# Patient Record
Sex: Female | Born: 1970 | Race: White | Hispanic: No | Marital: Single | Smoking: Former smoker
Health system: Southern US, Community
[De-identification: ages and names within clinical notes are randomized; demographics above are authoritative.]

## PROBLEM LIST (undated history)

## (undated) DIAGNOSIS — G259 Extrapyramidal and movement disorder, unspecified: Secondary | ICD-10-CM

## (undated) DIAGNOSIS — F1910 Other psychoactive substance abuse, uncomplicated: Secondary | ICD-10-CM

## (undated) DIAGNOSIS — F32A Depression, unspecified: Secondary | ICD-10-CM

## (undated) DIAGNOSIS — M1990 Unspecified osteoarthritis, unspecified site: Secondary | ICD-10-CM

## (undated) DIAGNOSIS — I10 Essential (primary) hypertension: Secondary | ICD-10-CM

## (undated) DIAGNOSIS — Q438 Other specified congenital malformations of intestine: Secondary | ICD-10-CM

## (undated) DIAGNOSIS — F4310 Post-traumatic stress disorder, unspecified: Secondary | ICD-10-CM

## (undated) DIAGNOSIS — G4730 Sleep apnea, unspecified: Secondary | ICD-10-CM

## (undated) DIAGNOSIS — G2581 Restless legs syndrome: Secondary | ICD-10-CM

## (undated) DIAGNOSIS — M797 Fibromyalgia: Secondary | ICD-10-CM

## (undated) DIAGNOSIS — R519 Headache, unspecified: Secondary | ICD-10-CM

## (undated) DIAGNOSIS — K2270 Barrett's esophagus without dysplasia: Secondary | ICD-10-CM

## (undated) DIAGNOSIS — R0600 Dyspnea, unspecified: Secondary | ICD-10-CM

## (undated) DIAGNOSIS — K760 Fatty (change of) liver, not elsewhere classified: Secondary | ICD-10-CM

## (undated) DIAGNOSIS — Q600 Renal agenesis, unilateral: Secondary | ICD-10-CM

## (undated) DIAGNOSIS — E039 Hypothyroidism, unspecified: Secondary | ICD-10-CM

## (undated) DIAGNOSIS — N183 Chronic kidney disease, stage 3 unspecified: Secondary | ICD-10-CM

## (undated) DIAGNOSIS — J449 Chronic obstructive pulmonary disease, unspecified: Secondary | ICD-10-CM

## (undated) DIAGNOSIS — K219 Gastro-esophageal reflux disease without esophagitis: Secondary | ICD-10-CM

## (undated) DIAGNOSIS — M5431 Sciatica, right side: Secondary | ICD-10-CM

## (undated) DIAGNOSIS — R9389 Abnormal findings on diagnostic imaging of other specified body structures: Secondary | ICD-10-CM

## (undated) DIAGNOSIS — R7303 Prediabetes: Secondary | ICD-10-CM

## (undated) DIAGNOSIS — M479 Spondylosis, unspecified: Secondary | ICD-10-CM

## (undated) HISTORY — DX: Spondylosis, unspecified: M47.9

## (undated) HISTORY — DX: Hypothyroidism, unspecified: E03.9

## (undated) HISTORY — DX: Post-traumatic stress disorder, unspecified: F43.10

## (undated) HISTORY — DX: Barrett's esophagus without dysplasia: K22.70

## (undated) HISTORY — PX: CARPAL TUNNEL RELEASE: SHX101

## (undated) HISTORY — DX: Essential (primary) hypertension: I10

## (undated) HISTORY — DX: Chronic kidney disease, stage 3 unspecified: N18.30

## (undated) HISTORY — PX: SHOULDER SURGERY: SHX246

## (undated) HISTORY — PX: TUBAL LIGATION: SHX77

## (undated) HISTORY — DX: Abnormal findings on diagnostic imaging of other specified body structures: R93.89

## (undated) HISTORY — DX: Unspecified osteoarthritis, unspecified site: M19.90

## (undated) HISTORY — DX: Extrapyramidal and movement disorder, unspecified: G25.9

## (undated) HISTORY — PX: ECTOPIC PREGNANCY SURGERY: SHX613

## (undated) HISTORY — DX: Other specified congenital malformations of intestine: Q43.8

---

## 2003-12-07 ENCOUNTER — Emergency Department (HOSPITAL_COMMUNITY): Admission: EM | Admit: 2003-12-07 | Discharge: 2003-12-07 | Payer: Self-pay | Admitting: Emergency Medicine

## 2004-04-22 ENCOUNTER — Emergency Department (HOSPITAL_COMMUNITY): Admission: EM | Admit: 2004-04-22 | Discharge: 2004-04-22 | Payer: Self-pay | Admitting: Emergency Medicine

## 2004-05-27 ENCOUNTER — Emergency Department (HOSPITAL_COMMUNITY): Admission: EM | Admit: 2004-05-27 | Discharge: 2004-05-27 | Payer: Self-pay | Admitting: *Deleted

## 2005-01-31 ENCOUNTER — Emergency Department (HOSPITAL_COMMUNITY): Admission: EM | Admit: 2005-01-31 | Discharge: 2005-01-31 | Payer: Self-pay | Admitting: *Deleted

## 2005-08-12 ENCOUNTER — Emergency Department (HOSPITAL_COMMUNITY): Admission: EM | Admit: 2005-08-12 | Discharge: 2005-08-12 | Payer: Self-pay | Admitting: Emergency Medicine

## 2005-10-01 ENCOUNTER — Emergency Department (HOSPITAL_COMMUNITY): Admission: EM | Admit: 2005-10-01 | Discharge: 2005-10-01 | Payer: Self-pay | Admitting: Emergency Medicine

## 2006-01-19 ENCOUNTER — Emergency Department (HOSPITAL_COMMUNITY): Admission: EM | Admit: 2006-01-19 | Discharge: 2006-01-19 | Payer: Self-pay | Admitting: Emergency Medicine

## 2006-01-26 ENCOUNTER — Emergency Department (HOSPITAL_COMMUNITY): Admission: EM | Admit: 2006-01-26 | Discharge: 2006-01-26 | Payer: Self-pay | Admitting: Emergency Medicine

## 2009-10-24 ENCOUNTER — Emergency Department (HOSPITAL_BASED_OUTPATIENT_CLINIC_OR_DEPARTMENT_OTHER): Admission: EM | Admit: 2009-10-24 | Discharge: 2009-10-24 | Payer: Self-pay | Admitting: Emergency Medicine

## 2017-05-26 ENCOUNTER — Encounter (HOSPITAL_COMMUNITY): Payer: Self-pay | Admitting: *Deleted

## 2017-05-26 ENCOUNTER — Emergency Department (HOSPITAL_COMMUNITY)
Admission: EM | Admit: 2017-05-26 | Discharge: 2017-05-26 | Disposition: A | Discharge status: Home / Self Care | Payer: Self-pay | Attending: Emergency Medicine | Admitting: Emergency Medicine

## 2017-05-26 ENCOUNTER — Emergency Department (HOSPITAL_COMMUNITY): Payer: Self-pay

## 2017-05-26 DIAGNOSIS — S93491A Sprain of other ligament of right ankle, initial encounter: Secondary | ICD-10-CM

## 2017-05-26 DIAGNOSIS — Y929 Unspecified place or not applicable: Secondary | ICD-10-CM | POA: Insufficient documentation

## 2017-05-26 DIAGNOSIS — S93431A Sprain of tibiofibular ligament of right ankle, initial encounter: Secondary | ICD-10-CM | POA: Insufficient documentation

## 2017-05-26 DIAGNOSIS — X509XXA Other and unspecified overexertion or strenuous movements or postures, initial encounter: Secondary | ICD-10-CM | POA: Insufficient documentation

## 2017-05-26 DIAGNOSIS — Y999 Unspecified external cause status: Secondary | ICD-10-CM | POA: Insufficient documentation

## 2017-05-26 DIAGNOSIS — Y9389 Activity, other specified: Secondary | ICD-10-CM | POA: Insufficient documentation

## 2017-05-26 HISTORY — DX: Restless legs syndrome: G25.81

## 2017-05-26 HISTORY — DX: Sciatica, right side: M54.31

## 2017-05-26 HISTORY — DX: Renal agenesis, unilateral: Q60.0

## 2017-05-26 HISTORY — DX: Gastro-esophageal reflux disease without esophagitis: K21.9

## 2017-05-26 MED ORDER — TRAMADOL HCL 50 MG PO TABS
50.0000 mg | ORAL_TABLET | Freq: Once | ORAL | Status: AC
  Administered 2017-05-26: 50 mg via ORAL
  Filled 2017-05-26: qty 1

## 2017-05-26 MED ORDER — TRAMADOL HCL 50 MG PO TABS: 50.0000 mg | ORAL_TABLET | Freq: Four times a day (QID) | ORAL | 0 refills | Status: DC | PRN

## 2017-05-26 NOTE — ED Triage Notes (Addendum)
Pt c/o right ankle pain that occurred today. Pt was moving stuff and stepped wrong and her ankle rolled out. Obvious swelling noted to right ankle.

## 2017-05-26 NOTE — ED Provider Notes (Signed)
[facility] DEPT Provider Note   CSN: [id number] Arrival date & time: 05/26/17  1349     History   Chief Complaint Chief Complaint  Patient presents with  . Ankle Pain    HPI Briana Wyatt is a 46 y.o. female.  HPI   46 year old female presenting for evaluation of right ankle injury. Patient states today she was moving stuff and stepped wrong and her right ankle rolled out.she did fell down the ground but denies hitting head or loss of consciousness. She is complaining of significant pain to her right ankle, described as sharp, radiates towards her calf, worse with movement. Reports tingling sensation to her toes. Does report some mild right knee pain but no hip pain. She applied ice and came here.  She has sprained the same ankle in the past.    Past Medical History:  Diagnosis Date  . Acid reflux   . Congenital single kidney    absent left  . Restless leg syndrome   . Sciatica of right side     There are no active problems to display for this patient.   Past Surgical History:  Procedure Laterality Date  . ECTOPIC PREGNANCY SURGERY    . TUBAL LIGATION      OB History    No data available       Home Medications    Prior to Admission medications   Not on File    Family History No family history on file.  Social History Social History  Substance Use Topics  . Smoking status: Never Smoker  . Smokeless tobacco: Never Used  . Alcohol use No     Allergies   Codeine and Robaxin [methocarbamol]   Review of Systems Review of Systems  Musculoskeletal: Positive for arthralgias and joint swelling.  Skin: Negative for rash and wound.  Neurological: Negative for numbness.     Physical Exam Updated Vital Signs BP 116/68   Pulse 74   Temp 98.1 F (36.7 C) (Oral)   Resp 20   Ht 5\' 6"  (1.676 m)   Wt 78 kg (172 lb)   SpO2 97%   BMI 27.76 kg/m   Physical Exam  Constitutional: She appears well-developed and well-nourished. No distress.    HENT:  Head: Atraumatic.  Eyes: Conjunctivae are normal.  Neck: Neck supple.  Musculoskeletal: She exhibits tenderness (right ankle: Moderate edema noted to lateral malleolus region with tenderness to palpation but no crepitus. Decreased ankle range of motion secondary to pain. Dorsalis pedis pulse palpable, no pain at fifth metatarsal region.).  Right knee: Mild tenderness to posterior fossa with normal knee flexion and extension and no deformity.  Neurological: She is alert.  Skin: No rash noted.  Psychiatric: She has a normal mood and affect.  Nursing note and vitals reviewed.    ED Treatments / Results  Labs (all labs ordered are listed, but only abnormal results are displayed) Labs Reviewed - No data to display  EKG  EKG Interpretation None       Radiology Dg Ankle Complete Right  Result Date: 05/26/2017 CLINICAL DATA:  Twisted ankle today.  Lateral ankle pain. EXAM: RIGHT ANKLE - COMPLETE 3+ VIEW COMPARISON:  05/26/2013 FINDINGS: The ankle mortise is maintained. No acute ankle fracture or osteochondral lesion. There is a remote rounded avulsion fracture involving the distal tip of the lateral malleolus. The visualized mid and hindfoot bony structures are intact. Large os perineum noted. IMPRESSION: No acute ankle fracture. Electronically Signed   By: Marijo Sanes  M.D.   On: 05/26/2017 14:38    Procedures Procedures (including critical care time)  Medications Ordered in ED Medications - No data to display   Initial Impression / Assessment and Plan / ED Course  I have reviewed the triage vital signs and the nursing notes.  Pertinent labs & imaging results that were available during my care of the patient were reviewed by me and considered in my medical decision making (see chart for details).     BP 116/68   Pulse 74   Temp 98.1 F (36.7 C) (Oral)   Resp 20   Ht 5\' 6"  (1.676 m)   Wt 78 kg (172 lb)   SpO2 97%   BMI 27.76 kg/m    Final Clinical  Impressions(s) / ED Diagnoses   Final diagnoses:  Sprain of anterior talofibular ligament of right ankle, initial encounter    New Prescriptions New Prescriptions   TRAMADOL (ULTRAM) 50 MG TABLET    Take 1 tablet (50 mg total) by mouth every 6 (six) hours as needed.   2:52 PM Patient had a mechanical injury and injured her right ankle. X-ray without acute fractures or dislocation. She suffer from a sprain. She is neurovascularly intact. Will apply ASO brace, crutches, rice therapy discussed, and orthopedic referral given as needed. Since pt is allergic to NSAIDs, tramadol were prescribed. In order to decrease risk of narcotic abuse. Pt's record were checked using the [place] Controlled Substance database.     Domenic Moras, PA-C 05/26/17 1457    Nat Christen, MD 05/29/17 [phone]

## 2020-09-14 ENCOUNTER — Other Ambulatory Visit: Payer: Self-pay

## 2020-09-14 ENCOUNTER — Emergency Department (HOSPITAL_COMMUNITY): Payer: Medicaid - Out of State

## 2020-09-14 ENCOUNTER — Emergency Department (HOSPITAL_COMMUNITY)
Admission: EM | Admit: 2020-09-14 | Discharge: 2020-09-14 | Disposition: A | Discharge status: Home / Self Care | Payer: Medicaid - Out of State | Attending: Emergency Medicine | Admitting: Emergency Medicine

## 2020-09-14 ENCOUNTER — Encounter (HOSPITAL_COMMUNITY): Payer: Self-pay

## 2020-09-14 DIAGNOSIS — F1494 Cocaine use, unspecified with cocaine-induced mood disorder: Secondary | ICD-10-CM | POA: Diagnosis not present

## 2020-09-14 DIAGNOSIS — Z20822 Contact with and (suspected) exposure to covid-19: Secondary | ICD-10-CM | POA: Diagnosis not present

## 2020-09-14 DIAGNOSIS — R440 Auditory hallucinations: Secondary | ICD-10-CM | POA: Diagnosis not present

## 2020-09-14 DIAGNOSIS — Z046 Encounter for general psychiatric examination, requested by authority: Secondary | ICD-10-CM | POA: Diagnosis present

## 2020-09-14 LAB — COMPREHENSIVE METABOLIC PANEL
ALT: 14 U/L (ref 0–44)
AST: 13 U/L — ABNORMAL LOW (ref 15–41)
Albumin: 2.9 g/dL — ABNORMAL LOW (ref 3.5–5.0)
Alkaline Phosphatase: 106 U/L (ref 38–126)
Anion gap: 6 (ref 5–15)
BUN: 18 mg/dL (ref 6–20)
CO2: 24 mmol/L (ref 22–32)
Calcium: 8.2 mg/dL — ABNORMAL LOW (ref 8.9–10.3)
Chloride: 108 mmol/L (ref 98–111)
Creatinine, Ser: 0.89 mg/dL (ref 0.44–1.00)
GFR, Estimated: >60 mL/min (ref >60)
Glucose, Bld: 121 mg/dL — ABNORMAL HIGH (ref 70–99)
Potassium: 3.8 mmol/L (ref 3.5–5.1)
Sodium: 138 mmol/L (ref 135–145)
Total Bilirubin: <0.1 mg/dL — ABNORMAL LOW (ref 0.3–1.2)
Total Protein: 6.1 g/dL — ABNORMAL LOW (ref 6.5–8.1)

## 2020-09-14 LAB — RESP PANEL BY RT-PCR (FLU A&B, COVID) ARPGX2
Influenza A by PCR: NEGATIVE
Influenza B by PCR: NEGATIVE
SARS Coronavirus 2 by RT PCR: NEGATIVE

## 2020-09-14 LAB — CBC WITH DIFFERENTIAL/PLATELET
Abs Immature Granulocytes: 0.04 10*3/uL (ref 0.00–0.07)
Basophils Absolute: 0 10*3/uL (ref 0.0–0.1)
Basophils Relative: 1 %
Eosinophils Absolute: 0.3 10*3/uL (ref 0.0–0.5)
Eosinophils Relative: 5 %
HCT: 36.2 % (ref 36.0–46.0)
Hemoglobin: 11.5 g/dL — ABNORMAL LOW (ref 12.0–15.0)
Immature Granulocytes: 1 %
Lymphocytes Relative: 32 %
Lymphs Abs: 2.1 10*3/uL (ref 0.7–4.0)
MCH: 28.5 pg (ref 26.0–34.0)
MCHC: 31.8 g/dL (ref 30.0–36.0)
MCV: 89.8 fL (ref 80.0–100.0)
Monocytes Absolute: 0.7 10*3/uL (ref 0.1–1.0)
Monocytes Relative: 11 %
Neutro Abs: 3.4 10*3/uL (ref 1.7–7.7)
Neutrophils Relative %: 50 %
Platelets: 260 10*3/uL (ref 150–400)
RBC: 4.03 MIL/uL (ref 3.87–5.11)
RDW: 13.6 % (ref 11.5–15.5)
WBC: 6.5 10*3/uL (ref 4.0–10.5)
nRBC: 0 % (ref 0.0–0.2)

## 2020-09-14 LAB — RAPID URINE DRUG SCREEN, HOSP PERFORMED
Amphetamines: NOT DETECTED
Barbiturates: NOT DETECTED
Benzodiazepines: POSITIVE — AB
Cocaine: POSITIVE — AB
Opiates: NOT DETECTED
Tetrahydrocannabinol: NOT DETECTED

## 2020-09-14 LAB — ETHANOL: Alcohol, Ethyl (B): <10 mg/dL (ref <10)

## 2020-09-14 LAB — POC URINE PREG, ED: Preg Test, Ur: NEGATIVE

## 2020-09-14 MED ORDER — GABAPENTIN 100 MG PO CAPS
200.0000 mg | ORAL_CAPSULE | Freq: Two times a day (BID) | ORAL | Status: DC
  Administered 2020-09-14: 200 mg via ORAL
  Filled 2020-09-14: qty 2

## 2020-09-14 NOTE — ED Triage Notes (Signed)
Pt brought in tonight by [employer], officer Hutchins, with IVC paperwork taken out by pt mother, for hearing voices that are telling her to kill herself. Pt admits to hearing these voices.

## 2020-09-14 NOTE — ED Notes (Signed)
Pt resting comfortably in hall. Safety sitter at bedside.

## 2020-09-14 NOTE — ED Provider Notes (Addendum)
[facility] Emergency Department Provider Note MRN:  [id number]  Arrival date & time: 09/14/20     Chief Complaint   IVC   History of Present Illness   Briana Wyatt is a 50 y.o. year-old female with  presenting to the ED with chief complaint of IVC.  Patient is hearing voices coming out of her mother's ceiling fan.  The voices are telling her to kill her self, with specific plan to turn on the car and die due to carbon monoxide poisoning.  She denies any drugs or alcohol today.  Here under IVC.  Review of Systems  A complete 10 system review of systems was obtained and all systems are negative except as noted in the HPI and PMH.   Patient's Health History    Past Medical History:  Diagnosis Date  . Acid reflux   . Congenital single kidney    absent left  . Restless leg syndrome   . Sciatica of right side     Past Surgical History:  Procedure Laterality Date  . ECTOPIC PREGNANCY SURGERY    . TUBAL LIGATION      No family history on file.  Social History   Socioeconomic History  . Marital status: Single    Spouse name: Not on file  . Number of children: Not on file  . Years of education: Not on file  . Highest education level: Not on file  Occupational History  . Not on file  Tobacco Use  . Smoking status: Never Smoker  . Smokeless tobacco: Never Used  Substance and Sexual Activity  . Alcohol use: No  . Drug use: No  . Sexual activity: Not on file  Other Topics Concern  . Not on file  Social History Narrative  . Not on file   Social Determinants of Health   Financial Resource Strain: Not on file  Food Insecurity: Not on file  Transportation Needs: Not on file  Physical Activity: Not on file  Stress: Not on file  Social Connections: Not on file  Intimate Partner Violence: Not on file     Physical Exam   Vitals:   09/14/20 0412  BP: 118/67  Pulse: 95  Resp: 18  Temp: 97.7 F (36.5 C)  SpO2: 99%    CONSTITUTIONAL:  Well-appearing, NAD NEURO:  Alert and oriented x 3, no focal deficits EYES:  eyes equal and reactive ENT/NECK:  no LAD, no JVD CARDIO: Regular rate, well-perfused, normal S1 and S2 PULM:  CTAB no wheezing or rhonchi GI/GU:  normal bowel sounds, non-distended, non-tender MSK/SPINE:  No gross deformities, no edema SKIN:  no rash, atraumatic PSYCH: Erratic speech and behavior  *Additional and/or pertinent findings included in MDM below  Diagnostic and Interventional Summary    EKG Interpretation  Date/Time:    Ventricular Rate:    PR Interval:    QRS Duration:   QT Interval:    QTC Calculation:   R Axis:     Text Interpretation:        Labs Reviewed  RESP PANEL BY RT-PCR (FLU A&B, COVID) [id number]  COMPREHENSIVE METABOLIC PANEL  ETHANOL  RAPID URINE DRUG SCREEN, HOSP PERFORMED  CBC WITH DIFFERENTIAL/PLATELET  POC URINE PREG, ED    No orders to display    Medications - No data to display   Procedures  /  Critical Care Procedures  ED Course and Medical Decision Making  I have reviewed the triage vital signs, the nursing notes, and pertinent available records  from the EMR.  Listed above are laboratory and imaging tests that I personally ordered, reviewed, and interpreted and then considered in my medical decision making (see below for details).  Concern for acute psychosis, hallucinations.  Under IVC, awaiting TTS recommendations.  No prior documentation of psychiatric illness, 50 years old, will obtain screening labs as well as CT head.  If unremarkable patient would be medically cleared.  Signed out to oncoming provider at shift change.       Barth Kirks. Sedonia Small, [place] [email or web address]  Final Clinical Impressions(s) / ED Diagnoses     ICD-10-CM   1. Auditory hallucinations  R44.0     ED Discharge Orders    None       Discharge Instructions Discussed with and Provided to Patient:   Discharge  Instructions   None       Maudie Flakes, MD 09/14/20 0102    Maudie Flakes, MD 09/14/20 [phone]
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
## 2020-09-14 NOTE — ED Notes (Signed)
Breakfast tray provided. 

## 2020-09-14 NOTE — ED Notes (Signed)
Safety sitter with pt at bedside. Pt calm, cooperative.

## 2020-09-14 NOTE — ED Notes (Signed)
Pt belongings (clothes, shoes and cell phone) placed in bag and locked up.

## 2020-09-14 NOTE — ED Notes (Signed)
No needs at this time. Resting in hall with Air cabin crew.

## 2020-09-14 NOTE — ED Provider Notes (Signed)
Care transferred to me.  Labs have been reviewed and are overall unremarkable besides toxicologic results.  CT head was negative.  At this point, this seems to be a psychiatric issue and per Dr. Sedonia Small, patient's mom indicates she has bipolar.  Patient appears medically stable for psychiatric disposition.   Sherwood Gambler, MD 09/14/20 [phone]

## 2020-09-14 NOTE — ED Provider Notes (Signed)
Patient seen and cleared by Psychiatry, IVC rescinded. Patient amenable to discharge with outpatient follow up.    Truddie Hidden, MD 09/14/20 [phone]

## 2020-09-16 ENCOUNTER — Other Ambulatory Visit: Payer: Self-pay

## 2020-09-16 ENCOUNTER — Emergency Department (HOSPITAL_COMMUNITY)
Admission: EM | Admit: 2020-09-16 | Discharge: 2020-09-17 | Disposition: A | Discharge status: Other Acute Inpatient Hospital | Payer: Medicaid - Out of State | Attending: Emergency Medicine | Admitting: Emergency Medicine

## 2020-09-16 ENCOUNTER — Encounter (HOSPITAL_COMMUNITY): Payer: Self-pay | Admitting: *Deleted

## 2020-09-16 DIAGNOSIS — Z046 Encounter for general psychiatric examination, requested by authority: Secondary | ICD-10-CM | POA: Diagnosis present

## 2020-09-16 DIAGNOSIS — F1494 Cocaine use, unspecified with cocaine-induced mood disorder: Secondary | ICD-10-CM | POA: Diagnosis not present

## 2020-09-16 DIAGNOSIS — F22 Delusional disorders: Secondary | ICD-10-CM | POA: Insufficient documentation

## 2020-09-16 DIAGNOSIS — R45851 Suicidal ideations: Secondary | ICD-10-CM | POA: Insufficient documentation

## 2020-09-16 DIAGNOSIS — Z20822 Contact with and (suspected) exposure to covid-19: Secondary | ICD-10-CM | POA: Insufficient documentation

## 2020-09-16 DIAGNOSIS — F419 Anxiety disorder, unspecified: Secondary | ICD-10-CM | POA: Diagnosis not present

## 2020-09-16 LAB — COMPREHENSIVE METABOLIC PANEL
ALT: 14 U/L (ref 0–44)
AST: 15 U/L (ref 15–41)
Albumin: 3.1 g/dL — ABNORMAL LOW (ref 3.5–5.0)
Alkaline Phosphatase: 115 U/L (ref 38–126)
Anion gap: 11 (ref 5–15)
BUN: 16 mg/dL (ref 6–20)
CO2: 23 mmol/L (ref 22–32)
Calcium: 9 mg/dL (ref 8.9–10.3)
Chloride: 105 mmol/L (ref 98–111)
Creatinine, Ser: 0.96 mg/dL (ref 0.44–1.00)
GFR, Estimated: >60 mL/min (ref >60)
Glucose, Bld: 100 mg/dL — ABNORMAL HIGH (ref 70–99)
Potassium: 4.4 mmol/L (ref 3.5–5.1)
Sodium: 139 mmol/L (ref 135–145)
Total Bilirubin: 0.3 mg/dL (ref 0.3–1.2)
Total Protein: 6.5 g/dL (ref 6.5–8.1)

## 2020-09-16 LAB — ACETAMINOPHEN LEVEL: Acetaminophen (Tylenol), Serum: <10 ug/mL — ABNORMAL LOW (ref 10–30)

## 2020-09-16 LAB — RAPID URINE DRUG SCREEN, HOSP PERFORMED
Amphetamines: NOT DETECTED
Barbiturates: NOT DETECTED
Benzodiazepines: POSITIVE — AB
Cocaine: NOT DETECTED
Opiates: NOT DETECTED
Tetrahydrocannabinol: NOT DETECTED

## 2020-09-16 LAB — CBC
HCT: 39.1 % (ref 36.0–46.0)
Hemoglobin: 12.6 g/dL (ref 12.0–15.0)
MCH: 28.4 pg (ref 26.0–34.0)
MCHC: 32.2 g/dL (ref 30.0–36.0)
MCV: 88.3 fL (ref 80.0–100.0)
Platelets: 290 10*3/uL (ref 150–400)
RBC: 4.43 MIL/uL (ref 3.87–5.11)
RDW: 13.3 % (ref 11.5–15.5)
WBC: 9.2 10*3/uL (ref 4.0–10.5)
nRBC: 0 % (ref 0.0–0.2)

## 2020-09-16 LAB — SALICYLATE LEVEL: Salicylate Lvl: <7 mg/dL — ABNORMAL LOW (ref 7.0–30.0)

## 2020-09-16 LAB — ETHANOL: Alcohol, Ethyl (B): <10 mg/dL (ref <10)

## 2020-09-16 LAB — I-STAT BETA HCG BLOOD, ED (MC, WL, AP ONLY): I-stat hCG, quantitative: <5 m[IU]/mL (ref <5)

## 2020-09-16 MED ORDER — LOSARTAN POTASSIUM 50 MG PO TABS: 25.0000 mg | ORAL_TABLET | Freq: Every day | ORAL | Status: DC

## 2020-09-16 MED ORDER — CYPROHEPTADINE HCL 4 MG PO TABS
4.0000 mg | ORAL_TABLET | Freq: Every day | ORAL | Status: DC
  Administered 2020-09-16: 4 mg via ORAL
  Filled 2020-09-16: qty 1

## 2020-09-16 MED ORDER — BACLOFEN 10 MG PO TABS
10.0000 mg | ORAL_TABLET | Freq: Two times a day (BID) | ORAL | Status: DC
  Administered 2020-09-16: 10 mg via ORAL
  Filled 2020-09-16: qty 1

## 2020-09-16 MED ORDER — CARBAMAZEPINE 200 MG PO TABS
200.0000 mg | ORAL_TABLET | Freq: Every day | ORAL | Status: DC
Start: 2020-09-16 — End: 2020-09-17
  Administered 2020-09-16: 200 mg via ORAL
  Filled 2020-09-16: qty 1

## 2020-09-16 MED ORDER — PANTOPRAZOLE SODIUM 40 MG PO TBEC: 40.0000 mg | DELAYED_RELEASE_TABLET | Freq: Every day | ORAL | Status: DC

## 2020-09-16 MED ORDER — CELECOXIB 100 MG PO CAPS
100.0000 mg | ORAL_CAPSULE | Freq: Two times a day (BID) | ORAL | Status: DC
Start: 2020-09-16 — End: 2020-09-17
  Administered 2020-09-16: 100 mg via ORAL
  Filled 2020-09-16: qty 1

## 2020-09-16 MED ORDER — PREGABALIN 50 MG PO CAPS
100.0000 mg | ORAL_CAPSULE | Freq: Two times a day (BID) | ORAL | Status: DC
Start: 2020-09-16 — End: 2020-09-17
  Administered 2020-09-16: 100 mg via ORAL
  Filled 2020-09-16: qty 1

## 2020-09-16 MED ORDER — DULOXETINE HCL 60 MG PO CPEP
60.0000 mg | ORAL_CAPSULE | Freq: Two times a day (BID) | ORAL | Status: DC
  Administered 2020-09-16: 60 mg via ORAL
  Filled 2020-09-16: qty 1

## 2020-09-16 NOTE — ED Triage Notes (Signed)
Pt was seen at [facility] on 1/22 for same. Reports hearing a voice that is telling her to harm herself but she did not act on it. Pt wants somewhere to stay for a few days and get meds balanced and feeling better. Reports high anxiety and stress. Cooperative at triage.

## 2020-09-17 ENCOUNTER — Encounter (HOSPITAL_COMMUNITY): Payer: Self-pay

## 2020-09-17 ENCOUNTER — Other Ambulatory Visit: Payer: Self-pay

## 2020-09-17 ENCOUNTER — Ambulatory Visit (HOSPITAL_COMMUNITY)
Admission: EM | Admit: 2020-09-17 | Discharge: 2020-09-17 | Disposition: A | Discharge status: Home / Self Care | Payer: Medicaid - Out of State | Source: Home / Self Care

## 2020-09-17 DIAGNOSIS — F331 Major depressive disorder, recurrent, moderate: Secondary | ICD-10-CM

## 2020-09-17 DIAGNOSIS — Z20822 Contact with and (suspected) exposure to covid-19: Secondary | ICD-10-CM | POA: Insufficient documentation

## 2020-09-17 DIAGNOSIS — Z6379 Other stressful life events affecting family and household: Secondary | ICD-10-CM | POA: Insufficient documentation

## 2020-09-17 DIAGNOSIS — R45851 Suicidal ideations: Secondary | ICD-10-CM | POA: Insufficient documentation

## 2020-09-17 DIAGNOSIS — Z560 Unemployment, unspecified: Secondary | ICD-10-CM | POA: Insufficient documentation

## 2020-09-17 DIAGNOSIS — F4323 Adjustment disorder with mixed anxiety and depressed mood: Secondary | ICD-10-CM

## 2020-09-17 DIAGNOSIS — Z9151 Personal history of suicidal behavior: Secondary | ICD-10-CM | POA: Insufficient documentation

## 2020-09-17 DIAGNOSIS — Z79899 Other long term (current) drug therapy: Secondary | ICD-10-CM | POA: Insufficient documentation

## 2020-09-17 LAB — LIPID PANEL
Cholesterol: 213 mg/dL — ABNORMAL HIGH (ref 0–200)
HDL: 67 mg/dL (ref >40)
LDL Cholesterol: 107 mg/dL — ABNORMAL HIGH (ref 0–99)
Total CHOL/HDL Ratio: 3.2 RATIO
Triglycerides: 195 mg/dL — ABNORMAL HIGH (ref <150)
VLDL: 39 mg/dL (ref 0–40)

## 2020-09-17 LAB — SARS CORONAVIRUS 2 BY RT PCR (HOSPITAL ORDER, PERFORMED IN ~~LOC~~ HOSPITAL LAB): SARS Coronavirus 2: NEGATIVE

## 2020-09-17 LAB — TSH: TSH: 0.807 u[IU]/mL (ref 0.350–4.500)

## 2020-09-17 LAB — CARBAMAZEPINE LEVEL, TOTAL: Carbamazepine Lvl: 5.5 ug/mL (ref 4.0–12.0)

## 2020-09-17 LAB — HEMOGLOBIN A1C
Hgb A1c MFr Bld: 5.9 % — ABNORMAL HIGH (ref 4.8–5.6)
Mean Plasma Glucose: 122.63 mg/dL

## 2020-09-17 MED ORDER — CARBAMAZEPINE 200 MG PO TABS: 200.0000 mg | ORAL_TABLET | Freq: Every day | ORAL | Status: DC

## 2020-09-17 MED ORDER — CARBAMAZEPINE 200 MG PO TABS: 200.0000 mg | ORAL_TABLET | Freq: Two times a day (BID) | ORAL | 0 refills | Status: DC

## 2020-09-17 MED ORDER — CARBAMAZEPINE 200 MG PO TABS
200.0000 mg | ORAL_TABLET | Freq: Two times a day (BID) | ORAL | Status: DC
  Administered 2020-09-17: 200 mg via ORAL
  Filled 2020-09-17: qty 14
  Filled 2020-09-17: qty 1

## 2020-09-17 MED ORDER — DULOXETINE HCL 60 MG PO CPEP
60.0000 mg | ORAL_CAPSULE | Freq: Two times a day (BID) | ORAL | Status: DC
  Administered 2020-09-17: 60 mg via ORAL
  Filled 2020-09-17 (×2): qty 1

## 2020-09-17 MED ORDER — MAGNESIUM HYDROXIDE 400 MG/5ML PO SUSP: 30.0000 mL | Freq: Every day | ORAL | Status: DC | PRN

## 2020-09-17 MED ORDER — PREGABALIN 100 MG PO CAPS
100.0000 mg | ORAL_CAPSULE | Freq: Two times a day (BID) | ORAL | Status: DC
  Administered 2020-09-17: 100 mg via ORAL
  Filled 2020-09-17 (×2): qty 1

## 2020-09-17 MED ORDER — LOSARTAN POTASSIUM 50 MG PO TABS
25.0000 mg | ORAL_TABLET | Freq: Every day | ORAL | Status: DC
  Administered 2020-09-17: 25 mg via ORAL
  Filled 2020-09-17: qty 1

## 2020-09-17 MED ORDER — ACETAMINOPHEN 325 MG PO TABS: 650.0000 mg | ORAL_TABLET | Freq: Four times a day (QID) | ORAL | Status: DC | PRN

## 2020-09-17 MED ORDER — LORATADINE 10 MG PO TABS
10.0000 mg | ORAL_TABLET | Freq: Every day | ORAL | Status: DC
  Administered 2020-09-17: 10 mg via ORAL
  Filled 2020-09-17: qty 1

## 2020-09-17 MED ORDER — BACLOFEN 10 MG PO TABS
10.0000 mg | ORAL_TABLET | Freq: Two times a day (BID) | ORAL | Status: DC
  Administered 2020-09-17: 10 mg via ORAL
  Filled 2020-09-17 (×2): qty 1

## 2020-09-17 MED ORDER — PANTOPRAZOLE SODIUM 40 MG PO TBEC
40.0000 mg | DELAYED_RELEASE_TABLET | Freq: Every day | ORAL | Status: DC
  Administered 2020-09-17: 40 mg via ORAL
  Filled 2020-09-17: qty 1

## 2020-09-17 MED ORDER — ALUM & MAG HYDROXIDE-SIMETH 200-200-20 MG/5ML PO SUSP: 30.0000 mL | ORAL | Status: DC | PRN
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
## 2020-09-17 NOTE — ED Notes (Signed)
Discharge instructions provided and Pt stated understanding. Prescriptions and samples given. Personal belongings returned from locker. Pt alert, orient and ambulatory. Safety maintained.

## 2020-09-17 NOTE — ED Notes (Signed)
Pt resting on pull out bed w/o s&s of distress. Safety maintained and will continue to monitor.
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
## 2020-09-17 NOTE — Discharge Instructions (Addendum)

## 2020-09-17 NOTE — ED Provider Notes (Signed)
[facility] EMERGENCY DEPARTMENT Provider Note   CSN: [id number] Arrival date & time: 09/16/20  1639     History Chief Complaint  Patient presents with  . Suicidal  . Medical Clearance    Briana Wyatt is a 50 y.o. female.  50 y/o female with hx of restless leg syndrome, anxiety presents to the ED for psychiatric evaluation. IVC taken out 3 days ago for hallucinations, worsening anxiety. She was discharged with instruction for outpatient follow up, but has not yet reached out to a local provider. Recently relocated to the area 1 month ago from [place]. Has a therapist and psychiatrist there. Patient endorses increased stress lately which is causing her to feel more anxious and have "outbursts". She thinks something bad is going to happen. Patient has been taking out her frustrations on her mother; she does not want this to escalate to the point where she harms anyone though she denies HI or plan. Does endorse suicidal ideations without plan; states she would be too scared to harm herself at the risk of being unsuccessful. No hallucinations since last seen in the ED. Feels she needs to have her current medications evaluated and be stabilized in a hospital setting.        Past Medical History:  Diagnosis Date  . Acid reflux   . Congenital single kidney    absent left  . Restless leg syndrome   . Sciatica of right side     Patient Active Problem List   Diagnosis Date Noted  . 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 ECTOPIC PREGNANCY SURGERY    . TUBAL LIGATION       OB History   No obstetric history on file.     History reviewed. No pertinent family history.  Social History   Tobacco Use  . Smoking status: Never Smoker  . Smokeless tobacco: Never Used  Substance Use Topics  . Alcohol use: No  . Drug use: No    Home Medications Prior to Admission medications   Medication Sig Start  Date End Date Taking? Authorizing Provider  baclofen (LIORESAL) 10 MG tablet Take 10 mg by mouth 2 (two) times daily.   Yes [provider]  carbamazepine (TEGRETOL) 200 MG tablet Take 200 mg by mouth at bedtime.   Yes [provider]  celecoxib (CELEBREX) 100 MG capsule Take 100 mg by mouth 2 (two) times daily.   Yes [provider]  cetirizine (ZYRTEC) 10 MG tablet Take 10 mg by mouth daily. 05/05/20  Yes [provider]  cyproheptadine (PERIACTIN) 4 MG tablet Take 4 mg by mouth at bedtime.   Yes [provider]  DULoxetine (CYMBALTA) 60 MG capsule Take 60 mg by mouth 2 (two) times daily. 02/15/19  Yes [provider]  estradiol-norethindrone (ACTIVELLA) 1-0.5 MG tablet Take 1 tablet by mouth at bedtime. 03/08/20  Yes [provider]  loratadine (CLARITIN) 10 MG tablet Take 10 mg by mouth daily.   Yes [provider]  losartan (COZAAR) 25 MG tablet Take 25 mg by mouth daily.   Yes [provider]  pantoprazole (PROTONIX) 40 MG tablet Take 40 mg by mouth daily.   Yes [provider]  pregabalin (LYRICA) 100 MG capsule Take 100 mg by mouth 2 (two) times daily. 04/15/20  Yes [provider]    Allergies    Codeine, Nsaids, and Robaxin [methocarbamol]  Review of Systems  Review of Systems  Ten systems reviewed and are negative for acute change, except as noted in the HPI.    Physical Exam Updated Vital Signs BP (!) 145/98   Pulse 81   Temp 98.1 F (36.7 C) (Oral)   Resp 18   SpO2 99%   Physical Exam Vitals and nursing note reviewed.  Constitutional:      General: She is not in acute distress.    Appearance: She is well-developed and well-nourished. She is not diaphoretic.     Comments: Nontoxic appearing and in NAD  HENT:     Head: Normocephalic and atraumatic.  Eyes:     General: No scleral icterus.    Extraocular Movements: EOM normal.     Conjunctiva/sclera: Conjunctivae normal.   Pulmonary:     Effort: Pulmonary effort is normal. No respiratory distress.     Comments: Respirations even and unlabored Musculoskeletal:        General: Normal range of motion.     Cervical back: Normal range of motion.  Skin:    General: Skin is warm and dry.     Coloration: Skin is not pale.     Findings: No erythema or rash.  Neurological:     Mental Status: She is alert and oriented to person, place, and time.  Psychiatric:        Mood and Affect: Mood is anxious.        Speech: Speech is rapid and pressured.        Behavior: Behavior is hyperactive. Behavior is cooperative.        Thought Content: Thought content is paranoid. Thought content includes suicidal ideation. Thought content does not include homicidal ideation. Thought content does not include homicidal or suicidal plan.     ED Results / Procedures / Treatments   Labs (all labs ordered are listed, but only abnormal results are displayed) Labs Reviewed  COMPREHENSIVE METABOLIC PANEL - Abnormal; Notable for the following components:      Result Value   Glucose, Bld 100 (*)    Albumin 3.1 (*)    All other components within normal limits  SALICYLATE LEVEL - Abnormal; Notable for the following components:   Salicylate Lvl <1.8 (*)    All other components within normal limits  ACETAMINOPHEN LEVEL - Abnormal; Notable for the following components:   Acetaminophen (Tylenol), Serum <10 (*)    All other components within normal limits  RAPID URINE DRUG SCREEN, HOSP PERFORMED - Abnormal; Notable for the following components:   Benzodiazepines POSITIVE (*)    All other components within normal limits  SARS CORONAVIRUS 2 BY RT PCR (HOSPITAL ORDER, [facility])  ETHANOL  CBC  I-STAT BETA HCG BLOOD, ED ([facility], [facility], [facility] ONLY)    EKG None  Radiology No results found.  Procedures Procedures   Medications Ordered in ED Medications  baclofen (LIORESAL) tablet 10 mg (10 mg Oral Given 09/16/20  2245)  carbamazepine (TEGRETOL) tablet 200 mg (200 mg Oral Given 09/16/20 2257)  celecoxib (CELEBREX) capsule 100 mg (100 mg Oral Given 09/16/20 2246)  cyproheptadine (PERIACTIN) 4 MG tablet 4 mg (4 mg Oral Given 09/16/20 2246)  DULoxetine (CYMBALTA) DR capsule 60 mg (60 mg Oral Given 09/16/20 2246)  pregabalin (LYRICA) capsule 100 mg (100 mg Oral Given 09/16/20 2258)  losartan (COZAAR) tablet 25 mg (has no administration in time range)  pantoprazole (PROTONIX) EC tablet 40 mg (has no administration in time range)    ED Course  I have reviewed the triage vital signs and the nursing notes.  Pertinent labs & imaging results that were available during my care of the patient were reviewed by me and considered in my medical decision making (see chart for details).  Clinical Course as of 09/17/20 0039  Tue Sep 17, 2020  0028 Spoke with Lindon Romp, NP who has accepted the patient at [facility] for evaluation. [KH]    Clinical Course User Index [KH] Antonietta Breach, PA-C   MDM Rules/Calculators/A&P                          Patient medically cleared and accepted at behavioral health urgent care for ongoing psychiatric assessment.  Case discussed with Lindon Romp, NP provider. Patient voluntary and agreeable to transfer.   Final Clinical Impression(s) / ED Diagnoses Final diagnoses:  Anxiety  Suicidal ideation    Rx / DC Orders ED Discharge Orders    None       Antonietta Breach, PA-C [id number] [phone]    Delora Fuel, MD [id number] [phone]

## 2020-09-17 NOTE — ED Notes (Signed)
ED Provider at bedside. 

## 2021-04-22 ENCOUNTER — Other Ambulatory Visit: Payer: Self-pay

## 2021-04-22 ENCOUNTER — Emergency Department (HOSPITAL_COMMUNITY)
Admission: EM | Admit: 2021-04-22 | Discharge: 2021-04-22 | Disposition: A | Discharge status: Home / Self Care | Payer: Medicaid - Out of State | Attending: Emergency Medicine | Admitting: Emergency Medicine

## 2021-04-22 ENCOUNTER — Encounter (HOSPITAL_COMMUNITY): Payer: Self-pay | Admitting: *Deleted

## 2021-04-22 ENCOUNTER — Emergency Department (HOSPITAL_COMMUNITY): Payer: Medicaid - Out of State

## 2021-04-22 DIAGNOSIS — R0789 Other chest pain: Secondary | ICD-10-CM | POA: Diagnosis not present

## 2021-04-22 DIAGNOSIS — Z87891 Personal history of nicotine dependence: Secondary | ICD-10-CM | POA: Diagnosis not present

## 2021-04-22 DIAGNOSIS — J069 Acute upper respiratory infection, unspecified: Secondary | ICD-10-CM | POA: Diagnosis not present

## 2021-04-22 DIAGNOSIS — Z20822 Contact with and (suspected) exposure to covid-19: Secondary | ICD-10-CM | POA: Insufficient documentation

## 2021-04-22 DIAGNOSIS — R059 Cough, unspecified: Secondary | ICD-10-CM | POA: Diagnosis present

## 2021-04-22 LAB — BASIC METABOLIC PANEL
Anion gap: 6 (ref 5–15)
BUN: 30 mg/dL — ABNORMAL HIGH (ref 6–20)
CO2: 24 mmol/L (ref 22–32)
Calcium: 8.9 mg/dL (ref 8.9–10.3)
Chloride: 105 mmol/L (ref 98–111)
Creatinine, Ser: 0.91 mg/dL (ref 0.44–1.00)
GFR, Estimated: >60 mL/min (ref >60)
Glucose, Bld: 121 mg/dL — ABNORMAL HIGH (ref 70–99)
Potassium: 4.4 mmol/L (ref 3.5–5.1)
Sodium: 135 mmol/L (ref 135–145)

## 2021-04-22 LAB — CBC
HCT: 39.1 % (ref 36.0–46.0)
Hemoglobin: 12.8 g/dL (ref 12.0–15.0)
MCH: 29.5 pg (ref 26.0–34.0)
MCHC: 32.7 g/dL (ref 30.0–36.0)
MCV: 90.1 fL (ref 80.0–100.0)
Platelets: 240 10*3/uL (ref 150–400)
RBC: 4.34 MIL/uL (ref 3.87–5.11)
RDW: 12.5 % (ref 11.5–15.5)
WBC: 12.2 10*3/uL — ABNORMAL HIGH (ref 4.0–10.5)
nRBC: 0 % (ref 0.0–0.2)

## 2021-04-22 LAB — TROPONIN I (HIGH SENSITIVITY): Troponin I (High Sensitivity): 4 ng/L (ref <18)

## 2021-04-22 NOTE — ED Triage Notes (Signed)
Shortness of breath with back pain onset 2 months ago, states she has been cleaning out a house and is afraid she has been exposed to mold

## 2021-04-22 NOTE — ED Provider Notes (Signed)
[facility] EMERGENCY DEPARTMENT Provider Note   CSN: [id number] Arrival date & time: 04/22/21  1215     History No chief complaint on file.   Briana Wyatt is a 50 y.o. female.  HPI 50 year old female presents with cough and chest pain. Started 2 days ago with cough, now has chest wall pain and thoracic back pain since waking up this morning. About 4 years ago she had sepsis and is wanting to make sure it's not that. She denies fevers. Has been having dyspnea for months that she thinks might be related to mold from receiving dusty packages of things related to her deceased relative. Back/chest pain is worse with palpation and inspiration. Her cough has clear mucous. No meds have been tried besides her albuterol inhaler.   Past Medical History:  Diagnosis Date   Acid reflux    Congenital single kidney    absent left   Restless leg syndrome    Sciatica of right side     Patient Active Problem List   Diagnosis Date Noted   Cocaine-induced mood disorder ([place])     Past Surgical History:  Procedure Laterality Date   ECTOPIC PREGNANCY SURGERY     TUBAL LIGATION       OB History   No obstetric history on file.     No family history on file.  Social History   Tobacco Use   Smoking status: Former    Types: Cigarettes   Smokeless tobacco: Never  Vaping Use   Vaping Use: Never used  Substance Use Topics   Alcohol use: No   Drug use: No    Home Medications Prior to Admission medications   Medication Sig Start Date End Date Taking? Authorizing Provider  baclofen (LIORESAL) 10 MG tablet Take 10 mg by mouth 2 (two) times daily.    [provider]  carbamazepine (TEGRETOL) 200 MG tablet Take 1 tablet (200 mg total) by mouth 2 (two) times daily. 09/17/20   Money, Lowry Ram, FNP  celecoxib (CELEBREX) 100 MG capsule Take 100 mg by mouth 2 (two) times daily.    [provider]  cetirizine (ZYRTEC) 10 MG tablet Take 10 mg by mouth daily. 05/05/20    [provider]  cyproheptadine (PERIACTIN) 4 MG tablet Take 4 mg by mouth at bedtime.    [provider]  DULoxetine (CYMBALTA) 60 MG capsule Take 60 mg by mouth 2 (two) times daily. 02/15/19   [provider]  estradiol-norethindrone (ACTIVELLA) 1-0.5 MG tablet Take 1 tablet by mouth at bedtime. 03/08/20   [provider]  loratadine (CLARITIN) 10 MG tablet Take 10 mg by mouth daily.    [provider]  losartan (COZAAR) 25 MG tablet Take 25 mg by mouth daily.    [provider]  pantoprazole (PROTONIX) 40 MG tablet Take 40 mg by mouth daily.    [provider]  pregabalin (LYRICA) 100 MG capsule Take 100 mg by mouth 2 (two) times daily. 04/15/20   [provider]    Allergies    Codeine, Nsaids, and Robaxin [methocarbamol]  Review of Systems   Review of Systems  Constitutional:  Negative for fever.  Respiratory:  Positive for cough and shortness of breath.   Cardiovascular:  Positive for chest pain. Negative for leg swelling.  Musculoskeletal:  Positive for back pain.  All other systems reviewed and are negative.  Physical Exam Updated Vital Signs BP 112/82   Pulse (!) 58   Temp 98  F (36.7 C) (Oral)   Resp 20   SpO2 94%   Physical Exam Vitals and nursing note reviewed.  Constitutional:      General: She is not in acute distress.    Appearance: She is well-developed. She is not ill-appearing or diaphoretic.  HENT:     Head: Normocephalic and atraumatic.     Right Ear: External ear normal.     Left Ear: External ear normal.     Nose: Nose normal.  Eyes:     General:        Right eye: No discharge.        Left eye: No discharge.  Cardiovascular:     Rate and Rhythm: Normal rate and regular rhythm.     Heart sounds: Normal heart sounds.  Pulmonary:     Effort: Pulmonary effort is normal.     Breath sounds: Normal breath sounds. No wheezing.  Chest:     Chest wall: Tenderness present.  Abdominal:      Palpations: Abdomen is soft.     Tenderness: There is no abdominal tenderness.  Musculoskeletal:     Comments: Mild thoracic back tenderness  Skin:    General: Skin is warm and dry.  Neurological:     Mental Status: She is alert.  Psychiatric:        Mood and Affect: Mood is not anxious.    ED Results / Procedures / Treatments   Labs (all labs ordered are listed, but only abnormal results are displayed) Labs Reviewed  BASIC METABOLIC PANEL - Abnormal; Notable for the following components:      Result Value   Glucose, Bld 121 (*)    BUN 30 (*)    All other components within normal limits  CBC - Abnormal; Notable for the following components:   WBC 12.2 (*)    All other components within normal limits  SARS CORONAVIRUS 2 (TAT 6-24 HRS)  TROPONIN I (HIGH SENSITIVITY)    EKG EKG Interpretation  Date/Time:  Tuesday April 22 2021 12:50:17 EDT Ventricular Rate:  69 PR Interval:  138 QRS Duration: 96 QT Interval:  398 QTC Calculation: 426 R Axis:   33 Text Interpretation: Normal sinus rhythm no acute ST/T changes similar to 2007 Confirmed by Sherwood Gambler [phone]) on 04/22/2021 2:59:16 PM  Radiology DG Chest 2 View  Result Date: 04/22/2021 CLINICAL DATA:  Short of breath, back pain EXAM: CHEST - 2 VIEW COMPARISON:  None. FINDINGS: Normal mediastinum and cardiac silhouette. Normal pulmonary vasculature. No evidence of effusion, infiltrate, or pneumothorax. No acute bony abnormality. Degenerative osteophytosis of the spine. IMPRESSION: No active cardiopulmonary disease. Electronically Signed   By: Suzy Bouchard M.D.   On: 04/22/2021 13:48    Procedures Procedures   Medications Ordered in ED Medications - No data to display  ED Course  I have reviewed the triage vital signs and the nursing notes.  Pertinent labs & imaging results that were available during my care of the patient were reviewed by me and considered in my medical decision making (see chart for  details).    MDM Rules/Calculators/A&P                           Patient's chest pain is likely chest wall from coughing. Low suspicion for PE (no tachycardia, hypoxia, etc), ACS, dissection. No evidence of bacterial pneumonia. Likely has MSK pain, recommend OTC meds. She also has low likelihood of sepsis. Does have mild  WBC elevation, but is otherwise well appearing and with no bacterial infection sepsis is unlikely. Will test for covid and d/c with supportive care and return precautions.  Final Clinical Impression(s) / ED Diagnoses Final diagnoses:  Viral upper respiratory infection  Chest wall pain    Rx / DC Orders ED Discharge Orders     None        Sherwood Gambler, MD 04/22/21 1517

## 2021-04-22 NOTE — Discharge Instructions (Addendum)
If you develop high fever, severe cough or cough with blood, trouble breathing, severe headache, neck pain/stiffness, vomiting, or any other new/concerning symptoms then return to the ER for evaluation  

## 2021-04-22 NOTE — ED Notes (Signed)
Pt provided discharge instructions and prescription information. Pt was given the opportunity to ask questions and questions were answered. Discharge signature not obtained in the setting of the COVID-19 pandemic in order to reduce high touch surfaces.  ° °

## 2021-04-23 ENCOUNTER — Emergency Department (HOSPITAL_COMMUNITY)
Admission: EM | Admit: 2021-04-23 | Discharge: 2021-04-23 | Disposition: A | Discharge status: Home / Self Care | Payer: Medicaid - Out of State | Attending: Emergency Medicine | Admitting: Emergency Medicine

## 2021-04-23 ENCOUNTER — Encounter (HOSPITAL_COMMUNITY): Payer: Self-pay | Admitting: Emergency Medicine

## 2021-04-23 DIAGNOSIS — R109 Unspecified abdominal pain: Secondary | ICD-10-CM | POA: Diagnosis not present

## 2021-04-23 DIAGNOSIS — Z5321 Procedure and treatment not carried out due to patient leaving prior to being seen by health care provider: Secondary | ICD-10-CM | POA: Diagnosis not present

## 2021-04-23 LAB — SARS CORONAVIRUS 2 (TAT 6-24 HRS): SARS Coronavirus 2: NEGATIVE

## 2021-04-23 NOTE — ED Triage Notes (Signed)
Abd pain that started on and off for a while.  Pt states she thinks its her spleen. Seen here yesterday and these issues were address.

## 2021-07-09 ENCOUNTER — Encounter (HOSPITAL_COMMUNITY): Payer: Self-pay

## 2021-07-09 ENCOUNTER — Emergency Department (HOSPITAL_COMMUNITY): Payer: Self-pay

## 2021-07-09 ENCOUNTER — Emergency Department (HOSPITAL_COMMUNITY)
Admission: EM | Admit: 2021-07-09 | Discharge: 2021-07-09 | Disposition: A | Discharge status: Home / Self Care | Payer: Self-pay | Attending: Emergency Medicine | Admitting: Emergency Medicine

## 2021-07-09 DIAGNOSIS — Z20822 Contact with and (suspected) exposure to covid-19: Secondary | ICD-10-CM | POA: Insufficient documentation

## 2021-07-09 DIAGNOSIS — J441 Chronic obstructive pulmonary disease with (acute) exacerbation: Secondary | ICD-10-CM

## 2021-07-09 DIAGNOSIS — R059 Cough, unspecified: Secondary | ICD-10-CM

## 2021-07-09 DIAGNOSIS — Z87891 Personal history of nicotine dependence: Secondary | ICD-10-CM | POA: Insufficient documentation

## 2021-07-09 DIAGNOSIS — Z79899 Other long term (current) drug therapy: Secondary | ICD-10-CM | POA: Insufficient documentation

## 2021-07-09 LAB — RESP PANEL BY RT-PCR (FLU A&B, COVID) ARPGX2
Influenza A by PCR: NEGATIVE
Influenza B by PCR: NEGATIVE
SARS Coronavirus 2 by RT PCR: NEGATIVE

## 2021-07-09 LAB — BASIC METABOLIC PANEL
Anion gap: 10 (ref 5–15)
BUN: 13 mg/dL (ref 6–20)
CO2: 22 mmol/L (ref 22–32)
Calcium: 9 mg/dL (ref 8.9–10.3)
Chloride: 104 mmol/L (ref 98–111)
Creatinine, Ser: 1.11 mg/dL — ABNORMAL HIGH (ref 0.44–1.00)
GFR, Estimated: 60 mL/min — ABNORMAL LOW (ref >60)
Glucose, Bld: 150 mg/dL — ABNORMAL HIGH (ref 70–99)
Potassium: 3.6 mmol/L (ref 3.5–5.1)
Sodium: 136 mmol/L (ref 135–145)

## 2021-07-09 LAB — CBC WITH DIFFERENTIAL/PLATELET
Abs Immature Granulocytes: 0.01 10*3/uL (ref 0.00–0.07)
Basophils Absolute: 0 10*3/uL (ref 0.0–0.1)
Basophils Relative: 1 %
Eosinophils Absolute: 0.1 10*3/uL (ref 0.0–0.5)
Eosinophils Relative: 2 %
HCT: 38.8 % (ref 36.0–46.0)
Hemoglobin: 13 g/dL (ref 12.0–15.0)
Immature Granulocytes: 0 %
Lymphocytes Relative: 15 %
Lymphs Abs: 0.7 10*3/uL (ref 0.7–4.0)
MCH: 29.7 pg (ref 26.0–34.0)
MCHC: 33.5 g/dL (ref 30.0–36.0)
MCV: 88.8 fL (ref 80.0–100.0)
Monocytes Absolute: 0.5 10*3/uL (ref 0.1–1.0)
Monocytes Relative: 11 %
Neutro Abs: 3 10*3/uL (ref 1.7–7.7)
Neutrophils Relative %: 71 %
Platelets: 213 10*3/uL (ref 150–400)
RBC: 4.37 MIL/uL (ref 3.87–5.11)
RDW: 13.3 % (ref 11.5–15.5)
WBC: 4.3 10*3/uL (ref 4.0–10.5)
nRBC: 0 % (ref 0.0–0.2)

## 2021-07-09 LAB — BRAIN NATRIURETIC PEPTIDE: B Natriuretic Peptide: 16 pg/mL (ref 0.0–100.0)

## 2021-07-09 MED ORDER — ALBUTEROL SULFATE HFA 108 (90 BASE) MCG/ACT IN AERS
2.0000 | INHALATION_SPRAY | Freq: Once | RESPIRATORY_TRACT | Status: AC
  Administered 2021-07-09: 2 via RESPIRATORY_TRACT
  Filled 2021-07-09: qty 6.7

## 2021-07-09 MED ORDER — PREDNISONE 10 MG PO TABS: 20.0000 mg | ORAL_TABLET | Freq: Every day | ORAL | 0 refills | Status: AC

## 2021-07-09 MED ORDER — IPRATROPIUM-ALBUTEROL 0.5-2.5 (3) MG/3ML IN SOLN
3.0000 mL | Freq: Once | RESPIRATORY_TRACT | Status: AC
  Administered 2021-07-09: 3 mL via RESPIRATORY_TRACT
  Filled 2021-07-09: qty 3

## 2021-07-09 MED ORDER — METHYLPREDNISOLONE SODIUM SUCC 125 MG IJ SOLR
125.0000 mg | Freq: Once | INTRAMUSCULAR | Status: AC
  Administered 2021-07-09: 125 mg via INTRAVENOUS
  Filled 2021-07-09: qty 2

## 2021-07-09 NOTE — ED Provider Notes (Signed)
[facility] Provider Note   CSN: [id number] Arrival date & time: 07/09/21  1150     History No chief complaint on file.   Briana Wyatt is a 49 y.o. female.  50 year old female with past medical history of COPD presents with complaint of shortness of breath.  Reports cough and congestion for the past week after being exposed to several people who have tested positive for the flu.  Patient has an inhaler but states this has not helped with her symptoms.  Denies fevers, chills, chest pain or lower extremity edema.  Patient states that she is homeless, currently living with family.  No other complaints or concerns today.      Past Medical History:  Diagnosis Date   Acid reflux    Congenital single kidney    absent left   Restless leg syndrome    Sciatica of right side     Patient Active Problem List   Diagnosis Date Noted   Cocaine-induced mood disorder ([place])     Past Surgical History:  Procedure Laterality Date   ECTOPIC PREGNANCY SURGERY     TUBAL LIGATION       OB History   No obstetric history on file.     No family history on file.  Social History   Tobacco Use   Smoking status: Former    Types: Cigarettes   Smokeless tobacco: Never  Vaping Use   Vaping Use: Never used  Substance Use Topics   Alcohol use: No   Drug use: No    Home Medications Prior to Admission medications   Medication Sig Start Date End Date Taking? Authorizing Provider  predniSONE (DELTASONE) 10 MG tablet Take 2 tablets (20 mg total) by mouth daily for 5 days. 07/09/21 07/14/21 Yes Tacy Learn, PA-C  baclofen (LIORESAL) 10 MG tablet Take 10 mg by mouth 2 (two) times daily.    [provider]  carbamazepine (TEGRETOL) 200 MG tablet Take 1 tablet (200 mg total) by mouth 2 (two) times daily. 09/17/20   Money, Lowry Ram, FNP  celecoxib (CELEBREX) 100 MG capsule Take 100 mg by mouth 2 (two) times daily.    [provider]  cetirizine  (ZYRTEC) 10 MG tablet Take 10 mg by mouth daily. 05/05/20   [provider]  cyproheptadine (PERIACTIN) 4 MG tablet Take 4 mg by mouth at bedtime.    [provider]  DULoxetine (CYMBALTA) 60 MG capsule Take 60 mg by mouth 2 (two) times daily. 02/15/19   [provider]  estradiol-norethindrone (ACTIVELLA) 1-0.5 MG tablet Take 1 tablet by mouth at bedtime. 03/08/20   [provider]  loratadine (CLARITIN) 10 MG tablet Take 10 mg by mouth daily.    [provider]  losartan (COZAAR) 25 MG tablet Take 25 mg by mouth daily.    [provider]  pantoprazole (PROTONIX) 40 MG tablet Take 40 mg by mouth daily.    [provider]  pregabalin (LYRICA) 100 MG capsule Take 100 mg by mouth 2 (two) times daily. 04/15/20   [provider]    Allergies    Codeine, Nsaids, and Robaxin [methocarbamol]  Review of Systems   Review of Systems  Constitutional:  Negative for chills and fever.  HENT:  Positive for congestion.   Respiratory:  Positive for cough, shortness of breath and wheezing.   Gastrointestinal:  Negative for abdominal pain, constipation, diarrhea, nausea and vomiting.  Musculoskeletal:  Negative for arthralgias and myalgias.  Skin:  Negative for rash and wound.  Allergic/Immunologic: Negative for immunocompromised state.  Neurological:  Negative for weakness.  Hematological:  Negative for adenopathy.  Psychiatric/Behavioral:  Negative for confusion.   All other systems reviewed and are negative.  Physical Exam Updated Vital Signs BP 120/84   Pulse 91   Temp 97.8 F (36.6 C) (Oral)   Resp (!) 22   Ht 5\' 6"  (1.676 m)   Wt 95.3 kg   SpO2 97%   BMI 33.89 kg/m   Physical Exam Vitals and nursing note reviewed.  Constitutional:      General: She is not in acute distress.    Appearance: She is well-developed. She is not diaphoretic.  HENT:     Head: Normocephalic and atraumatic.     Nose: Nose normal.      Mouth/Throat:     Mouth: Mucous membranes are moist.  Eyes:     Conjunctiva/sclera: Conjunctivae normal.  Cardiovascular:     Rate and Rhythm: Regular rhythm. Tachycardia present.  Pulmonary:     Effort: Tachypnea present.     Breath sounds: Wheezing and rhonchi present.  Abdominal:     Palpations: Abdomen is soft.     Tenderness: There is no abdominal tenderness.  Musculoskeletal:     Right lower leg: No edema.     Left lower leg: No edema.  Skin:    General: Skin is warm and dry.     Findings: No erythema or rash.  Neurological:     Mental Status: She is alert and oriented to person, place, and time.  Psychiatric:        Behavior: Behavior normal.    ED Results / Procedures / Treatments   Labs (all labs ordered are listed, but only abnormal results are displayed) Labs Reviewed  BASIC METABOLIC PANEL - Abnormal; Notable for the following components:      Result Value   Glucose, Bld 150 (*)    Creatinine, Ser 1.11 (*)    GFR, Estimated 60 (*)    All other components within normal limits  RESP PANEL BY RT-PCR (FLU A&B, COVID) ARPGX2  BRAIN NATRIURETIC PEPTIDE  CBC WITH DIFFERENTIAL/PLATELET    EKG EKG Interpretation  Date/Time:  Wednesday July 09 2021 12:18:37 EST Ventricular Rate:  100 PR Interval:  140 QRS Duration: 103 QT Interval:  339 QTC Calculation: 438 R Axis:   49 Text Interpretation: Sinus tachycardia Minimal ST elevation, inferior leads Baseline wander in lead(s) II III aVF No significant change since last tracing Confirmed by Calvert Cantor [phone]) on 07/09/2021 2:03:15 PM  Radiology DG Chest Port 1 View  Result Date: 07/09/2021 CLINICAL DATA:  Cough EXAM: PORTABLE CHEST 1 VIEW COMPARISON:  04/22/2021 FINDINGS: The heart size and mediastinal contours are within normal limits. Both lungs are clear. No pleural effusion. The visualized skeletal structures are unremarkable. IMPRESSION: No acute process in the chest. Electronically Signed   By:  Macy Mis M.D.   On: 07/09/2021 13:02    Procedures Procedures   Medications Ordered in ED Medications  methylPREDNISolone sodium succinate (SOLU-MEDROL) 125 mg/2 mL injection 125 mg (125 mg Intravenous Given 07/09/21 1331)  ipratropium-albuterol (DUONEB) 0.5-2.5 (3) MG/3ML nebulizer solution 3 mL (3 mLs Nebulization Given 07/09/21 1303)  albuterol (VENTOLIN HFA) 108 (90 Base) MCG/ACT inhaler 2 puff (2 puffs Inhalation Given 07/09/21 1454)    ED Course  I have reviewed the triage vital signs and the nursing notes.  Pertinent labs & imaging results that were available during my care of  the patient were reviewed by me and considered in my medical decision making (see chart for details).  Clinical Course as of 07/09/21 1456  Wed Jul 10, 6119  1237 50 year old female with history of COPD presents with complaint of cough, congestion and concern for flu after exposure by multiple family members.  On exam, patient is tachypneic, wheezing with coarse lung sounds throughout, O2 sat in the mid 90% range on room air.  There is no lower extremity edema.  Patient is afebrile.  Patient was given DuoNeb treatment with Solu-Medrol IV.  Her chest x-ray is negative for acute process.  Her CBC is within normal limits, BMP without significant changes, BNP is normal at 16.  Patient is negative for COVID and the flu.  Work-up today is largely reassuring.  On recheck, patient remains very tachypneic with audible wheezing.  On observation from outside the room, respirations are even and unlabored, she is no longer tachypneic.  Plan is to discharge with albuterol inhaler and course of prednisone. [LM]    Clinical Course User Index [LM] Roque Lias   MDM Rules/Calculators/A&P                           Final Clinical Impression(s) / ED Diagnoses Final diagnoses:  COPD with acute exacerbation [facility])    Rx / DC Orders ED Discharge Orders          Ordered    predniSONE (DELTASONE) 10 MG  tablet  Daily        07/09/21 1452             Tacy Learn, PA-C 07/09/21 1456    Truddie Hidden, MD 07/09/21 [phone]

## 2021-07-09 NOTE — ED Triage Notes (Addendum)
Pt reports coughing, congestion and SOB, pt reports of being sick for 4 days.  Skin pale, alert and oriented.  Pain in her left ear, with a headache.  Reports family has flu

## 2021-07-09 NOTE — Discharge Instructions (Signed)
Take prednisone as prescribed and complete the full course.  Use inhaler 1 to 2 puffs every 4-6 hours as needed.  Follow-up with your primary care provider.

## 2021-09-03 DIAGNOSIS — Z139 Encounter for screening, unspecified: Secondary | ICD-10-CM

## 2021-09-03 LAB — GLUCOSE, POCT (MANUAL RESULT ENTRY): POC Glucose: 95 mg/dl (ref 70–99)

## 2021-09-03 NOTE — Congregational Nurse Program (Signed)
Pt requested Medical Assessment as a result of completing their [facility] Enrollment on 1.11.23  Pt states she is currently out of all medications and is concerned about management of her current medical conditions.  She states she last saw her provider 12.2022 in [place], [place] ([employer] [employer]).  She states she was seeing PCP, orthopaedic, behavioral health, endocrinologist.  Medical assessment interview, vitals and health questionnaire were completed during visit.      Vitals :   ABN: Pulse, BP and Respiration  Consulted with medical director, Asencion Noble regarding meds and sending to ER .  He advised due to her medical history/diagnosis/ and meds being complicated  to setup a follow up visit with PCP as soon as possible.  He reviewed her chart for her normal pattern of vitals and also advised that she should be ok until her appt, however if any emergent issues to arise as symptoms of shortness of breath, fainting/passing happen or condition worsen then to attend Emergency Room.    Otherwise keep upcoming appt PCP  that will be setup by W.Concetta Guion, LPN       Pt Needs/Resources identified -[facility] Wed [phone] @ 11am  -Interpersonal safety at risk related to emotional stress (referred to get setup up with on-site therapist at [facility] by making provider aware of interest.  -Pt was Referred jn to RN Nurse Case Manager, Mayra Reel to provide ongoing medical case management needs and/or other SDOH needs identified during today's visit    Pt was advised to take all empty bottles to appointment, copayment, arrival details policies were also reviewed with her

## 2021-09-03 NOTE — Congregational Nurse Program (Signed)
°  Dept: [phone]   Congregational Nurse Program Note  Date of Encounter: 09/03/2021  Past Medical History: Past Medical History:  Diagnosis Date   Acid reflux    Congenital single kidney    absent left   Restless leg syndrome    Sciatica of right side     Encounter Details:

## 2021-09-07 ENCOUNTER — Emergency Department (HOSPITAL_COMMUNITY)
Admission: EM | Admit: 2021-09-07 | Discharge: 2021-09-07 | Disposition: A | Discharge status: Home / Self Care | Payer: Self-pay | Attending: Emergency Medicine | Admitting: Emergency Medicine

## 2021-09-07 ENCOUNTER — Encounter (HOSPITAL_COMMUNITY): Payer: Self-pay | Admitting: Emergency Medicine

## 2021-09-07 ENCOUNTER — Emergency Department (HOSPITAL_COMMUNITY): Payer: Self-pay

## 2021-09-07 ENCOUNTER — Other Ambulatory Visit: Payer: Self-pay

## 2021-09-07 DIAGNOSIS — R062 Wheezing: Secondary | ICD-10-CM | POA: Insufficient documentation

## 2021-09-07 DIAGNOSIS — R079 Chest pain, unspecified: Secondary | ICD-10-CM | POA: Insufficient documentation

## 2021-09-07 DIAGNOSIS — R519 Headache, unspecified: Secondary | ICD-10-CM | POA: Insufficient documentation

## 2021-09-07 DIAGNOSIS — H53149 Visual discomfort, unspecified: Secondary | ICD-10-CM | POA: Insufficient documentation

## 2021-09-07 DIAGNOSIS — Z20822 Contact with and (suspected) exposure to covid-19: Secondary | ICD-10-CM | POA: Insufficient documentation

## 2021-09-07 DIAGNOSIS — R0602 Shortness of breath: Secondary | ICD-10-CM | POA: Insufficient documentation

## 2021-09-07 DIAGNOSIS — R110 Nausea: Secondary | ICD-10-CM | POA: Insufficient documentation

## 2021-09-07 LAB — BASIC METABOLIC PANEL
Anion gap: 9 (ref 5–15)
BUN: 29 mg/dL — ABNORMAL HIGH (ref 6–20)
CO2: 22 mmol/L (ref 22–32)
Calcium: 9.4 mg/dL (ref 8.9–10.3)
Chloride: 105 mmol/L (ref 98–111)
Creatinine, Ser: 0.88 mg/dL (ref 0.44–1.00)
GFR, Estimated: >60 mL/min (ref >60)
Glucose, Bld: 103 mg/dL — ABNORMAL HIGH (ref 70–99)
Potassium: 4.2 mmol/L (ref 3.5–5.1)
Sodium: 136 mmol/L (ref 135–145)

## 2021-09-07 LAB — CBC
HCT: 40.6 % (ref 36.0–46.0)
Hemoglobin: 13 g/dL (ref 12.0–15.0)
MCH: 27.6 pg (ref 26.0–34.0)
MCHC: 32 g/dL (ref 30.0–36.0)
MCV: 86.2 fL (ref 80.0–100.0)
Platelets: 257 10*3/uL (ref 150–400)
RBC: 4.71 MIL/uL (ref 3.87–5.11)
RDW: 11.9 % (ref 11.5–15.5)
WBC: 5.8 10*3/uL (ref 4.0–10.5)
nRBC: 0 % (ref 0.0–0.2)

## 2021-09-07 LAB — RESP PANEL BY RT-PCR (FLU A&B, COVID) ARPGX2
Influenza A by PCR: NEGATIVE
Influenza B by PCR: NEGATIVE
SARS Coronavirus 2 by RT PCR: NEGATIVE

## 2021-09-07 LAB — TROPONIN I (HIGH SENSITIVITY)
Troponin I (High Sensitivity): 7 ng/L (ref <18)
Troponin I (High Sensitivity): 6 ng/L (ref <18)

## 2021-09-07 MED ORDER — MORPHINE SULFATE (PF) 4 MG/ML IV SOLN
4.0000 mg | Freq: Once | INTRAVENOUS | Status: AC
  Administered 2021-09-07: 4 mg via INTRAVENOUS
  Filled 2021-09-07: qty 1

## 2021-09-07 MED ORDER — DIPHENHYDRAMINE HCL 50 MG/ML IJ SOLN
12.5000 mg | Freq: Once | INTRAMUSCULAR | Status: AC
  Administered 2021-09-07: 12.5 mg via INTRAVENOUS
  Filled 2021-09-07: qty 1

## 2021-09-07 MED ORDER — IPRATROPIUM-ALBUTEROL 0.5-2.5 (3) MG/3ML IN SOLN
3.0000 mL | Freq: Once | RESPIRATORY_TRACT | Status: AC
  Administered 2021-09-07: 3 mL via RESPIRATORY_TRACT
  Filled 2021-09-07: qty 3

## 2021-09-07 MED ORDER — METOCLOPRAMIDE HCL 5 MG/ML IJ SOLN
10.0000 mg | Freq: Once | INTRAMUSCULAR | Status: AC
  Administered 2021-09-07: 10 mg via INTRAVENOUS
  Filled 2021-09-07: qty 2

## 2021-09-07 MED ORDER — HYDROMORPHONE HCL 1 MG/ML IJ SOLN
0.5000 mg | Freq: Once | INTRAMUSCULAR | Status: AC
  Administered 2021-09-07: 0.5 mg via INTRAVENOUS
  Filled 2021-09-07: qty 1

## 2021-09-07 NOTE — Discharge Instructions (Signed)
You are seen in the ER today for your body aches, chest tightness, headache, and your generalized fatigue with your upper respiratory symptoms.  Suspect you have an acute viral illness.  Your headache was improved after administration of medications in the emergency department.  Your cardiac work-up was reassuring.  Please follow-up with your primary care doctor and cardiologist as recommended in the outpatient setting and return to the ER for develop any difficulty breathing, worsening chest pain, nausea or vomiting that does not stop, blurred or double vision, or any other new severe symptom.

## 2021-09-07 NOTE — ED Triage Notes (Signed)
Patient c/o left sided chest pain, non-radiating that started today with a migraine headache. Per patient shortness of breath, nausea, and light headedness. Patient states "There is something going on with my heart that my doctor is aware of but hasn't figured it out yet." Patient states HR was 41 on Wednesday. Denies any other cardiac hx. Patient reports migraine worse with movement and sound.

## 2021-09-07 NOTE — ED Provider Triage Note (Signed)
Emergency Medicine Provider Triage Evaluation Note  Briana Wyatt , a 51 y.o. female  was evaluated in triage.  Pt complains of left-sided chest pain that radiates to the left side of her back this is been ongoing now for several months.  She states today she also has a migraine which is making her symptoms worse.  She reports associated shortness of breath wheezing.  She reports her primary care provider told her that there is something going on with a heart  Review of Systems  Positive: As above Negative: As above  Physical Exam  BP (!) 142/98 (BP Location: Right Arm)    Pulse 77    Temp 97.6 F (36.4 C) (Oral)    Resp (!) 22    Ht 5\' 5"  (1.651 m)    Wt 93 kg    SpO2 100%    BMI 34.11 kg/m  Gen:   Awake, no distress   Resp:  Normal effort  MSK:   Moves extremities without difficulty. Tender over anterior chest. Other:    Medical Decision Making  Medically screening exam initiated at 3:59 PM.  Appropriate orders placed.  Anila Bojarski was informed that the remainder of the evaluation will be completed by another provider, this initial triage assessment does not replace that evaluation, and the importance of remaining in the ED until their evaluation is complete.     Evlyn Courier, PA-C 09/07/21 1610

## 2021-09-07 NOTE — ED Provider Notes (Signed)
[facility] ([place]) EMERGENCY DEPARTMENT Provider Note   CSN: [id number] Arrival date & time: 09/07/21  1436     History  Chief Complaint  Patient presents with   Chest Pain    Briana Wyatt is a 51 y.o. female who presents with concern for nonradiating left-sided chest pain, body aches, and migraine headache with mild associated shortness of breath, nausea, photosensitivity, and phono sensitivity.  Endorses soreness in the muscles of her back but denies true neck pain.  Patient states that she has recently been evaluated by her primary care doctor for "something wrong with my heart" but states she does not have definitive diagnosis at this time.  Have personally reviewed this patient's medical records.  She is history of cocaine use in the past, congenital single kidney, restless leg syndrome, and sciatica as well as history of migraines.  Has sumatriptan at home but has not used it.  HPI     Home Medications Prior to Admission medications   Medication Sig Start Date End Date Taking? Authorizing Provider  acetaminophen (TYLENOL) 500 MG tablet Take 1,000 mg by mouth every 6 (six) hours as needed.   Yes [provider]  albuterol (VENTOLIN HFA) 108 (90 Base) MCG/ACT inhaler Inhale 2 puffs into the lungs every 6 (six) hours as needed for wheezing or shortness of breath.   Yes [provider]  baclofen (LIORESAL) 10 MG tablet Take 10 mg by mouth 2 (two) times daily.   Yes [provider]  cetirizine (ZYRTEC) 10 MG tablet Take 10 mg by mouth daily. 05/05/20  Yes [provider]  Cholecalciferol (VITAMIN D-3 PO) Take 1 tablet by mouth daily.   Yes [provider]  DULoxetine (CYMBALTA) 60 MG capsule Take 60 mg by mouth 2 (two) times daily. 02/15/19  Yes [provider]  Multiple Vitamins-Minerals (CENTRUM SILVER PO) Take 1 tablet by mouth daily.   Yes [provider]  pantoprazole (PROTONIX) 40 MG tablet Take 40 mg by mouth daily.    Yes [provider]  pregabalin (LYRICA) 100 MG capsule Take 100 mg by mouth 2 (two) times daily. 04/15/20  Yes [provider]  SUMAtriptan (IMITREX) 50 MG tablet Take 50 mg by mouth every 2 (two) hours as needed for migraine. 06/30/21  Yes [provider]  carbamazepine (TEGRETOL) 200 MG tablet Take 1 tablet (200 mg total) by mouth 2 (two) times daily. Patient not taking: Reported on 09/07/2021 09/17/20   Money, Lowry Ram, FNP      Allergies    Codeine, Nsaids, and Robaxin [methocarbamol]    Review of Systems   Review of Systems  Constitutional:  Positive for appetite change, chills and fatigue. Negative for activity change.  HENT: Negative.  Negative for congestion and rhinorrhea.   Eyes:  Positive for photophobia. Negative for visual disturbance.  Respiratory:  Positive for chest tightness and shortness of breath.   Cardiovascular:  Positive for chest pain.  Gastrointestinal:  Positive for nausea.  Musculoskeletal:  Positive for neck pain. Negative for neck stiffness.  Skin: Negative.   Neurological:  Positive for headaches. Negative for syncope and weakness.   Physical Exam Updated Vital Signs BP (!) 133/94    Pulse 72    Temp 97.6 F (36.4 C) (Oral)    Resp 20    Ht 5\' 5"  (1.651 m)    Wt 93 kg    SpO2 95%    BMI 34.11 kg/m  Physical Exam Vitals and nursing note reviewed.  Constitutional:  Appearance: She is not ill-appearing or toxic-appearing.  HENT:     Head: Normocephalic and atraumatic.     Mouth/Throat:     Mouth: Mucous membranes are moist.     Pharynx: No oropharyngeal exudate or posterior oropharyngeal erythema.  Eyes:     General: Lids are normal. Vision grossly intact.        Right eye: No discharge.        Left eye: No discharge.     Extraocular Movements: Extraocular movements intact.     Conjunctiva/sclera: Conjunctivae normal.     Pupils: Pupils are equal, round, and reactive to light.     Comments: Photophobia  Neck:      Trachea: Trachea and phonation normal.     Meningeal: Brudzinski's sign and Kernig's sign absent.  Cardiovascular:     Rate and Rhythm: Normal rate and regular rhythm.     Pulses: Normal pulses.     Heart sounds: Normal heart sounds. No murmur heard. Pulmonary:     Effort: Pulmonary effort is normal. No tachypnea, bradypnea, accessory muscle usage or respiratory distress.     Breath sounds: Examination of the right-lower field reveals wheezing. Examination of the left-lower field reveals wheezing. Wheezing present. No rales.  Abdominal:     General: Bowel sounds are normal. There is no distension.     Tenderness: There is no abdominal tenderness.  Musculoskeletal:        General: No deformity.     Cervical back: Normal range of motion and neck supple.     Right lower leg: No edema.     Left lower leg: No edema.  Lymphadenopathy:     Cervical: No cervical adenopathy.  Skin:    General: Skin is warm and dry.     Capillary Refill: Capillary refill takes less than 2 seconds.  Neurological:     General: No focal deficit present.     Mental Status: She is alert. Mental status is at baseline.  Psychiatric:        Mood and Affect: Mood normal.    ED Results / Procedures / Treatments   Labs (all labs ordered are listed, but only abnormal results are displayed) Labs Reviewed  BASIC METABOLIC PANEL - Abnormal; Notable for the following components:      Result Value   Glucose, Bld 103 (*)    BUN 29 (*)    All other components within normal limits  RESP PANEL BY RT-PCR (FLU A&B, COVID) ARPGX2  CBC  TROPONIN I (HIGH SENSITIVITY)  TROPONIN I (HIGH SENSITIVITY)    EKG EKG Interpretation  Date/Time:  Sunday September 07 2021 14:45:19 EST Ventricular Rate:  75 PR Interval:  132 QRS Duration: 94 QT Interval:  424 QTC Calculation: 473 R Axis:   41 Text Interpretation: Normal sinus rhythm Normal ECG When compared with ECG of 09-Jul-2021 12:18, No significant change since last tracing  Confirmed by Aletta Edouard [phone]) on 09/07/2021 2:59:08 PM  Radiology No results found.  Procedures Procedures    Medications Ordered in ED Medications  metoCLOPramide (REGLAN) injection 10 mg (10 mg Intravenous Given 09/07/21 1804)  morphine 4 MG/ML injection 4 mg (4 mg Intravenous Given 09/07/21 1805)  diphenhydrAMINE (BENADRYL) injection 12.5 mg (12.5 mg Intravenous Given 09/07/21 1805)  ipratropium-albuterol (DUONEB) 0.5-2.5 (3) MG/3ML nebulizer solution 3 mL (3 mLs Nebulization Given 09/07/21 1805)  HYDROmorphone (DILAUDID) injection 0.5 mg (0.5 mg Intravenous Given 09/07/21 1948)    ED Course/ Medical Decision Making/ A&P  Medical Decision Making 51 year old female presents with chest pain and migraine type headache.  Emergent considerations for headache include subarachnoid hemorrhage, meningitis, temporal arteritis, glaucoma, cerebral ischemia, carotid/vertebral dissection, intracranial tumor, Venous sinus thrombosis, carbon monoxide poisoning, acute or chronic subdural hemorrhage.  Other considerations include: Migraine, Cluster headache, Tension headache, Hypertension, Caffeine / alcohol / drug withdrawal, Pseudotumor cerebri, Arteriovenous malformation, Head injury, Neurocysticercosis, Post-lumbar puncture, Preeclampsia, Cervical arthritis, Refractive error causing strain, Dental abscess, Sinusitis, Otitis media, Temporomandibular joint syndrome, Depression, Somatoform disorder (eg, somatization) Trigeminal neuralgia, Glossopharyngeal neuralgia.  Differential for chest pain includes but is not limited to ACS, PE, pleural effusion, dysrhythmia.  Patient was tachycardic and tachypneic on intake, vital signs otherwise normal.  Cardiopulmonary exam is normal, abdominal exam is benign.  Patient has nonfocal neurologic exam but she does have some photophobia on physical exam.  No meningeal signs.   Amount and/or Complexity of Data Reviewed Labs: ordered.     Details: CBC unremarkable, BMP unremarkable.  COVID-19 and influenza testing is negative.  Troponin is normal x2.  EKG is with normal sinus rhythm without ischemic changes. Radiology: ordered.    Details: Chest x-ray negative for acute cardiopulmonary disease.  Risk Prescription drug management.  Patient was administered DuoNeb for mild wheezing in the lung bases with expiration.  Resolution of these symptoms and improvement in her chest tightness.  Patient administered migraine cocktail with significant improvement in her headache.  Patient is able to ambulate independently in the emergency department.  Suspect viral illness contributing as etiology for patient's headache which developed into migraine as is her typical.  Doubt ACS given reassuring EKG and troponins.  Doubt meningitis given lack of meningeal signs, fever, or persistence of her headache.  Patient may take Imitrex at home as needed.  No further work-up warranted in the ER at this time given reassuring physical exam, vital signs, and work-up.  [place] voiced understanding of her medical evaluation and treatment plan.  Each of her questions was answered to her expressed satisfaction.  Return precautions were given.  Patient is well-appearing, stable, and appropriate for discharge at this time.  This chart was dictated using voice recognition software, Dragon. Despite the best efforts of this provider to proofread and correct errors, errors may still occur which can change documentation meaning.   Final Clinical Impression(s) / ED Diagnoses Final diagnoses:  Acute nonintractable headache, unspecified headache type    Rx / DC Orders ED Discharge Orders     None         Emeline Darling, PA-C 09/10/21 0900    Hayden Rasmussen, MD 09/11/21 [phone]

## 2021-09-10 ENCOUNTER — Ambulatory Visit: Payer: Medicaid Other | Admitting: Physician Assistant

## 2021-09-10 ENCOUNTER — Other Ambulatory Visit: Payer: Self-pay

## 2021-09-10 ENCOUNTER — Encounter: Payer: Self-pay | Admitting: Physician Assistant

## 2021-09-10 VITALS — BP 150/101 | HR 82 | Temp 97.6°F | Ht 66.0 in | Wt 205.0 lb

## 2021-09-10 DIAGNOSIS — M5412 Radiculopathy, cervical region: Secondary | ICD-10-CM

## 2021-09-10 DIAGNOSIS — F1411 Cocaine abuse, in remission: Secondary | ICD-10-CM

## 2021-09-10 DIAGNOSIS — R519 Headache, unspecified: Secondary | ICD-10-CM

## 2021-09-10 DIAGNOSIS — Z1239 Encounter for other screening for malignant neoplasm of breast: Secondary | ICD-10-CM

## 2021-09-10 DIAGNOSIS — Z9889 Other specified postprocedural states: Secondary | ICD-10-CM

## 2021-09-10 DIAGNOSIS — G8929 Other chronic pain: Secondary | ICD-10-CM

## 2021-09-10 DIAGNOSIS — Z7689 Persons encountering health services in other specified circumstances: Secondary | ICD-10-CM

## 2021-09-10 DIAGNOSIS — E669 Obesity, unspecified: Secondary | ICD-10-CM

## 2021-09-10 DIAGNOSIS — R222 Localized swelling, mass and lump, trunk: Secondary | ICD-10-CM

## 2021-09-10 DIAGNOSIS — F424 Excoriation (skin-picking) disorder: Secondary | ICD-10-CM

## 2021-09-10 DIAGNOSIS — R079 Chest pain, unspecified: Secondary | ICD-10-CM

## 2021-09-10 NOTE — Progress Notes (Signed)
BP (!) 150/101    Pulse 82    Temp 97.6 F (36.4 C)    Ht 5\' 6"  (1.676 m)    Wt 205 lb (93 kg)    SpO2 97%    BMI 33.09 kg/m    Subjective:    Patient ID: Briana Wyatt, female    DOB: 1970/12/01, 51 y.o.   MRN: [id number]  HPI: Briana Wyatt is a 51 y.o. female presenting on 09/10/2021 for New Patient (Initial Visit)   HPI  Chief Complaint  Patient presents with   New Patient (Initial Visit)     Pt is 78yoF with complex history and she has difficulty relating it in coherent fashion.  She does not work.  She is trying to get disability.  Pt says that a doctor at a free clinic in [place] (dr Broadus John) ordered: MRI & stress test.  She has MRI (head) scheduled for 09/15/21.  She also has referral for stress test in [place] but no appt  She reports having: Cervical radiculopathy and spondylosis, L hip bursitis Shoulder capsulitis and hx shoulder surgery x 2, Migraines, "Something wrong with heart" but nothing more specific  Pt has CP.  She gets it 2/daily.  She gets it at rest and with exertion.  She is always sob so no change with the cp.  She gets nausea.  She is always sweaty so she has no change with cp.   She was seen in ER for CP 09/07/21 and those records were reviewed.    She talks with a therapist over the telephone.   She had psychiatrist in [place] but will not be seing that one again.  From her psychiatrist she gets: Cymbalta Tegretal Lyrica She says her diagnosis was ptsd  She says she has a mass left chest wall that is uncomfortable.   Orthopedic records in [place]- last visit 05/27/21 (Dr Merlene Pulling).  Dx cervical radiculopathy, cervical spondylosis, myofascial pain, c-spine pain.    C-spine x-ray showed multi-level degenerative changes.  MRI was ordered but never done.     Relevant past medical, surgical, family and social history reviewed and updated as indicated. Interim medical history since our last visit reviewed. Allergies and medications reviewed and  updated.   Current Outpatient Medications:    albuterol (VENTOLIN HFA) 108 (90 Base) MCG/ACT inhaler, Inhale 2 puffs into the lungs every 6 (six) hours as needed for wheezing or shortness of breath., Disp: , Rfl:    baclofen (LIORESAL) 10 MG tablet, Take 10 mg by mouth 2 (two) times daily., Disp: , Rfl:    cetirizine (ZYRTEC) 10 MG tablet, Take 10 mg by mouth daily., Disp: , Rfl:    Cholecalciferol (VITAMIN D-3 PO), Take 1 tablet by mouth daily., Disp: , Rfl:    DULoxetine (CYMBALTA) 60 MG capsule, Take 60 mg by mouth 2 (two) times daily., Disp: , Rfl:    Multiple Vitamins-Minerals (CENTRUM SILVER PO), Take 1 tablet by mouth daily., Disp: , Rfl:    pregabalin (LYRICA) 100 MG capsule, Take 100 mg by mouth 2 (two) times daily., Disp: , Rfl:    SUMAtriptan (IMITREX) 50 MG tablet, Take 50 mg by mouth every 2 (two) hours as needed for migraine., Disp: , Rfl:    carbamazepine (TEGRETOL) 200 MG tablet, Take 1 tablet (200 mg total) by mouth 2 (two) times daily. (Patient not taking: Reported on 09/07/2021), Disp: 60 tablet, Rfl: 0   pantoprazole (PROTONIX) 40 MG tablet, Take 40 mg by mouth daily. (Patient not taking:  Reported on 09/10/2021), Disp: , Rfl:     Review of Systems  Per HPI unless specifically indicated above     Objective:    BP (!) 150/101    Pulse 82    Temp 97.6 F (36.4 C)    Ht 5\' 6"  (1.676 m)    Wt 205 lb (93 kg)    SpO2 97%    BMI 33.09 kg/m   Wt Readings from Last 3 Encounters:  09/10/21 205 lb (93 kg)  09/07/21 205 lb (93 kg)  09/03/21 205 lb 12.8 oz (93.4 kg)    Physical Exam Vitals reviewed.  Constitutional:      General: She is not in acute distress.    Appearance: She is well-developed. She is not ill-appearing.  HENT:     Head: Normocephalic and atraumatic.  Eyes:     Extraocular Movements: Extraocular movements intact.     Conjunctiva/sclera: Conjunctivae normal.     Pupils: Pupils are equal, round, and reactive to light.  Neck:     Thyroid: No thyromegaly.   Cardiovascular:     Rate and Rhythm: Normal rate and regular rhythm.  Pulmonary:     Effort: Pulmonary effort is normal.     Breath sounds: Normal breath sounds.  Chest:     Chest wall: Mass present.     Comments: Soft mobile mildly tender mass left chest wall along lateral thorax.  Abdominal:     General: Bowel sounds are normal.     Palpations: Abdomen is soft. There is no mass.     Tenderness: There is no abdominal tenderness.  Musculoskeletal:     Cervical back: Neck supple.     Right lower leg: No edema.     Left lower leg: No edema.  Lymphadenopathy:     Cervical: No cervical adenopathy.  Skin:    General: Skin is warm and dry.     Comments: Skin is with multiple excoriations and scars.  No secondary infection seen.    Neurological:     Mental Status: She is alert and oriented to person, place, and time.     Gait: Gait normal.  Psychiatric:        Behavior: Behavior normal. Behavior is cooperative.            Assessment & Plan:    Encounter Diagnoses  Name Primary?   Encounter to establish care Yes   Chest pain, unspecified type    Picking own skin    Obesity, unspecified classification, unspecified obesity type, unspecified whether serious comorbidity present    Cervical radiculopathy    Chronic right shoulder pain    S/P right rotator cuff repair    Mass of chest wall    Encounter for screening for malignant neoplasm of breast, unspecified screening modality    History of cocaine abuse ([place])    Nonintractable headache, unspecified chronicity pattern, unspecified headache type      Record requests sent to dr Derrill Kay (endocrinologist), Dr Broadus John (at [facility] [place]) and Deboraha Sprang, NP.     Chest mass -[place] left chest wall mass.  Discussed with pt likely lipoma  2. Cervical radiculopathy & chronic right shoulder pain -Refer to local orthopedist for neck issues and shoulder pain.  There are notes in care everywhere from when she saw orthopedist  (Dr. Merlene Pulling) in [place], most recently on 05-27-21.   3. Chronic pain -refer to pain mgt  4. 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  Requested that pt ask to have report sent to this office.  Will review MRI results prior to referral  5. Multinodular goiter -record request sent to Dr Derrill Kay (endocrinologist) whom pt says she saw for tumors on thyroid/goiter   6. MH issues: -pt encouraged to go to [facility] for continuation for MH meds since she takes many.  She will continue with her therapist for counseling that she receives via telephone.  Discussed with pt that counselor is available at [facility] if she is unable to continue with her current counselor.  7. CP -Refer cardiology for evaluation of chest pain    HCM: -refer for screening Mammogram -will discuss FIT testing for colon cancer screening at next appt   Will review records prior to ordering labs Pt is given cafa/application for [employer] financial assistance. Pt to follow up 2-3 weeks.  She is to contact office sooner prn

## 2021-09-15 ENCOUNTER — Other Ambulatory Visit: Payer: Self-pay

## 2021-09-15 ENCOUNTER — Other Ambulatory Visit: Payer: Self-pay | Admitting: Physician Assistant

## 2021-09-15 ENCOUNTER — Telehealth: Payer: Self-pay

## 2021-09-15 DIAGNOSIS — Z1231 Encounter for screening mammogram for malignant neoplasm of breast: Secondary | ICD-10-CM

## 2021-09-15 NOTE — Telephone Encounter (Signed)
Spoke with pt & informed of mammogram 10/03/21 11am, pt informed she had MRI done at [facility] in [place], [place].

## 2021-09-17 ENCOUNTER — Telehealth: Payer: Self-pay

## 2021-09-17 NOTE — Telephone Encounter (Signed)
Called pt to inform of [place] scheduled 09/24/21 at 1pm, left vm to call back.

## 2021-09-17 NOTE — Telephone Encounter (Signed)
Spoke with pt & infomed of [place] appt, also advised pt to have MRI results sent here, pt stated results will be available tomorrow, fax number given to have results sent to office.

## 2021-09-24 ENCOUNTER — Encounter: Payer: Self-pay | Admitting: Physician Assistant

## 2021-09-24 ENCOUNTER — Ambulatory Visit: Payer: Medicaid Other | Admitting: Physician Assistant

## 2021-09-24 ENCOUNTER — Other Ambulatory Visit: Payer: Self-pay

## 2021-09-24 ENCOUNTER — Ambulatory Visit (HOSPITAL_COMMUNITY)
Admission: RE | Admit: 2021-09-24 | Discharge: 2021-09-24 | Disposition: A | Discharge status: Home / Self Care | Payer: Self-pay | Source: Ambulatory Visit | Attending: Physician Assistant | Admitting: Physician Assistant

## 2021-09-24 VITALS — BP 151/98 | HR 95 | Temp 97.2°F | Wt 205.0 lb

## 2021-09-24 DIAGNOSIS — Z8639 Personal history of other endocrine, nutritional and metabolic disease: Secondary | ICD-10-CM

## 2021-09-24 DIAGNOSIS — M25511 Pain in right shoulder: Secondary | ICD-10-CM

## 2021-09-24 DIAGNOSIS — Z1211 Encounter for screening for malignant neoplasm of colon: Secondary | ICD-10-CM

## 2021-09-24 DIAGNOSIS — K219 Gastro-esophageal reflux disease without esophagitis: Secondary | ICD-10-CM

## 2021-09-24 DIAGNOSIS — F489 Nonpsychotic mental disorder, unspecified: Secondary | ICD-10-CM

## 2021-09-24 DIAGNOSIS — R079 Chest pain, unspecified: Secondary | ICD-10-CM

## 2021-09-24 DIAGNOSIS — R7303 Prediabetes: Secondary | ICD-10-CM

## 2021-09-24 DIAGNOSIS — R222 Localized swelling, mass and lump, trunk: Secondary | ICD-10-CM

## 2021-09-24 DIAGNOSIS — I10 Essential (primary) hypertension: Secondary | ICD-10-CM

## 2021-09-24 DIAGNOSIS — E785 Hyperlipidemia, unspecified: Secondary | ICD-10-CM

## 2021-09-24 DIAGNOSIS — M5412 Radiculopathy, cervical region: Secondary | ICD-10-CM

## 2021-09-24 DIAGNOSIS — E049 Nontoxic goiter, unspecified: Secondary | ICD-10-CM

## 2021-09-24 DIAGNOSIS — G8929 Other chronic pain: Secondary | ICD-10-CM

## 2021-09-24 MED ORDER — ALBUTEROL SULFATE HFA 108 (90 BASE) MCG/ACT IN AERS: 2.0000 | INHALATION_SPRAY | Freq: Four times a day (QID) | RESPIRATORY_TRACT | 0 refills | Status: DC | PRN

## 2021-09-24 MED ORDER — LOSARTAN POTASSIUM 50 MG PO TABS: 50.0000 mg | ORAL_TABLET | Freq: Every day | ORAL | 0 refills | Status: DC

## 2021-09-24 MED ORDER — OMEPRAZOLE 40 MG PO CPDR: 40.0000 mg | DELAYED_RELEASE_CAPSULE | Freq: Every day | ORAL | 1 refills | Status: DC

## 2021-09-24 NOTE — Progress Notes (Signed)
BP (!) 151/98    Pulse 95    Temp (!) 97.2 F (36.2 C)    Wt 205 lb (93 kg)    SpO2 98%    BMI 33.09 kg/m    Subjective:    Patient ID: Briana Wyatt, female    DOB: [birth year], 51 y.o.   MRN: [id number]  HPI: Briana Wyatt is a 51 y.o. female presenting on 09/24/2021 for Follow-up   HPI   Pt is 24yoF  with multiple medical problems seen today for follow up of her initial appointment on 09/10/21  She Has [place] this afternoon for soft tissue mass of the chest.   She Has screening mammogram appointment She Has appointment with cardiology scheduled in March  She has been seeing therapist for counseling over the telephone.  She has appointment with [facility] to see psychiatrist to get her MH medications.   She is ready to see counselor here at [facility].   She is using inhaler multiple times daily.  She quit smoking 20 years ago.  Question whether pt is using inhaler for sob due to pulmonary issues or anxiety.  Some of her requested records have arrived (from dr Verdell Carmine).  She had MRI head but results were not sent here.       Relevant past medical, surgical, family and social history reviewed and updated as indicated. Interim medical history since our last visit reviewed. Allergies and medications reviewed and updated.   Current Outpatient Medications:    albuterol (VENTOLIN HFA) 108 (90 Base) MCG/ACT inhaler, Inhale 2 puffs into the lungs every 6 (six) hours as needed for wheezing or shortness of breath., Disp: , Rfl:    baclofen (LIORESAL) 10 MG tablet, Take 10 mg by mouth 2 (two) times daily., Disp: , Rfl:    cetirizine (ZYRTEC) 10 MG tablet, Take 10 mg by mouth daily., Disp: , Rfl:    Cholecalciferol (VITAMIN D-3 PO), Take 1 tablet by mouth daily., Disp: , Rfl:    DULoxetine (CYMBALTA) 60 MG capsule, Take 60 mg by mouth 2 (two) times daily., Disp: , Rfl:    Multiple Vitamins-Minerals (CENTRUM SILVER PO), Take 1 tablet by mouth daily., Disp: , Rfl:    pregabalin (LYRICA) 100 MG  capsule, Take 100 mg by mouth 2 (two) times daily., Disp: , Rfl:    SUMAtriptan (IMITREX) 50 MG tablet, Take 50 mg by mouth every 2 (two) hours as needed for migraine., Disp: , Rfl:    carbamazepine (TEGRETOL) 200 MG tablet, Take 1 tablet (200 mg total) by mouth 2 (two) times daily. (Patient not taking: Reported on 09/07/2021), Disp: 60 tablet, Rfl: 0   pantoprazole (PROTONIX) 40 MG tablet, Take 40 mg by mouth daily. (Patient not taking: Reported on 09/10/2021), Disp: , Rfl:     Review of Systems  Per HPI unless specifically indicated above     Objective:    BP (!) 151/98    Pulse 95    Temp (!) 97.2 F (36.2 C)    Wt 205 lb (93 kg)    SpO2 98%    BMI 33.09 kg/m   Wt Readings from Last 3 Encounters:  09/24/21 205 lb (93 kg)  09/10/21 205 lb (93 kg)  09/07/21 205 lb (93 kg)    Physical Exam Vitals reviewed.  Constitutional:      General: She is not in acute distress.    Appearance: She is well-developed. She is not toxic-appearing.  HENT:     Head: Normocephalic and atraumatic.  Cardiovascular:     Rate and Rhythm: Normal rate and regular rhythm.  Pulmonary:     Effort: Pulmonary effort is normal.     Breath sounds: Normal breath sounds.  Abdominal:     General: Bowel sounds are normal.     Palpations: Abdomen is soft. There is no mass.     Tenderness: There is no abdominal tenderness.  Musculoskeletal:     Cervical back: Neck supple.  Lymphadenopathy:     Cervical: No cervical adenopathy.  Skin:    General: Skin is warm and dry.  Neurological:     Mental Status: She is alert and oriented to person, place, and time.  Psychiatric:        Behavior: Behavior is cooperative.           Assessment & Plan:      MH She has appt at [place] for psychiatrist to manage her [place] meds She is scheduled with [facility] for counseling at [facility]  Chest wall mass She has [place] today  CP She has appointment with cardiology  HCM She has appointment for screening mammogram She is  given FIT test for colon cancer screening  Goiter Check TSH Order thyroid US  Shoulder pain and cervical radiculopathy Referral was placed to orthopedics.  They are going to call pt to schedule Pt thinks she had mri shoulder at some point but doesn't think she has had any spine MRI   Jerrye Bushy -rx omeprazole as it is available at no cost through [facility] and pt was unable to get pantoprazole because it was not free   -Requested MRI results done 09-15-21 -Trying to get records from neuorlogist she saw at [facility] in 2020; dr thought she had MS or parkinson's or similar Also eval for migraine  Baseline labs ordered  Pt needs- PFTs to determine if she has pulmonary issue or if she is overusing her inhalers due to [place] issues.   For now will keep inhalers   HTN  Start losartan   Pt to follow up 1 month.  She is to contact office sooner for changes or other problems.

## 2021-09-24 NOTE — Patient Instructions (Addendum)
[email or web address]  Labs/blood work  Meds coming in Development worker, community

## 2021-10-01 ENCOUNTER — Ambulatory Visit: Payer: Medicaid Other | Admitting: Physician Assistant

## 2021-10-01 ENCOUNTER — Telehealth: Payer: Self-pay

## 2021-10-01 DIAGNOSIS — J069 Acute upper respiratory infection, unspecified: Secondary | ICD-10-CM

## 2021-10-01 DIAGNOSIS — Z20822 Contact with and (suspected) exposure to covid-19: Secondary | ICD-10-CM

## 2021-10-01 NOTE — Progress Notes (Signed)
There were no vitals taken for this visit.   Subjective:    Patient ID: Briana Wyatt, female    DOB: 11-22-1970, 51 y.o.   MRN: [id number]  HPI: Briana Wyatt is a 51 y.o. female presenting on 10/01/2021 for No chief complaint on file.   HPI   This is a telemedicine appointment through [employer].  I connected with  Briana Wyatt on 10/01/21 by a video enabled telemedicine application and verified that I am speaking with the correct person using two identifiers.   I discussed the limitations of evaluation and management by telemedicine. The patient expressed understanding and agreed to proceed.  Pt is at her mother's house.  Provider is at office.    Pt reports that she has been Sick for several days.  She says Her neices are sick.   She started feeling bad on Monday.   Pt reports Cough.  She says she has No fever.  Pt says she always has sob but it is shorter now.  She says her Left ear feels clogged but is not painful.  She complains of  Sore throart.  She just doing covid test now is negative.  She has HA also.    She is using her albuterol inhaler but says it doesn't really help.    She is living in a car/van.  She says she has a heater.  She says it's like a camper.   She says she hasn't spent the night in it yet because she has to clean it up.   She sometimes feels like she cant catch her breath but she is talking now without dyspnea or stopping for breath.    She took APAP and says it didn't help her HA.    Relevant past medical, surgical, family and social history reviewed and updated as indicated. Interim medical history since our last visit reviewed. Allergies and medications reviewed and updated.  Review of Systems  Per HPI unless specifically indicated above     Objective:    There were no vitals taken for this visit.  Wt Readings from Last 3 Encounters:  09/24/21 205 lb (93 kg)  09/10/21 205 lb (93 kg)  09/07/21 205 lb (93 kg)    Physical  Exam Constitutional:      General: She is not in acute distress.    Appearance: She is not toxic-appearing.  HENT:     Head: Normocephalic and atraumatic.     Nose: Congestion present.  Pulmonary:     Effort: Pulmonary effort is normal. No respiratory distress.     Comments: Pt is talking in complete sentences while walking around the room and is having no problems with dyspnea. Neurological:     Mental Status: She is alert and oriented to person, place, and time.  Psychiatric:        Attention and Perception: Attention normal.        Speech: Speech normal.          Assessment & Plan:   Encounter Diagnoses  Name Primary?   Suspected COVID-19 virus infection Yes   Upper respiratory tract infection, unspecified type      -pt was encouraged to use her albuterol inhaler as needed -she was encouraged to gargle with warm salt water every 2 or 3 hours as needed for sore throat -she is to use APAP prn HA -she is encouraged to get extra Rest and drink plenty of Fluids -she was urged to Stay warm and dry -pt to take  home covid test -pt was discussed reasons to go to ER like problems breathing or CP  Pt was notified of thyroid US on 10/15/21 and need to apply for cafa after the test.  Will reschedule her mammogram (appt 10/03/21) since she is sick

## 2021-10-01 NOTE — Telephone Encounter (Signed)
Spoke with pt & informed of new mammogram appt time.

## 2021-10-02 ENCOUNTER — Emergency Department (HOSPITAL_COMMUNITY)
Admission: EM | Admit: 2021-10-02 | Discharge: 2021-10-03 | Discharge status: Against Medical Advice | Payer: Medicaid Other | Attending: Emergency Medicine | Admitting: Emergency Medicine

## 2021-10-02 ENCOUNTER — Encounter (HOSPITAL_COMMUNITY): Payer: Self-pay | Admitting: *Deleted

## 2021-10-02 ENCOUNTER — Other Ambulatory Visit: Payer: Self-pay

## 2021-10-02 DIAGNOSIS — Z20822 Contact with and (suspected) exposure to covid-19: Secondary | ICD-10-CM | POA: Insufficient documentation

## 2021-10-02 DIAGNOSIS — Z5321 Procedure and treatment not carried out due to patient leaving prior to being seen by health care provider: Secondary | ICD-10-CM | POA: Insufficient documentation

## 2021-10-02 DIAGNOSIS — J029 Acute pharyngitis, unspecified: Secondary | ICD-10-CM | POA: Insufficient documentation

## 2021-10-02 LAB — GROUP A STREP BY PCR: Group A Strep by PCR: NOT DETECTED

## 2021-10-02 NOTE — ED Triage Notes (Signed)
Pt with c/o sore throat x 2 days. Yesterday did Covid test at home and was negative.  Bilateral ear pain, hurts to swallow.  Nasal congestion that is green, productive cough as well. Denies any fevers. + nausea

## 2021-10-03 ENCOUNTER — Ambulatory Visit (HOSPITAL_COMMUNITY): Payer: Medicaid Other

## 2021-10-03 LAB — RESP PANEL BY RT-PCR (FLU A&B, COVID) ARPGX2
Influenza A by PCR: NEGATIVE
Influenza B by PCR: NEGATIVE
SARS Coronavirus 2 by RT PCR: NEGATIVE

## 2021-10-06 ENCOUNTER — Inpatient Hospital Stay
Admission: RE | Admit: 2021-10-06 | Discharge: 2021-10-06 | Disposition: A | Discharge status: Home / Self Care | Payer: Self-pay | Source: Ambulatory Visit | Attending: Physician Assistant | Admitting: Physician Assistant

## 2021-10-06 ENCOUNTER — Other Ambulatory Visit: Payer: Self-pay | Admitting: Physician Assistant

## 2021-10-06 DIAGNOSIS — Z1231 Encounter for screening mammogram for malignant neoplasm of breast: Secondary | ICD-10-CM

## 2021-10-07 ENCOUNTER — Other Ambulatory Visit: Payer: Self-pay | Admitting: Physician Assistant

## 2021-10-07 DIAGNOSIS — Z1211 Encounter for screening for malignant neoplasm of colon: Secondary | ICD-10-CM

## 2021-10-07 LAB — IFOBT (OCCULT BLOOD): IFOBT: NEGATIVE

## 2021-10-08 ENCOUNTER — Ambulatory Visit: Payer: Medicaid Other | Admitting: Licensed Clinical Social Worker

## 2021-10-08 ENCOUNTER — Other Ambulatory Visit (HOSPITAL_COMMUNITY)
Admission: RE | Admit: 2021-10-08 | Discharge: 2021-10-08 | Disposition: A | Discharge status: Home / Self Care | Payer: Medicaid Other | Source: Ambulatory Visit | Attending: Physician Assistant | Admitting: Physician Assistant

## 2021-10-08 ENCOUNTER — Other Ambulatory Visit: Payer: Self-pay

## 2021-10-08 DIAGNOSIS — E785 Hyperlipidemia, unspecified: Secondary | ICD-10-CM

## 2021-10-08 DIAGNOSIS — Z8639 Personal history of other endocrine, nutritional and metabolic disease: Secondary | ICD-10-CM

## 2021-10-08 DIAGNOSIS — I10 Essential (primary) hypertension: Secondary | ICD-10-CM

## 2021-10-08 DIAGNOSIS — R7303 Prediabetes: Secondary | ICD-10-CM

## 2021-10-08 DIAGNOSIS — F32A Depression, unspecified: Secondary | ICD-10-CM

## 2021-10-08 DIAGNOSIS — F419 Anxiety disorder, unspecified: Secondary | ICD-10-CM

## 2021-10-08 DIAGNOSIS — E049 Nontoxic goiter, unspecified: Secondary | ICD-10-CM

## 2021-10-08 LAB — COMPREHENSIVE METABOLIC PANEL
ALT: 26 U/L (ref 0–44)
AST: 20 U/L (ref 15–41)
Albumin: 3.7 g/dL (ref 3.5–5.0)
Alkaline Phosphatase: 99 U/L (ref 38–126)
Anion gap: 11 (ref 5–15)
BUN: 25 mg/dL — ABNORMAL HIGH (ref 6–20)
CO2: 21 mmol/L — ABNORMAL LOW (ref 22–32)
Calcium: 9.6 mg/dL (ref 8.9–10.3)
Chloride: 109 mmol/L (ref 98–111)
Creatinine, Ser: 0.93 mg/dL (ref 0.44–1.00)
GFR, Estimated: >60 mL/min (ref >60)
Glucose, Bld: 109 mg/dL — ABNORMAL HIGH (ref 70–99)
Potassium: 4.6 mmol/L (ref 3.5–5.1)
Sodium: 141 mmol/L (ref 135–145)
Total Bilirubin: 0.3 mg/dL (ref 0.3–1.2)
Total Protein: 7.6 g/dL (ref 6.5–8.1)

## 2021-10-08 LAB — LIPID PANEL
Cholesterol: 153 mg/dL (ref 0–200)
HDL: 42 mg/dL (ref >40)
LDL Cholesterol: 90 mg/dL (ref 0–99)
Total CHOL/HDL Ratio: 3.6 RATIO
Triglycerides: 107 mg/dL (ref <150)
VLDL: 21 mg/dL (ref 0–40)

## 2021-10-08 LAB — TSH: TSH: 0.21 u[IU]/mL — ABNORMAL LOW (ref 0.350–4.500)

## 2021-10-08 LAB — HEMOGLOBIN A1C
Hgb A1c MFr Bld: 5.8 % — ABNORMAL HIGH (ref 4.8–5.6)
Mean Plasma Glucose: 119.76 mg/dL

## 2021-10-08 NOTE — Progress Notes (Signed)
[facility] engaged in initial [facility] session with patient. [facility] provided active listening and validation as patient shared about past traumas/addiction, interpersonal conflict, and current symptoms and stressors. No SI/HI. Sessions will be biweekly.

## 2021-10-10 ENCOUNTER — Encounter (HOSPITAL_COMMUNITY): Payer: Self-pay

## 2021-10-10 ENCOUNTER — Other Ambulatory Visit: Payer: Self-pay

## 2021-10-10 ENCOUNTER — Ambulatory Visit (HOSPITAL_COMMUNITY)
Admission: RE | Admit: 2021-10-10 | Discharge: 2021-10-10 | Disposition: A | Discharge status: Home / Self Care | Payer: Self-pay | Source: Ambulatory Visit | Attending: Physician Assistant | Admitting: Physician Assistant

## 2021-10-10 DIAGNOSIS — Z1231 Encounter for screening mammogram for malignant neoplasm of breast: Secondary | ICD-10-CM | POA: Insufficient documentation

## 2021-10-14 ENCOUNTER — Inpatient Hospital Stay
Admission: RE | Admit: 2021-10-14 | Discharge: 2021-10-14 | Disposition: A | Discharge status: Home / Self Care | Payer: Self-pay | Source: Ambulatory Visit | Attending: Physician Assistant | Admitting: Physician Assistant

## 2021-10-14 ENCOUNTER — Other Ambulatory Visit: Payer: Self-pay | Admitting: Physician Assistant

## 2021-10-14 DIAGNOSIS — Z1231 Encounter for screening mammogram for malignant neoplasm of breast: Secondary | ICD-10-CM

## 2021-10-15 ENCOUNTER — Ambulatory Visit (HOSPITAL_COMMUNITY)
Admission: RE | Admit: 2021-10-15 | Discharge: 2021-10-15 | Disposition: A | Discharge status: Home / Self Care | Payer: Self-pay | Source: Ambulatory Visit | Attending: Physician Assistant | Admitting: Physician Assistant

## 2021-10-15 ENCOUNTER — Other Ambulatory Visit: Payer: Self-pay

## 2021-10-15 DIAGNOSIS — E049 Nontoxic goiter, unspecified: Secondary | ICD-10-CM | POA: Insufficient documentation

## 2021-10-22 ENCOUNTER — Ambulatory Visit: Payer: Medicaid Other | Admitting: Licensed Clinical Social Worker

## 2021-10-22 ENCOUNTER — Encounter: Payer: Self-pay | Admitting: Physician Assistant

## 2021-10-22 ENCOUNTER — Ambulatory Visit: Payer: Medicaid Other | Admitting: Physician Assistant

## 2021-10-22 VITALS — BP 136/97 | HR 55 | Temp 98.0°F | Wt 204.0 lb

## 2021-10-22 DIAGNOSIS — G8929 Other chronic pain: Secondary | ICD-10-CM

## 2021-10-22 DIAGNOSIS — F32A Depression, unspecified: Secondary | ICD-10-CM

## 2021-10-22 DIAGNOSIS — F419 Anxiety disorder, unspecified: Secondary | ICD-10-CM

## 2021-10-22 DIAGNOSIS — E042 Nontoxic multinodular goiter: Secondary | ICD-10-CM

## 2021-10-22 DIAGNOSIS — R079 Chest pain, unspecified: Secondary | ICD-10-CM

## 2021-10-22 DIAGNOSIS — M5412 Radiculopathy, cervical region: Secondary | ICD-10-CM

## 2021-10-22 DIAGNOSIS — R9082 White matter disease, unspecified: Secondary | ICD-10-CM

## 2021-10-22 DIAGNOSIS — E0590 Thyrotoxicosis, unspecified without thyrotoxic crisis or storm: Secondary | ICD-10-CM

## 2021-10-22 DIAGNOSIS — Z5989 Other problems related to housing and economic circumstances: Secondary | ICD-10-CM

## 2021-10-22 MED ORDER — GABAPENTIN 300 MG PO CAPS: 300.0000 mg | ORAL_CAPSULE | Freq: Three times a day (TID) | ORAL | 0 refills | Status: DC

## 2021-10-22 NOTE — Progress Notes (Signed)
? ? ?BP (!) 136/97   Pulse (!) 55   Temp 98 ?F (36.7 ?C)   Wt 204 lb (92.5 kg)   SpO2 93%   BMI 32.93 kg/m?   ? ?Subjective:  ? ? Patient ID: Briana Wyatt, female    DOB: January 31, 1971, 51 y.o.   MRN: [id number] ? ?HPI: ?Briana Wyatt is a 51 y.o. female presenting on 10/22/2021 for Follow-up ? ? ?HPI ? ? ? ?Chief Complaint  ?Patient presents with  ? Follow-up  ? ? ?Pt is 58yoF with follow up appointment for many issues.   ? ?She has still not submitted her cafa.  She says she is wating on tax forms. ? ?She is now complaining of left foot hurts.  She says she sprained it in June 2021.  It has been hurting ever since.  She says she got an [place] and they told her she has 3 tendons.   ? ?Pt did not get mammogram because when she showed up for her screening test she was c/o lump . ? ?She says her gerd is good now that she is taking the omeprazole. ? ?She Feels like she has something in the left ear.  Always.  And it is aggrevating.  This has been going for years.  She went to [place]NT in [place].  She says they did some testing and didn't find anything. ? ?She went to [facility].  She says the psychiatrist was confrontational and she didn't like that.  She says she left the appointment in tears.  She was prescribed celexa which she took for four days only and she stopped it because it made her eat all the time.  She says he used profanities throughout the appointment.     ? ? ? ?Relevant past medical, surgical, family and social history reviewed and updated as indicated. Interim medical history since our last visit reviewed. ?Allergies and medications reviewed and updated. ? ? ?Current Outpatient Medications:  ?  albuterol (VENTOLIN HFA) 108 (90 Base) MCG/ACT inhaler, Inhale 2 puffs into the lungs every 6 (six) hours as needed for wheezing or shortness of breath., Disp: 3 each, Rfl: 0 ?  baclofen (LIORESAL) 10 MG tablet, Take 10 mg by mouth 2 (two) times daily., Disp: , Rfl:  ?  cetirizine (ZYRTEC) 10 MG tablet,  Take 10 mg by mouth daily., Disp: , Rfl:  ?  Cholecalciferol (VITAMIN D-3 PO), Take 1 tablet by mouth daily., Disp: , Rfl:  ?  losartan (COZAAR) 50 MG tablet, Take 1 tablet (50 mg total) by mouth daily., Disp: 90 tablet, Rfl: 0 ?  Multiple Vitamins-Minerals (CENTRUM SILVER PO), Take 1 tablet by mouth daily., Disp: , Rfl:  ?  omeprazole (PRILOSEC) 40 MG capsule, Take 1 capsule (40 mg total) by mouth daily., Disp: 90 capsule, Rfl: 1 ?  carbamazepine (TEGRETOL) 200 MG tablet, Take 1 tablet (200 mg total) by mouth 2 (two) times daily. (Patient not taking: Reported on 09/07/2021), Disp: 60 tablet, Rfl: 0 ?  DULoxetine (CYMBALTA) 60 MG capsule, Take 60 mg by mouth 2 (two) times daily. (Patient not taking: Reported on 10/22/2021), Disp: , Rfl:  ?  pregabalin (LYRICA) 100 MG capsule, Take 100 mg by mouth 2 (two) times daily. (Patient not taking: Reported on 10/22/2021), Disp: , Rfl:  ?  SUMAtriptan (IMITREX) 50 MG tablet, Take 50 mg by mouth every 2 (two) hours as needed for migraine. (Patient not taking: Reported on 10/22/2021), Disp: , Rfl:  ? ? ? ?Review of Systems ? ?  Per HPI unless specifically indicated above ? ?   ?Objective:  ?  ?BP (!) 136/97   Pulse (!) 55   Temp 98 ?F (36.7 ?C)   Wt 204 lb (92.5 kg)   SpO2 93%   BMI 32.93 kg/m?   ?Wt Readings from Last 3 Encounters:  ?10/22/21 204 lb (92.5 kg)  ?09/24/21 205 lb (93 kg)  ?09/10/21 205 lb (93 kg)  ?  ?Physical Exam ?Vitals reviewed.  ?Constitutional:   ?   General: She is not in acute distress. ?   Appearance: She is well-developed. She is obese. She is not toxic-appearing.  ?HENT:  ?   Head: Normocephalic and atraumatic.  ?   Right Ear: Tympanic membrane, ear canal and external ear normal.  ?   Left Ear: Tympanic membrane, ear canal and external ear normal.  ?Cardiovascular:  ?   Rate and Rhythm: Normal rate and regular rhythm.  ?Pulmonary:  ?   Effort: Pulmonary effort is normal.  ?   Breath sounds: Normal breath sounds.  ?Chest:  ?   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  The entire are is tender with no point tenderness. ?Abdominal:  ?   General: Bowel sounds are normal.  ?   Palpations: Abdomen is soft. There is no mass.  ?   Tenderness: There is no abdominal tenderness.  ?Musculoskeletal:  ?   Cervical back: Neck supple.  ?   Right foot: Normal.  ?   Left foot: Normal range of motion and normal capillary refill. Tenderness present. No swelling, deformity or bony tenderness. Normal pulse.  ?   Comments: Left foot with tenderness all over until distracted at which point tenderness seems in area of distal 4th/5th MTs on plantar surface.    ?Lymphadenopathy:  ?   Cervical: No cervical adenopathy.  ?Skin: ?   General: Skin is warm and dry.  ?Neurological:  ?   Mental Status: She is alert and oriented to person, place, and time.  ?Psychiatric:     ?   Behavior: Behavior normal.  ? ? ?Results for orders placed or performed during the hospital encounter of 10/08/21  ?TSH  ?Result Value Ref Range  ? TSH 0.210 (L) 0.350 - 4.500 uIU/mL  ?Hemoglobin A1c  ?Result Value Ref Range  ? Hgb A1c MFr Bld 5.8 (H) 4.8 - 5.6 %  ? Mean Plasma Glucose 119.76 mg/dL  ?Lipid panel  ?Result Value Ref Range  ? Cholesterol 153 0 - 200 mg/dL  ? Triglycerides 107 <150 mg/dL  ? HDL 42 >40 mg/dL  ? Total CHOL/HDL Ratio 3.6 RATIO  ? VLDL 21 0 - 40 mg/dL  ? LDL Cholesterol 90 0 - 99 mg/dL  ?Comprehensive metabolic panel  ?Result Value Ref Range  ? Sodium 141 135 - 145 mmol/L  ? Potassium 4.6 3.5 - 5.1 mmol/L  ? Chloride 109 98 - 111 mmol/L  ? CO2 21 (L) 22 - 32 mmol/L  ? Glucose, Bld 109 (H) 70 - 99 mg/dL  ? BUN 25 (H) 6 - 20 mg/dL  ? Creatinine, Ser 0.93 0.44 - 1.00 mg/dL  ? Calcium 9.6 8.9 - 10.3 mg/dL  ? Total Protein 7.6 6.5 - 8.1 g/dL  ? Albumin 3.7 3.5 - 5.0 g/dL  ? AST 20 15 - 41 U/L  ? ALT 26 0 - 44 U/L  ? Alkaline Phosphatase 99 38 - 126 U/L  ? Total Bilirubin 0.3 0.3 -  1.2 mg/dL  ? GFR, Estimated >60 >60 mL/min  ? Anion gap 11 5 - 15  ? ?    ?Assessment & Plan:  ? ? ?Encounter Diagnoses  ?Name Primary?  ? White matter abnormality on MRI of brain Yes  ? Hyperthyroidism   ? Multiple thyroid nodules   ? Depression, unspecified depression type   ? Anxiety   ? Chronic right shoulder pain   ? Cervical radiculopathy   ? Chest pain, unspecified type   ? Does not have health insurance   ? ? ? ?-Pt urged to submit cafa/application for [employer] financial assistance asap ? ?-Reviewed labs with pt ? ?-Reviewed thyroid US with pt. Will Refer to endocrinology for hypertyroidism and thyroid nodules. ? ?-She has appointment with orthopedics for Shoulder and neck pain.  She can ask if he can look at her foot while she's there.   ? ?-She has appt with cardiology 3/22 for CP. ? ?-will refer for diagnostic Mammogram  ? ?-She is continuing with [facility] for counseling.  Pt to see if she can return to [facility] sooner than her appt in may to discuss rx other than citalopram.  This office Will inquire to see if there is a psychiatrist somewhere else that can see her since she had such a negative experience.   ? ?Discussed MRI brain done 09/15/21 which shows white matter hyperintensities and recommends to correlate for demyelinaintg conditions.  Pt needs referral to neurology.  Discussed options of going to [facility] to see [facility] affiliated provider or going to [facility] to see [facility] affiliated provider and she prefers to go to [facility].  She was approved for [facility] financial assistance.  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 ?-Reviewed [place] chest mass which didn't show anything.  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  Will return to this issue in the future ? ? ?-Add gabapentin ? ?Pt to RTO 1 month to see how gabapentin is working and make sure plans moving along.  She is to contact office sooner prn ? ?(Spent 55 minutes with pt) ?

## 2021-10-22 NOTE — Progress Notes (Signed)
[facility] engaged patient in follow-up appointment. [facility] provided active listening and validation as patient shared about current stressors, symptoms, and interpersonal conflicts. Patient presented with passive SI, [facility] assessed safety, contracted with patient, and safety planned. Protective statements included, "I wouldn't do it because I don't want to go to hell."  ?

## 2021-10-27 ENCOUNTER — Other Ambulatory Visit (HOSPITAL_COMMUNITY): Payer: Self-pay | Admitting: Obstetrics and Gynecology

## 2021-10-27 DIAGNOSIS — N6320 Unspecified lump in the left breast, unspecified quadrant: Secondary | ICD-10-CM

## 2021-10-28 ENCOUNTER — Ambulatory Visit: Payer: Medicaid Other | Admitting: Orthopedic Surgery

## 2021-10-29 ENCOUNTER — Other Ambulatory Visit: Payer: Self-pay

## 2021-10-29 ENCOUNTER — Ambulatory Visit: Payer: Medicaid Other | Admitting: Licensed Clinical Social Worker

## 2021-10-29 DIAGNOSIS — F419 Anxiety disorder, unspecified: Secondary | ICD-10-CM

## 2021-10-29 DIAGNOSIS — F32A Depression, unspecified: Secondary | ICD-10-CM

## 2021-10-29 NOTE — Progress Notes (Signed)
[facility] engaged patient in follow-up session. Patient presented with agrivated affect and angry/depressed mood. Patient displayed fidgeting behaviors and high level of motor tension. [facility] provided empathetic presence and validation as patient shared about current stressors, symptoms, and interpersonal conflict. Patient requested to end session after 30 minutes due to oncoming migraine. Next session was scheduled for one week and after that sessions will resume at biweekly pace.  ?

## 2021-11-03 ENCOUNTER — Ambulatory Visit (INDEPENDENT_AMBULATORY_CARE_PROVIDER_SITE_OTHER): Payer: Self-pay | Admitting: Nurse Practitioner

## 2021-11-03 ENCOUNTER — Encounter: Payer: Self-pay | Admitting: Nurse Practitioner

## 2021-11-03 ENCOUNTER — Other Ambulatory Visit: Payer: Self-pay

## 2021-11-03 VITALS — BP 119/82 | HR 84 | Ht 65.0 in | Wt 200.4 lb

## 2021-11-03 DIAGNOSIS — R61 Generalized hyperhidrosis: Secondary | ICD-10-CM

## 2021-11-03 DIAGNOSIS — R7989 Other specified abnormal findings of blood chemistry: Secondary | ICD-10-CM

## 2021-11-03 DIAGNOSIS — E041 Nontoxic single thyroid nodule: Secondary | ICD-10-CM

## 2021-11-03 HISTORY — DX: Generalized hyperhidrosis: R61

## 2021-11-03 NOTE — Progress Notes (Signed)
11/03/2021     Endocrinology Consult Note    Subjective:    Patient ID: Briana Wyatt, female    DOB: 24-Jul-1971, PCP Briana Dryer, PA-C.   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   Hypertension    Restless leg syndrome    Sciatica of right side     Past Surgical History:  Procedure Laterality Date   CARPAL TUNNEL RELEASE Bilateral    ECTOPIC PREGNANCY SURGERY     SHOULDER SURGERY Right    TUBAL LIGATION      Social History   Socioeconomic History   Marital status: Single    Spouse name: Not on file   Number of children: Not on file   Years of education: Not on file   Highest education level: Not on file  Occupational History   Not on file  Tobacco Use   Smoking status: Former    Types: Cigarettes    Quit date: 08/25/1995    Years since quitting: 26.2   Smokeless tobacco: Never  Vaping Use   Vaping Use: Never used  Substance and Sexual Activity   Alcohol use: No   Drug use: Not Currently    Types: Marijuana, Cocaine    Comment: denies use 10/02/21   Sexual activity: Not Currently    Birth control/protection: Surgical  Other Topics Concern   Not on file  Social History Narrative   Not on file   Social Determinants of Health   Financial Resource Strain: Not on file  Food Insecurity: Not on file  Transportation Needs: Not on file  Physical Activity: Not on file  Stress: Not on file  Social Connections: Not on file    Family History  Problem Relation Age of Onset   Hypertension Father    Stroke Father     Outpatient Encounter Medications as of 11/03/2021  Medication Sig   acetaminophen (TYLENOL) 650 MG CR tablet Take by mouth.   albuterol (VENTOLIN HFA) 108 (90 Base) MCG/ACT inhaler Inhale 2 puffs into the lungs every 6 (six) hours as needed for wheezing or shortness of breath.   baclofen (LIORESAL) 10 MG tablet Take 10 mg by mouth 2  (two) times daily.   cetirizine (ZYRTEC) 10 MG tablet Take 10 mg by mouth daily.   Cholecalciferol (VITAMIN D-3 PO) Take 1 tablet by mouth daily.   DULoxetine (CYMBALTA) 60 MG capsule Take by mouth.   gabapentin (NEURONTIN) 300 MG capsule Take 1 capsule (300 mg total) by mouth 3 (three) times daily.   losartan (COZAAR) 50 MG tablet Take 1 tablet (50 mg total) by mouth daily.   Multiple Vitamins-Minerals (CENTRUM SILVER PO) Take 1 tablet by mouth daily.   omeprazole (PRILOSEC) 40 MG capsule Take 1 capsule (40 mg total) by mouth daily.   [DISCONTINUED] busPIRone (BUSPAR) 10 MG tablet TAKE 1/2 TABLET BY MOUTH TWICE DAILY FOR ONE WEEK, THEN INCREASE TO ONE TABLET TWICE DAILY (Patient not taking: Reported on 11/03/2021)   [DISCONTINUED] propranolol (INDERAL) 40 MG tablet Take by mouth. (Patient not taking: Reported on 11/03/2021)   [DISCONTINUED] SUMAtriptan (IMITREX) 50 MG tablet  See Instructions, TAKE 1 TABLET BY MOUTH AT ONSET OF MIGRAINE. MAY REPEAT ONCE AFTER 2 HOURS IF NEEDED, MAX FOUR TABLETS PER DAY, # 9 tab, 2 Refill(s), Pharmacy: [facility] (Patient not taking: Reported on 11/03/2021)   No facility-administered encounter medications  on file as of 11/03/2021.    ALLERGIES: Allergies  Allergen Reactions   Codeine Nausea And Vomiting    High doses of codeine pt reports greater than '30mg'$  she can't tolerate    Methocarbamol Itching    "feels like bugs are crawling under skin"   Nsaids     Other reaction(s): Contraindicated CKD, only one kidney Other reaction(s): Contraindicated CKD, only one kidney    VACCINATION STATUS: Immunization History  Administered Date(s) Administered   Moderna SARS-COV2 Booster Vaccination 06/27/2020   Moderna Sars-Covid-2 Vaccination 12/14/2019, 01/18/2020     HPI  Briana Wyatt is 51 y.o. female who presents today with a medical history as above. she is being seen in consultation for hyperthyroidism and multinodular thyroid  requested by Briana Dryer, PA-C.  she has been dealing with symptoms of heat intolerance, anxiety, difficulty swallowing, difficulty breathing while laying down, and palpitations on/off since 2009. These symptoms are progressively worsening and troubling to her.  her most recent thyroid labs revealed mildly suppressed TSH of 0.21 on 10/08/21 (no corresponding FreeT4 done at that time).  she denies choking, no recent voice change.  She does have associated dysphagia and shortness of breath.   she does have family history of thyroid dysfunction in her mother (hypothyroidism), but denies family hx of thyroid cancer. She had been seeing endocrinologist in [place] but moved back to [place] about 1 year ago.  She previously had ultrasound in 2009 and had a large nodule on right gland biopsied (unsure of exact location) which was benign.  She has also been on Methimazole in the past, not currently on any medication.   she is not on any anti-thyroid medications nor on any thyroid hormone supplements. Denies use of Biotin containing supplements.  she is willing to proceed with appropriate work up and therapy for thyrotoxicosis and MNG.   Review of systems  Constitutional: + Minimally fluctuating body weight, current Body mass index is 33.35 kg/m., no fatigue, + subjective hyperthermia, no subjective hypothermia Eyes: no blurry vision, no xerophthalmia ENT: no sore throat, + nodules palpated in throat, + dysphagia, no odynophagia, no hoarseness Cardiovascular: no chest pain, + shortness of breath, + palpitations, no leg swelling Respiratory: no cough, + shortness of breath Gastrointestinal: no nausea/vomiting/diarrhea Musculoskeletal: c/o right shoulder pain-chronic Skin: no rashes, no hyperemia Neurological: no tremors, no numbness, no tingling, no dizziness Psychiatric: no depression, + anxiety   Objective:    BP 119/82    Pulse 84    Ht '5\' 5"'$  (1.651 m)    Wt 200 lb 6.4 oz (90.9 kg)    SpO2 97%    BMI  33.35 kg/m   Wt Readings from Last 3 Encounters:  11/03/21 200 lb 6.4 oz (90.9 kg)  10/22/21 204 lb (92.5 kg)  09/24/21 205 lb (93 kg)     BP Readings from Last 3 Encounters:  11/03/21 119/82  10/22/21 (!) 136/97  09/24/21 (!) 151/98                        Physical Exam- Limited  Constitutional:  Body mass index is 33.35 kg/m. , not in acute distress, moderately anxious state of mind, fidgety Eyes:  EOMI, no exophthalmos Neck: Supple Thyroid: + gross goiter R>L with palpable right sided nodule; tenderness to palpation Cardiovascular: RRR, no murmurs, rubs, or gallops, no edema Respiratory: Adequate breathing efforts, no crackles, rales, rhonchi, or wheezing Musculoskeletal: no gross deformities, strength intact in all four extremities, no gross  restriction of joint movements Skin:  no rashes, no hyperemia, + multiple scabbed areas to BUE and BLE in various stages of healing Neurological: no tremor with outstretched hands   CMP     Component Value Date/Time   NA 141 10/08/2021 1232   K 4.6 10/08/2021 1232   CL 109 10/08/2021 1232   CO2 21 (L) 10/08/2021 1232   GLUCOSE 109 (H) 10/08/2021 1232   BUN 25 (H) 10/08/2021 1232   CREATININE 0.93 10/08/2021 1232   CALCIUM 9.6 10/08/2021 1232   PROT 7.6 10/08/2021 1232   ALBUMIN 3.7 10/08/2021 1232   AST 20 10/08/2021 1232   ALT 26 10/08/2021 1232   ALKPHOS 99 10/08/2021 1232   BILITOT 0.3 10/08/2021 1232   GFRNONAA >60 10/08/2021 1232     CBC    Component Value Date/Time   WBC 5.8 09/07/2021 1527   RBC 4.71 09/07/2021 1527   HGB 13.0 09/07/2021 1527   HCT 40.6 09/07/2021 1527   PLT 257 09/07/2021 1527   MCV 86.2 09/07/2021 1527   MCH 27.6 09/07/2021 1527   MCHC 32.0 09/07/2021 1527   RDW 11.9 09/07/2021 1527   LYMPHSABS 0.7 07/09/2021 1230   MONOABS 0.5 07/09/2021 1230   EOSABS 0.1 07/09/2021 1230   BASOSABS 0.0 07/09/2021 1230     Diabetic Labs (most recent): Lab Results  Component Value Date   HGBA1C 5.8  (H) 10/08/2021   HGBA1C 5.9 (H) 09/17/2020    Lipid Panel     Component Value Date/Time   CHOL 153 10/08/2021 1232   TRIG 107 10/08/2021 1232   HDL 42 10/08/2021 1232   CHOLHDL 3.6 10/08/2021 1232   VLDL 21 10/08/2021 1232   LDLCALC 90 10/08/2021 1232     Lab Results  Component Value Date   TSH 0.210 (L) 10/08/2021   TSH 0.807 09/17/2020     Thyroid US from 10/15/21 CLINICAL DATA:  Goiter.   EXAM: THYROID ULTRASOUND   TECHNIQUE: Ultrasound examination of the thyroid gland and adjacent soft tissues was performed.   COMPARISON:  None.   FINDINGS: Parenchymal Echotexture: Moderately heterogenous   Isthmus: 0.3 cm   Right lobe: 5.4 x 2.3 x 2.4 cm   Left lobe: 2.9 x 1.2 x 0.7 cm   _________________________________________________________   Estimated total number of nodules >/= 1 cm: 2   Number of spongiform nodules >/=  2 cm not described below (TR1): 0   Number of mixed cystic and solid nodules >/= 1.5 cm not described below (TR2): 0   _________________________________________________________   Nodule # 1:   Location: Right; Superior   Maximum size: 3.3 cm; Other 2 dimensions: 2.7 x 2.0 cm   Composition: mixed cystic and solid (1)   Echogenicity: hypoechoic (2)   Shape: not taller-than-wide (0)   Margins: ill-defined (0)   Echogenic foci: none (0)   ACR TI-RADS total points: 3.   ACR TI-RADS risk category: TR3 (3 points).   ACR TI-RADS recommendations:   **Given size (>/= 2.5 cm) and appearance, fine needle aspiration of this mildly suspicious nodule should be considered based on TI-RADS criteria.   _________________________________________________________   Nodule # 2:   Location: Right; Inferior   Maximum size: 2.3 cm; Other 2 dimensions: 2.1 x 1.5 cm   Composition: mixed cystic and solid (1)   Echogenicity: isoechoic (1)   Shape: not taller-than-wide (0)   Margins: ill-defined (0)   Echogenic foci: none (0)   ACR TI-RADS  total points: 2.   ACR  TI-RADS risk category: TR2 (2 points).   ACR TI-RADS recommendations:   This nodule does NOT meet TI-RADS criteria for biopsy or dedicated follow-up.   _________________________________________________________   There are 2 additional 0.6 cm less hypoechoic nodules in the left inferior thyroid which appear definitively benign and do not warrant follow-up.   IMPRESSION: 1. Multinodular goiter. 2. Solid-cystic nodule in the right superior thyroid (labeled 1, 3.3 cm) meets criteria (TI-RADS category 3) for tissue sampling. Recommend ultrasound-guided fine-needle aspiration. 3. The remaining visualized bilateral thyroid nodules appear benign and do not warrant additional follow-up.   The above is in keeping with the ACR TI-RADS recommendations - J Am Coll Radiol 2017;14:587-595.   Ruthann Cancer, MD   [facility]   [facility]     Electronically Signed   By: Ruthann Cancer M.D.   On: 10/15/2021 15:58   Assessment & Plan:   1. Thyroid nodule - I reviewed the images of her thyroid ultrasound along with the patient. I pointed out that the dominant nodules are large, this being a risk factor for cancer.  Otherwise, the nodules are: - not hypoechoic - without microcalcifications - without internal blood flow - more wide than tall - well delimited from surrounding tissue  Pt does not have a thyroid cancer family history or a personal history of RxTx to head/neck. All these would favor benignity.  - the only way that we can tell exactly if it is cancer or not is by doing a thyroid biopsy (FNA). I explained what the test entails.  She has had this test done in the past but I could find no documentation of where that nodule was, therefore we decided to proceed with ordering FNA of right superior thyroid nodule.   2. Abnormal TSH  she is being seen at a kind request of Briana Dryer, PA-C.  her history  and most recent labs are reviewed, and she was examined clinically. Subjective and objective findings are inconsistent with thyrotoxicosis from primary hyperthyroidism. However, the potential risks of untreated thyrotoxicosis and the need for definitive therapy have been discussed in detail with her, and she agrees to proceed with diagnostic workup and treatment plan.   I will repeat full profile thyroid function tests today, including antibody testing to assess for autoimmune thyroid dysfunction.  Will hold off on uptake and scan until these labs come back and until she has her thyroid nodule biopsied which will help determine which treatment would be best.  She has been on Methimazole in the past.  The records I was able to locate from her previous endocrinology practice was vague and did not include lab values.  She was noted to be on Methimazole long-term dating back to 09/2019.      she will return in 1 month for treatment decision.  Will reach out to her if labs come back significantly abnormal to discuss how to proceed.     -Patient is advised to maintain close follow up with Briana Dryer, PA-C for primary care needs.   - Time spent with the patient: 60 minutes, of which >50% was spent in obtaining information about her symptoms, reviewing her previous labs, evaluations, and treatments, counseling her about her hyperthyroidism , and developing a plan to confirm the diagnosis and long term treatment as necessary. Please refer to "Patient Self Inventory" in the Media tab for reviewed elements of pertinent patient history.  Briana Wyatt participated in the discussions, expressed understanding, and voiced agreement with the  above plans.  All questions were answered to her satisfaction. she is encouraged to contact clinic should she have any questions or concerns prior to her return visit.   Follow up plan: Return in about 1 month (around 12/04/2021) for Thyroid follow up, Previsit labs  and biopsy.   Thank you for involving me in the care of this pleasant patient, and I will continue to update you with her progress.    Rayetta Pigg, [facility] [address] [place], [place] [address] Phone: [phone] Fax: [phone]  11/03/2021, 3:16 PM

## 2021-11-03 NOTE — Patient Instructions (Signed)
Thyroid Nodule ?A thyroid nodule is an isolated growth of thyroid cells that forms a lump in your thyroid gland. The thyroid gland is a butterfly-shaped gland. It is found in the lower front of your neck. This gland sends chemical messengers (hormones) through your blood to all parts of your body. These hormones are important in regulating your body temperature and helping your body to use energy. ?Thyroid nodules are common. Most are not cancerous (benign). You may have one nodule or several nodules. ?Different types of thyroid nodules include nodules that: ?Grow and fill with fluid (thyroid cysts). ?Produce too much thyroid hormone (hot nodules or hyperthyroid). ?Produce no thyroid hormone (cold nodules or hypothyroid). ?Form from cancer cells (thyroid cancers). ?What are the causes? ?In most cases, the cause of this condition is not known. ?What increases the risk? ?The following factors may make you more likely to develop this condition. ?Age. Thyroid nodules become more common in people who are older than 53 years of age. ?Gender. ?Benign thyroid nodules are more common in women. ?Cancerous (malignant) thyroid nodules are more common in men. ?A family history that includes: ?Thyroid nodules. ?Pheochromocytoma. ?Thyroid carcinoma. ?Hyperparathyroidism. ?Certain kinds of thyroid diseases, such as Hashimoto's thyroiditis. ?Lack of iodine in your diet. ?A history of head and neck radiation, such as from previous cancer treatment. ?What are the signs or symptoms? ?In many cases, there are no symptoms. If you have symptoms, they may include: ?A lump in your lower neck. ?Feeling a lump or tickle in your throat. ?Pain in your neck, jaw, or ear. ?Having trouble swallowing. ?Hot nodules may cause symptoms that include: ?Weight loss. ?Warm, flushed skin. ?Feeling hot. ?Feeling nervous. ?A racing heartbeat. ?Cold nodules may cause symptoms that include: ?Weight gain. ?Dry skin. ?Brittle hair. This may also occur with hair  loss. ?Feeling cold. ?Fatigue. ?Thyroid cancer nodules may cause symptoms that include: ?Hard nodules that feel stuck to the thyroid gland. ?Hoarseness. ?Lumps in the glands near your thyroid (lymph nodes). ?How is this diagnosed? ?A thyroid nodule may be felt by your health care provider during a physical exam. This condition may also be diagnosed based on your symptoms. You may also have tests, including: ?An ultrasound. This may be done to confirm the diagnosis. ?A biopsy. This involves taking a sample from the nodule and looking at it under a microscope. ?Blood tests to make sure that your thyroid is working properly. ?A thyroid scan. This test uses a radioactive tracer injected into a vein to create an image of the thyroid gland on a computer screen. ?Imaging tests such as MRI or CT scan. These may be done if: ?Your nodule is large. ?Your nodule is blocking your airway. ?Cancer is suspected. ?How is this treated? ?Treatment depends on the cause and size of your nodule or nodules. If the nodule is benign, treatment may not be necessary. Your health care provider may monitor the nodule to see if it goes away without treatment. If the nodule continues to grow, is cancerous, or does not go away, treatment may be needed. Treatment may include: ?Having a cystic nodule drained with a needle. ?Ablation therapy. In this treatment, alcohol is injected into the area of the nodule to destroy the cells. Ablation with heat (thermal ablation) may also be used. ?Radioactive iodine. In this treatment, radioactive iodine is given as a pill or liquid that you drink. This substance causes the thyroid nodule to shrink. ?Surgery to remove the nodule. Part or all of your thyroid gland may  need to be removed as well. ?Medicines. ?Follow these instructions at home: ?Pay attention to any changes in your nodule. ?Take over-the-counter and prescription medicines only as told by your health care provider. ?Keep all follow-up visits as told  by your health care provider. This is important. ?Contact a health care provider if: ?Your voice changes. ?You have trouble swallowing. ?You have pain in your neck, ear, or jaw that is getting worse. ?Your nodule gets bigger. ?Your nodule starts to make it harder for you to breathe. ?Your muscles look like they are shrinking (muscle wasting). ?Get help right away if: ?You have chest pain. ?There is a loss of consciousness. ?You have a sudden fever. ?You feel confused. ?You are seeing or hearing things that other people do not see or hear (having hallucinations). ?You feel very weak. ?You have mood swings. ?You feel very restless. ?You feel suddenly nauseous or throw up. ?You suddenly have diarrhea. ?Summary ?A thyroid nodule is an isolated growth of thyroid cells that forms a lump in your thyroid gland. ?Thyroid nodules are common. Most are not cancerous (benign). You may have one nodule or several nodules. ?Treatment depends on the cause and size of your nodule or nodules. If the nodule is benign, treatment may not be necessary. ?Your health care provider may monitor the nodule to see if it goes away without treatment. If the nodule continues to grow, is cancerous, or does not go away, treatment may be needed. ?This information is not intended to replace advice given to you by your health care provider. Make sure you discuss any questions you have with your health care provider. ?Document Revised: 03/25/2018 Document Reviewed: 03/28/2018 ?Elsevier Patient Education ? [place]. ? ?

## 2021-11-04 ENCOUNTER — Encounter: Payer: Self-pay | Admitting: Orthopedic Surgery

## 2021-11-04 ENCOUNTER — Ambulatory Visit: Payer: Medicaid Other

## 2021-11-04 ENCOUNTER — Ambulatory Visit (INDEPENDENT_AMBULATORY_CARE_PROVIDER_SITE_OTHER): Payer: Self-pay | Admitting: Orthopedic Surgery

## 2021-11-04 DIAGNOSIS — M542 Cervicalgia: Secondary | ICD-10-CM

## 2021-11-04 DIAGNOSIS — M25511 Pain in right shoulder: Secondary | ICD-10-CM

## 2021-11-04 DIAGNOSIS — G8929 Other chronic pain: Secondary | ICD-10-CM

## 2021-11-04 LAB — T4, FREE: Free T4: 0.91 ng/dL (ref 0.82–1.77)

## 2021-11-04 LAB — THYROGLOBULIN ANTIBODY: Thyroglobulin Antibody: <1 IU/mL (ref 0.0–0.9)

## 2021-11-04 LAB — THYROID PEROXIDASE ANTIBODY: Thyroperoxidase Ab SerPl-aCnc: 14 IU/mL (ref 0–34)

## 2021-11-04 LAB — T3, FREE: T3, Free: 2.7 pg/mL (ref 2.0–4.4)

## 2021-11-04 LAB — TSH: TSH: 0.796 u[IU]/mL (ref 0.450–4.500)

## 2021-11-04 MED ORDER — TRAMADOL HCL 50 MG PO TABS: 50.0000 mg | ORAL_TABLET | Freq: Two times a day (BID) | ORAL | 0 refills | Status: DC | PRN

## 2021-11-04 NOTE — Patient Instructions (Signed)
Referral to Dr. Ernestina Patches for a right shoulder high volume injection ? ?Referral to OT for a right elbow, cubital tunnel syndrome splint  ? ?After the injection, please start to move the shoulder.  Start the exercises below.  ? ? ? ?Rotator Cuff Tear/Tendinitis Rehab  ? ?Ask your health care provider which exercises are safe for you. Do exercises exactly as told by your health care provider and adjust them as directed. It is normal to feel mild stretching, pulling, tightness, or discomfort as you do these exercises. Stop right away if you feel sudden pain or your pain gets worse. Do not begin these exercises until told by your health care provider. ?Stretching and range-of-motion exercises ? ?These exercises warm up your muscles and joints and improve the movement and flexibility of your shoulder. These exercises also help to relieve pain. ? ?Shoulder pendulum ?In this exercise, you let the injured arm dangle toward the floor and then swing it like a clock pendulum. ?Stand near a table or counter that you can hold onto for balance. ?Bend forward at the waist and let your left / right arm hang straight down. Use your other arm to support you and help you stay balanced. ?Relax your left / right arm and shoulder muscles, and move your hips and your trunk so your left / right arm swings freely. Your arm should swing because of the motion of your body, not because you are using your arm or shoulder muscles. ?Keep moving your hips and trunk so your arm swings in the following directions, as told by your health care provider: ?Side to side. ?Forward and backward. ?In clockwise and counterclockwise circles. ?Slowly return to the starting position. ?Repeat 10 times, or for 10 seconds per direction. Complete this exercise 2-3 times a day. ?  ?   ?Shoulder flexion, seated ?This exercise is sometimes called table slides. In this exercise, you raise your arm in front of your body until you feel a stretch in your injured  shoulder. ?Sit in a stable chair so your left / right forearm can rest on a flat surface. Your elbow should rest at a height that keeps your upper arm next to your body. ?Keeping your left / right shoulder relaxed, lean forward at the waist and let your hand slide forward (flexion). Stop when you feel a stretch in your shoulder, or when you reach the angle that is recommended by your health care provider. ?Hold for 5 seconds. ?Slowly return to the starting position. ?Repeat 10 times. Complete this exercise 1-2  times a day. ?      ?Shoulder flexion, standing ?In this exercise, you raise your arm in front of your body (flexion) until you feel a stretch in your injured shoulder. ?Stand and hold a broomstick, a cane, or a similar object. Place your hands a little more than shoulder-width apart on the object. Your left / right hand should be palm-up, and your other hand should be palm-down. ?Keep your elbow straight and your shoulder muscles relaxed. Push the stick up with your healthy arm to raise your left / right arm in front of your body, and then over your head until you feel a stretch in your shoulder. ?Avoid shrugging your shoulder while you raise your arm. Keep your shoulder blade tucked down toward the middle of your back. ?Keep your left / right shoulder muscles relaxed. ?Hold for 10 seconds. ?Slowly return to the starting position. ?Repeat 10 times. Complete this exercise 1-2 times a day. ?  ?   ?  Shoulder abduction, active-assisted ?You will need a stick, broom handle, or similar object to help you (assist) in doing this exercise. ?Lie on your back. This is the supine position. Hold a broomstick, a cane, or a similar object. ?Place your hands a little more than shoulder-width apart on the object. Your left / right hand should be palm-up, and your other hand should be palm-down. ?Keeping your shoulder relaxed, push the stick to raise your left / right arm out to your side (abduction) and then over your head.  Use your other hand to help move the stick. Stop when you feel a stretch in your shoulder, or when you reach the angle that is recommended by your health care provider. ?Avoid shrugging your shoulder while you raise your arm. Keep your shoulder blade tucked down toward the middle of your back. ?Hold for 10 seconds. ?Slowly return to the starting position. ?Repeat 10 times. Complete this exercise 1-2 times a day. ?  ?   ?Shoulder flexion, active-assisted ?Lie on your back. You may bend your knees for comfort. ?Hold a broomstick, a cane, or a similar object so that your hands are about shoulder-width apart. Your palms should face toward your feet. ?Raise your left / right arm over your head, then behind your head toward the floor (flexion). Use your other hand to help you do this (active-assisted). Stop when you feel a gentle stretch in your shoulder, or when you reach the angle that is recommended by your health care provider. ?Hold for 10 seconds. ?Use the stick and your other arm to help you return your left / right arm to the starting position. ?Repeat 10 times. Complete this exercise 1-2 times a day. ?  ?   ?External rotation ?Sit in a stable chair without armrests, or stand up. ?Tuck a soft object, such as a folded towel or a small ball, under your left / right upper arm. ?Hold a broomstick, a cane, or a similar object with your palms face-down, toward the floor. Bend your elbows to a 90-degree angle (right angle), and keep your hands about shoulder-width apart. ?Straighten your healthy arm and push the stick across your body, toward your left / right side. Keep your left / right arm bent. This will rotate your left / right forearm away from your body (external rotation). ?Hold for 10 seconds. ?Slowly return to the starting position. ?Repeat 10 times. Complete this exercise 1-2 times a day.  ?   ? ? ? ?Strengthening exercises ?These exercises build strength and endurance in your shoulder. Endurance is the  ability to use your muscles for a long time, even after they get tired. Do not start doing these exercises until your health care provider approves. ?Shoulder flexion, isometric ?Stand or sit in a doorway, facing the door frame. ?Keep your left / right arm straight and make a gentle fist with your hand. Place your fist against the door frame. Only your fist should be touching the frame. Keep your upper arm at your side. ?Gently press your fist against the door frame, as if you are trying to raise your arm above your head (isometric shoulder flexion). ?Avoid shrugging your shoulder while you press your hand into the door frame. Keep your shoulder blade tucked down toward the middle of your back. ?Hold for 10 seconds. ?Slowly release the tension, and relax your muscles completely before you repeat the exercise. ?Repeat 10 times. Complete this exercise 3 times per week. ?     ?Shoulder abduction,  isometric ?Stand or sit in a doorway. Your left / right arm should be closest to the door frame. ?Keep your left / right arm straight, and place the back of your hand against the door frame. Only your hand should be touching the frame. Keep the rest of your arm close to your side. ?Gently press the back of your hand against the door frame, as if you are trying to raise your arm out to the side (isometric shoulder abduction). ?Avoid shrugging your shoulder while you press your hand into the door frame. Keep your shoulder blade tucked down toward the middle of your back. ?Hold for 10 seconds. ?Slowly release the tension, and relax your muscles completely before you repeat the exercise. ?Repeat 10 times. Complete this exercise 3 times per week. ?  ?   ?Internal rotation, isometric ?This is an exercise in which you press your palm against a door frame without moving your shoulder joint (isometric). ?Stand or sit in a doorway, facing the door frame. ?Bend your left / right elbow, and place the palm of your hand against the door  frame. Only your palm should be touching the frame. Keep your upper arm at your side. ?Gently press your hand against the door frame, as if you are trying to push your arm toward your abdomen (internal rotation). [employer]

## 2021-11-05 ENCOUNTER — Other Ambulatory Visit: Payer: Self-pay

## 2021-11-05 ENCOUNTER — Encounter: Payer: Self-pay | Admitting: Orthopedic Surgery

## 2021-11-05 ENCOUNTER — Ambulatory Visit: Payer: Medicaid Other | Admitting: Licensed Clinical Social Worker

## 2021-11-05 DIAGNOSIS — F32A Depression, unspecified: Secondary | ICD-10-CM

## 2021-11-05 DIAGNOSIS — F419 Anxiety disorder, unspecified: Secondary | ICD-10-CM
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
## 2021-11-05 NOTE — Progress Notes (Signed)
[facility] engaged patient in follow-up session. Patient presented with elevated mood and affect. 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 [facility] affirmed patient's resource of writing poetry. ?

## 2021-11-06 ENCOUNTER — Ambulatory Visit: Payer: Self-pay | Admitting: *Deleted

## 2021-11-06 ENCOUNTER — Other Ambulatory Visit: Payer: Self-pay

## 2021-11-06 VITALS — BP 144/88 | Wt 200.5 lb

## 2021-11-06 NOTE — Patient Instructions (Signed)
Explained breast self awareness with Leafy Kindle. Patient did not need a Pap smear today due to last Pap smear was in June 2020 per patient. Let her know [facility] will cover Pap smears every 3 years unless has a history of abnormal Pap smears and that her next Pap smear is due in June 2023. Informed patient that our office will call her to schedule Pap smear appointment. Referred patient to the [facility] for a diagnostic mammogram. Appointment scheduled Thursday, November 11, 2021 at 1520. Patient aware of appointment and will be there. Aalyiah Camberos verbalized understanding. ? ?Dariya Gainer, Arvil Chaco, RN ?10:39 AM ? ? ? ? ?

## 2021-11-06 NOTE — Progress Notes (Signed)
Ms. Mason Burleigh is a 51 y.o. female who presents to [facility] clinic today with complaint of bilateral breast pain that is greater within the left breast that comes and goes x 6 months. Patient rates the pain at a 10 out of 10. Patient complained that her left breast has increased in size over the past 6 months and that she notices a shadow/bruised appeared within her left outer breast..  ?  ?Pap Smear: Pap smear not completed today. Last Pap smear was in June 2020 at [facility] clinic in  [place] and was normal per patient. Per patient has no history of an abnormal Pap smear. Last Pap smear result is not available in Epic. ?  ?Physical exam: ?Breasts ?Breasts symmetrical. No skin abnormalities bilateral breasts. No nipple retraction bilateral breasts. No nipple discharge bilateral breasts. No lymphadenopathy. No lumps palpated bilateral breasts. Complaints of left diffuse and axillary pain on exam. No abnormalities observed on exam    ? ?Pelvic/Bimanual ?Pap is not indicated today per [facility] guidelines. ?  ?Smoking History: ?Patient is a former smoker that quit 08/25/1995. ?  ?Patient Navigation: ?Patient education provided. Access to services provided for patient through [facility] program.  ? ?Colorectal Cancer Screening: ?Per patient has never had colonoscopy completed. Patient completed a FIT Test given by her PCP that was negative. No complaints today.  ?  ?Breast and Cervical Cancer Risk Assessment: ?Patient does not have family history of breast cancer, known genetic mutations, or radiation treatment to the chest before age 59. Patient does not have history of cervical dysplasia, immunocompromised, or DES exposure in-utero. ? ?Risk Assessment   ? ? Risk Scores   ? ?   11/06/2021  ? Last edited by: Demetrius Revel, LPN  ? 5-year risk: 0.8 %  ? Lifetime risk: 6.9 %  ? ?  ?  ? ?  ? ? ?A: ?[facility] exam without pap smear ?Complaint of bilateral breast pain and left breast skin changes. ? ?P: ?Referred  patient to the [facility] for a diagnostic mammogram. Appointment scheduled Thursday, November 11, 2021 at 1520. ? ?Loletta Parish, RN ?11/06/2021 10:39 AM   ?

## 2021-11-11 ENCOUNTER — Ambulatory Visit (HOSPITAL_COMMUNITY)
Admission: RE | Admit: 2021-11-11 | Discharge: 2021-11-11 | Disposition: A | Discharge status: Home / Self Care | Payer: Self-pay | Source: Ambulatory Visit | Attending: Obstetrics and Gynecology | Admitting: Obstetrics and Gynecology

## 2021-11-11 ENCOUNTER — Encounter (HOSPITAL_COMMUNITY): Payer: Self-pay

## 2021-11-11 ENCOUNTER — Other Ambulatory Visit: Payer: Self-pay

## 2021-11-11 ENCOUNTER — Ambulatory Visit (HOSPITAL_COMMUNITY)
Admission: RE | Admit: 2021-11-11 | Discharge: 2021-11-11 | Disposition: A | Discharge status: Home / Self Care | Payer: Medicaid Other | Source: Ambulatory Visit | Attending: Obstetrics and Gynecology | Admitting: Obstetrics and Gynecology

## 2021-11-11 DIAGNOSIS — N6320 Unspecified lump in the left breast, unspecified quadrant: Secondary | ICD-10-CM

## 2021-11-12 ENCOUNTER — Ambulatory Visit (INDEPENDENT_AMBULATORY_CARE_PROVIDER_SITE_OTHER): Payer: Self-pay | Admitting: Internal Medicine

## 2021-11-12 ENCOUNTER — Encounter: Payer: Self-pay | Admitting: Internal Medicine

## 2021-11-12 VITALS — BP 112/84 | HR 89 | Ht 66.0 in | Wt 203.2 lb

## 2021-11-12 DIAGNOSIS — R0602 Shortness of breath: Secondary | ICD-10-CM

## 2021-11-12 NOTE — Patient Instructions (Signed)
Medication Instructions:  ?Your physician recommends that you continue on your current medications as directed. Please refer to the Current Medication list given to you today. ? ?*If you need a refill on your cardiac medications before your next appointment, please call your pharmacy* ? ? ?Lab Work: ?NONE  ? ?If you have labs (blood work) drawn today and your tests are completely normal, you will receive your results only by: ?MyChart Message (if you have MyChart) OR ?A paper copy in the mail ?If you have any lab test that is abnormal or we need to change your treatment, we will call you to review the results. ? ? ?Testing/Procedures: ?Your physician has requested that you have an echocardiogram. Echocardiography is a painless test that uses sound waves to create images of your heart. It provides your doctor with information about the size and shape of your heart and how well your heart?s chambers and valves are working. This procedure takes approximately one hour. There are no restrictions for this procedure. ? ? ? ?Follow-Up: ?At [facility], you and your health needs are our priority.  As part of our continuing mission to provide you with exceptional heart care, we have created designated Provider Care Teams.  These Care Teams include your primary Cardiologist (physician) and Advanced Practice Providers (APPs -  Physician Assistants and Nurse Practitioners) who all work together to provide you with the care you need, when you need it. ? ?We recommend signing up for the patient portal called "MyChart".  Sign up information is provided on this After Visit Summary.  MyChart is used to connect with patients for Virtual Visits (Telemedicine).  Patients are able to view lab/test results, encounter notes, upcoming appointments, etc.  Non-urgent messages can be sent to your provider as well.   ?To learn more about what you can do with MyChart, go to [email or web address].   ? ?Your next appointment:   ? To Be  Determined  ? ?The format for your next appointment:   ?In Person ? ?Provider:   ?Dorris Carnes, MD  ? ? ?Other Instructions ?Thank you for choosing [facility]! ?  ?  ?

## 2021-11-12 NOTE — Progress Notes (Signed)
? ?Cardiology Office Note ? ? ?Date:  11/12/2021  ? ?ID:  Briana Wyatt, DOB 1970-10-07, MRN [id number] ? ?PCP:  Soyla Dryer, PA-C  ?Cardiologist:   Dorris Carnes, MD  ? ?Pt referred for eval of CP    ?  ?History of Present Illness: ?Briana Wyatt is a 51 y.o. female with a history of depression and chest pain     ?Pt was seen in [facility] ED on 09/07/21   Complained of L sided CP  Also complained of body aches and HA at the time    ?The pt reported to PCP that there was "something wrong with my heart"   ? ?Hx of cocaine use in the past    Pt with 1 kidney    Hx of migraines  ? ?The pt says for years she has had sharp left sided CP  Episodes last about 1 min   SHe has had spells intermittently for years   Spells are not assicateid with acitivty  Could be sitting     ?Pt has SOB   Chronically   Quit 20 years ago     ?Smoked crack in past   Quit 1 year  ? ?Pt has chronic body pains     ? ? ?Current Meds  ?Medication Sig  ? acetaminophen (TYLENOL) 650 MG CR tablet Take 650 mg by mouth as needed.  ? albuterol (VENTOLIN HFA) 108 (90 Base) MCG/ACT inhaler Inhale 2 puffs into the lungs every 6 (six) hours as needed for wheezing or shortness of breath.  ? baclofen (LIORESAL) 10 MG tablet Take 10 mg by mouth 2 (two) times daily.  ? cetirizine (ZYRTEC) 10 MG tablet Take 10 mg by mouth daily.  ? Cholecalciferol (VITAMIN D-3 PO) Take 1 tablet by mouth daily.  ? DULoxetine (CYMBALTA) 60 MG capsule Take 60 mg by mouth daily.  ? gabapentin (NEURONTIN) 300 MG capsule Take 1 capsule (300 mg total) by mouth 3 (three) times daily.  ? losartan (COZAAR) 50 MG tablet Take 1 tablet (50 mg total) by mouth daily.  ? Multiple Vitamins-Minerals (CENTRUM SILVER PO) Take 1 tablet by mouth daily.  ? omeprazole (PRILOSEC) 40 MG capsule Take 1 capsule (40 mg total) by mouth daily.  ? traMADol (ULTRAM) 50 MG tablet Take 1 tablet (50 mg total) by mouth every 12 (twelve) hours as needed.  ? ? ? ?Allergies:   Codeine, Methocarbamol, and Nsaids   ? ?Past Medical History:  ?Diagnosis Date  ? Acid reflux   ? 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 Hypertension   ? Restless leg syndrome   ? Sciatica of right side   ? ? ?Past Surgical History:  ?Procedure Laterality Date  ? CARPAL TUNNEL RELEASE Bilateral   ? ECTOPIC PREGNANCY SURGERY    ? SHOULDER SURGERY Right   ? TUBAL LIGATION    ? ? ? ?Social History:  The patient  reports that she quit smoking about 26 years ago. Her smoking use included cigarettes. She has never used smokeless tobacco. She reports that she does not currently use drugs after having used the following drugs: Marijuana and Cocaine. She reports that she does not drink alcohol.  ? ?Family History:  The patient's family history includes Hypertension in her father; Stroke in her father.  ? ? ?ROS:  Please see the history of present illness. All other systems are reviewed and  Negative to the above problem except as noted.  ? ? ?  PHYSICAL EXAM: ?VS:  BP 112/84   Pulse 89   Ht '5\' 6"'$  (1.676 m)   Wt 203 lb 3.2 oz (92.2 kg)   SpO2 96%   BMI 32.80 kg/m?   ?GEN: Obese 51 yo  in no acute distress  ?HEENT: normal  ?Neck: no JVD, no carotid bruits, ?Cardiac: RRR; no murmurs  No LE edema  ?Respiratory:  clear to auscultation bilaterally, ?GI: soft, nontender, nondistended, + BS  No hepatomegaly  ?MS: no deformity Moving all extremities   ?Skin: warm and dry, no rash ?Neuro:  Strength and sensation are intact ?Psych: euthymic mood, full affect ? ? ?EKG:  EKG is not  ordered today  in ED NSR    ? ? ?Lipid Panel ?   ?Component Value Date/Time  ? CHOL 153 10/08/2021 1232  ? TRIG 107 10/08/2021 1232  ? HDL 42 10/08/2021 1232  ? CHOLHDL 3.6 10/08/2021 1232  ? VLDL 21 10/08/2021 1232  ? Jasper 90 10/08/2021 1232  ? ?  ? ?Wt Readings from Last 3 Encounters:  ?11/12/21 203 lb 3.2 oz (92.2 kg)  ?11/06/21 200 lb 8 oz (90.9 kg)  ?11/03/21 200 lb 6.4 oz (90.9 kg)  ?  ? ? ?ASSESSMENT AND PLAN: ? ?1  Chest pain  Atypical   Probable musculoskeletal   Not cardiac ? ?2  Dyspnea  Chronic   Will get echo  to evaluate  LV systolic /diastolic function    ? ?3   HCM    Hurts to walk   Limts activity   ?Discussed diet   Low carb  Low sweets  Cut out sugary drinks ? ? ?Current medicines are reviewed at length with the patient today.  The patient does not have concerns regarding medicines. ? ?Signed, ?Dorris Carnes, MD  ?11/12/2021 2:05 PM    ?[facility] ?[address], [place], [place]  [address] ?Phone: [phone]; Fax: [phone]  ? ? ?

## 2021-11-13 ENCOUNTER — Encounter (HOSPITAL_COMMUNITY): Payer: Self-pay

## 2021-11-13 ENCOUNTER — Other Ambulatory Visit: Payer: Self-pay

## 2021-11-13 ENCOUNTER — Ambulatory Visit (HOSPITAL_COMMUNITY)
Admission: RE | Admit: 2021-11-13 | Discharge: 2021-11-13 | Disposition: A | Discharge status: Home / Self Care | Payer: Self-pay | Source: Ambulatory Visit | Attending: Nurse Practitioner | Admitting: Nurse Practitioner

## 2021-11-13 DIAGNOSIS — R7989 Other specified abnormal findings of blood chemistry: Secondary | ICD-10-CM | POA: Insufficient documentation

## 2021-11-13 DIAGNOSIS — E041 Nontoxic single thyroid nodule: Secondary | ICD-10-CM | POA: Insufficient documentation

## 2021-11-13 MED ORDER — LIDOCAINE HCL (PF) 2 % IJ SOLN
INTRAMUSCULAR | Status: AC
  Filled 2021-11-13: qty 10

## 2021-11-13 MED ORDER — LIDOCAINE HCL (PF) 2 % IJ SOLN
10.0000 mL | Freq: Once | INTRAMUSCULAR | Status: AC
Start: 2021-11-13 — End: 2021-11-13
  Administered 2021-11-13: 10 mL

## 2021-11-13 NOTE — Progress Notes (Signed)
PT tolerated thyroid biopsy procedure well today. Labs and afirma obtained and sent for pathology. PT ambulatory at discharge with no acute distress noted and verbalized understanding of discharge instructions. 

## 2021-11-14 ENCOUNTER — Ambulatory Visit (HOSPITAL_COMMUNITY): Payer: Medicaid Other | Attending: Orthopedic Surgery

## 2021-11-17 ENCOUNTER — Encounter: Payer: Self-pay | Admitting: Physician Assistant

## 2021-11-17 ENCOUNTER — Other Ambulatory Visit: Payer: Self-pay

## 2021-11-17 ENCOUNTER — Ambulatory Visit: Payer: Medicaid Other | Admitting: Physician Assistant

## 2021-11-17 VITALS — BP 128/92 | HR 82 | Temp 97.2°F | Wt 197.0 lb

## 2021-11-17 DIAGNOSIS — R9082 White matter disease, unspecified: Secondary | ICD-10-CM

## 2021-11-17 DIAGNOSIS — F32A Depression, unspecified: Secondary | ICD-10-CM

## 2021-11-17 DIAGNOSIS — G8929 Other chronic pain: Secondary | ICD-10-CM

## 2021-11-17 DIAGNOSIS — F419 Anxiety disorder, unspecified: Secondary | ICD-10-CM

## 2021-11-17 DIAGNOSIS — E0590 Thyrotoxicosis, unspecified without thyrotoxic crisis or storm: Secondary | ICD-10-CM

## 2021-11-17 DIAGNOSIS — M5412 Radiculopathy, cervical region: Secondary | ICD-10-CM

## 2021-11-17 DIAGNOSIS — R8290 Unspecified abnormal findings in urine: Secondary | ICD-10-CM

## 2021-11-17 DIAGNOSIS — E042 Nontoxic multinodular goiter: Secondary | ICD-10-CM

## 2021-11-17 LAB — POCT URINALYSIS DIPSTICK
Bilirubin, UA: NEGATIVE
Blood, UA: NEGATIVE
Glucose, UA: NEGATIVE
Ketones, UA: NEGATIVE
Nitrite, UA: POSITIVE
Protein, UA: POSITIVE — AB
Spec Grav, UA: 1.01 (ref 1.010–1.025)
Urobilinogen, UA: 1 E.U./dL
pH, UA: 6 (ref 5.0–8.0)

## 2021-11-17 MED ORDER — SULFAMETHOXAZOLE-TRIMETHOPRIM 800-160 MG PO TABS: 1.0000 | ORAL_TABLET | Freq: Two times a day (BID) | ORAL | 0 refills | Status: DC

## 2021-11-17 NOTE — Progress Notes (Signed)
? ?BP (!) 128/92   Pulse 82   Temp (!) 97.2 ?F (36.2 ?C)   Wt 197 lb (89.4 kg)   SpO2 98%   BMI 31.80 kg/m?   ? ?Subjective:  ? ? Patient ID: Briana Wyatt, female    DOB: 04-22-1971, 51 y.o.   MRN: [id number] ? ?HPI: ?Briana Wyatt is a 51 y.o. female presenting on 11/17/2021 for No chief complaint on file. ? ? ?HPI ? ? ?Pt is 63yoF who presents for follow up.  She has had appointments with many specialists: ? ?-Neurology- she has appointment tomorrow at [facility] ?-Cardiology- seen 3/22- CP felt not attributable to heart.  Echo ordered ?-Orthopedics- PT and OT were ordered ?-Endocrinology- seen 3/13- FNA thyroid ordered ?[facility]- counseling ongoing ?-Breast- she has appointment for diagnostic mammogram ? ? ? ?Pt says Her urine smells funny x 1 week and she has kidney pain ? ?She says the gabapenin was helping but she ran out.  ? ? ? ? ?Relevant past medical, surgical, family and social history reviewed and updated as indicated. Interim medical history since our last visit reviewed ? ? ?Allergies and medications reviewed and updated. ? ? ?Current Outpatient Medications:  ?  baclofen (LIORESAL) 10 MG tablet, Take 10 mg by mouth 2 (two) times daily., Disp: , Rfl:  ?  DULoxetine (CYMBALTA) 60 MG capsule, Take 60 mg by mouth daily., Disp: , Rfl:  ?  acetaminophen (TYLENOL) 650 MG CR tablet, Take 650 mg by mouth as needed., Disp: , Rfl:  ?  albuterol (VENTOLIN HFA) 108 (90 Base) MCG/ACT inhaler, Inhale 2 puffs into the lungs every 6 (six) hours as needed for wheezing or shortness of breath., Disp: 3 each, Rfl: 0 ?  cetirizine (ZYRTEC) 10 MG tablet, Take 10 mg by mouth daily., Disp: , Rfl:  ?  Cholecalciferol (VITAMIN D-3 PO), Take 1 tablet by mouth daily., Disp: , Rfl:  ?  gabapentin (NEURONTIN) 300 MG capsule, Take 1 capsule (300 mg total) by mouth 3 (three) times daily. (Patient not taking: Reported on 11/17/2021), Disp: 60 capsule, Rfl: 0 ?  losartan (COZAAR) 50 MG tablet, Take 1 tablet (50 mg total) by mouth  daily., Disp: 90 tablet, Rfl: 0 ?  Multiple Vitamins-Minerals (CENTRUM SILVER PO), Take 1 tablet by mouth daily., Disp: , Rfl:  ?  omeprazole (PRILOSEC) 40 MG capsule, Take 1 capsule (40 mg total) by mouth daily., Disp: 90 capsule, Rfl: 1 ?  traMADol (ULTRAM) 50 MG tablet, Take 1 tablet (50 mg total) by mouth every 12 (twelve) hours as needed., Disp: 30 tablet, Rfl: 0 ? ? ? ? ? ?Review of Systems ? ?Per HPI unless specifically indicated above ? ?   ?Objective:  ?  ?BP (!) 128/92   Pulse 82   Temp (!) 97.2 ?F (36.2 ?C)   Wt 197 lb (89.4 kg)   SpO2 98%   BMI 31.80 kg/m?   ?Wt Readings from Last 3 Encounters:  ?11/17/21 197 lb (89.4 kg)  ?11/12/21 203 lb 3.2 oz (92.2 kg)  ?11/06/21 200 lb 8 oz (90.9 kg)  ?  ?Physical Exam ?Vitals reviewed.  ?Constitutional:   ?   General: She is not in acute distress. ?   Appearance: She is well-developed. She is not toxic-appearing.  ?HENT:  ?   Head: Normocephalic and atraumatic.  ?Cardiovascular:  ?   Rate and Rhythm: Normal rate and regular rhythm.  ?Pulmonary:  ?   Effort: Pulmonary effort is normal.  ?   Breath sounds: Normal  breath sounds.  ?Abdominal:  ?   General: Bowel sounds are normal.  ?   Palpations: Abdomen is soft. There is no mass.  ?   Tenderness: There is no abdominal tenderness. There is no right CVA tenderness or left CVA tenderness.  ?Musculoskeletal:  ?   Cervical back: Neck supple.  ?Lymphadenopathy:  ?   Cervical: No cervical adenopathy.  ?Skin: ?   General: Skin is warm and dry.  ?Neurological:  ?   Mental Status: She is alert and oriented to person, place, and time.  ?Psychiatric:     ?   Attention and Perception: Attention normal.     ?   Behavior: Behavior is cooperative.  ? ? ? ? ? ?   ?Assessment & Plan:  ? ? ?Encounter Diagnoses  ?Name Primary?  ? Abnormal urine odor Yes  ? Depression, unspecified depression type   ? Anxiety   ? Hyperthyroidism   ? Multiple thyroid nodules   ? White matter abnormality on MRI of brain   ? Chronic right shoulder pain    ? Cervical radiculopathy   ? Other chronic pain   ? ? ? ? ? ?-Rx septra for uti.  Pt counseled to drink plenty of water ?-pt to keep specialist appointments as scheduled ?-will refill gabapentin.   ?-pt to follow up here 2 months.  sHe is to contact office sooner prn ?

## 2021-11-18 LAB — CYTOLOGY - NON PAP

## 2021-11-18 MED ORDER — GABAPENTIN 300 MG PO CAPS: 300.0000 mg | ORAL_CAPSULE | Freq: Two times a day (BID) | ORAL | 0 refills | Status: DC

## 2021-11-19 ENCOUNTER — Ambulatory Visit: Payer: Medicaid Other | Admitting: Licensed Clinical Social Worker

## 2021-11-19 DIAGNOSIS — F419 Anxiety disorder, unspecified: Secondary | ICD-10-CM

## 2021-11-19 DIAGNOSIS — F32A Depression, unspecified: Secondary | ICD-10-CM

## 2021-11-19 NOTE — Progress Notes (Signed)
[facility] engaged patient in follow-up session. Patient presented with elevated affect and reported anxious mood. [facility] provided mirroring and empathetic presence as patient shared about current stressors and symptoms. Next apt was scheduled for 12/03/21 at 1 pm.  ?

## 2021-11-26 ENCOUNTER — Other Ambulatory Visit: Payer: Self-pay | Admitting: Radiology

## 2021-11-26 MED ORDER — TRAMADOL HCL 50 MG PO TABS: 50.0000 mg | ORAL_TABLET | Freq: Two times a day (BID) | ORAL | 0 refills | Status: DC | PRN

## 2021-11-26 NOTE — Telephone Encounter (Signed)
I called patient, working on getting her scheduled for her HV Hubbell Jt Injection, and she asked for refill of tramadol.   ?

## 2021-11-27 ENCOUNTER — Telehealth: Payer: Self-pay

## 2021-11-27 MED ORDER — TRAMADOL HCL 50 MG PO TABS: 50.0000 mg | ORAL_TABLET | Freq: Two times a day (BID) | ORAL | 0 refills | Status: DC | PRN

## 2021-11-27 NOTE — Telephone Encounter (Signed)
Will you resend this request for Dr. Amedeo Kinsman' patient since he is out today? ?It was sent to the wrong pharmacy yesterday, I have corrected the pharmacy in the chart. ? ? ?Tramadol HCI  50 mg Oral Every 12 hours PRN ? ?PATIENT USES [facility] ? ? ?

## 2021-11-28 ENCOUNTER — Ambulatory Visit (HOSPITAL_COMMUNITY)
Admission: RE | Admit: 2021-11-28 | Discharge: 2021-11-28 | Disposition: A | Discharge status: Home / Self Care | Payer: Self-pay | Source: Ambulatory Visit | Attending: Internal Medicine | Admitting: Internal Medicine

## 2021-11-28 DIAGNOSIS — R0602 Shortness of breath: Secondary | ICD-10-CM | POA: Insufficient documentation

## 2021-11-28 LAB — ECHOCARDIOGRAM COMPLETE
AR max vel: 2.1 cm2
AV Area VTI: 2.57 cm2
AV Area mean vel: 2.03 cm2
AV Mean grad: 3 mmHg
AV Peak grad: 5.6 mmHg
Ao pk vel: 1.18 m/s
Area-P 1/2: 3.31 cm2
Calc EF: 55.2 %
MV VTI: 2.96 cm2
S' Lateral: 3.7 cm
Single Plane A2C EF: 56.8 %
Single Plane A4C EF: 51.7 %

## 2021-11-28 NOTE — Progress Notes (Signed)
*  PRELIMINARY RESULTS* ?Echocardiogram ?2D Echocardiogram has been performed. ? ?Briana Wyatt ?11/28/2021, 4:04 PM ?

## 2021-12-01 ENCOUNTER — Telehealth: Payer: Self-pay

## 2021-12-01 DIAGNOSIS — R931 Abnormal findings on diagnostic imaging of heart and coronary circulation: Secondary | ICD-10-CM

## 2021-12-01 NOTE — Telephone Encounter (Signed)
-----   Message from Fay Records, MD sent at 11/30/2021  9:50 PM EDT ----- ?WOuld recomm limited echo with [facility] to confirm regional wall motion ?

## 2021-12-01 NOTE — Telephone Encounter (Signed)
Order placed at this time for limited ECHO with [facility] per Dr Harrington Challenger. Pt understands someone will be calling her to schedule. ?

## 2021-12-03 ENCOUNTER — Ambulatory Visit: Payer: Medicaid Other | Admitting: Licensed Clinical Social Worker

## 2021-12-03 DIAGNOSIS — F419 Anxiety disorder, unspecified: Secondary | ICD-10-CM

## 2021-12-03 DIAGNOSIS — F32A Depression, unspecified: Secondary | ICD-10-CM

## 2021-12-03 NOTE — Progress Notes (Signed)
[facility] engaged patient in follow-up session. Patient reported being in physical pain with depressed mood and affect. [facility] provided reflective listening and validation as patient shared about depression and frustration related to interpersonal conflict within family system. Next apt scheduled for 12/23/21. ?

## 2021-12-04 ENCOUNTER — Ambulatory Visit: Payer: Medicaid Other | Admitting: Nurse Practitioner

## 2021-12-04 ENCOUNTER — Encounter: Payer: Self-pay | Admitting: Physician Assistant

## 2021-12-10 ENCOUNTER — Ambulatory Visit: Payer: Medicaid Other | Admitting: Physician Assistant

## 2021-12-10 ENCOUNTER — Encounter: Payer: Self-pay | Admitting: Physician Assistant

## 2021-12-10 ENCOUNTER — Ambulatory Visit (HOSPITAL_COMMUNITY)
Admission: RE | Admit: 2021-12-10 | Discharge: 2021-12-10 | Disposition: A | Discharge status: Home / Self Care | Payer: Medicaid Other | Source: Ambulatory Visit | Attending: Physician Assistant | Admitting: Physician Assistant

## 2021-12-10 VITALS — BP 140/93 | HR 83 | Temp 97.2°F | Wt 200.0 lb

## 2021-12-10 DIAGNOSIS — M25512 Pain in left shoulder: Secondary | ICD-10-CM

## 2021-12-10 NOTE — Progress Notes (Signed)
? ?BP (!) 140/93   Pulse 83   Temp (!) 97.2 ?F (36.2 ?C)   Wt 200 lb (90.7 kg)   SpO2 96%   BMI 32.28 kg/m?   ? ?Subjective:  ? ? Patient ID: Briana Wyatt, female    DOB: [birth year], 51 y.o.   MRN: [id number] ? ?HPI: ?Briana Wyatt is a 51 y.o. female presenting on 12/10/2021 for No chief complaint on file. ? ? ?HPI ? ? ?Pt is a 37yoF with a Multitude of issues: ? ?-She is seen by neurology/ Dr Viann Shove at [facility].  She recentlty ordered PT and migraine injections.  Diagnosis is a white matter abnormality possible MS ?-She is Seeing [facility] for counseling ?-She is going to be going to [facility] for MH/psychiatrist for [facility] medication ?-She sees dr Cairns/orthopedics for her right shoulder and has appt for injection of it by dr Ernestina Patches ?-She says she has 2 appointments with PT- 1 for her shoulder and the other for her neuorlogy referral. ? ? ?She is in today to evaluate a lump on her back and to evaluate left shoulder pain.   ?She has a swell on her back that has been there for a long time.  She says the neurologist told her to have her PCP check it.   ?She fell last week and since then she has had left shoulder pain and elbow ? ?Swelling on back for about a month.  It doesn't hurt but is uncomfortable when she sleeps. ? ?Left shoulder just started hurting but then says  for months it has been coming on.  It is just like her right shoulder but at an earlier stage and is more intense.   It hurts when she moves it..   she is left hand dominant.    She fell last week and it made the shoulder worse.  She also injured her left knee but it is improved some.   ? ? ? ? ?Relevant past medical, surgical, family and social history reviewed and updated as indicated. Interim medical history since our last visit reviewed. ?Allergies and medications reviewed and updated. ? ? ?Current Outpatient Medications:  ?  acetaminophen (TYLENOL) 650 MG CR tablet, Take 650 mg by mouth as needed., Disp: , Rfl:  ?  albuterol (VENTOLIN  HFA) 108 (90 Base) MCG/ACT inhaler, Inhale 2 puffs into the lungs every 6 (six) hours as needed for wheezing or shortness of breath., Disp: 3 each, Rfl: 0 ?  baclofen (LIORESAL) 10 MG tablet, Take 10 mg by mouth 2 (two) times daily., Disp: , Rfl:  ?  Calcium Carbonate-Vitamin D (CALTRATE 600+D PO), Take by mouth., Disp: , Rfl:  ?  cetirizine (ZYRTEC) 10 MG tablet, Take 10 mg by mouth daily., Disp: , Rfl:  ?  Cholecalciferol (VITAMIN D-3 PO), Take 1 tablet by mouth daily., Disp: , Rfl:  ?  gabapentin (NEURONTIN) 300 MG capsule, Take 1 capsule (300 mg total) by mouth 2 (two) times daily., Disp: 60 capsule, Rfl: 0 ?  losartan (COZAAR) 50 MG tablet, Take 1 tablet (50 mg total) by mouth daily., Disp: 90 tablet, Rfl: 0 ?  Multiple Vitamins-Minerals (CENTRUM SILVER PO), Take 1 tablet by mouth daily., Disp: , Rfl:  ?  omeprazole (PRILOSEC) 40 MG capsule, Take 1 capsule (40 mg total) by mouth daily., Disp: 90 capsule, Rfl: 1 ?  traMADol (ULTRAM) 50 MG tablet, Take 1 tablet (50 mg total) by mouth every 12 (twelve) hours as needed., Disp: 30 tablet, Rfl: 0 ?  DULoxetine (CYMBALTA) 60 MG capsule, Take 60 mg by mouth daily. (Patient not taking: Reported on 12/10/2021), Disp: , Rfl:  ? ? ? ?Review of Systems ? ?Per HPI unless specifically indicated above ? ?   ?Objective:  ?  ?BP (!) 140/93   Pulse 83   Temp (!) 97.2 ?F (36.2 ?C)   Wt 200 lb (90.7 kg)   SpO2 96%   BMI 32.28 kg/m?   ?Wt Readings from Last 3 Encounters:  ?12/10/21 200 lb (90.7 kg)  ?11/17/21 197 lb (89.4 kg)  ?11/12/21 203 lb 3.2 oz (92.2 kg)  ?  ?Physical Exam ?Vitals reviewed.  ?Constitutional:   ?   General: She is not in acute distress. ?   Appearance: She is well-developed. She is not toxic-appearing.  ?HENT:  ?   Head: Normocephalic and atraumatic.  ?Cardiovascular:  ?   Rate and Rhythm: Normal rate and regular rhythm.  ?   Pulses:     ?     Radial pulses are 2+ on the left side.  ?Pulmonary:  ?   Effort: Pulmonary effort is normal.  ?   Breath sounds:  Normal breath sounds.  ?Abdominal:  ?   General: Bowel sounds are normal.  ?   Palpations: Abdomen is soft. There is no mass.  ?   Tenderness: There is no abdominal tenderness.  ?Musculoskeletal:  ?   Left shoulder: Tenderness present. Decreased range of motion.  ?   Left elbow: Normal.  ?   Cervical back: Neck supple.  ?   Comments: Lipoma upper back, soft, nontender, no redness, mobile.   ?Lymphadenopathy:  ?   Cervical: No cervical adenopathy.  ?Skin: ?   General: Skin is warm and dry.  ?Neurological:  ?   Mental Status: She is alert and oriented to person, place, and time.  ?Psychiatric:     ?   Behavior: Behavior normal.  ? ? ? ? ? ?   ?Assessment & Plan:  ? ?Encounter Diagnosis  ?Name Primary?  ? Left shoulder pain, unspecified chronicity Yes  ? ? ? ?-will get xray of the left shoulder and ask orthopedics to look at it when they see her in May for the right shoulder ?-check on cafa- pt has 100% cone financial assistance through 05/01/2022.   ?-pt to follow up here in May as scheduled.  She is to contact office sooner prn ? ?

## 2021-12-16 ENCOUNTER — Other Ambulatory Visit: Payer: Self-pay

## 2021-12-16 ENCOUNTER — Encounter: Payer: Self-pay | Admitting: Physician Assistant

## 2021-12-16 ENCOUNTER — Telehealth: Payer: Self-pay | Admitting: Physician Assistant

## 2021-12-16 ENCOUNTER — Ambulatory Visit: Payer: Medicaid Other | Admitting: Physician Assistant

## 2021-12-16 ENCOUNTER — Emergency Department (HOSPITAL_COMMUNITY)
Admission: EM | Admit: 2021-12-16 | Discharge: 2021-12-16 | Disposition: A | Discharge status: Home / Self Care | Payer: Self-pay | Attending: Student | Admitting: Student

## 2021-12-16 ENCOUNTER — Encounter (HOSPITAL_COMMUNITY): Payer: Self-pay | Admitting: *Deleted

## 2021-12-16 ENCOUNTER — Emergency Department (HOSPITAL_COMMUNITY): Payer: Self-pay

## 2021-12-16 ENCOUNTER — Encounter (HOSPITAL_COMMUNITY): Payer: Self-pay

## 2021-12-16 VITALS — HR 61 | Temp 97.1°F

## 2021-12-16 DIAGNOSIS — M549 Dorsalgia, unspecified: Secondary | ICD-10-CM | POA: Insufficient documentation

## 2021-12-16 DIAGNOSIS — I10 Essential (primary) hypertension: Secondary | ICD-10-CM | POA: Insufficient documentation

## 2021-12-16 DIAGNOSIS — Z79899 Other long term (current) drug therapy: Secondary | ICD-10-CM | POA: Insufficient documentation

## 2021-12-16 DIAGNOSIS — W19XXXA Unspecified fall, initial encounter: Secondary | ICD-10-CM

## 2021-12-16 DIAGNOSIS — Z87891 Personal history of nicotine dependence: Secondary | ICD-10-CM | POA: Insufficient documentation

## 2021-12-16 DIAGNOSIS — R768 Other specified abnormal immunological findings in serum: Secondary | ICD-10-CM

## 2021-12-16 DIAGNOSIS — R519 Headache, unspecified: Secondary | ICD-10-CM

## 2021-12-16 DIAGNOSIS — M542 Cervicalgia: Secondary | ICD-10-CM | POA: Insufficient documentation

## 2021-12-16 MED ORDER — LIDOCAINE 5 % EX PTCH: 1.0000 | MEDICATED_PATCH | CUTANEOUS | 0 refills | Status: DC

## 2021-12-16 MED ORDER — DIPHENHYDRAMINE HCL 50 MG/ML IJ SOLN
25.0000 mg | Freq: Once | INTRAMUSCULAR | Status: AC
  Administered 2021-12-16: 25 mg via INTRAVENOUS
  Filled 2021-12-16: qty 1

## 2021-12-16 MED ORDER — MORPHINE SULFATE (PF) 4 MG/ML IV SOLN
4.0000 mg | Freq: Once | INTRAVENOUS | Status: AC
  Administered 2021-12-16: 4 mg via INTRAMUSCULAR
  Filled 2021-12-16: qty 1

## 2021-12-16 MED ORDER — LACTATED RINGERS IV BOLUS
1000.0000 mL | Freq: Once | INTRAVENOUS | Status: AC
  Administered 2021-12-16: 1000 mL via INTRAVENOUS

## 2021-12-16 MED ORDER — PROCHLORPERAZINE EDISYLATE 10 MG/2ML IJ SOLN
10.0000 mg | Freq: Once | INTRAMUSCULAR | Status: AC
  Administered 2021-12-16: 10 mg via INTRAVENOUS
  Filled 2021-12-16: qty 2

## 2021-12-16 MED ORDER — ONDANSETRON 4 MG PO TBDP
4.0000 mg | ORAL_TABLET | Freq: Once | ORAL | Status: AC
Start: 2021-12-16 — End: 2021-12-16
  Administered 2021-12-16: 4 mg via ORAL
  Filled 2021-12-16: qty 1

## 2021-12-16 NOTE — Progress Notes (Signed)
? ?  Pulse 61   Temp (!) 97.1 ?F (36.2 ?C)   SpO2 97%   ? ?Subjective:  ? ? Patient ID: Michon Kaczmarek, female    DOB: [birth year], 51 y.o.   MRN: [id number] ? ?HPI: ?[name] is a 51 y.o. female presenting on 12/16/2021 for Fall (Pt states she fell yesterday when she lost her balance trying to get in the [place] and hit the back of her neck. Pt states it didn't hurt last night as much as it is now. Pt reports R arm pain, bruise on buttock, lump on back of neck, pain radiating down from neck to back, R hip pain (only when lying down). Pt has taken tylenol but reports no improvement.pt reports pain when walking, breathing. Pt states "it feels like someone is ripping her spinal column out. Pt states she can't l) ? ? ?HPI ? ? ?Chief Complaint  ?Patient presents with  ? Fall  ?  Pt states she fell yesterday when she lost her balance trying to get in the [place], hitting the back of her neck. Pt states it didn't hurt last night as much as it is now. Pt reports R arm pain, bruise on buttock, lump on back of neck, pain radiating down from neck to back, R hip pain (only when lying down). Pt has taken tylenol last dose at 1230 with no improvement. pt reports pain with movement and breathing. Pt states "it feels like someone is ripping her spinal column out. Pt states she can't lift head  ? ? ?Pt denies LOC.  Says pain much worse today than yesterday. ? ? ? ?Relevant past medical, surgical, family and social history reviewed and updated as indicated. Interim medical history since our last visit reviewed. ?Allergies and medications reviewed and updated. ? ?Review of Systems ? ?Per HPI unless specifically indicated above ? ?   ?Objective:  ?  ?Pulse 61   Temp (!) 97.1 ?F (36.2 ?C)   SpO2 97%   ?Wt Readings from Last 3 Encounters:  ?12/10/21 200 lb (90.7 kg)  ?11/17/21 197 lb (89.4 kg)  ?11/12/21 203 lb 3.2 oz (92.2 kg)  ?  ?Physical Exam ?Pulmonary:  ?   Effort: No respiratory distress.  ?Neurological:  ?   Mental Status:  She is alert and oriented to person, place, and time.  ?Psychiatric:     ?   Behavior: Behavior is cooperative.  ? ? ?Pt is without obvious bruising or deformity.  She is barely able to stand unassisted.  She yells out when touched or moved.   ? ? ? ?   ?Assessment & Plan:  ? ? ?Encounter Diagnosis  ?Name Primary?  ? Fall, initial encounter Yes  ? ? ? ?Pt was sent to ER for evaluation and treatment.  Remo Lipps at [facility] ER triage desk was notified that pt is on her way ? ? ?

## 2021-12-16 NOTE — Telephone Encounter (Signed)
Pt was called on 01/14/22 to review results left shoulder xray.  She relates that her neurologist at [facility] wants her to be referred to rheumatologist.  Those notes are reviewed.   Telephone note dated 11/26/21 from [facility]-    "Her ANCA result is low positive. Would advise her to discuss with PCP about a referral to rheumatology for further workup.".  will place referral to rheumatology.   ?

## 2021-12-16 NOTE — ED Triage Notes (Addendum)
Pt hit her head while getting in the [place] and then slipped and fell back landing flat on her back.  C/o back pain. Family member states pt went to her doctor and doctor sent here to be seen. Pt uncooperative in getting her vitals in triage.  Pt restless and crying/yelling in triage. ?

## 2021-12-16 NOTE — ED Provider Notes (Signed)
?[facility] ?Provider Note ? ?CSN: [id number] ?Arrival date & time: 12/16/21 1448 ? ?Chief Complaint(s) ?No chief complaint on file. ? ?HPI ?Briana Wyatt is a 51 y.o. female who presents emergency department for evaluation of neck and back pain after a fall.  Patient states that she was getting in and out of a van, slipped back and struck her back and neck on the side of the door.  This event occurred yesterday.  She states that she has a chronic history of numbness at the right fifth digit but she feels the numbness is worse today.  She saw her primary doctor who transferred her to the emergency department for trauma evaluation.  She endorses significant pain to the neck, T-spine and L-spine.  She states that she is able to walk but this is limited by pain.  Denies numbness, tingling, weakness in the other extremities.  No visual changes.  No additional traumatic or systemic complaints. ? ? ?Past Medical History ?Past Medical History:  ?Diagnosis Date  ? Acid reflux   ? 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 Hypertension   ? Restless leg syndrome   ? Sciatica of right side   ? ?Patient Active Problem List  ? Diagnosis Date Noted  ? 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 Cocaine-induced mood disorder ([place])   ? ?Home Medication(s) ?Prior to Admission medications   ?Medication Sig Start Date End Date Taking? Authorizing Provider  ?acetaminophen (TYLENOL) 650 MG CR tablet Take 650 mg by mouth as needed. 03/02/19   [provider]  ?albuterol (VENTOLIN HFA) 108 (90 Base) MCG/ACT inhaler Inhale 2 puffs into the lungs every 6 (six) hours as needed for wheezing or shortness of breath. 09/24/21   Soyla Dryer, PA-C  ?baclofen (LIORESAL) 10 MG tablet Take 10 mg by mouth 2 (two) times daily. ?Patient not taking: Reported on 12/16/2021    [provider]  ?Calcium Carbonate-Vitamin D (CALTRATE 600+D PO) Take by mouth.    [provider]  ?cetirizine (ZYRTEC) 10 MG tablet Take 10 mg by mouth daily. 05/05/20   [provider]  ?Cholecalciferol (VITAMIN D-3 PO) Take 1 tablet by mouth daily.    [provider]  ?DULoxetine (CYMBALTA) 60 MG capsule Take 60 mg by mouth daily. ?Patient not taking: Reported on 12/10/2021 03/02/19   [provider]  ?gabapentin (NEURONTIN) 300 MG capsule Take 1 capsule (300 mg total) by mouth 2 (two) times daily. 11/18/21   Soyla Dryer, PA-C  ?losartan (COZAAR) 50 MG tablet Take 1 tablet (50 mg total) by mouth daily. 09/24/21   Soyla Dryer, PA-C  ?Multiple Vitamins-Minerals (CENTRUM SILVER PO) Take 1 tablet by mouth daily.    [provider]  ?omeprazole (PRILOSEC) 40 MG capsule Take 1 capsule (40 mg total) by mouth daily. 09/24/21   Soyla Dryer, PA-C  ?traMADol (ULTRAM) 50 MG tablet Take 1 tablet (50 mg total) by mouth every 12 (twelve) hours as needed. 11/27/21   Sanjuana Kava, MD  ?                                                                                                                                  ?  Past Surgical History ?Past Surgical History:  ?Procedure Laterality Date  ? CARPAL TUNNEL RELEASE Bilateral   ? ECTOPIC PREGNANCY SURGERY    ? SHOULDER SURGERY Right   ? TUBAL LIGATION    ? ?Family History ?Family History  ?Problem Relation Age of Onset  ? Hypertension Father   ? Stroke Father   ? ? ?Social History ?Social History  ? ?Tobacco Use  ? Smoking status: Former  ?  Types: Cigarettes  ?  Quit date: 08/25/1995  ?  Years since quitting: 26.3  ? Smokeless tobacco: Never  ?Vaping Use  ? Vaping Use: Never used  ?Substance Use Topics  ? Alcohol use: No  ? Drug use: Not Currently  ?  Types: Marijuana, Cocaine  ?  Comment: denies use 10/02/21  ? ?Allergies ?Codeine, Methocarbamol, and Nsaids ? ?Review of Systems ?Review of Systems  ?Musculoskeletal:  Positive for back pain and neck pain.  ? ?Physical Exam ?Vital Signs  ?I have reviewed the triage vital  signs ?BP (!) 135/96 (BP Location: Right Arm)   Pulse (!) 105   Resp 18   SpO2 93%  ? ?Physical Exam ?Vitals and nursing note reviewed.  ?Constitutional:   ?   General: She is in acute distress.  ?   Appearance: She is well-developed.  ?HENT:  ?   Head: Normocephalic and atraumatic.  ?Eyes:  ?   Conjunctiva/sclera: Conjunctivae normal.  ?Cardiovascular:  ?   Rate and Rhythm: Normal rate and regular rhythm.  ?   Heart sounds: No murmur heard. ?Pulmonary:  ?   Effort: Pulmonary effort is normal. No respiratory distress.  ?   Breath sounds: Normal breath sounds.  ?Abdominal:  ?   Palpations: Abdomen is soft.  ?   Tenderness: There is no abdominal tenderness.  ?Musculoskeletal:     ?   General: Tenderness present. No swelling.  ?   Cervical back: Neck supple. Tenderness present.  ?Skin: ?   General: Skin is warm and dry.  ?   Capillary Refill: Capillary refill takes less than 2 seconds.  ?Neurological:  ?   Mental Status: She is alert.  ?Psychiatric:     ?   Mood and Affect: Mood normal.  ? ? ?ED Results and Treatments ?Labs ?(all labs ordered are listed, but only abnormal results are displayed) ?Labs Reviewed - No data to display                                                                                                                       ? ?Radiology ?No results found. ? ?Pertinent labs & imaging results that were available during my care of the patient were reviewed by me and considered in my medical decision making (see MDM for details). ? ?Medications Ordered in ED ?Medications  ?morphine (PF) 4 MG/ML injection 4 mg (4 mg Intramuscular Given 12/16/21 1549)  ?ondansetron (ZOFRAN-ODT) disintegrating tablet 4 mg (4 mg Oral Given 12/16/21 1550)  ?                                                               ?                                                                    ?  Procedures ?Procedures ? ?(including critical care time) ? ?Medical Decision Making / ED Course ? ? ?This patient presents to the ED for  concern of headache, neck pain, back pain, this involves an extensive number of treatment options, and is a complaint that carries with it a high risk of complications and morbidity.  The differential diagnosis includes cervical strain, contusion, fracture ? ?MDM: ?Patient seen emergency department for evaluation of multiple complaints as described above.  Physical exam reveals tenderness in the paraspinal muscles in the C-spine and along the entirety of the T and L-spine.  Neurologic exam is unremarkable with no true focal motor or sensory deficits.  CT of the head, C-spine, T-spine, L-spine all reassuringly negative with no evidence of fracture.  Patient given pain control and a headache cocktail and on reevaluation patient symptoms have improved.  With negative imaging she is safe for discharge.  Patient then discharged. ? ? ?Additional history obtained: ?-Additional history obtained from mom ?-External records from outside source obtained and reviewed including: Chart review including previous notes, labs, imaging, consultation notes ? ? ? ?Imaging Studies ordered: ?I ordered imaging studies including CTH, CT C spine, CT T spine, CT L spine ?I independently visualized and interpreted imaging. ?I agree with the radiologist interpretation ? ? ?Medicines ordered and prescription drug management: ?Meds ordered this encounter  ?Medications  ? morphine (PF) 4 MG/ML injection 4 mg  ? ondansetron (ZOFRAN-ODT) disintegrating tablet 4 mg  ?  ?-I have reviewed the patients home medicines and have made adjustments as needed ? ?Critical interventions ?none ? ?Social Determinants of Health:  ?Factors impacting patients care include: none ? ? ?Reevaluation: ?After the interventions noted above, I reevaluated the patient and found that they have :improved ? ?Co morbidities that complicate the patient evaluation ? ?Past Medical History:  ?Diagnosis Date  ? Acid reflux   ? 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 Hypertension   ? Restless leg syndrome   ? Sciatica of right side   ?  ? ? ?Dispostion: ?I considered admission for this patient, with negative trauma imaging she is safe for discharge ? ? ?

## 2021-12-16 NOTE — ED Notes (Signed)
C-collar applied to pt.

## 2021-12-17 ENCOUNTER — Ambulatory Visit (HOSPITAL_COMMUNITY): Payer: Self-pay | Attending: Orthopedic Surgery

## 2021-12-17 ENCOUNTER — Other Ambulatory Visit: Payer: Self-pay | Admitting: Physician Assistant

## 2021-12-17 ENCOUNTER — Encounter (HOSPITAL_COMMUNITY): Payer: Medicaid Other

## 2021-12-17 ENCOUNTER — Encounter (HOSPITAL_COMMUNITY): Payer: Self-pay

## 2021-12-17 DIAGNOSIS — M25521 Pain in right elbow: Secondary | ICD-10-CM | POA: Insufficient documentation

## 2021-12-17 NOTE — Therapy (Signed)
?OUTPATIENT OCCUPATIONAL THERAPY ORTHO EVALUATION ? ?Patient Name: Briana Wyatt ?MRN: [id number] ?DOB:[birth year], 51 y.o., female ?Today's Date: 12/17/2021 ? ?PCP: Soyla Dryer, PA-C ?REFERRING PROVIDER: Larena Glassman, MD ? ? OT End of Session - 12/17/21 1705   ? ? Visit Number 1   ? Number of Visits 1   ? Authorization Type Self Pay   ? OT Start Time 1430   ? OT Stop Time 1508   ? OT Time Calculation (min) 38 min   ? Activity Tolerance Patient tolerated treatment well   ? Behavior During Therapy [facility] for tasks assessed/performed   ? ?  ?  ? ?  ? ? ?Past Medical History:  ?Diagnosis Date  ? Acid reflux   ? 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 Hypertension   ? Restless leg syndrome   ? Sciatica of right side   ? ?Past Surgical History:  ?Procedure Laterality Date  ? CARPAL TUNNEL RELEASE Bilateral   ? ECTOPIC PREGNANCY SURGERY    ? SHOULDER SURGERY Right   ? TUBAL LIGATION    ? ?Patient Active Problem List  ? Diagnosis Date Noted  ? 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 Cocaine-induced mood disorder ([place])   ? ? ?ONSET DATE: No date provided ? ?REFERRING DIAG: Cubital tunnel syndrome - right arm. ? ?THERAPY DIAG:  ?Pain in right elbow - Plan: Ot plan of care cert/re-cert ? ?SUBJECTIVE:  ? ?SUBJECTIVE STATEMENT: ?S: My elbow is really tender on the inside and it make my two fingers numb. ?Pt accompanied by: self ? ?PERTINENT HISTORY: patient is a 51 y/o female S/P right cubital tunnel syndrome referred by Dr. Amedeo Kinsman for a custom fabricated elbow extension splint.  ? ?PRECAUTIONS: None ? ?WEIGHT BEARING RESTRICTIONS No ? ?PAIN:  ?Are you having pain? No ? ?FALLS: Has patient fallen in last 6 months? Yes. Number of falls 1 ? ?LIVING ENVIRONMENT: ?Lives with: lives with their family ?Lives in: Other [place] ? ? ?PLOF: Independent with basic ADLs ? ?PATIENT GOALS None stated ? ?OBJECTIVE:  ? ?HAND DOMINANCE: Right ? ?ADLs: ?Overall ADLs: Reports that she will experience  pain in right elbow. At night, pain and numbness in ring and small finger is experienced.  ? ? ? ? ?UE ROM   - Right UE shoulder, elbow, wrist, and hand A/ROM is WFL in all ranges. Functional grip strength. ? ? ? ? ? ?COGNITION: ?Overall cognitive status: Within functional limits for tasks assessed ? ? ? ?TODAY'S TREATMENT:  ?Splinting: right elbow extension splint fabricated with three 3 inch straps. Four sockette ? ? ?PATIENT EDUCATION: ?Education details: splint education, care management, precautions, donning/doffing technique. ?Person educated: Patient ?Education method: Explanation, Demonstration, Verbal cues, and Handouts ?Education comprehension: verbalized understanding and returned demonstration ? ? ? ? ?GOALS: ? ? ?SHORT TERM GOALS: Target date:  12/17/21 ? ?Patient will be educated and verbalize understanding of use, wearing schedule, care/cleaning and donning/doffing technique of custom fabricated orthosis.  ?Baseline: ?Goal status: MET ? ?2.  Patient will be provided with a custom fabricated elbow extension splint for her right elbow in order to decrease pain and numbness experienced in elbow at night.  ?Baseline:  ?Goal status: MET ? ? ?ASSESSMENT: ? ?CLINICAL IMPRESSION: ?Patient is a 51 y.o. female who was seen today for occupational therapy evaluation for custom splint fabrication for right cubital tunnel syndrome causing increased pain/discomfort and numbness in hand when trying to sleep at night.  ? ?PERFORMANCE DEFICITS in functional  skills including sensation and pain. ?IMPAIRMENTS are limiting patient from rest and sleep.  ? ?COMORBIDITIES has no other Wyatt-morbidities that affects occupational performance. Patient will benefit from skilled OT to address above impairments and improve overall function. ? ?MODIFICATION OR ASSISTANCE TO COMPLETE EVALUATION: No modification of tasks or assist necessary to complete an evaluation. ? ?OT OCCUPATIONAL PROFILE AND HISTORY: Problem focused assessment:  Including review of records relating to presenting problem. ? ?CLINICAL DECISION MAKING: LOW - limited treatment options, no task modification necessary ? ?REHAB POTENTIAL: Excellent ? ?EVALUATION COMPLEXITY: Low ? ? ? ? ? ?PLAN: ?OT FREQUENCY: one time visit ? ? ? ?PLANNED INTERVENTIONS: splinting ? ? ? ?CONSULTED AND AGREED WITH PLAN OF CARE: Patient ? ?PLAN FOR NEXT SESSION: P: One time visit for splint fabrication. Pt will call if any adjustments are needed. Follow up with MD when scheduled.  ? ? ?Ailene Ravel, OTR/L,CBIS  ?[phone] ? ?12/17/2021, 5:19 PM ?  ?

## 2021-12-17 NOTE — Patient Instructions (Signed)
Your Splint ?This splint should initially be fitted by a healthcare practitioner.  The healthcare practitioner is responsible for providing wearing instructions and precautions to the patient, other healthcare practitioners and care provider involved in the patient's care.  This splint was custom made for you. Please read the following instructions to learn about wearing and caring for your splint. ? ?Precautions ?Should your splint cause any of the following problems, remove the splint immediately and contact your therapist/physician. ?Swelling ?Severe Pain ?Pressure Areas ?Stiffness ?Numbness ? ?Do not wear your splint while operating machinery unless it has been fabricated for that purpose. ? ?When To Wear Your Splint ?Where your splint according to your therapist/physician instructions. ?Night time when sleeping only. ? ?Care and Cleaning of Your Splint ?Keep your splint away from open flames. ?Your splint will lose its shape in temperatures over 135 degrees Farenheit, ( in car windows, near radiators, ovens or in hot water).  Never make any adjustments to your splint, if the splint needs adjusting remove it and make an appointment to see your therapist. ?Your splint, including the cushion liner may be cleaned with soap and lukewarm water.  Do not immerse in hot water over 135 degrees Farenheit. ?Straps may be washed with soap and water, but do not moisten the self-adhesive portion. ?For ink or hard to remove spots use a scouring cleanser which contains chlorine.  Rinse the splint thoroughly after using chlorine cleanser. ? ?

## 2021-12-18 ENCOUNTER — Ambulatory Visit (INDEPENDENT_AMBULATORY_CARE_PROVIDER_SITE_OTHER): Payer: Self-pay | Admitting: Physical Medicine and Rehabilitation

## 2021-12-18 ENCOUNTER — Ambulatory Visit: Payer: Self-pay

## 2021-12-18 ENCOUNTER — Other Ambulatory Visit: Payer: Self-pay | Admitting: Physician Assistant

## 2021-12-18 ENCOUNTER — Encounter: Payer: Self-pay | Admitting: Physical Medicine and Rehabilitation

## 2021-12-18 DIAGNOSIS — R93429 Abnormal radiologic findings on diagnostic imaging of unspecified kidney: Secondary | ICD-10-CM

## 2021-12-18 DIAGNOSIS — G8929 Other chronic pain: Secondary | ICD-10-CM

## 2021-12-18 DIAGNOSIS — M25511 Pain in right shoulder: Secondary | ICD-10-CM

## 2021-12-18 MED ORDER — TRIAMCINOLONE ACETONIDE 40 MG/ML IJ SUSP
40.0000 mg | INTRAMUSCULAR | Status: AC | PRN
  Administered 2021-12-18: 40 mg via INTRA_ARTICULAR

## 2021-12-18 MED ORDER — BUPIVACAINE HCL 0.25 % IJ SOLN
5.0000 mL | INTRAMUSCULAR | Status: AC | PRN
  Administered 2021-12-18: 5 mL via INTRA_ARTICULAR

## 2021-12-18 MED ORDER — LIDOCAINE HCL 1 % IJ SOLN
4.0000 mL | INTRAMUSCULAR | Status: AC | PRN
  Administered 2021-12-18: 4 mL

## 2021-12-18 NOTE — Progress Notes (Signed)
Pt state right shoulder pain. Pt state any movement makes the pain worse. Pt state she takes pain meds to help ease her pain. ? ?Numeric Pain Rating Scale and Functional Assessment ?Average Pain 8 ? ? ?In the last MONTH (on 0-10 scale) has pain interfered with the following? ? ?1. General activity like being  able to carry out your everyday physical activities such as walking, climbing stairs, carrying groceries, or moving a chair?  ?Rating(10) ? ? ?-BT, -Dye Allergies. ? ?

## 2021-12-18 NOTE — Progress Notes (Signed)
? ?  Briana Wyatt - 51 y.o. female MRN [id number]  Date of birth: [birth year] ? ?Office Visit Note: ?Visit Date: 12/18/2021 ?PCP: Soyla Dryer, PA-C ?Referred by: Mordecai Rasmussen, MD ? ?Subjective: ?Chief Complaint  ?Patient presents with  ? Right Shoulder - Pain  ? ?HPI:  Briana Wyatt is a 51 y.o. female who comes in today at the request of Dr. Larena Glassman for planned Right anesthetic glenohumeral arthrogram with fluoroscopic guidance.  The patient has failed conservative care including home exercise, medications, time and activity modification.  This injection will be diagnostic and hopefully therapeutic.  Please see requesting physician notes for further details and justification.  ? ?ROS Otherwise per HPI. ? ?Assessment & Plan: ?Visit Diagnoses:  ?  ICD-10-CM   ?1. Chronic right shoulder pain  M25.511 XR C-ARM NO REPORT  ? 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  ?  ?Plan: No additional findings.  ? ?Meds & Orders: No orders of the defined types were placed in this encounter. ?  ?Orders Placed This Encounter  ?Procedures  ? Large Joint Inj: R glenohumeral  ? XR C-ARM NO REPORT  ?  ?Follow-up: Return if symptoms worsen or fail to improve.  ? ?Procedures: ?Large Joint Inj: R glenohumeral on 12/18/2021 2:31 PM ?Indications: pain and diagnostic evaluation ?Details: 22 G 3.5 in needle, fluoroscopy-guided anteromedial approach ? ?Arthrogram: No ? ?Medications: 40 mg triamcinolone acetonide 40 MG/ML; 5 mL bupivacaine 0.25 %; 4 mL lidocaine 1 % ?Outcome: tolerated well, no immediate complications ? ?There was excellent flow of contrast producing a partial arthrogram of the glenohumeral joint. The patient reports not much in the way of pain relief during the anesthetic phase.  She reports that she felt more popping sensation after the injection.  She did seem to have more range of motion. ?Procedure, treatment alternatives, risks and benefits explained, specific risks discussed. Consent was given by the  patient. Immediately prior to procedure a time out was called to verify the correct patient, procedure, equipment, support staff and site/side marked as required. Patient was prepped and draped in the usual sterile fashion.  ? ?  ?   ? ?Clinical History: ?No specialty comments available.  ? ? ? ?Objective:  VS:  HT:    WT:   BMI:     BP:   HR: bpm  TEMP: ( )  RESP:  ?Physical Exam  ? ?Imaging: ?XR C-ARM NO REPORT ? ?Result Date: 12/18/2021 ?Please see Notes tab for imaging impression.  ?

## 2021-12-19 ENCOUNTER — Other Ambulatory Visit: Payer: Self-pay | Admitting: Physician Assistant

## 2021-12-21 ENCOUNTER — Other Ambulatory Visit: Payer: Self-pay | Admitting: Orthopedic Surgery

## 2021-12-22 ENCOUNTER — Encounter: Payer: Self-pay | Admitting: Nurse Practitioner

## 2021-12-22 ENCOUNTER — Ambulatory Visit (INDEPENDENT_AMBULATORY_CARE_PROVIDER_SITE_OTHER): Payer: Self-pay | Admitting: Nurse Practitioner

## 2021-12-22 VITALS — BP 124/87 | HR 56 | Ht 66.0 in | Wt 197.0 lb

## 2021-12-22 DIAGNOSIS — E041 Nontoxic single thyroid nodule: Secondary | ICD-10-CM

## 2021-12-22 DIAGNOSIS — R7989 Other specified abnormal findings of blood chemistry: Secondary | ICD-10-CM

## 2021-12-22 NOTE — Progress Notes (Signed)
? ? ? 12/22/2021   ? ? ?Endocrinology Consult Note  ? ? ?Subjective:  ? ? Patient ID: Briana Wyatt, female    DOB: Oct 19, 1970, PCP Soyla Dryer, PA-C. ? ? ?Past Medical History:  ?Diagnosis Date  ? Acid reflux   ? 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 Hypertension   ? Restless leg syndrome   ? Sciatica of right side   ? ? ?Past Surgical History:  ?Procedure Laterality Date  ? CARPAL TUNNEL RELEASE Bilateral   ? ECTOPIC PREGNANCY SURGERY    ? SHOULDER SURGERY Right   ? TUBAL LIGATION    ? ? ?Social History  ? ?Socioeconomic History  ? Marital status: Single  ?  Spouse name: Not on file  ? Number of children: 3  ? Years of education: Not on file  ? Highest education level: GED or equivalent  ?Occupational History  ? Not on file  ?Tobacco Use  ? Smoking status: Former  ?  Types: Cigarettes  ?  Quit date: 08/25/1995  ?  Years since quitting: 26.3  ? Smokeless tobacco: Never  ?Vaping Use  ? Vaping Use: Never used  ?Substance and Sexual Activity  ? Alcohol use: No  ? Drug use: Not Currently  ?  Types: Marijuana, Cocaine  ?  Comment: denies use 10/02/21  ? Sexual activity: Not Currently  ?  Birth control/protection: Surgical  ?Other Topics Concern  ? Not on file  ?Social History Narrative  ? Not on file  ? ?Social Determinants of Health  ? ?Financial Resource Strain: Not on file  ?Food Insecurity: No Food Insecurity  ? Worried About Charity fundraiser in the Last Year: Never true  ? Ran Out of Food in the Last Year: Never true  ?Transportation Needs: No Transportation Needs  ? Lack of Transportation (Medical): No  ? Lack of Transportation (Non-Medical): No  ?Physical Activity: Not on file  ?Stress: Not on file  ?Social Connections: Not on file  ? ? ?Family History  ?Problem Relation Age of Onset  ? Hypertension Father   ? Stroke Father   ? ? ?Outpatient Encounter Medications as of 12/22/2021  ?Medication Sig  ? acetaminophen (TYLENOL) 650 MG CR tablet Take 650 mg by  mouth as needed.  ? albuterol (VENTOLIN HFA) 108 (90 Base) MCG/ACT inhaler INHALE 2 PUFFS BY MOUTH EVERY 6 HOURS AS NEEDED FOR COUGHING, WHEEZING, OR SHORTNESS OF BREATH  ? baclofen (LIORESAL) 10 MG tablet Take 10 mg by mouth 2 (two) times daily.  ? Calcium Carbonate-Vitamin D (CALTRATE 600+D PO) Take by mouth.  ? cetirizine (ZYRTEC) 10 MG tablet Take 10 mg by mouth daily.  ? Cholecalciferol (VITAMIN D-3 PO) Take 1 tablet by mouth daily.  ? gabapentin (NEURONTIN) 300 MG capsule TAKE 1 CAPSULE(300 MG) BY MOUTH TWICE DAILY  ? lidocaine (LIDODERM) 5 % Place 1 patch onto the skin daily. Remove & Discard patch within 12 hours or as directed by MD  ? losartan (COZAAR) 50 MG tablet TAKE 1 Tablet BY MOUTH ONCE EVERY DAY  ? Multiple Vitamins-Minerals (CENTRUM SILVER PO) Take 1 tablet by mouth daily.  ? traMADol (ULTRAM) 50 MG tablet Take 1 tablet (50 mg total) by mouth every 12 (twelve) hours as needed.  ? [DISCONTINUED] omeprazole (PRILOSEC) 40 MG capsule Take 1 capsule (40 mg total) by mouth daily.  ? ?No facility-administered encounter medications on file as of 12/22/2021.  ? ? ?ALLERGIES: ?Allergies  ?Allergen Reactions  ?  Codeine Nausea And Vomiting  ?  High doses of codeine pt reports greater than '30mg'$  she can't tolerate ?  ? Methocarbamol Itching  ?  "feels like bugs are crawling under skin"  ? Nsaids   ?  Other reaction(s): Contraindicated ?CKD, only one kidney ?Other reaction(s): Contraindicated ?CKD, only one kidney  ? ? ?VACCINATION STATUS: ?Immunization History  ?Administered Date(s) Administered  ? Moderna SARS-COV2 Booster Vaccination 06/27/2020  ? Moderna Sars-Covid-2 Vaccination 12/14/2019, 01/18/2020  ? Tdap 04/18/2018  ? ? ? ?HPI ? ?Briana Wyatt is 51 y.o. female who presents today with a medical history as above. she is being seen in consultation for hyperthyroidism and multinodular thyroid requested by Soyla Dryer, PA-C.  she has been dealing with symptoms of heat intolerance, anxiety, difficulty  swallowing, difficulty breathing while laying down, and palpitations on/off since 2009. These symptoms are progressively worsening and troubling to her.  her most recent thyroid labs revealed mildly suppressed TSH of 0.21 on 10/08/21 (no corresponding FreeT4 done at that time). ? ?she denies choking, no recent voice change.  She does have associated dysphagia and shortness of breath. ?  ?she does have family history of thyroid dysfunction in her mother (hypothyroidism), but denies family hx of thyroid cancer. She had been seeing endocrinologist in [place] but moved back to [place] about 1 year ago.  She previously had ultrasound in 2009 and had a large nodule on right gland biopsied (unsure of exact location) which was benign.  She has also been on Methimazole in the past, not currently on any medication. ? ? she is not on any anti-thyroid medications nor on any thyroid hormone supplements. Denies use of Biotin containing supplements.  she is willing to proceed with appropriate work up and therapy for thyrotoxicosis and MNG. ? ? ?Review of systems ? ?Constitutional: + Minimally fluctuating body weight, current Body mass index is 31.8 kg/m?., no fatigue, + subjective hyperthermia, no subjective hypothermia, excessive sweating ?Eyes: no blurry vision, no xerophthalmia ?ENT: no sore throat, + nodules palpated in throat, + dysphagia, no odynophagia, no hoarseness ?Cardiovascular: no chest pain, + shortness of breath, + palpitations, no leg swelling ?Respiratory: no cough, + shortness of breath ?Gastrointestinal: no nausea/vomiting/diarrhea ?Musculoskeletal: c/o right shoulder pain-chronic ?Skin: no rashes, no hyperemia ?Neurological: no tremors, no numbness, no tingling, no dizziness ?Psychiatric: no depression, + anxiety ? ? ?Objective:  ?  ?BP 124/87   Pulse (!) 56   Ht '5\' 6"'$  (1.676 m)   Wt 197 lb (89.4 kg)   BMI 31.80 kg/m?   ?Wt Readings from Last 3 Encounters:  ?12/22/21 197 lb (89.4 kg)  ?12/10/21 200 lb (90.7 kg)   ?11/17/21 197 lb (89.4 kg)  ?  ? ?BP Readings from Last 3 Encounters:  ?12/22/21 124/87  ?12/16/21 105/70  ?12/10/21 (!) 140/93  ?                     ? ?Physical Exam- Limited ? ?Constitutional:  Body mass index is 31.8 kg/m?. , not in acute distress, moderately anxious state of mind, fidgety ?Eyes:  EOMI, no exophthalmos ?Neck: Supple ?Thyroid: + gross goiter R>L with palpable right sided nodule; tenderness to palpation- right inferior ?Cardiovascular: RRR, no murmurs, rubs, or gallops, no edema ?Respiratory: Adequate breathing efforts, no crackles, rales, rhonchi, or wheezing ?Musculoskeletal: no gross deformities, strength intact in all four extremities, no gross restriction of joint movements ?Skin:  no rashes, no hyperemia, + multiple scabbed areas to BUE and BLE in various  stages of healing ?Neurological: no tremor with outstretched hands ? ? ?CMP  ?   ?Component Value Date/Time  ? NA 141 10/08/2021 1232  ? K 4.6 10/08/2021 1232  ? CL 109 10/08/2021 1232  ? CO2 21 (L) 10/08/2021 1232  ? GLUCOSE 109 (H) 10/08/2021 1232  ? BUN 25 (H) 10/08/2021 1232  ? CREATININE 0.93 10/08/2021 1232  ? CALCIUM 9.6 10/08/2021 1232  ? PROT 7.6 10/08/2021 1232  ? ALBUMIN 3.7 10/08/2021 1232  ? AST 20 10/08/2021 1232  ? ALT 26 10/08/2021 1232  ? ALKPHOS 99 10/08/2021 1232  ? BILITOT 0.3 10/08/2021 1232  ? GFRNONAA >60 10/08/2021 1232  ? ? ? ?CBC ?   ?Component Value Date/Time  ? WBC 5.8 09/07/2021 1527  ? RBC 4.71 09/07/2021 1527  ? HGB 13.0 09/07/2021 1527  ? HCT 40.6 09/07/2021 1527  ? PLT 257 09/07/2021 1527  ? MCV 86.2 09/07/2021 1527  ? MCH 27.6 09/07/2021 1527  ? MCHC 32.0 09/07/2021 1527  ? RDW 11.9 09/07/2021 1527  ? LYMPHSABS 0.7 07/09/2021 1230  ? MONOABS 0.5 07/09/2021 1230  ? EOSABS 0.1 07/09/2021 1230  ? BASOSABS 0.0 07/09/2021 1230  ? ? ? ?Diabetic Labs (most recent): ?Lab Results  ?Component Value Date  ? HGBA1C 5.8 (H) 10/08/2021  ? HGBA1C 5.9 (H) 09/17/2020  ? ? ?Lipid Panel  ?   ?Component Value Date/Time  ?  CHOL 153 10/08/2021 1232  ? TRIG 107 10/08/2021 1232  ? HDL 42 10/08/2021 1232  ? CHOLHDL 3.6 10/08/2021 1232  ? VLDL 21 10/08/2021 1232  ? [place] 90 10/08/2021 1232  ? ? ? ?Lab Results  ?Component Value Date  ? TSH

## 2021-12-23 ENCOUNTER — Ambulatory Visit: Payer: Medicaid Other | Admitting: Licensed Clinical Social Worker

## 2021-12-25 ENCOUNTER — Encounter (HOSPITAL_COMMUNITY): Payer: Medicaid Other | Admitting: Occupational Therapy

## 2021-12-25 ENCOUNTER — Telehealth (HOSPITAL_COMMUNITY): Payer: Self-pay | Admitting: Occupational Therapy

## 2021-12-25 NOTE — Telephone Encounter (Signed)
L/m to cx she is stuck in [place] and will not make it back in town for this visit.Marland Kitchen ?

## 2021-12-26 ENCOUNTER — Ambulatory Visit (HOSPITAL_COMMUNITY)
Admission: RE | Admit: 2021-12-26 | Discharge: 2021-12-26 | Disposition: A | Discharge status: Home / Self Care | Payer: Medicaid Other | Source: Ambulatory Visit | Attending: Physician Assistant | Admitting: Physician Assistant

## 2021-12-26 DIAGNOSIS — R93429 Abnormal radiologic findings on diagnostic imaging of unspecified kidney: Secondary | ICD-10-CM

## 2021-12-29 NOTE — Therapy (Signed)
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 Balance Issues-Per Urbano Heir MD   THERAPY DIAG:  Low back pain, unspecified back pain laterality, unspecified chronicity, unspecified whether sciatica present  Difficulty in walking, not elsewhere classified  Other symptoms and signs involving the musculoskeletal system  Balance problem  ONSET DATE: 2 weeks ago  SUBJECTIVE:                                                                                                                                                                                           SUBJECTIVE STATEMENT: Patient reports back pain for the past 2 weeks since she fell; constant pain.  Went to ER; CT scans done there. Discharged from hospital; saw neurologist Dr. Renda Rolls and referred to physical therapy. Has had several falls over the past month.  Today  she states she is lightheaded feeling.  After sitting and drinking some water feels a little better.  PERTINENT HISTORY:  Seeing OT for M25.512 (ICD-10-CM) - Left shoulder pain, unspecified chronicity Movement disorder?  Migraines  PAIN:  Are you having pain? Yes: NPRS scale: 8/10 Pain location: mid back to low back Pain description: aching, constant Aggravating factors: moving Relieving factors: nothing   PRECAUTIONS: Fall  WEIGHT BEARING RESTRICTIONS No  FALLS:  Has patient fallen in last 6 months? Yes. Number of  falls 4  LIVING ENVIRONMENT: Lives with: lives alone Lives in: Other modified Zenaida Niece Stairs: Yes: External: 2 steps; antique grab bar Has following equipment at home: Single point cane, Walker - 4 wheeled, shower chair, and Grab bars  OCCUPATION: disability  PLOF: Independent with basic ADLs  PATIENT GOALS less pain   OBJECTIVE:   DIAGNOSTIC FINDINGS:  CLINICAL DATA:  Fell landing flat on back, back pain   EXAM: CT THORACIC AND LUMBAR SPINE WITHOUT CONTRAST   TECHNIQUE: Multidetector CT imaging of the thoracic and lumbar spine was performed without contrast. Multiplanar CT image reconstructions were also generated.   RADIATION DOSE REDUCTION: This exam was performed according to the departmental dose-optimization program which includes automated exposure control, adjustment of the mA and/or kV according to patient size and/or use of iterative reconstruction technique.   COMPARISON:  None   FINDINGS: CT THORACIC SPINE FINDINGS   Alignment: Normal   Vertebrae: Vertebral body heights maintained. Disc space heights fairly well maintained. Scattered endplate spur formation lower thoracic spine. No fracture, subluxation, or bone destruction.   Paraspinal and other soft tissues: Paraspinal soft tissues unremarkable. Normal adrenal glands. Small cluster of cysts with minimal soft tissue at LEFT renal bed question multi-cystic dysplastic kidney,  largest cyst 2.7 cm diameter. Remaining paraspinal soft tissues unremarkable.   Disc levels: No obvious disc herniation or intraspinal mass   CT LUMBAR SPINE FINDINGS   Segmentation: Normal   Alignment: Normal   Vertebrae: Vertebral body and disc space heights maintained. No fracture, subluxation, or bone destruction. SI joints unremarkable.   Paraspinal and other soft tissues: No additional abnormalities   Disc levels: Probable calcified disc fragment centrally at L4-L5. Bulging disc L3-L4.   IMPRESSION: CT THORACIC SPINE IMPRESSION   No acute thoracic spine abnormalities.   Small cluster of cysts with minimal soft tissue at LEFT renal fossa question multi-cystic dysplastic kidney.   CT LUMBAR SPINE IMPRESSION   No acute lumbar spine abnormalities.   Question did calcified disc fragment L4-L5.  CLINICAL DATA:  Hit her head while getting out of and and slipped and fell landing on her back, back pain   EXAM: CT HEAD WITHOUT CONTRAST   CT CERVICAL SPINE WITHOUT CONTRAST   TECHNIQUE: Multidetector CT imaging of the head and cervical spine was performed following the standard protocol without intravenous contrast. Multiplanar CT image reconstructions of the cervical spine were also generated.   RADIATION DOSE REDUCTION: This exam was performed according to the departmental dose-optimization program which includes automated exposure control, adjustment of the mA and/or kV according to patient size and/or use of iterative reconstruction technique.   COMPARISON:  CT head 09/14/2020   FINDINGS: CT HEAD FINDINGS   Brain: Normal ventricular morphology. No midline shift or mass effect. Minimal small vessel chronic ischemic changes of deep cerebral white matter. Otherwise normal appearance of brain parenchyma. No intracranial hemorrhage, mass lesion, or evidence of acute infarction. No extra-axial fluid collections.   Vascular: No hyperdense vessels.   Skull:  Calvaria intact   Sinuses/Orbits: Clear   Other: N/A   CT CERVICAL SPINE FINDINGS   Alignment: Normal   Skull base and vertebrae: Osseous mineralization normal. Vertebral body and disc space heights maintained. No fracture, subluxation, or bone destruction.   Soft tissues and spinal canal: Prevertebral soft tissues normal thickness. RIGHT thyroid nodule 3.5 x 2.6 cm; recommend follow-up non emergent thyroid US (ref: J Am Coll Radiol. 2015 Feb;12(2): 143-50). No additional abnormalities.   Disc levels:  Unremarkable  Upper chest: Lung apices clear   Other: N/A   IMPRESSION: Minimal small vessel chronic ischemic changes of deep cerebral white matter.   No acute intracranial abnormalities.   No acute cervical spine abnormalities.   RIGHT thyroid nodule 3.5 x 2.6 cm; follow-up non emergent thyroid ultrasound recommended as above.  PATIENT SURVEYS:  FOTO 26   COGNITION:  Overall cognitive status: Within functional limits for tasks assessed and anxious during eval   Figeting during eval states she has movement disorder    SENSATION: Numbness from right elbow to right hand; left hand numbness  POSTURE:  guarded  PALPATION: Tender all spinous processes Thoracic and lumbar spine  LUMBAR ROM:   Active  AROM  12/30/2021  Flexion 20%  Extension To neutral  Right lateral flexion   Left lateral flexion   Right rotation   Left rotation    (Blank rows = not tested) **unable to finish testing due to complaints of lightheadedness   LE MMT:   MMT Right 12/30/2021 Left 12/30/2021  Hip flexion 4 4-  Hip extension    Hip abduction    Hip adduction    Hip internal rotation    Hip external rotation    Knee flexion    Knee extension 4 4  Ankle dorsiflexion 4 4  Ankle plantarflexion    Ankle inversion    Ankle eversion     (Blank rows = not tested) Done in sitting due to patient compliant of lightheadness with standing  FUNCTIONAL TESTS:  30 seconds chair stand  test x 1 today (lightheadedness limits testing)  GAIT: Distance walked: 50 Assistive device utilized: None Level of assistance: CGA Comments: patient reports she is lightheaded; therapist gives her CGA for safety.      TODAY'S TREATMENT  Physical therapy evaluation, plan of care   BP 80/60 Right arm PATIENT EDUCATION:  Education details: plan of care Person educated: Patient Education method: Explanation Education comprehension: verbalized understanding and needs further education   HOME EXERCISE PROGRAM: Access Code: [email or web address] URL: [email or web address] Date: 12/30/2021 Prepared by: [facility] - Rehab  Exercises - Supine Lower Trunk Rotation  - 2 x daily - 7 x weekly - 1 sets - 10 reps - Supine Bridge  - 2 x daily - 7 x weekly - 1 sets - 10 reps - Supine Transversus Abdominis Bracing - Hands on Stomach  - 2 x daily - 7 x weekly - 1 sets - 10 reps  ASSESSMENT:  CLINICAL IMPRESSION: Patient is a 51  y.o. female who was seen today for physical therapy evaluation and treatment for back pain. She has ongoing compliant of being lightheaded today; increased with standing. Therapist took patient's blood pressure 80/60 sitting right arm.  Instructed her to call someone to pick her up to drive her home when symptoms do not resolve.  Discussed reasons to seek emergency attention. She states she has a migraine today as well; took medication prior to arrival. Patient is unable to tolerate standing evaluation tests; HEP instruction.  Will have to do further testing and HEP education next visit. We were able to complete her FOTO and perform some testing.  Patient's back pain and limited spinal mobility limit all her activities and ability to participate in home/community.  Patient will benefit from skilled PT interventions to address low back pain, decreased lumbar mobility and core weakness, LE weakness, transfer and gait training and the instruction, development and modification of  HEP.  OBJECTIVE IMPAIRMENTS Abnormal gait, decreased activity tolerance, decreased balance,  decreased endurance, decreased knowledge of condition, decreased knowledge of use of DME, decreased mobility, difficulty walking, decreased ROM, decreased strength, decreased safety awareness, dizziness, hypomobility, impaired flexibility, impaired sensation, and pain.   ACTIVITY LIMITATIONS cleaning, community activity, driving, meal prep, laundry, medication management, yard work, shopping, and school.   PERSONAL FACTORS Age, Behavior pattern, and Fitness are also affecting patient's functional outcome.    REHAB POTENTIAL: Good  CLINICAL DECISION MAKING: Stable/uncomplicated  EVALUATION COMPLEXITY: Low   GOALS: Goals reviewed with patient? No  SHORT TERM GOALS: Target date: 01/13/2022   patient will be independent with initial HEP  Baseline: Goal status: INITIAL  2.  Patient will improve lumbar AROM flexion to 50% and extension to 25% to improve functional mobility in home.  Baseline:  Goal status: INITIAL   LONG TERM GOALS: Target date: 01/27/2022   Patient will be independent with advanced HEP and self management strategies to improve quality of life and functional outcomes.  Baseline:  Goal status: INITIAL  2.   Patient will improve FOTO score to predicted value to demonstrate improved functional mobility. Baseline: 26 Goal status: INITIAL  3.  Patient will report at least 50% improvement in overall symptoms and/or function to demonstrate improved functional mobility  Baseline:  Goal status: INITIAL  4.  Patient will be able to demonstrate pain free lumbar motion in all tested directions 70% of range available Baseline: Painful flexion and extension  Goal status: INITIAL  5.   Active  AROM  12/30/2021  Flexion 20%  Extension To neutral  Right lateral flexion   Left lateral flexion   Right rotation   Left rotation    Baseline:  Goal status: INITIAL   PLAN: PT  FREQUENCY: 2x/week  PT DURATION: 4 weeks  PLANNED INTERVENTIONS: Therapeutic exercises, Therapeutic activity, Neuromuscular re-education, Balance training, Gait training, Patient/Family education, Joint manipulation, Joint mobilization, Stair training, Vestibular training, Canalith repositioning, Visual/preceptual remediation/compensation, Orthotic/Fit training, DME instructions, Aquatic Therapy, Dry Needling, Electrical stimulation, Spinal manipulation, Spinal mobilization, Cryotherapy, Moist heat, Taping, Traction, Ultrasound, and Manual therapy.  PLAN FOR NEXT SESSION: Review goals; instruct in HEP, finish MMT's and standing lumbar AROM sidebending and rotation measures.    2:56 PM, 12/30/21 Avanthika Dehnert Small Ayahna Solazzo MPT [facility] [place] [phone]

## 2021-12-30 ENCOUNTER — Ambulatory Visit (HOSPITAL_COMMUNITY): Payer: Self-pay | Attending: Orthopedic Surgery

## 2021-12-30 ENCOUNTER — Ambulatory Visit: Payer: Medicaid Other | Admitting: Licensed Clinical Social Worker

## 2021-12-30 DIAGNOSIS — M545 Low back pain, unspecified: Secondary | ICD-10-CM | POA: Insufficient documentation

## 2021-12-30 DIAGNOSIS — R2689 Other abnormalities of gait and mobility: Secondary | ICD-10-CM | POA: Insufficient documentation

## 2021-12-30 DIAGNOSIS — R262 Difficulty in walking, not elsewhere classified: Secondary | ICD-10-CM | POA: Insufficient documentation

## 2021-12-30 DIAGNOSIS — R29898 Other symptoms and signs involving the musculoskeletal system: Secondary | ICD-10-CM | POA: Insufficient documentation

## 2022-01-06 ENCOUNTER — Encounter: Payer: Self-pay | Admitting: Orthopedic Surgery

## 2022-01-06 ENCOUNTER — Ambulatory Visit: Payer: Medicaid Other | Admitting: Licensed Clinical Social Worker

## 2022-01-06 ENCOUNTER — Ambulatory Visit: Payer: Medicaid Other | Admitting: Orthopedic Surgery

## 2022-01-06 ENCOUNTER — Ambulatory Visit (INDEPENDENT_AMBULATORY_CARE_PROVIDER_SITE_OTHER): Payer: Self-pay | Admitting: Orthopedic Surgery

## 2022-01-06 VITALS — Ht 66.0 in | Wt 197.0 lb

## 2022-01-06 DIAGNOSIS — M25512 Pain in left shoulder: Secondary | ICD-10-CM

## 2022-01-06 DIAGNOSIS — G8929 Other chronic pain: Secondary | ICD-10-CM

## 2022-01-06 DIAGNOSIS — G5621 Lesion of ulnar nerve, right upper limb: Secondary | ICD-10-CM

## 2022-01-06 DIAGNOSIS — M25511 Pain in right shoulder: Secondary | ICD-10-CM

## 2022-01-06 MED ORDER — GABAPENTIN 300 MG PO CAPS: 600.0000 mg | ORAL_CAPSULE | Freq: Two times a day (BID) | ORAL | 0 refills | Status: DC

## 2022-01-06 NOTE — Patient Instructions (Signed)

## 2022-01-07 ENCOUNTER — Encounter: Payer: Self-pay | Admitting: Orthopedic Surgery

## 2022-01-07 ENCOUNTER — Ambulatory Visit (HOSPITAL_COMMUNITY): Payer: Self-pay

## 2022-01-07 ENCOUNTER — Encounter (HOSPITAL_COMMUNITY): Payer: Self-pay

## 2022-01-07 DIAGNOSIS — R2689 Other abnormalities of gait and mobility: Secondary | ICD-10-CM

## 2022-01-07 DIAGNOSIS — M545 Low back pain, unspecified: Secondary | ICD-10-CM

## 2022-01-07 DIAGNOSIS — R29898 Other symptoms and signs involving the musculoskeletal system: Secondary | ICD-10-CM

## 2022-01-07 DIAGNOSIS — R262 Difficulty in walking, not elsewhere classified: Secondary | ICD-10-CM

## 2022-01-07 NOTE — Progress Notes (Signed)
Orthopaedic Clinic Return ? ?Assessment: ?Briana Wyatt is a 51 y.o. female with the following: ?Chronic bilateral shoulder pain, left worse than right currently ?Right cubital tunnel syndrome ? ?Plan: ?Multiple complaints in clinic today.  Injection of the right shoulder has improved her pain,, but it is not resolved.  She is now starting to have left shoulder pain, and recent x-rays demonstrates likely impingement.  She is also having symptoms consistent with cubital tunnel syndrome.  These have not affected her strength.  She has tried using a brace, but this is not changing her symptoms.  As a result, I recommended an EMG for the right upper extremity.  She is also interested in a left shoulder injection.  This was completed in clinic today. ? ?Procedure note injection Left shoulder  ?  ?Verbal consent was obtained to inject the left shoulder, subacromial space ?Timeout was completed to confirm the site of injection.  The skin was prepped with alcohol and ethyl chloride was sprayed at the injection site.  ?A 21-gauge needle was used to inject 40 mg of Depo-Medrol and 1% lidocaine (3 cc) into the subacromial space of the left shoulder using a posterolateral approach.  ?There were no complications. A sterile bandage was applied. ? ? ?Meds ordered this encounter  ?Medications  ? gabapentin (NEURONTIN) 300 MG capsule  ?  Sig: Take 2 capsules (600 mg total) by mouth 2 (two) times daily.  ?  Dispense:  120 capsule  ?  Refill:  0  ? ? ?Body mass index is 31.8 kg/m?. ? ?Follow-up: ?Return for After EMG. ? ? ?Subjective: ? ?Chief Complaint  ?Patient presents with  ? Shoulder Pain  ?  Bilat shoulder pain. Has had shot in rt shoulder but having pains in left as well.   ? ? ?History of Present Illness: ?Briana Wyatt is a 51 y.o. female who returns to clinic for repeat evaluation of her right shoulder.  She states that the recent injection has improved her pain, and motion.  However, she does continue to have some  pain in the shoulder.  She is now starting to have issues with the left shoulder.  No prior surgeries in the left.  She has been evaluated by occupational therapy.  She had a splint made.  She has been trying to wear this on the right elbow, but this has been uncomfortable.  She continues to have numbness and tingling in the ulnar nerve distribution. ? ?Review of Systems: ?No fevers or chills ?+ numbness & tingling ?No chest pain ?No shortness of breath ?No bowel or bladder dysfunction ?No GI distress ?No headaches ? ? ?Objective: ?Ht '5\' 6"'$  (1.676 m)   Wt 197 lb (89.4 kg)   BMI 31.80 kg/m?  ? ?Physical Exam: ? ?Alert and oriented.  No acute distress. ? ?Right shoulder without deformity.  Active motion to 110 degrees of forward flexion.  Internal rotation to lumbar spine.  Positive Tinel's over the cubital tunnel.  Decree sensation to the small finger, as well as the ulnar aspect of the ring finger.  No muscle atrophy in the right hand. ? ?Left shoulder without deformity.  Tenderness to palpation over the posterior lateral aspect of the shoulder.  Pain in the empty can testing position.  Some pain with strength testing. ? ?IMAGING: ?I personally ordered and reviewed the following images: ? ?X-rays of the left shoulder were previously obtained.  No evidence of chronic injury.  Minimal degenerative changes.  There is some spurring of the lateral  and undersurface of the acromion. ? ? ?Mordecai Rasmussen, MD ?01/07/2022 ?9:25 AM ? ? ?

## 2022-01-07 NOTE — Therapy (Signed)
?OUTPATIENT PHYSICAL THERAPY THORACOLUMBAR EVALUATION ? ? ?Patient Name: Briana Wyatt ?MRN: [id number] ?DOB:11/27/1970, 51 y.o., female ?Today's Date: 01/07/2022 ? ? PT End of Session - 01/07/22 1300   ? ? Visit Number 2   ? Number of Visits 8   ? Date for PT Re-Evaluation 01/30/22   ? Authorization Type self pay   ? Progress Note Due on Visit 10   ? PT Start Time 1300   ? PT Stop Time 1340   ? PT Time Calculation (min) 40 min   ? ?  ?  ? ?  ? ? ?Past Medical History:  ?Diagnosis Date  ? Acid reflux   ? 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 Hypertension   ? Restless leg syndrome   ? Sciatica of right side   ? ?Past Surgical History:  ?Procedure Laterality Date  ? CARPAL TUNNEL RELEASE Bilateral   ? ECTOPIC PREGNANCY SURGERY    ? SHOULDER SURGERY Right   ? TUBAL LIGATION    ? ?Patient Active Problem List  ? Diagnosis Date Noted  ? 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 Cocaine-induced mood disorder ([place])   ? ? ?PCP: Soyla Dryer ? ?REFERRING PROVIDER: Deliah Goody, MD ? 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 Balance Issues-Per Deliah Goody MD  ? ?THERAPY DIAG:  ?Low back pain, unspecified back pain laterality, unspecified chronicity, unspecified whether sciatica present ? ?Difficulty in walking, not elsewhere classified ? ?Other symptoms and signs involving the musculoskeletal system ? ?Balance problem ? ?ONSET DATE: 2 weeks ago ? ?SUBJECTIVE:                                                                                                                                                                                          ? ?SUBJECTIVE STATEMENT: ?Patient reports back is a bit better, referred to neurosurgeon for follow up. Has gotten some sleep.  ?  ?PERTINENT HISTORY:  ?Seeing OT for M25.512 (ICD-10-CM) - Left shoulder pain, unspecified chronicity ?Movement disorder?  ?Migraines ? ?PAIN:   ?Are you having pain? Yes: NPRS scale: 8/10 ?Pain location: mid back to low back ?Pain description: aching, constant ?Aggravating factors: moving ?Relieving factors: nothing ? ? ?PRECAUTIONS: Fall ? ?WEIGHT BEARING RESTRICTIONS No ? ?FALLS:  ?Has patient fallen in last 6 months? Yes. Number of falls 4 ? ?LIVING ENVIRONMENT: ?Lives with: lives alone ?Lives in: Other modified [place]i ?Stairs: Yes: External: 2 steps; antique grab bar ?Has following equipment at home: Single point cane, Walker - 4 wheeled, shower chair, and Grab bars ? ?OCCUPATION: disability ? ?  PLOF: Independent with basic ADLs ? ?PATIENT GOALS less pain ? ? ?OBJECTIVE:  ? ?DIAGNOSTIC FINDINGS:  ?CLINICAL DATA:  Fell landing flat on back, back pain ?  ?EXAM: ?CT THORACIC AND LUMBAR SPINE WITHOUT CONTRAST ?  ?TECHNIQUE: ?Multidetector CT imaging of the thoracic and lumbar spine was ?performed without contrast. Multiplanar CT image reconstructions ?were also generated. ?  ?RADIATION DOSE REDUCTION: This exam was performed according to the ?departmental dose-optimization program which includes automated ?exposure control, adjustment of the mA and/or kV according to ?patient size and/or use of iterative reconstruction technique. ?  ?COMPARISON:  None ?  ?FINDINGS: ?CT THORACIC SPINE FINDINGS ?  ?Alignment: Normal ?  ?Vertebrae: Vertebral body heights maintained. Disc space heights ?fairly well maintained. Scattered endplate spur formation lower ?thoracic spine. No fracture, subluxation, or bone destruction. ?  ?Paraspinal and other soft tissues: Paraspinal soft tissues ?unremarkable. Normal adrenal glands. 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 Remaining ?paraspinal soft tissues unremarkable. ?  ?Disc levels: No obvious disc herniation or intraspinal mass ?  ?CT LUMBAR SPINE FINDINGS ?  ?Segmentation: Normal ?  ?Alignment: Normal ?  ?Vertebrae: Vertebral body  and disc space heights maintained. No ?fracture, subluxation, or bone destruction. SI joints unremarkable. ?  ?Paraspinal and other soft tissues: No additional abnormalities ?  ?Disc levels: Probable calcified disc fragment centrally at L4-L5. ?Bulging disc L3-L4. ?  ?IMPRESSION: ?CT THORACIC SPINE IMPRESSION ?  ?No acute thoracic spine abnormalities. ?  ?Small cluster of cysts with minimal soft tissue at LEFT renal fossa ?question multi-cystic dysplastic kidney. ?  ?CT LUMBAR SPINE IMPRESSION ?  ?No acute lumbar spine abnormalities. ?  ?Question did calcified disc fragment L4-L5. ? ?CLINICAL DATA:  Hit her head while getting out of and and slipped ?and fell landing on her back, back pain ?  ?EXAM: ?CT HEAD WITHOUT CONTRAST ?  ?CT CERVICAL SPINE WITHOUT CONTRAST ?  ?TECHNIQUE: ?Multidetector CT imaging of the head and cervical spine was ?performed following the standard protocol without intravenous ?contrast. Multiplanar CT image reconstructions of the cervical spine ?were also generated. ?  ?RADIATION DOSE REDUCTION: This exam was performed according to the ?departmental dose-optimization program which includes automated ?exposure control, adjustment of the mA and/or kV according to ?patient size and/or use of iterative reconstruction technique. ?  ?COMPARISON:  CT head 09/14/2020 ?  ?FINDINGS: ?CT HEAD FINDINGS ?  ?Brain: Normal ventricular morphology. No midline shift or mass ?effect. Minimal small vessel chronic ischemic changes of deep ?cerebral white matter. Otherwise normal appearance of brain ?parenchyma. No intracranial hemorrhage, mass lesion, or evidence of ?acute infarction. No extra-axial fluid collections. ?  ?Vascular: No hyperdense vessels. ?  ?Skull: Calvaria intact ?  ?Sinuses/Orbits: Clear ?  ?Other: N/A ?  ?CT CERVICAL SPINE FINDINGS ?  ?Alignment: Normal ?  ?Skull base and vertebrae: Osseous mineralization normal. Vertebral ?body and disc space heights maintained. No fracture, subluxation,  or ?bone destruction. ?  ?Soft tissues and spinal canal: Prevertebral soft tissues normal ?thickness. RIGHT thyroid nodule 3.5 x 2.6 cm; recommend follow-up ?non emergent thyroid US (ref: J Am Coll Radiol. 2015 Feb;12(2): ?143-50). No additional abnormalities. ?  ?Disc levels:  Unremarkable ?  ?Upper chest: Lung apices clear ?  ?Other: N/A ?  ?IMPRESSION: ?Minimal small vessel chronic ischemic changes of deep cerebral white ?matter. ?  ?No acute intracranial abnormalities. ?  ?No acute cervical spine abnormalities. ?  ?RIGHT thyroid nodule 3.5 x  2.6 cm; follow-up non emergent thyroid ?ultrasound recommended as above. ? ?PATIENT SURVEYS:  ?FOTO 26 ? ? ?COGNITION: ? Overall cognitive status: Within functional limits for tasks assessed and anxious during eval   Figeting during eval states she has movement disorder  ?  ?SENSATION: ?Numbness from right elbow to right hand; left hand numbness ? ?POSTURE:  ?guarded ? ?PALPATION: ?Tender all spinous processes Thoracic and lumbar spine ? ?LUMBAR ROM:  ? ?Active  AROM  ?01/07/2022  ?Flexion 20%  ?Extension To neutral  ?Right lateral flexion 15degrees  ?Left lateral flexion 24 degrees  ?Right rotation 15 degrees*  ?Left rotation 18 degrees  ? (Blank rows = not tested) ?**unable to finish testing due to complaints of lightheadedness ? ? ?LE MMT:  ? ?MMT Right ?01/07/2022 Left ?01/07/2022  ?Hip flexion 4 4-  ?Hip extension 4 3+ *  ?Hip abduction 4 3+* cam lesion /laberal tear   ?Hip adduction 4 4  ?Hip internal rotation    ?Hip external rotation    ?Knee flexion 4 4  ?Knee extension 4 4  ?Ankle dorsiflexion 4 4  ?Ankle plantarflexion    ?Ankle inversion    ?Ankle eversion    ? (Blank rows = not tested) ?Done in sitting due to patient compliant of lightheadness with standing ? ?FUNCTIONAL TESTS:  ?30 seconds chair stand test x 1 today (lightheadedness limits testing) ? ?GAIT: ?Distance walked: 50 ?Assistive device utilized: None ?Level of assistance: CGA ?Comments: patient reports  she is lightheaded; therapist gives her CGA for safety.   ? ? ? ?TODAY'S TREATMENT  ? ? 01/07/22 ? MMTof LE and Lumbar ROM ? Re review of TA contraction, bridge and LTR 2x10 each ? PB DKTC 2x10 ? SLKTC and h

## 2022-01-08 ENCOUNTER — Ambulatory Visit (HOSPITAL_COMMUNITY)
Admission: RE | Admit: 2022-01-08 | Discharge: 2022-01-08 | Disposition: A | Discharge status: Home / Self Care | Payer: Self-pay | Source: Ambulatory Visit | Attending: Internal Medicine | Admitting: Internal Medicine

## 2022-01-08 DIAGNOSIS — R931 Abnormal findings on diagnostic imaging of heart and coronary circulation: Secondary | ICD-10-CM | POA: Insufficient documentation

## 2022-01-08 MED ORDER — PERFLUTREN LIPID MICROSPHERE
1.0000 mL | INTRAVENOUS | Status: AC | PRN
  Administered 2022-01-08: 3 mL via INTRAVENOUS

## 2022-01-08 NOTE — Progress Notes (Signed)
*  PRELIMINARY RESULTS* Echocardiogram Limited 2-D Echocardiogram  has been performed with [facility].  Samuel Germany 01/08/2022, 1:39 PM

## 2022-01-09 ENCOUNTER — Ambulatory Visit (HOSPITAL_COMMUNITY): Payer: Self-pay

## 2022-01-09 ENCOUNTER — Encounter (HOSPITAL_COMMUNITY): Payer: Self-pay

## 2022-01-09 DIAGNOSIS — R262 Difficulty in walking, not elsewhere classified: Secondary | ICD-10-CM

## 2022-01-09 DIAGNOSIS — R2689 Other abnormalities of gait and mobility: Secondary | ICD-10-CM

## 2022-01-09 DIAGNOSIS — M545 Low back pain, unspecified: Secondary | ICD-10-CM

## 2022-01-09 DIAGNOSIS — R29898 Other symptoms and signs involving the musculoskeletal system: Secondary | ICD-10-CM

## 2022-01-09 NOTE — Therapy (Signed)
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 Balance Issues-Per Deliah Goody MD   THERAPY DIAG:  Low back pain, unspecified back pain laterality, unspecified chronicity, unspecified whether sciatica present  Difficulty in walking, not elsewhere classified  Other symptoms and signs involving the musculoskeletal system  Balance problem  ONSET DATE: 2 weeks ago  SUBJECTIVE:                                                                                                                                                                                           SUBJECTIVE STATEMENT: Patient reports back is feeling better, experiencing pain in right groin/anterior hip region, right shoulder pain this morning. Getting some help for pain from medication management. Patient reports that she did not get any rest last night.   Patient reports that she was able to use diaphragmatic breathing at home when pains were increasing and help for better breath control vs increasing respiration rate and chest breathing.    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 Yes: NPRS scale: 5/10 Pain location: mid back to low back Pain description: aching, constant Aggravating factors: moving Relieving factors: nothing   PRECAUTIONS: Fall  WEIGHT BEARING  RESTRICTIONS No  FALLS:  Has patient fallen in last 6 months? Yes. Number of falls 4  LIVING ENVIRONMENT: Lives with: lives alone Lives in: Other modified [place] Lei Stairs: Yes: External: 2 steps; antique grab bar Has following equipment at home: Single point cane, Walker - 4 wheeled, shower chair, and Grab bars  OCCUPATION: disability  PLOF: Independent with basic ADLs  PATIENT GOALS less pain   OBJECTIVE:   DIAGNOSTIC FINDINGS:  CLINICAL DATA:  Fell landing flat on back, back pain   EXAM: CT THORACIC AND LUMBAR SPINE WITHOUT CONTRAST   TECHNIQUE: Multidetector CT imaging of the thoracic and lumbar spine was performed without contrast. Multiplanar CT image reconstructions were also generated.   RADIATION DOSE REDUCTION: This exam was performed according to the departmental dose-optimization program which includes automated exposure control, adjustment of the mA and/or kV according to patient size and/or use of iterative reconstruction technique.   COMPARISON:  None   FINDINGS: CT THORACIC SPINE FINDINGS   Alignment: Normal   Vertebrae: Vertebral body heights maintained. Disc space heights fairly well maintained. Scattered endplate spur formation lower thoracic spine. No fracture, subluxation, or bone destruction.   Paraspinal and other soft tissues: Paraspinal soft tissues unremarkable. Normal  adrenal glands. Small cluster of cysts with minimal soft tissue at LEFT renal bed question multi-cystic dysplastic kidney, largest cyst 2.7 cm diameter. Remaining paraspinal soft tissues unremarkable.   Disc levels: No obvious disc herniation or intraspinal mass   CT LUMBAR SPINE FINDINGS   Segmentation: Normal   Alignment: Normal   Vertebrae: Vertebral body and disc space heights maintained. No fracture, subluxation, or bone destruction. SI joints unremarkable.   Paraspinal and other soft tissues: No additional abnormalities   Disc levels: Probable calcified disc fragment centrally at L4-L5. Bulging disc L3-L4.   IMPRESSION: CT THORACIC SPINE IMPRESSION   No acute thoracic spine abnormalities.   Small cluster of cysts with minimal soft tissue at LEFT renal fossa question multi-cystic dysplastic kidney.   CT LUMBAR SPINE IMPRESSION   No acute lumbar spine abnormalities.   Question did calcified disc fragment L4-L5.  CLINICAL DATA:  Hit her head while getting out of and and slipped and fell landing on her back, back pain   EXAM: CT HEAD WITHOUT CONTRAST   CT CERVICAL SPINE WITHOUT CONTRAST   TECHNIQUE: Multidetector CT imaging of the head and cervical spine was performed following the standard protocol without intravenous contrast. Multiplanar CT image reconstructions of the cervical spine were also generated.   RADIATION DOSE REDUCTION: This exam was performed according to the departmental dose-optimization program which includes automated exposure control, adjustment of the mA and/or kV according to patient size and/or use of iterative reconstruction technique.   COMPARISON:  CT head 09/14/2020   FINDINGS: CT HEAD FINDINGS   Brain: Normal ventricular morphology. No midline shift or mass effect. Minimal small vessel chronic ischemic changes of deep cerebral white matter. Otherwise normal appearance of brain parenchyma. No intracranial hemorrhage, mass  lesion, or evidence of acute infarction. No extra-axial fluid collections.   Vascular: No hyperdense vessels.   Skull: Calvaria intact   Sinuses/Orbits: Clear   Other: N/A   CT CERVICAL SPINE FINDINGS   Alignment: Normal   Skull base and vertebrae: Osseous mineralization normal. Vertebral body and disc space heights maintained. No fracture, subluxation, or bone destruction.   Soft tissues and spinal canal: Prevertebral soft tissues normal thickness. RIGHT thyroid nodule 3.5 x 2.6 cm; recommend follow-up non emergent thyroid US (ref: J Am Coll  Radiol. 2015 Feb;12(2): 143-50). No additional abnormalities.   Disc levels:  Unremarkable   Upper chest: Lung apices clear   Other: N/A   IMPRESSION: Minimal small vessel chronic ischemic changes of deep cerebral white matter.   No acute intracranial abnormalities.   No acute cervical spine abnormalities.   RIGHT thyroid nodule 3.5 x 2.6 cm; follow-up non emergent thyroid ultrasound recommended as above.  PATIENT SURVEYS:  FOTO 26   COGNITION:  Overall cognitive status: Within functional limits for tasks assessed and anxious during eval   Figeting during eval states she has movement disorder    SENSATION: Numbness from right elbow to right hand; left hand numbness  POSTURE:  guarded  PALPATION: Tender all spinous processes Thoracic and lumbar spine  LUMBAR ROM:   Active  AROM  01/07/2022  Flexion 20%  Extension To neutral  Right lateral flexion 15degrees  Left lateral flexion 24 degrees  Right rotation 15 degrees*  Left rotation 18 degrees   (Blank rows = not tested) **unable to finish testing due to complaints of lightheadedness   LE MMT:   MMT Right 01/07/2022 Left 01/07/2022  Hip flexion 4 4-  Hip extension 4 3+ *  Hip abduction 4 3+* cam lesion /laberal tear   Hip adduction 4 4  Hip internal rotation    Hip external rotation    Knee flexion 4 4  Knee extension 4 4  Ankle dorsiflexion 4 4   Ankle plantarflexion    Ankle inversion    Ankle eversion     (Blank rows = not tested) Done in sitting due to patient compliant of lightheadness with standing  FUNCTIONAL TESTS:  30 seconds chair stand test x 1 today (lightheadedness limits testing) 01/09/22 9 sit to stands in 30 seconds   GAIT: Distance walked: 50 Assistive device utilized: None Level of assistance: CGA Comments: patient reports she is lightheaded; therapist gives her CGA for safety.      TODAY'S TREATMENT    01/09/22  Assessed five time sit to stand  Decompression exercises 1-5 3 sec holds x10 reps  Hamstring stretch with strap 10-15 second holds x4 b/l  PB LTR x40  Supine marches x40  Seated PB rolls anterior with seated trunk extensions  oncoming back up x3 mins Horizontal ER with red tband x10 Rows red tband 2x10  Pulley UE x2 mins STM Thoracic lumbar region x15 mins      01/07/22  MMTof LE and Lumbar ROM  Re review of TA contraction, bridge and LTR 2x10 each  PB DKTC 2x10  SLKTC and hold with use of towel for assistance to hold on left x10 sec hold x3 reps  Right hip manual distractionx 2 mins  PB seated ball roll anterior x10 reps  Standing lat pull down red theraband 2x10  STM Thoracic lumbar region x15 mins Diaphragmatic breathing  Physical therapy evaluation, plan of care   BP 80/60 Right arm PATIENT EDUCATION:  Education details: updated HEP Person educated: Patient Education method: Explanation Education comprehension: verbalized understanding and needs further education   HOME EXERCISE PROGRAM:  01/09/22  Decompression exercises 1-5    Re- Reviewed  Access Code: [email or web address] URL: [email or web address] Date: 12/30/2021 Prepared by: [facility] - Rehab  Exercises - Supine Lower Trunk Rotation  - 2 x daily - 7 x weekly - 1 sets - 10 reps - Supine Bridge  - 2 x daily - 7 x weekly - 1 sets - 10 reps - Supine Transversus Abdominis Bracing - Hands on  Stomach  - 2 x daily - 7 x  weekly - 1 sets - 10 reps  ASSESSMENT:  CLINICAL IMPRESSION: Patient desensitizing in thoracic region with STM today as compared to last session. Swelling at T8 region along right paraspinals decreased but still present at end of STM. Patient reports that PB forward rolls inc pain to 7/10 today. Patient demonstrates more tolerance to exercises and mobility work today overall. Patient leaves session feeling more relaxed and somewhat looser. Pt will continue to benefit from skilled PT services to improve mobility, strength and work towards pain reduction.   OBJECTIVE IMPAIRMENTS Abnormal gait, decreased activity tolerance, decreased balance, decreased endurance, decreased knowledge of condition, decreased knowledge of use of DME, decreased mobility, difficulty walking, decreased ROM, decreased strength, decreased safety awareness, dizziness, hypomobility, impaired flexibility, impaired sensation, and pain.   ACTIVITY LIMITATIONS cleaning, community activity, driving, meal prep, laundry, medication management, yard work, shopping, and school.   PERSONAL FACTORS Age, Behavior pattern, and Fitness are also affecting patient's functional outcome.    REHAB POTENTIAL: Good  CLINICAL DECISION MAKING: Stable/uncomplicated  EVALUATION COMPLEXITY: Low   GOALS: Goals reviewed with patient? No  SHORT TERM GOALS: Target date: 01/23/2022   patient will be independent with initial HEP  Baseline: Goal status: INITIAL  2.  Patient will improve lumbar AROM flexion to 50% and extension to 25% to improve functional mobility in home.  Baseline:  Goal status: INITIAL   LONG TERM GOALS: Target date: 02/06/2022   Patient will be independent with advanced HEP and self management strategies to improve quality of life and functional outcomes.  Baseline:  Goal status: INITIAL  2.   Patient will improve FOTO score to predicted value to demonstrate improved functional mobility. Baseline: 26 Goal status:  INITIAL  3.  Patient will report at least 50% improvement in overall symptoms and/or function to demonstrate improved functional mobility  Baseline:  Goal status: INITIAL  4.  Patient will be able to demonstrate pain free lumbar motion in all tested directions 70% of range available Baseline: Painful flexion and extension  Goal status: INITIAL  5.   Active  AROM  12/30/2021  Flexion 20%  Extension To neutral  Right lateral flexion   Left lateral flexion   Right rotation   Left rotation    Baseline:  Goal status: INITIAL   PLAN: PT FREQUENCY: 2x/week  PT DURATION: 4 weeks  PLANNED INTERVENTIONS: Therapeutic exercises, Therapeutic activity, Neuromuscular re-education, Balance training, Gait training, Patient/Family education, Joint manipulation, Joint mobilization, Stair training, Vestibular training, Canalith repositioning, Visual/preceptual remediation/compensation, Orthotic/Fit training, DME instructions, Aquatic Therapy, Dry Needling, Electrical stimulation, Spinal manipulation, Spinal mobilization, Cryotherapy, Moist heat, Taping, Traction, Ultrasound, and Manual therapy.  PLAN FOR NEXT SESSION: continue to work on spine mobility and core strenghtening exercises. Try to incorporate more standing exercises if tolerated.   12:15 PM, 01/09/22 Breshae Belcher PT, DPT

## 2022-01-12 ENCOUNTER — Telehealth: Payer: Self-pay

## 2022-01-12 DIAGNOSIS — I2510 Atherosclerotic heart disease of native coronary artery without angina pectoris: Secondary | ICD-10-CM

## 2022-01-12 MED ORDER — METOPROLOL TARTRATE 100 MG PO TABS: ORAL_TABLET | ORAL | 0 refills | Status: DC

## 2022-01-12 NOTE — Telephone Encounter (Signed)
Patient notified and verbalized understanding. Pt agreeable with CT scan. Pt had no questions or concerns at this time.

## 2022-01-13 ENCOUNTER — Ambulatory Visit: Payer: Medicaid Other | Admitting: Licensed Clinical Social Worker

## 2022-01-14 ENCOUNTER — Telehealth: Payer: Self-pay

## 2022-01-14 NOTE — Telephone Encounter (Signed)
Called for follow up with client. She had her first televisit with [place]. She states that visit went well. She states she plans or would like to continue to meet with [facility] as she states she formed a good relationship with him.  She plans to continue medical services with [facility].  Client shares more stress at this time with more health tests and and specialist referrals. She is currently in outpatient physical therapy. She does have [place] [facility] in place currently.  She shares that her situation with her mother is still difficult and strained and she is currently living in a "mold infested camper" She admits to more thoughts of SI and states she shared that with [facility] provider on 01/13/22. She denies an active Plan and [facility] provider did develop a safety plan and client states "I know to call 911 if I get worse or have a plan to follow through on my thoughts of suicide" she shares that she plans on being committed for mental health treatment this week, she is just wanting to "get some of my things outside arranged before going in"  Client expresses no other needs at this time.  Plan: will attempt to call and follow up with client tomorrow as well as refer her to our summer MSW intern for follow up and to discuss further housing options and potential other referrals. Client is agreeable. She states if intern does not reach her next week she may be "admitted".  Possible referral to Integrated health care in near future, will plan to discuss with client during next follow up call.   [name]

## 2022-01-15 ENCOUNTER — Encounter (HOSPITAL_COMMUNITY): Payer: Self-pay

## 2022-01-15 ENCOUNTER — Ambulatory Visit (HOSPITAL_COMMUNITY): Payer: Self-pay

## 2022-01-15 DIAGNOSIS — M545 Low back pain, unspecified: Secondary | ICD-10-CM

## 2022-01-15 DIAGNOSIS — R262 Difficulty in walking, not elsewhere classified: Secondary | ICD-10-CM

## 2022-01-15 DIAGNOSIS — R29898 Other symptoms and signs involving the musculoskeletal system: Secondary | ICD-10-CM

## 2022-01-15 NOTE — Therapy (Signed)
OUTPATIENT PHYSICAL THERAPY THORACOLUMBAR TREATMENT   Patient Name: Briana Wyatt MRN: [id number] DOB:1971-02-17, 51 y.o., female Today's Date: 01/15/2022   PT End of Session - 01/15/22 1404     Visit Number 4    Number of Visits 8    Date for PT Re-Evaluation 01/30/22    Authorization Type self pay    Progress Note Due on Visit 10    PT Start Time 1323   late arrival   PT Stop Time 1402    PT Time Calculation (min) 39 min    Activity Tolerance Patient tolerated treatment well    Behavior During Therapy [facility] for tasks assessed/performed              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 Balance Issues-Per Deliah Goody MD   THERAPY DIAG:  Low back pain, unspecified back pain laterality, unspecified chronicity, unspecified whether sciatica present  Difficulty in walking, not elsewhere classified  Other symptoms and signs involving the musculoskeletal system  ONSET DATE: 2 weeks ago  SUBJECTIVE:                                                                                                                                                                                           SUBJECTIVE STATEMENT: Pt c/o pain in thoracic and lumbar region, pain scale 7/10.  Reports she is beginning to have migraine today.   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 Yes: NPRS scale: 7/10 Pain location: mid back to low back Pain description: sharp pain Aggravating factors: moving Relieving factors: nothing   PRECAUTIONS: Fall  WEIGHT BEARING RESTRICTIONS No  FALLS:  Has patient fallen in last 6 months? Yes. Number of falls 4  LIVING ENVIRONMENT: Lives with: lives alone Lives in: Other modified  Lucianne Lei Stairs: Yes: External: 2 steps; antique grab bar Has following equipment at home: Single point cane, Walker - 4 wheeled, shower chair, and Grab bars  OCCUPATION: disability  PLOF: Independent with basic ADLs  PATIENT GOALS less pain   OBJECTIVE:   DIAGNOSTIC FINDINGS:  CLINICAL DATA:  Fell landing flat on back, back pain   EXAM: CT THORACIC AND LUMBAR SPINE WITHOUT CONTRAST   TECHNIQUE: Multidetector CT imaging of the thoracic and lumbar spine was performed without contrast. Multiplanar CT image reconstructions were also generated.   RADIATION DOSE REDUCTION: This exam was performed according to the departmental dose-optimization program which includes automated exposure control, adjustment of the mA and/or kV according to patient size and/or use of iterative reconstruction technique.   COMPARISON:  None   FINDINGS: CT THORACIC SPINE FINDINGS   Alignment: Normal   Vertebrae: Vertebral body heights maintained. Disc space heights fairly well maintained. Scattered endplate spur formation lower thoracic spine. No fracture, subluxation, or bone destruction.   Paraspinal and other soft tissues: Paraspinal soft tissues unremarkable. Normal adrenal glands. Small cluster of cysts with minimal soft tissue at LEFT renal bed question multi-cystic dysplastic kidney, largest cyst 2.7 cm diameter. Remaining paraspinal soft tissues unremarkable.   Disc  levels: No obvious disc herniation or intraspinal mass   CT LUMBAR SPINE FINDINGS   Segmentation: Normal   Alignment: Normal   Vertebrae: Vertebral body and disc space heights maintained. No fracture, subluxation, or bone destruction. SI joints unremarkable.   Paraspinal and other soft tissues: No additional abnormalities   Disc levels: Probable calcified disc fragment centrally at L4-L5. Bulging disc L3-L4.   IMPRESSION: CT THORACIC SPINE IMPRESSION   No acute thoracic spine abnormalities.   Small cluster of cysts with minimal soft tissue at LEFT renal fossa question multi-cystic dysplastic kidney.   CT LUMBAR SPINE IMPRESSION   No acute lumbar spine abnormalities.   Question did calcified disc fragment L4-L5.  CLINICAL DATA:  Hit her head while getting out of and and slipped and fell landing on her back, back pain   EXAM: CT HEAD WITHOUT CONTRAST   CT CERVICAL SPINE WITHOUT CONTRAST   TECHNIQUE: Multidetector CT imaging of the head and cervical spine was performed following the standard protocol without intravenous contrast. Multiplanar CT image reconstructions of the cervical spine were also generated.   RADIATION DOSE REDUCTION: This exam was performed according to the departmental dose-optimization program which includes automated exposure control, adjustment of the mA and/or kV according to patient size and/or use of iterative reconstruction technique.   COMPARISON:  CT head 09/14/2020   FINDINGS: CT HEAD FINDINGS   Brain: Normal ventricular morphology. No midline shift or mass effect. Minimal small vessel chronic ischemic changes of deep cerebral white matter. Otherwise normal appearance of brain parenchyma. No intracranial hemorrhage, mass lesion, or evidence of acute infarction. No extra-axial fluid collections.   Vascular: No hyperdense vessels.   Skull: Calvaria intact   Sinuses/Orbits: Clear   Other: N/A   CT CERVICAL SPINE FINDINGS    Alignment: Normal   Skull base and vertebrae: Osseous mineralization normal. Vertebral body and disc space heights maintained. No fracture, subluxation, or bone destruction.   Soft tissues and spinal canal: Prevertebral soft tissues normal thickness. RIGHT thyroid nodule 3.5 x 2.6 cm; recommend follow-up non emergent thyroid US (ref: J Am Coll Radiol. 2015 Feb;12(2): 143-50). No additional abnormalities.   Disc levels:  Unremarkable   Upper chest: Lung apices clear   Other: N/A   IMPRESSION: Minimal  small vessel chronic ischemic changes of deep cerebral white matter.   No acute intracranial abnormalities.   No acute cervical spine abnormalities.   RIGHT thyroid nodule 3.5 x 2.6 cm; follow-up non emergent thyroid ultrasound recommended as above.  PATIENT SURVEYS:  FOTO 26   COGNITION:  Overall cognitive status: Within functional limits for tasks assessed and anxious during eval   Figeting during eval states she has movement disorder    SENSATION: Numbness from right elbow to right hand; left hand numbness  POSTURE:  guarded  PALPATION: Tender all spinous processes Thoracic and lumbar spine  LUMBAR ROM:   Active  AROM  01/07/2022  Flexion 20%  Extension To neutral  Right lateral flexion 15degrees  Left lateral flexion 24 degrees  Right rotation 15 degrees*  Left rotation 18 degrees   (Blank rows = not tested) **unable to finish testing due to complaints of lightheadedness   LE MMT:   MMT Right 01/07/2022 Left 01/07/2022  Hip flexion 4 4-  Hip extension 4 3+ *  Hip abduction 4 3+* cam lesion /laberal tear   Hip adduction 4 4  Hip internal rotation    Hip external rotation    Knee flexion 4 4  Knee extension 4 4  Ankle dorsiflexion 4 4  Ankle plantarflexion    Ankle inversion    Ankle eversion     (Blank rows = not tested) Done in sitting due to patient compliant of lightheadness with standing  FUNCTIONAL TESTS:  30 seconds chair stand test x  1 today (lightheadedness limits testing) 01/09/22 9 sit to stands in 30 seconds   GAIT: Distance walked: 50 Assistive device utilized: None Level of assistance: CGA Comments: patient reports she is lightheaded; therapist gives her CGA for safety.      TODAY'S TREATMENT   01/15/22:  Standing: Postural awareness  Chin tuck against wall 5x 3"  Wback 7x   Supine:  Decompression 1-4 with RTB 10 reps  STM Thoracic lumbar prone   01/09/22  Assessed five time sit to stand  Decompression exercises 1-5 3 sec holds x10 reps  Hamstring stretch with strap 10-15 second holds x4 b/l  PB LTR x40  Supine marches x40  Seated PB rolls anterior with seated trunk extensions  oncoming back up x3 mins Horizontal ER with red tband x10 Rows red tband 2x10  Pulley UE x2 mins STM Thoracic lumbar region x15 mins      01/07/22  MMTof LE and Lumbar ROM  Re review of TA contraction, bridge and LTR 2x10 each  PB DKTC 2x10  SLKTC and hold with use of towel for assistance to hold on left x10 sec hold x3 reps  Right hip manual distractionx 2 mins  PB seated ball roll anterior x10 reps  Standing lat pull down red theraband 2x10  STM Thoracic lumbar region x15 mins Diaphragmatic breathing  Physical therapy evaluation, plan of care   BP 80/60 Right arm PATIENT EDUCATION:  Education details: updated HEP Person educated: Patient Education method: Explanation Education comprehension: verbalized understanding and needs further education   HOME EXERCISE PROGRAM:  01/09/22  Decompression exercises 1-5    Re- Reviewed  Access Code: [email or web address] URL: [email or web address] Date: 12/30/2021 Prepared by: [facility] - Rehab  Exercises - Supine Lower Trunk Rotation  - 2 x daily - 7 x weekly - 1 sets - 10 reps - Supine Bridge  - 2 x daily - 7 x weekly - 1 sets - 10 reps - Supine Transversus Abdominis Bracing -  Hands on Stomach  - 2 x daily - 7 x weekly - 1 sets - 10 reps  ASSESSMENT:  CLINICAL  IMPRESSION: Pt educated importance of posture for pain control. Added standing postural strengthening exercises with some cueing to improve cervical retraction and reduce extension of neck.  Progressed to RTB decompression exercises that was tolerated well.  Pt did c/o occasional pain in shoulder, neck, numbness ring and pinky finger, and hip pain that was monitored through session.  EOS with manual STM to address restrictions, noted edema present around Rt side on T8 region.  Reports of some relief following manual.  OBJECTIVE IMPAIRMENTS Abnormal gait, decreased activity tolerance, decreased balance, decreased endurance, decreased knowledge of condition, decreased knowledge of use of DME, decreased mobility, difficulty walking, decreased ROM, decreased strength, decreased safety awareness, dizziness, hypomobility, impaired flexibility, impaired sensation, and pain.   ACTIVITY LIMITATIONS cleaning, community activity, driving, meal prep, laundry, medication management, yard work, shopping, and school.   PERSONAL FACTORS Age, Behavior pattern, and Fitness are also affecting patient's functional outcome.    REHAB POTENTIAL: Good  CLINICAL DECISION MAKING: Stable/uncomplicated  EVALUATION COMPLEXITY: Low   GOALS: Goals reviewed with patient? No  SHORT TERM GOALS: Target date: 01/29/2022   patient will be independent with initial HEP  Baseline: Goal status: Ongoing  2.  Patient will improve lumbar AROM flexion to 50% and extension to 25% to improve functional mobility in home.  Baseline:  Goal status: Ongoing   LONG TERM GOALS: Target date: 02/12/2022   Patient will be independent with advanced HEP and self management strategies to improve quality of life and functional outcomes.  Baseline:  Goal status: Ongoing  2.   Patient will improve FOTO score to predicted value to demonstrate improved functional mobility. Baseline: 26 Goal status: Ongoing  3.  Patient will report at least  50% improvement in overall symptoms and/or function to demonstrate improved functional mobility  Baseline:  Goal status: Ongoing  4.  Patient will be able to demonstrate pain free lumbar motion in all tested directions 70% of range available Baseline: Painful flexion and extension  Goal status: Ongoing  5.   Active  AROM  12/30/2021  Flexion 20%  Extension To neutral  Right lateral flexion   Left lateral flexion   Right rotation   Left rotation    Baseline:  Goal status: Ongoing   PLAN: PT FREQUENCY: 2x/week  PT DURATION: 4 weeks  PLANNED INTERVENTIONS: Therapeutic exercises, Therapeutic activity, Neuromuscular re-education, Balance training, Gait training, Patient/Family education, Joint manipulation, Joint mobilization, Stair training, Vestibular training, Canalith repositioning, Visual/preceptual remediation/compensation, Orthotic/Fit training, DME instructions, Aquatic Therapy, Dry Needling, Electrical stimulation, Spinal manipulation, Spinal mobilization, Cryotherapy, Moist heat, Taping, Traction, Ultrasound, and Manual therapy.  PLAN FOR NEXT SESSION: continue to work on spine mobility and core strenghtening exercises. Try to incorporate more standing exercises if tolerated.  Ihor Austin, LPTA/CLT; Delana Meyer [phone] 5:58 PM, 01/15/22

## 2022-01-16 ENCOUNTER — Other Ambulatory Visit: Payer: Self-pay | Admitting: Physician Assistant

## 2022-01-16 ENCOUNTER — Ambulatory Visit (HOSPITAL_COMMUNITY)
Admission: EM | Admit: 2022-01-16 | Discharge: 2022-01-18 | Disposition: A | Discharge status: Home / Self Care | Payer: No Payment, Other | Attending: Psychiatry | Admitting: Psychiatry

## 2022-01-16 ENCOUNTER — Other Ambulatory Visit: Payer: Self-pay

## 2022-01-16 DIAGNOSIS — R45851 Suicidal ideations: Secondary | ICD-10-CM | POA: Diagnosis not present

## 2022-01-16 DIAGNOSIS — F332 Major depressive disorder, recurrent severe without psychotic features: Secondary | ICD-10-CM | POA: Diagnosis not present

## 2022-01-16 DIAGNOSIS — Z20822 Contact with and (suspected) exposure to covid-19: Secondary | ICD-10-CM | POA: Diagnosis not present

## 2022-01-16 LAB — POCT URINE DRUG SCREEN - MANUAL ENTRY (I-SCREEN)
POC Amphetamine UR: NOT DETECTED
POC Buprenorphine (BUP): NOT DETECTED
POC Cocaine UR: NOT DETECTED
POC Marijuana UR: NOT DETECTED
POC Methadone UR: NOT DETECTED
POC Methamphetamine UR: NOT DETECTED
POC Morphine: NOT DETECTED
POC Oxazepam (BZO): NOT DETECTED
POC Oxycodone UR: NOT DETECTED
POC Secobarbital (BAR): NOT DETECTED

## 2022-01-16 LAB — POC SARS CORONAVIRUS 2 AG: SARSCOV2ONAVIRUS 2 AG: NEGATIVE

## 2022-01-16 MED ORDER — NICOTINE 21 MG/24HR TD PT24
21.0000 mg | MEDICATED_PATCH | Freq: Every day | TRANSDERMAL | Status: DC
  Filled 2022-01-16: qty 1

## 2022-01-16 MED ORDER — ALUM & MAG HYDROXIDE-SIMETH 200-200-20 MG/5ML PO SUSP: 30.0000 mL | ORAL | Status: DC | PRN

## 2022-01-16 MED ORDER — HYDROXYZINE HCL 25 MG PO TABS
25.0000 mg | ORAL_TABLET | Freq: Three times a day (TID) | ORAL | Status: DC | PRN
  Administered 2022-01-17 (×2): 25 mg via ORAL
  Filled 2022-01-16 (×2): qty 1

## 2022-01-16 MED ORDER — DULOXETINE HCL 30 MG PO CPEP
30.0000 mg | ORAL_CAPSULE | Freq: Once | ORAL | Status: AC
  Administered 2022-01-17: 30 mg via ORAL
  Filled 2022-01-16: qty 1

## 2022-01-16 MED ORDER — ACETAMINOPHEN 325 MG PO TABS
650.0000 mg | ORAL_TABLET | Freq: Four times a day (QID) | ORAL | Status: DC | PRN
  Administered 2022-01-17: 650 mg via ORAL
  Filled 2022-01-16: qty 2

## 2022-01-16 MED ORDER — MAGNESIUM HYDROXIDE 400 MG/5ML PO SUSP: 30.0000 mL | Freq: Every day | ORAL | Status: DC | PRN
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
## 2022-01-16 NOTE — ED Provider Notes (Signed)
[facility] Continuous Assessment Admission H&P  Date: 01/16/22 Patient Name: Briana Wyatt MRN: [id number] Chief Complaint:  Chief Complaint  Patient presents with   Suicidal      Diagnoses:  Final diagnoses:  Severe episode of recurrent major depressive disorder, without psychotic features ([place])  Suicidal ideation    HPI:  Briana Wyatt is a 51 year old female with a past psychiatric history of major depressive disorder and crack cocaine use disorder.  She presents today to the [facility] behavioral health urgent care reporting suicidal thoughts with a plan.  The patient is found holding her head, crying.  She exhibits a linear and logical thought process with an anxious and depressed affect.  She reports that she came to the behavioral health urgent care today because she knew she would kill herself over the weekend.  She reports having a plan to run tubing from the exhaust pipe of her car into one of the windows to commit suicide.  She denies any previous history of suicide attempts or self-injurious behavior.  She denies access to firearms at home.  She reports that she has religious beliefs against suicide.  She states that she has multiple children but they are above the age of 27.  The patient reports "I've been depressed my whole life" and explains this further by saying "I live in a camper behind my mom's house and go to the bathroom in the woods".  She reports a previous hospitalization at [facility] for suicidal ideation several years ago.  The patient reports that she has no outstanding legal charges and states that she can go back to living in the camper behind her mother's house.  She denies experiencing any homicidal thoughts.  She reports that she occasionally "hears people outside of the camper" but denies auditory visual hallucinations.  Bipolar screening is negative for any history of mania.  The patient reports a substantial history of crack cocaine use beginning  in 2004.  She states that for the past year she has been clean.  She denies any alcohol use or illegal drug use recently.  She reports that she has recently been to see a psychiatric provider in [facility].  She states that she has not started on any medications states she has not taken any psychiatric medication for months.  Per chart review, the patient was admitted to the observation unit at the [facility] behavioral health urgent care for 1 day in January 2022.  At that time the patient was having problems with her mother and presented with suicidal thoughts with a plan.  A few days previously the patient had presented to the emergency department with an episode of what appears to have been cocaine induced psychotic behavior.    PHQ 2-9:  Flowsheet Row ED from 09/14/2020 in [place]  PHQ-9 Total Score [place] ED from 01/16/2022 in [facility] ED from 12/16/2021 in [place] ED from 09/07/2021 in [place] High Risk No Risk No Risk        Total Time spent with patient: 30 minutes  Musculoskeletal  Strength & Muscle Tone: within normal limits Gait & Station: normal Patient leans: N/A  Psychiatric Specialty Exam  Presentation General Appearance: Disheveled  Eye Contact:Poor  Speech:Clear and Coherent  Speech Volume:Normal  Handedness:No data recorded  Mood and Affect  Mood:Depressed; Anxious; Irritable  Affect:Appropriate; Congruent   Thought  Process  Thought Processes:Coherent  Descriptions of Associations:Intact  Orientation:Full (Time, Place and Person)  Thought Content:Logical  Diagnosis of Schizophrenia or Schizoaffective disorder in past: No data recorded Duration of Psychotic Symptoms: No data recorded Hallucinations:Hallucinations: None  Ideas of Reference:None  Suicidal Thoughts:Suicidal Thoughts: Yes,  Active SI Active Intent and/or Plan: With Intent; With Plan; With Means to [place]  Homicidal Thoughts:Homicidal Thoughts: No   Sensorium  Memory:Immediate Fair; Recent Fair; Remote Fair  Judgment:Poor  Insight:Poor   Executive Functions  Concentration:Fair  Attention Span:Fair  [place]   Psychomotor Activity  Psychomotor Activity:Psychomotor Activity: Normal   Assets  Assets:Housing   Sleep  Sleep:Sleep: Poor   Physical Exam Constitutional:      Appearance: He is not toxic-appearing.  Pulmonary:     Effort: Pulmonary effort is normal.  Neurological:     General: No focal deficit present.     Mental Status: He is alert and oriented to person, place, and time.   Review of Systems  Respiratory:  Negative for shortness of breath.   Cardiovascular:  Negative for chest pain.  Gastrointestinal:  Negative for abdominal pain, constipation, diarrhea, nausea and vomiting.  Neurological:  Positive for headaches and neck pain (acute on chronic in nature). Positive for L hand numbness (chronic in nature)   Blood pressure (!) 164/103, pulse 89, temperature 98.1 F (36.7 C), temperature source Oral, resp. rate 18, SpO2 99 %. There is no height or weight on file to calculate BMI.  Past Psychiatric History: MDD, cocaine use disorder   Is the patient at risk to self? Yes  Has the patient been a risk to self in the past 6 months? Yes .    Has the patient been a risk to self within the distant past? Yes   Is the patient a risk to others? No   Has the patient been a risk to others in the past 6 months? No   Has the patient been a risk to others within the distant past? 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   Hypertension    Restless leg syndrome    Sciatica of right side     Past Surgical  History:  Procedure Laterality Date   CARPAL TUNNEL RELEASE Bilateral    ECTOPIC PREGNANCY SURGERY     SHOULDER SURGERY Right    TUBAL LIGATION      Family History:  Family History  Problem Relation Age of Onset   Hypertension Father    Stroke Father     Social History:  Social History   Socioeconomic History   Marital status: Single    Spouse name: Not on file   Number of children: 3   Years of education: Not on file   Highest education level: GED or equivalent  Occupational History   Not on file  Tobacco Use   Smoking status: Former    Types: Cigarettes    Quit date: 08/25/1995    Years since quitting: 26.4   Smokeless tobacco: Never  Vaping Use   Vaping Use: Never used  Substance and Sexual Activity   Alcohol use: No   Drug use: Not Currently    Types: Marijuana, Cocaine    Comment: denies use 10/02/21   Sexual activity: Not Currently    Birth control/protection: Surgical  Other Topics  Concern   Not on file  Social History Narrative   Not on file   Social Determinants of Health   Financial Resource Strain: Not on file  Food Insecurity: No Food Insecurity   Worried About Running Out of Food in the Last Year: Never true   Ran Out of Food in the Last Year: Never true  Transportation Needs: No Transportation Needs   Lack of Transportation (Medical): No   Lack of Transportation (Non-Medical): No  Physical Activity: Not on file  Stress: Not on file  Social Connections: Not on file  Intimate Partner Violence: Not on file    SDOH:  SDOH Screenings   Alcohol Screen: Not on file  Depression (PHQ2-9): Not on file  Financial Resource Strain: Not on file  Food Insecurity: No Food Insecurity   Worried About Running Out of Food in the Last Year: Never true   Ran Out of Food in the Last Year: Never true  Housing: Not on file  Physical Activity: Not on file  Social Connections: Not on file  Stress: Not on file  Tobacco Use: Medium Risk   Smoking Tobacco Use:  Former   Smokeless Tobacco Use: Never   Passive Exposure: Not on file  Transportation Needs: No Transportation Needs   Lack of Transportation (Medical): No   Lack of Transportation (Non-Medical): No    Last Labs:  Hospital Outpatient Visit on 11/28/2021  Component Date Value Ref Range Status   Single Plane A2C EF 11/28/2021 56.8  % Final   Single Plane A4C EF 11/28/2021 51.7  % Final   Calc EF 11/28/2021 55.2  % Final   AR max vel 11/28/2021 2.10  cm2 Final   AV Area VTI 11/28/2021 2.57  cm2 Final   AV Mean grad 11/28/2021 3.0  mmHg Final   AV Peak grad 11/28/2021 5.6  mmHg Final   Ao pk vel 11/28/2021 1.18  m/s Final   AV Area mean vel 11/28/2021 2.03  cm2 Final   MV VTI 11/28/2021 2.96  cm2 Final   Area-P 1/2 11/28/2021 3.31  cm2 Final   S' Lateral 11/28/2021 3.70  cm Final  Office Visit on 11/17/2021  Component Date Value Ref Range Status   Glucose, UA 11/17/2021 Negative  Negative Final   Bilirubin, UA 11/17/2021 negative   Final   Ketones, UA 11/17/2021 negative   Final   Spec Grav, UA 11/17/2021 1.010  1.010 - 1.025 Final   Blood, UA 11/17/2021 negative   Final   pH, UA 11/17/2021 6.0  5.0 - 8.0 Final   Protein, UA 11/17/2021 Positive (A)  Negative Final   Urobilinogen, UA 11/17/2021 1.0  0.2 or 1.0 E.U./dL Final   Nitrite, UA 11/17/2021 positive   Final   Leukocytes, UA 11/17/2021 Large (3+) (A)  Negative Final  Hospital Outpatient Visit on 11/13/2021  Component Date Value Ref Range Status   CYTOLOGY - NON GYN 11/13/2021    Final-Edited                   Value:CYTOLOGY - NON PAP CASE: [id number] PATIENT: Leafy Kindle Non-Gynecological Cytology Report     Clinical History: 3.3 cm RUL Specimen Submitted:  A. THYROID, RUL, FINE NEEDLE ASPIRATION:   FINAL MICROSCOPIC DIAGNOSIS: - Atypia of undetermined significance (Bethesda category III)  SPECIMEN ADEQUACY: Satisfactory for evaluation  DIAGNOSTIC COMMENTS: This specimen will be sent for Afirma  testing.  GROSS: Received is/are 6 slides in 95% Ethyl alcohol and 30 ccs of pale pink  Cytolyt solution. (CM:cm) Prepared: Smears:  6 Concentration Method (Thin Prep):  1 Cell Block:  Cell block attempted, not obtained. Additional Studies:  Also there was an Afirma collected.     Final Diagnosis performed by Jaquita Folds, MD.   Electronically signed 11/14/2021 Technical component performed at [facility], [place] [address] [place], [place] [address].  Professional component performed at [facility], [place] [address],                          [place], [place] [address].  Immunohistochemistry Technical component (if applicable) was performed at [facility]. [address] [place], [place], [place] [address].   IMMUNOHISTOCHEMISTRY DISCLAIMER (if applicable): Some of these immunohistochemical stains may have been developed and the performance characteristics determine by [facility]. Some may not have been cleared or approved by the U.S. Food and Drug Administration. The FDA has determined that such clearance or approval is not necessary. This test is used for clinical purposes. It should not be regarded as investigational or for research. This laboratory is certified under the [place] (CLIA-88) as qualified to perform high complexity clinical laboratory testing.  The controls stained appropriately.   Office Visit on 11/03/2021  Component Date Value Ref Range Status   TSH 11/03/2021 0.796  0.450 - 4.500 uIU/mL Final   Free T4 11/03/2021 0.91  0.82 - 1.77 ng/dL Final   T3, Free 11/03/2021 2.7  2.0 - 4.4 pg/mL Final   Thyroperoxidase Ab SerPl-aCnc 11/03/2021 14  0 - 34 IU/mL Final   Thyroglobulin Antibody 11/03/2021 <1.0  0.0 - 0.9 IU/mL Final   Thyroglobulin Antibody measured by [facility] Outpatient Visit on 10/08/2021  Component Date  Value Ref Range Status   TSH 10/08/2021 0.210 (L)  0.350 - 4.500 uIU/mL Final   Comment: Performed by a 3rd Generation assay with a functional sensitivity of <=0.01 uIU/mL. Performed at [facility], [address], [place], [place] [address]    Hgb A1c MFr Bld 10/08/2021 5.8 (H)  4.8 - 5.6 % Final   Comment: (NOTE) Pre diabetes:          5.7%-6.4%  Diabetes:              >6.4%  Glycemic control for   <7.0% adults with diabetes    Mean Plasma Glucose 10/08/2021 119.76  mg/dL Final   Performed at [place] [address] [place],  [address]   Cholesterol 10/08/2021 153  0 - 200 mg/dL Final   Triglycerides 10/08/2021 107  <150 mg/dL Final   HDL 10/08/2021 42  >40 mg/dL Final   Total CHOL/HDL Ratio 10/08/2021 3.6  RATIO Final   VLDL 10/08/2021 21  0 - 40 mg/dL Final   LDL Cholesterol 10/08/2021 90  0 - 99 mg/dL Final   Comment:        Total Cholesterol/HDL:CHD Risk Coronary Heart Disease Risk Table                     Men   Women  1/2 Average Risk   3.4   3.3  Average Risk       5.0   4.4  2 X Average Risk   9.6   7.1  3 X Average Risk  23.4   11.0        Use the calculated Patient Ratio above and the  CHD Risk Table to determine the patient's CHD Risk.        ATP III CLASSIFICATION (LDL):  <100     mg/dL   Optimal  100-129  mg/dL   Near or Above                    Optimal  130-159  mg/dL   Borderline  160-189  mg/dL   High  >190     mg/dL   Very High Performed at [place] [place], [place] [address]    Sodium 10/08/2021 141  135 - 145 mmol/L Final   Potassium 10/08/2021 4.6  3.5 - 5.1 mmol/L Final   Chloride 10/08/2021 109  98 - 111 mmol/L Final   CO2 10/08/2021 21 (L)  22 - 32 mmol/L Final   Glucose, Bld 10/08/2021 109 (H)  70 - 99 mg/dL Final   Glucose reference range applies only to samples taken after fasting for at least 8 hours.   BUN 10/08/2021 25 (H)  6 - 20 mg/dL Final   Creatinine, Ser 10/08/2021 0.93  0.44 - 1.00 mg/dL  Final   Calcium 10/08/2021 9.6  8.9 - 10.3 mg/dL Final   Total Protein 10/08/2021 7.6  6.5 - 8.1 g/dL Final   Albumin 10/08/2021 3.7  3.5 - 5.0 g/dL Final   AST 10/08/2021 20  15 - 41 U/L Final   ALT 10/08/2021 26  0 - 44 U/L Final   Alkaline Phosphatase 10/08/2021 99  38 - 126 U/L Final   Total Bilirubin 10/08/2021 0.3  0.3 - 1.2 mg/dL Final   GFR, Estimated 10/08/2021 >60  >60 mL/min Final   Comment: (NOTE) Calculated using the CKD-EPI Creatinine Equation (2021)    Anion gap 10/08/2021 11  5 - 15 Final   Performed at [facility], [address] [place], [place] [address]  Orders Only on 10/07/2021  Component Date Value Ref Range Status   IFOBT 10/07/2021 Negative   Final  Admission on 09/07/2021, Discharged on 09/07/2021  Component Date Value Ref Range Status   Sodium 09/07/2021 136  135 - 145 mmol/L Final   Potassium 09/07/2021 4.2  3.5 - 5.1 mmol/L Final   Chloride 09/07/2021 105  98 - 111 mmol/L Final   CO2 09/07/2021 22  22 - 32 mmol/L Final   Glucose, Bld 09/07/2021 103 (H)  70 - 99 mg/dL Final   Glucose reference range applies only to samples taken after fasting for at least 8 hours.   BUN 09/07/2021 29 (H)  6 - 20 mg/dL Final   Creatinine, Ser 09/07/2021 0.88  0.44 - 1.00 mg/dL Final   Calcium 09/07/2021 9.4  8.9 - 10.3 mg/dL Final   GFR, Estimated 09/07/2021 >60  >60 mL/min Final   Comment: (NOTE) Calculated using the CKD-EPI Creatinine Equation (2021)    Anion gap 09/07/2021 9  5 - 15 Final   Performed at [facility], [address], [place],  [address]   WBC 09/07/2021 5.8  4.0 - 10.5 K/uL Final   RBC 09/07/2021 4.71  3.87 - 5.11 MIL/uL Final   Hemoglobin 09/07/2021 13.0  12.0 - 15.0 g/dL Final   HCT 09/07/2021 40.6  36.0 - 46.0 % Final   MCV 09/07/2021 86.2  80.0 - 100.0 fL Final   MCH 09/07/2021 27.6  26.0 - 34.0 pg Final   MCHC 09/07/2021 32.0  30.0 - 36.0 g/dL Final   RDW 09/07/2021 11.9  11.5 - 15.5 % Final  Platelets 09/07/2021 257  150 - 400 K/uL  Final   nRBC 09/07/2021 0.0  0.0 - 0.2 % Final   Performed at [facility], [address], [place], [place] [address]   Troponin I (High Sensitivity) 09/07/2021 7  <18 ng/L Final   Comment: (NOTE) Elevated high sensitivity troponin I (hsTnI) values and significant  changes across serial measurements may suggest ACS but many other  chronic and acute conditions are known to elevate hsTnI results.  Refer to the "Links" section for chest pain algorithms and additional  guidance. Performed at [facility], [address], [place], [place] [address]    Troponin I (High Sensitivity) 09/07/2021 6  <18 ng/L Final   Comment: (NOTE) Elevated high sensitivity troponin I (hsTnI) values and significant  changes across serial measurements may suggest ACS but many other  chronic and acute conditions are known to elevate hsTnI results.  Refer to the "Links" section for chest pain algorithms and additional  guidance. Performed at [facility], [address], [place], [place] [address]    SARS Coronavirus 2 by RT PCR 09/07/2021 NEGATIVE  NEGATIVE Final   Comment: (NOTE) SARS-CoV-2 target nucleic acids are NOT DETECTED.  The SARS-CoV-2 RNA is generally detectable in upper respiratory specimens during the acute phase of infection. The lowest concentration of SARS-CoV-2 viral copies this assay can detect is 138 copies/mL. A negative result does not preclude SARS-Cov-2 infection and should not be used as the sole basis for treatment or other patient management decisions. A negative result may occur with  improper specimen collection/handling, submission of specimen other than nasopharyngeal swab, presence of viral mutation(s) within the areas targeted by this assay, and inadequate number of viral copies(<138 copies/mL). A negative result must be combined with clinical observations, patient history, and epidemiological information. The expected result is Negative.  Fact Sheet for Patients:   EntrepreneurPulse.com.au  Fact Sheet for Healthcare Providers:  IncredibleEmployment.be  This test is no                          t yet approved or cleared by the [place] FDA and  has been authorized for detection and/or diagnosis of SARS-CoV-2 by FDA under an Emergency Use Authorization (EUA). This EUA will remain  in effect (meaning this test can be used) for the duration of the COVID-19 declaration under Section 564(b)(1) of the Act, 21 U.S.C.section 360bbb-3(b)(1), unless the authorization is terminated  or revoked sooner.       Influenza A by PCR 09/07/2021 NEGATIVE  NEGATIVE Final   Influenza B by PCR 09/07/2021 NEGATIVE  NEGATIVE Final   Comment: (NOTE) The Xpert Xpress SARS-CoV-2/FLU/RSV plus assay is intended as an aid in the diagnosis of influenza from Nasopharyngeal swab specimens and should not be used as a sole basis for treatment. Nasal washings and aspirates are unacceptable for Xpert Xpress SARS-CoV-2/FLU/RSV testing.  Fact Sheet for Patients: EntrepreneurPulse.com.au  Fact Sheet for Healthcare Providers: IncredibleEmployment.be  This test is not yet approved or cleared by the [place] FDA and has been authorized for detection and/or diagnosis of SARS-CoV-2 by FDA under an Emergency Use Authorization (EUA). This EUA will remain in effect (meaning this test can be used) for the duration of the COVID-19 declaration under Section 564(b)(1) of the Act, 21 U.S.C. section 360bbb-3(b)(1), unless the authorization is terminated or revoked.  Performed at [facility], [address] [place], [place] [address]   Congregational Nurse Program on 09/03/2021  Component  Date Value Ref Range Status   POC Glucose 09/03/2021 95  70 - 99 mg/dl Final   nonfasting    Allergies: Codeine, Methocarbamol, and Nsaids  PTA Medications: (Not in a hospital admission)   Medical Decision Making    -VOLUNTARY admission to the [facility] observation unit for continuous assessment   MDD, recurrent, severe -Start Cymbalta 30 mg daily for depression and pain  -r/b/se of medication discussed  PRN Tylenol, Vistaril, and Maalox   Admission labs pending    Recommendations  Based on my evaluation the patient does not appear to have an emergency medical condition.   Corky Sox, MD 01/16/22  5:11 PM

## 2022-01-16 NOTE — ED Notes (Signed)
Pt sitting up at present, watching TV, no distress noted.  Comfort measures given.  Monitoring for safety.

## 2022-01-16 NOTE — ED Notes (Signed)
Patient admitted to obs due to suicidal ideation secondary to stress at home.  Patient tearful, needy, guarded and simple in thought.  Patient is somatic with multiple complaints and wants.  Patient disheveled and oddly related.  She was cooperative with admission process and was shown onto the unit.  She is eating a snack now and watching TV.

## 2022-01-16 NOTE — ED Notes (Signed)
Pt is in the bed sleeping. Respirations are even and unlabored. No acute distress noted. Will continue to monitor for safety. 

## 2022-01-16 NOTE — ED Triage Notes (Signed)
Pt presents to [facility] voluntarily seeking a mental health evaluation due to SI and a plan to kill herself by using the car exhaust. Pt states that her mother is a stressor for her and she is the reason she wants to harm herself. Pt states that she plans on killing herself in her mothers driveway. Pt states that she did not follow through with this plan because of her love of God and believes if she kills herself she won't go to heaven.Pt states that she has HI towards "everybody, I want everybody to leave me alone". Pt states "I need to commit myself for SI, I have my bag in the car because I plan on staying a few days". Pt reports being diagnosed with Bipolar, PTSD. Pt states that she is not currently taking any psychiatric medication. Pt states that she sees a psychiatrist [facility] in [place]. Pt states that she has a therapist in [place] but has not seen him in over a month. Pt is holding her neck, stating that she has pain in her neck. Pt denies AVH.

## 2022-01-16 NOTE — Discharge Instructions (Addendum)
Take all medications as prescribed. Keep all follow-up appointments as scheduled.  Do not consume alcohol or use illegal drugs while on prescription medications. Report any adverse effects from your medications to your primary care provider promptly.  In the event of recurrent symptoms or worsening symptoms, call 911, a crisis hotline, or go to the nearest emergency department for evaluation.   

## 2022-01-17 LAB — COMPREHENSIVE METABOLIC PANEL
ALT: 29 U/L (ref 0–44)
AST: 22 U/L (ref 15–41)
Albumin: 4.1 g/dL (ref 3.5–5.0)
Alkaline Phosphatase: 98 U/L (ref 38–126)
Anion gap: 11 (ref 5–15)
BUN: 14 mg/dL (ref 6–20)
CO2: 26 mmol/L (ref 22–32)
Calcium: 9.5 mg/dL (ref 8.9–10.3)
Chloride: 103 mmol/L (ref 98–111)
Creatinine, Ser: 1.02 mg/dL — ABNORMAL HIGH (ref 0.44–1.00)
GFR, Estimated: >60 mL/min (ref >60)
Glucose, Bld: 71 mg/dL (ref 70–99)
Potassium: 4.5 mmol/L (ref 3.5–5.1)
Sodium: 140 mmol/L (ref 135–145)
Total Bilirubin: 0.8 mg/dL (ref 0.3–1.2)
Total Protein: 6.5 g/dL (ref 6.5–8.1)

## 2022-01-17 LAB — CBC WITH DIFFERENTIAL/PLATELET
Abs Immature Granulocytes: 0.03 10*3/uL (ref 0.00–0.07)
Basophils Absolute: 0.1 10*3/uL (ref 0.0–0.1)
Basophils Relative: 1 %
Eosinophils Absolute: 0.2 10*3/uL (ref 0.0–0.5)
Eosinophils Relative: 2 %
HCT: 40.3 % (ref 36.0–46.0)
Hemoglobin: 12.9 g/dL (ref 12.0–15.0)
Immature Granulocytes: 0 %
Lymphocytes Relative: 30 %
Lymphs Abs: 2.6 10*3/uL (ref 0.7–4.0)
MCH: 27.9 pg (ref 26.0–34.0)
MCHC: 32 g/dL (ref 30.0–36.0)
MCV: 87 fL (ref 80.0–100.0)
Monocytes Absolute: 0.6 10*3/uL (ref 0.1–1.0)
Monocytes Relative: 7 %
Neutro Abs: 5.1 10*3/uL (ref 1.7–7.7)
Neutrophils Relative %: 60 %
Platelets: 335 10*3/uL (ref 150–400)
RBC: 4.63 MIL/uL (ref 3.87–5.11)
RDW: 13.6 % (ref 11.5–15.5)
WBC: 8.5 10*3/uL (ref 4.0–10.5)
nRBC: 0 % (ref 0.0–0.2)

## 2022-01-17 LAB — ETHANOL: Alcohol, Ethyl (B): <10 mg/dL (ref <10)

## 2022-01-17 LAB — RESP PANEL BY RT-PCR (FLU A&B, COVID) ARPGX2
Influenza A by PCR: NEGATIVE
Influenza B by PCR: NEGATIVE
SARS Coronavirus 2 by RT PCR: NEGATIVE

## 2022-01-17 MED ORDER — TRAZODONE HCL 50 MG PO TABS
50.0000 mg | ORAL_TABLET | Freq: Once | ORAL | Status: AC | PRN
  Administered 2022-01-17: 50 mg via ORAL
  Filled 2022-01-17: qty 1

## 2022-01-17 MED ORDER — PANTOPRAZOLE SODIUM 40 MG PO TBEC
40.0000 mg | DELAYED_RELEASE_TABLET | Freq: Every day | ORAL | Status: DC
Start: 2022-01-17 — End: 2022-01-18
  Administered 2022-01-17 – 2022-01-18 (×2): 40 mg via ORAL
  Filled 2022-01-17 (×2): qty 1

## 2022-01-17 MED ORDER — SUMATRIPTAN SUCCINATE 50 MG PO TABS: 50.0000 mg | ORAL_TABLET | Freq: Two times a day (BID) | ORAL | Status: DC | PRN

## 2022-01-17 MED ORDER — DULOXETINE HCL 30 MG PO CPEP
30.0000 mg | ORAL_CAPSULE | Freq: Every day | ORAL | Status: DC
  Administered 2022-01-18: 30 mg via ORAL
  Filled 2022-01-17: qty 1

## 2022-01-17 MED ORDER — GABAPENTIN 300 MG PO CAPS
600.0000 mg | ORAL_CAPSULE | Freq: Two times a day (BID) | ORAL | Status: DC
Start: 2022-01-17 — End: 2022-01-18
  Administered 2022-01-17 – 2022-01-18 (×3): 600 mg via ORAL
  Filled 2022-01-17 (×3): qty 2

## 2022-01-17 MED ORDER — TRAZODONE HCL 50 MG PO TABS
50.0000 mg | ORAL_TABLET | Freq: Every day | ORAL | Status: DC
Start: 2022-01-17 — End: 2022-01-18
  Administered 2022-01-17: 50 mg via ORAL
  Filled 2022-01-17: qty 1

## 2022-01-17 NOTE — ED Notes (Signed)
Pt was given a muffin this morning

## 2022-01-17 NOTE — ED Notes (Signed)
Pt A&O x 4, no distress noted, talking with other pt's at present. Calm & cooperative. Passive SI noted.  Contracts for safety.  Monitoring for safety.

## 2022-01-17 NOTE — ED Notes (Signed)
Pt sleeping at present, no distress noted, respirations even & unlabored.  Monitoring for safety. ?

## 2022-01-17 NOTE — ED Provider Notes (Signed)
Behavioral Health Progress Note  Date and Time: 01/17/2022 10:56 AM Name: Briana Wyatt MRN:  [id number]  Subjective: Patient states "I know what medicine works for me and I know what I need but I cannot get anyone to put me back on it, I cannot sleep at night because of the pain."  She reports chronic pain and intermittent migraine headaches.  Briana Wyatt was seen by outpatient neurology on yesterday related to chronic pain and chronic migraine headache.  She reports gabapentin was increased at that time.  Briana Wyatt continues to endorse suicidal ideation, denies plan or intent at this time.  She is unable to contract verbally for safety at this time.   She reports recent stressors include "I hate my life, everybody treats me like crap."  She endorses difficult living situation, resides in a camper and has access to bathroom and kitchen privileges at her mother's house.  She reports "my mom and stepdad treatment like I do not exist, I walk in the house and nobody speaks to me."  Patient reports she would like to be admitted to inpatient psychiatric facility because "I just need to be away from them."  She has been diagnosed with suicidal ideation, major depressive disorder, and anxiety.  She has a history of cocaine use disorder.  She is followed by outpatient psychiatry at [facility] in [place].  She reports she is currently prescribed Cymbalta, would like to have Lyrica added, currently outpatient provider is not willing to add medication as patient requests.  She reports compliance with current medications.  She is also followed by outpatient counseling at [facility].  She endorses history of multiple inpatient psychiatric hospitalizations.   Patient is reassessed by nurse practitioner, face-to-face. She is reclined in observation area upon my approach, no apparent distress.   Briana Wyatt denies homicidal ideations.  She denies auditory and visual hallucinations.  There is no evidence of delusional  thought content and no indication that patient is responding to internal stimuli.  Patient resides in [place] on her mother's property.  She denies access to weapons.  She reports she is currently working with an attorney to secure her disability benefits.  She denies alcohol and substance use.  She endorses average appetite and decreased sleep.  Patient offered support and encouragement.   Diagnosis:  Final diagnoses:  Severe episode of recurrent major depressive disorder, without psychotic features ([place])  Suicidal ideation    Total Time spent with patient: 30 minutes  Past Psychiatric History: suicidal ideation, major depressive disorder, anxiety. 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   Hypertension    Restless leg syndrome    Sciatica of right side     Past Surgical History:  Procedure Laterality Date   CARPAL TUNNEL RELEASE Bilateral    ECTOPIC PREGNANCY SURGERY     SHOULDER SURGERY Right    TUBAL LIGATION     Family History:  Family History  Problem Relation Age of Onset   Hypertension Father    Stroke Father    Family Psychiatric  History: None reported Social History:  Social History   Substance and Sexual Activity  Alcohol Use No     Social History   Substance and Sexual Activity  Drug Use Not Currently   Types: Marijuana, Cocaine   Comment: denies use 10/02/21    Social History   Socioeconomic History   Marital status: Single  Spouse name: Not on file   Number of children: 3   Years of education: Not on file   Highest education level: GED or equivalent  Occupational History   Not on file  Tobacco Use   Smoking status: Former    Types: Cigarettes    Quit date: 08/25/1995    Years since quitting: 26.4   Smokeless tobacco: Never  Vaping Use   Vaping Use: Never used  Substance and Sexual Activity   Alcohol use: No   Drug  use: Not Currently    Types: Marijuana, Cocaine    Comment: denies use 10/02/21   Sexual activity: Not Currently    Birth control/protection: Surgical  Other Topics Concern   Not on file  Social History Narrative   Not on file   Social Determinants of Health   Financial Resource Strain: Not on file  Food Insecurity: No Food Insecurity   Worried About Charity fundraiser in the Last Year: Never true   Earth in the Last Year: Never true  Transportation Needs: No Transportation Needs   Lack of Transportation (Medical): No   Lack of Transportation (Non-Medical): No  Physical Activity: Not on file  Stress: Not on file  Social Connections: Not on file   SDOH:  SDOH Screenings   Alcohol Screen: Not on file  Depression (PHQ2-9): Not on file  Financial Resource Strain: Not on file  Food Insecurity: No Food Insecurity   Worried About Running Out of Food in the Last Year: Never true   Ran Out of Food in the Last Year: Never true  Housing: Not on file  Physical Activity: Not on file  Social Connections: Not on file  Stress: Not on file  Tobacco Use: Medium Risk   Smoking Tobacco Use: Former   Smokeless Tobacco Use: Never   Passive Exposure: Not on file  Transportation Needs: No Transportation Needs   Lack of Transportation (Medical): No   Lack of Transportation (Non-Medical): No   Additional Social History:    Pain Medications: see MAR Prescriptions: see MAR Over the Counter: see MAR History of alcohol / drug use?: Yes Longest period of sobriety (when/how long): Sober for the past year Negative Consequences of Use: Financial, Personal relationships Name of Substance 1: Crack 1 - Last Use / Amount: 08/2020                  Sleep: Fair  Appetite:  Good  Current Medications:  Current Facility-Administered Medications  Medication Dose Route Frequency Provider Last Rate Last Admin   acetaminophen (TYLENOL) tablet 650 mg  650 mg Oral Q6H PRN Corky Sox,  MD       alum & mag hydroxide-simeth (MAALOX/MYLANTA) 200-200-20 MG/5ML suspension 30 mL  30 mL Oral Q4H PRN Corky Sox, MD       hydrOXYzine (ATARAX) tablet 25 mg  25 mg Oral TID PRN Corky Sox, MD   25 mg at 01/17/22 0153   magnesium hydroxide (MILK OF MAGNESIA) suspension 30 mL  30 mL Oral Daily PRN Corky Sox, MD       Current Outpatient Medications  Medication Sig Dispense Refill   acetaminophen (TYLENOL) 650 MG CR tablet Take 650 mg by mouth as needed.     albuterol (VENTOLIN HFA) 108 (90 Base) MCG/ACT inhaler INHALE 2 PUFFS BY MOUTH EVERY 6 HOURS AS NEEDED FOR COUGHING, WHEEZING, OR SHORTNESS OF BREATH 20.1 g 0   baclofen (LIORESAL) 10 MG tablet Take 10 mg by mouth  2 (two) times daily.     Calcium Carbonate-Vitamin D (CALTRATE 600+D PO) Take by mouth.     cetirizine (ZYRTEC) 10 MG tablet Take 10 mg by mouth daily.     Cholecalciferol (VITAMIN D-3 PO) Take 1 tablet by mouth daily.     gabapentin (NEURONTIN) 300 MG capsule Take 2 capsules (600 mg total) by mouth 2 (two) times daily. 120 capsule 0   lidocaine (LIDODERM) 5 % Place 1 patch onto the skin daily. Remove & Discard patch within 12 hours or as directed by MD 30 patch 0   losartan (COZAAR) 50 MG tablet TAKE 1 Tablet BY MOUTH ONCE EVERY DAY 90 tablet 0   metoprolol tartrate (LOPRESSOR) 100 MG tablet Take 1 tablet by mouth 2 hours prior to CT Scan 1 tablet 0   Multiple Vitamins-Minerals (CENTRUM SILVER PO) Take 1 tablet by mouth daily.     traMADol (ULTRAM) 50 MG tablet TAKE 1 TABLET(50 MG) BY MOUTH EVERY 12 HOURS AS NEEDED 30 tablet 0    Labs  Lab Results:  Admission on 01/16/2022  Component Date Value Ref Range Status   SARS Coronavirus 2 by RT PCR 01/16/2022 NEGATIVE  NEGATIVE Final   Comment: (NOTE) SARS-CoV-2 target nucleic acids are NOT DETECTED.  The SARS-CoV-2 RNA is generally detectable in upper respiratory specimens during the acute phase of infection. The lowest concentration of SARS-CoV-2 viral  copies this assay can detect is 138 copies/mL. A negative result does not preclude SARS-Cov-2 infection and should not be used as the sole basis for treatment or other patient management decisions. A negative result may occur with  improper specimen collection/handling, submission of specimen other than nasopharyngeal swab, presence of viral mutation(s) within the areas targeted by this assay, and inadequate number of viral copies(<138 copies/mL). A negative result must be combined with clinical observations, patient history, and epidemiological information. The expected result is Negative.  Fact Sheet for Patients:  EntrepreneurPulse.com.au  Fact Sheet for Healthcare Providers:  IncredibleEmployment.be  This test is no                          t yet approved or cleared by the [place] FDA and  has been authorized for detection and/or diagnosis of SARS-CoV-2 by FDA under an Emergency Use Authorization (EUA). This EUA will remain  in effect (meaning this test can be used) for the duration of the COVID-19 declaration under Section 564(b)(1) of the Act, 21 U.S.C.section 360bbb-3(b)(1), unless the authorization is terminated  or revoked sooner.       Influenza A by PCR 01/16/2022 NEGATIVE  NEGATIVE Final   Influenza B by PCR 01/16/2022 NEGATIVE  NEGATIVE Final   Comment: (NOTE) The Xpert Xpress SARS-CoV-2/FLU/RSV plus assay is intended as an aid in the diagnosis of influenza from Nasopharyngeal swab specimens and should not be used as a sole basis for treatment. Nasal washings and aspirates are unacceptable for Xpert Xpress SARS-CoV-2/FLU/RSV testing.  Fact Sheet for Patients: EntrepreneurPulse.com.au  Fact Sheet for Healthcare Providers: IncredibleEmployment.be  This test is not yet approved or cleared by the [place] FDA and has been authorized for detection and/or diagnosis of SARS-CoV-2 by FDA  under an Emergency Use Authorization (EUA). This EUA will remain in effect (meaning this test can be used) for the duration of the COVID-19 declaration under Section 564(b)(1) of the Act, 21 U.S.C. section 360bbb-3(b)(1), unless the authorization is terminated or revoked.  Performed at [facility], [address] [place], [place] [address]    WBC 01/16/2022 8.5  4.0 - 10.5 K/uL Final   RBC 01/16/2022 4.63  3.87 - 5.11 MIL/uL Final   Hemoglobin 01/16/2022 12.9  12.0 - 15.0 g/dL Final   HCT 01/16/2022 40.3  36.0 - 46.0 % Final   MCV 01/16/2022 87.0  80.0 - 100.0 fL Final   MCH 01/16/2022 27.9  26.0 - 34.0 pg Final   MCHC 01/16/2022 32.0  30.0 - 36.0 g/dL Final   RDW 01/16/2022 13.6  11.5 - 15.5 % Final   Platelets 01/16/2022 335  150 - 400 K/uL Final   nRBC 01/16/2022 0.0  0.0 - 0.2 % Final   Neutrophils Relative % 01/16/2022 60  % Final   Neutro Abs 01/16/2022 5.1  1.7 - 7.7 K/uL Final   Lymphocytes Relative 01/16/2022 30  % Final   Lymphs Abs 01/16/2022 2.6  0.7 - 4.0 K/uL Final   Monocytes Relative 01/16/2022 7  % Final   Monocytes Absolute 01/16/2022 0.6  0.1 - 1.0 K/uL Final   Eosinophils Relative 01/16/2022 2  % Final   Eosinophils Absolute 01/16/2022 0.2  0.0 - 0.5 K/uL Final   Basophils Relative 01/16/2022 1  % Final   Basophils Absolute 01/16/2022 0.1  0.0 - 0.1 K/uL Final   Immature Granulocytes 01/16/2022 0  % Final   Abs Immature Granulocytes 01/16/2022 0.03  0.00 - 0.07 K/uL Final   Performed at [facility], [place] [address], [place], [place] [address]   Sodium 01/16/2022 140  135 - 145 mmol/L Final   Potassium 01/16/2022 4.5  3.5 - 5.1 mmol/L Final   Chloride 01/16/2022 103  98 - 111 mmol/L Final   CO2 01/16/2022 26  22 - 32 mmol/L Final   Glucose, Bld 01/16/2022 71  70 - 99 mg/dL Final   Glucose reference range applies only to samples taken after fasting for at least 8 hours.   BUN 01/16/2022 14  6 - 20 mg/dL Final   Creatinine, Ser 01/16/2022 1.02 (H)   0.44 - 1.00 mg/dL Final   Calcium 01/16/2022 9.5  8.9 - 10.3 mg/dL Final   Total Protein 01/16/2022 6.5  6.5 - 8.1 g/dL Final   Albumin 01/16/2022 4.1  3.5 - 5.0 g/dL Final   AST 01/16/2022 22  15 - 41 U/L Final   ALT 01/16/2022 29  0 - 44 U/L Final   Alkaline Phosphatase 01/16/2022 98  38 - 126 U/L Final   Total Bilirubin 01/16/2022 0.8  0.3 - 1.2 mg/dL Final   GFR, Estimated 01/16/2022 >60  >60 mL/min Final   Comment: (NOTE) Calculated using the CKD-EPI Creatinine Equation (2021)    Anion gap 01/16/2022 11  5 - 15 Final   Performed at [facility] [address] [place], [place] [address]   Alcohol, Ethyl (B) 01/16/2022 <10  <10 mg/dL Final   Comment: (NOTE) Lowest detectable limit for serum alcohol is 10 mg/dL.  For medical purposes only. Performed at [facility], [place] [address] [place], [place] [address]    POC Amphetamine UR 01/16/2022 None Detected  NONE DETECTED (Cut Off Level 1000 ng/mL) Final   POC Secobarbital (BAR) 01/16/2022 None Detected  NONE DETECTED (Cut Off Level 300 ng/mL) Final   POC Buprenorphine (BUP) 01/16/2022 None Detected  NONE DETECTED (Cut Off Level 10 ng/mL) Final   POC Oxazepam (BZO) 01/16/2022 None Detected  NONE DETECTED (Cut Off Level 300 ng/mL) Final   POC  Cocaine UR 01/16/2022 None Detected  NONE DETECTED (Cut Off Level 300 ng/mL) Final   POC Methamphetamine UR 01/16/2022 None Detected  NONE DETECTED (Cut Off Level 1000 ng/mL) Final   POC Morphine 01/16/2022 None Detected  NONE DETECTED (Cut Off Level 300 ng/mL) Final   POC Methadone UR 01/16/2022 None Detected  NONE DETECTED (Cut Off Level 300 ng/mL) Final   POC Oxycodone UR 01/16/2022 None Detected  NONE DETECTED (Cut Off Level 100 ng/mL) Final   POC Marijuana UR 01/16/2022 None Detected  NONE DETECTED (Cut Off Level 50 ng/mL) Final   SARSCOV2ONAVIRUS 2 AG 01/16/2022 NEGATIVE  NEGATIVE Final   Comment: (NOTE) SARS-CoV-2 antigen NOT DETECTED.   Negative results are presumptive.   Negative results do not preclude SARS-CoV-2 infection and should not be used as the sole basis for treatment or other patient management decisions, including infection  control decisions, particularly in the presence of clinical signs and  symptoms consistent with COVID-19, or in those who have been in contact with the virus.  Negative results must be combined with clinical observations, patient history, and epidemiological information. The expected result is Negative.  Fact Sheet for Patients: HandmadeRecipes.com.cy  Fact Sheet for Healthcare Providers: FuneralLife.at  This test is not yet approved or cleared by the [place] FDA and  has been authorized for detection and/or diagnosis of SARS-CoV-2 by FDA under an Emergency Use Authorization (EUA).  This EUA will remain in effect (meaning this test can be used) for the duration of  the COV                          ID-19 declaration under Section 564(b)(1) of the Act, 21 U.S.C. section 360bbb-3(b)(1), unless the authorization is terminated or revoked sooner.    Hospital Outpatient Visit on 11/28/2021  Component Date Value Ref Range Status   Single Plane A2C EF 11/28/2021 56.8  % Final   Single Plane A4C EF 11/28/2021 51.7  % Final   Calc EF 11/28/2021 55.2  % Final   AR max vel 11/28/2021 2.10  cm2 Final   AV Area VTI 11/28/2021 2.57  cm2 Final   AV Mean grad 11/28/2021 3.0  mmHg Final   AV Peak grad 11/28/2021 5.6  mmHg Final   Ao pk vel 11/28/2021 1.18  m/s Final   AV Area mean vel 11/28/2021 2.03  cm2 Final   MV VTI 11/28/2021 2.96  cm2 Final   Area-P 1/2 11/28/2021 3.31  cm2 Final   S' Lateral 11/28/2021 3.70  cm Final  Office Visit on 11/17/2021  Component Date Value Ref Range Status   Glucose, UA 11/17/2021 Negative  Negative Final   Bilirubin, UA 11/17/2021 negative   Final   Ketones, UA 11/17/2021 negative   Final   Spec Grav, UA 11/17/2021 1.010  1.010 - 1.025 Final    Blood, UA 11/17/2021 negative   Final   pH, UA 11/17/2021 6.0  5.0 - 8.0 Final   Protein, UA 11/17/2021 Positive (A)  Negative Final   Urobilinogen, UA 11/17/2021 1.0  0.2 or 1.0 E.U./dL Final   Nitrite, UA 11/17/2021 positive   Final   Leukocytes, UA 11/17/2021 Large (3+) (A)  Negative Final  Hospital Outpatient Visit on 11/13/2021  Component Date Value Ref Range Status   CYTOLOGY - NON GYN 11/13/2021    Final-Edited                   Value:CYTOLOGY - NON PAP  CASE: [id number] PATIENT: Leafy Kindle Non-Gynecological Cytology Report     Clinical History: 3.3 cm RUL Specimen Submitted:  A. THYROID, RUL, FINE NEEDLE ASPIRATION:   FINAL MICROSCOPIC DIAGNOSIS: - Atypia of undetermined significance (Bethesda category III)  SPECIMEN ADEQUACY: Satisfactory for evaluation  DIAGNOSTIC COMMENTS: This specimen will be sent for Afirma testing.  GROSS: Received is/are 6 slides in 95% Ethyl alcohol and 30 ccs of pale pink Cytolyt solution. (CM:cm) Prepared: Smears:  6 Concentration Method (Thin Prep):  1 Cell Block:  Cell block attempted, not obtained. Additional Studies:  Also there was an Afirma collected.     Final Diagnosis performed by Jaquita Folds, MD.   Electronically signed 11/14/2021 Technical component performed at [facility], [place] [address] [place], [place] [address].  Professional component performed at [facility], [place] [address]                          [place], [place] [address].  Immunohistochemistry Technical component (if applicable) was performed at [facility]. [address] [place], [place], [place] [address].   IMMUNOHISTOCHEMISTRY DISCLAIMER (if applicable): Some of these immunohistochemical stains may have been developed and the performance characteristics determine by [facility]. Some may not have been cleared or approved by the U.S. Food and Drug Administration.  The FDA has determined that such clearance or approval is not necessary. This test is used for clinical purposes. It should not be regarded as investigational or for research. This laboratory is certified under the [place] (CLIA-88) as qualified to perform high complexity clinical laboratory testing.  The controls stained appropriately.   Office Visit on 11/03/2021  Component Date Value Ref Range Status   TSH 11/03/2021 0.796  0.450 - 4.500 uIU/mL Final   Free T4 11/03/2021 0.91  0.82 - 1.77 ng/dL Final   T3, Free 11/03/2021 2.7  2.0 - 4.4 pg/mL Final   Thyroperoxidase Ab SerPl-aCnc 11/03/2021 14  0 - 34 IU/mL Final   Thyroglobulin Antibody 11/03/2021 <1.0  0.0 - 0.9 IU/mL Final   Thyroglobulin Antibody measured by [facility] Outpatient Visit on 10/08/2021  Component Date Value Ref Range Status   TSH 10/08/2021 0.210 (L)  0.350 - 4.500 uIU/mL Final   Comment: Performed by a 3rd Generation assay with a functional sensitivity of <=0.01 uIU/mL. Performed at [facility], [address] [place], [place] [address]    Hgb A1c MFr Bld 10/08/2021 5.8 (H)  4.8 - 5.6 % Final   Comment: (NOTE) Pre diabetes:          5.7%-6.4%  Diabetes:              >6.4%  Glycemic control for   <7.0% adults with diabetes    Mean Plasma Glucose 10/08/2021 119.76  mg/dL Final   Performed at [facility] [address], [place], [place] [address]   Cholesterol 10/08/2021 153  0 - 200 mg/dL Final   Triglycerides 10/08/2021 107  <150 mg/dL Final   HDL 10/08/2021 42  >40 mg/dL Final   Total CHOL/HDL Ratio 10/08/2021 3.6  RATIO Final   VLDL 10/08/2021 21  0 - 40 mg/dL Final   LDL Cholesterol 10/08/2021 90  0 - 99 mg/dL Final   Comment:        Total Cholesterol/HDL:CHD Risk Coronary Heart Disease Risk Table  Men   Women  1/2 Average Risk   3.4   3.3  Average Risk       5.0   4.4  2 X Average Risk   9.6   7.1  3  X Average Risk  23.4   11.0        Use the calculated Patient Ratio above and the CHD Risk Table to determine the patient's CHD Risk.        ATP III CLASSIFICATION (LDL):  <100     mg/dL   Optimal  100-129  mg/dL   Near or Above                    Optimal  130-159  mg/dL   Borderline  160-189  mg/dL   High  >190     mg/dL   Very High Performed at [facility] [place], [place] [address]    Sodium 10/08/2021 141  135 - 145 mmol/L Final   Potassium 10/08/2021 4.6  3.5 - 5.1 mmol/L Final   Chloride 10/08/2021 109  98 - 111 mmol/L Final   CO2 10/08/2021 21 (L)  22 - 32 mmol/L Final   Glucose, Bld 10/08/2021 109 (H)  70 - 99 mg/dL Final   Glucose reference range applies only to samples taken after fasting for at least 8 hours.   BUN 10/08/2021 25 (H)  6 - 20 mg/dL Final   Creatinine, Ser 10/08/2021 0.93  0.44 - 1.00 mg/dL Final   Calcium 10/08/2021 9.6  8.9 - 10.3 mg/dL Final   Total Protein 10/08/2021 7.6  6.5 - 8.1 g/dL Final   Albumin 10/08/2021 3.7  3.5 - 5.0 g/dL Final   AST 10/08/2021 20  15 - 41 U/L Final   ALT 10/08/2021 26  0 - 44 U/L Final   Alkaline Phosphatase 10/08/2021 99  38 - 126 U/L Final   Total Bilirubin 10/08/2021 0.3  0.3 - 1.2 mg/dL Final   GFR, Estimated 10/08/2021 >60  >60 mL/min Final   Comment: (NOTE) Calculated using the CKD-EPI Creatinine Equation (2021)    Anion gap 10/08/2021 11  5 - 15 Final   Performed at [facility], [address], [place], [place] [address]  Orders Only on 10/07/2021  Component Date Value Ref Range Status   IFOBT 10/07/2021 Negative   Final  Admission on 09/07/2021, Discharged on 09/07/2021  Component Date Value Ref Range Status   Sodium 09/07/2021 136  135 - 145 mmol/L Final   Potassium 09/07/2021 4.2  3.5 - 5.1 mmol/L Final   Chloride 09/07/2021 105  98 - 111 mmol/L Final   CO2 09/07/2021 22  22 - 32 mmol/L Final   Glucose, Bld 09/07/2021 103 (H)  70 - 99 mg/dL Final   Glucose reference range applies only to  samples taken after fasting for at least 8 hours.   BUN 09/07/2021 29 (H)  6 - 20 mg/dL Final   Creatinine, Ser 09/07/2021 0.88  0.44 - 1.00 mg/dL Final   Calcium 09/07/2021 9.4  8.9 - 10.3 mg/dL Final   GFR, Estimated 09/07/2021 >60  >60 mL/min Final   Comment: (NOTE) Calculated using the CKD-EPI Creatinine Equation (2021)    Anion gap 09/07/2021 9  5 - 15 Final   Performed at [facility], [address], [place],  [address]   WBC 09/07/2021 5.8  4.0 - 10.5 K/uL Final   RBC 09/07/2021 4.71  3.87 - 5.11 MIL/uL Final   Hemoglobin  09/07/2021 13.0  12.0 - 15.0 g/dL Final   HCT 09/07/2021 40.6  36.0 - 46.0 % Final   MCV 09/07/2021 86.2  80.0 - 100.0 fL Final   MCH 09/07/2021 27.6  26.0 - 34.0 pg Final   MCHC 09/07/2021 32.0  30.0 - 36.0 g/dL Final   RDW 09/07/2021 11.9  11.5 - 15.5 % Final   Platelets 09/07/2021 257  150 - 400 K/uL Final   nRBC 09/07/2021 0.0  0.0 - 0.2 % Final   Performed at [facility], [address] [place], [place] [address]   Troponin I (High Sensitivity) 09/07/2021 7  <18 ng/L Final   Comment: (NOTE) Elevated high sensitivity troponin I (hsTnI) values and significant  changes across serial measurements may suggest ACS but many other  chronic and acute conditions are known to elevate hsTnI results.  Refer to the "Links" section for chest pain algorithms and additional  guidance. Performed at [facility], [address] [place], [place] [address]    Troponin I (High Sensitivity) 09/07/2021 6  <18 ng/L Final   Comment: (NOTE) Elevated high sensitivity troponin I (hsTnI) values and significant  changes across serial measurements may suggest ACS but many other  chronic and acute conditions are known to elevate hsTnI results.  Refer to the "Links" section for chest pain algorithms and additional  guidance. Performed at [facility], [address], [place], [place] [address]    SARS Coronavirus 2 by RT PCR 09/07/2021 NEGATIVE  NEGATIVE Final   Comment:  (NOTE) SARS-CoV-2 target nucleic acids are NOT DETECTED.  The SARS-CoV-2 RNA is generally detectable in upper respiratory specimens during the acute phase of infection. The lowest concentration of SARS-CoV-2 viral copies this assay can detect is 138 copies/mL. A negative result does not preclude SARS-Cov-2 infection and should not be used as the sole basis for treatment or other patient management decisions. A negative result may occur with  improper specimen collection/handling, submission of specimen other than nasopharyngeal swab, presence of viral mutation(s) within the areas targeted by this assay, and inadequate number of viral copies(<138 copies/mL). A negative result must be combined with clinical observations, patient history, and epidemiological information. The expected result is Negative.  Fact Sheet for Patients:  EntrepreneurPulse.com.au  Fact Sheet for Healthcare Providers:  IncredibleEmployment.be  This test is no                          t yet approved or cleared by the [place] FDA and  has been authorized for detection and/or diagnosis of SARS-CoV-2 by FDA under an Emergency Use Authorization (EUA). This EUA will remain  in effect (meaning this test can be used) for the duration of the COVID-19 declaration under Section 564(b)(1) of the Act, 21 U.S.C.section 360bbb-3(b)(1), unless the authorization is terminated  or revoked sooner.       Influenza A by PCR 09/07/2021 NEGATIVE  NEGATIVE Final   Influenza B by PCR 09/07/2021 NEGATIVE  NEGATIVE Final   Comment: (NOTE) The Xpert Xpress SARS-CoV-2/FLU/RSV plus assay is intended as an aid in the diagnosis of influenza from Nasopharyngeal swab specimens and should not be used as a sole basis for treatment. Nasal washings and aspirates are unacceptable for Xpert Xpress SARS-CoV-2/FLU/RSV testing.  Fact Sheet for Patients: EntrepreneurPulse.com.au  Fact  Sheet for Healthcare Providers: IncredibleEmployment.be  This test is not yet approved or cleared by the [place] FDA and has been authorized for detection and/or diagnosis of SARS-CoV-2  by FDA under an Emergency Use Authorization (EUA). This EUA will remain in effect (meaning this test can be used) for the duration of the COVID-19 declaration under Section 564(b)(1) of the Act, 21 U.S.C. section 360bbb-3(b)(1), unless the authorization is terminated or revoked.  Performed at [facility], [address] [place], [place] [address]   [place] [facility] on 09/03/2021  Component Date Value Ref Range Status   POC Glucose 09/03/2021 95  70 - 99 mg/dl Final   nonfasting    Blood Alcohol level:  Lab Results  Component Value Date   ETH <10 01/16/2022   ETH <10 65/10/5463    Metabolic Disorder Labs: Lab Results  Component Value Date   HGBA1C 5.8 (H) 10/08/2021   MPG 119.76 10/08/2021   MPG 122.63 09/17/2020   No results found for: PROLACTIN Lab Results  Component Value Date   CHOL 153 10/08/2021   TRIG 107 10/08/2021   HDL 42 10/08/2021   CHOLHDL 3.6 10/08/2021   VLDL 21 10/08/2021   LDLCALC 90 10/08/2021   LDLCALC 107 (H) 09/17/2020    Therapeutic Lab Levels: No results found for: LITHIUM No results found for: VALPROATE No components found for:  CBMZ  Physical Findings   PHQ2-9    Flowsheet Row ED from 09/14/2020 in [facility]  PHQ-2 Total Score 4  PHQ-9 Total Score 14      [facility] from 01/16/2022 in [facility] ED from 12/16/2021 in [place] ED from 09/07/2021 in [facility] CATEGORY High Risk No Risk No Risk        Musculoskeletal  Strength & Muscle Tone: within normal limits Gait & Station: normal Patient leans: N/A  Psychiatric Specialty Exam  Presentation  General Appearance: Casual; Appropriate for  Environment  Eye Contact:Fair  Speech:Clear and Coherent; Normal Rate  Speech Volume:Normal  Handedness:Right   Mood and Affect  Mood:Depressed  Affect:Depressed   Thought Process  Thought Processes:Coherent; Goal Directed; Linear  Descriptions of Associations:Intact  Orientation:Full (Time, Place and Person)  Thought Content:Logical; WDL  Diagnosis of Schizophrenia or Schizoaffective disorder in past: No data recorded Duration of Psychotic Symptoms: No data recorded  Hallucinations:Hallucinations: None  Ideas of Reference:None  Suicidal Thoughts:Suicidal Thoughts: Yes, Passive SI Active Intent and/or Plan: With Intent; With Plan; With Means to Carry Out  Homicidal Thoughts:Homicidal Thoughts: No   Sensorium  Memory:Immediate Good; Recent Fair  Judgment:Fair  Insight:Present; Shallow   Executive Functions  Concentration:Fair  Attention Span:Fair  [place] of Knowledge:Good  Language:Good   Psychomotor Activity  Psychomotor Activity:Psychomotor Activity: Normal   Assets  Assets:Communication Skills; Desire for Improvement; Financial Resources/Insurance; Housing; Intimacy; Leisure Time; Resilience   Sleep  Sleep:Sleep: Fair   No data recorded  Physical Exam  Physical Exam Vitals and nursing note reviewed.  Constitutional:      Appearance: Normal appearance. She is well-developed.  HENT:     Head: Normocephalic and atraumatic.     Nose: Nose normal.  Cardiovascular:     Rate and Rhythm: Normal rate.  Pulmonary:     Effort: Pulmonary effort is normal.  Musculoskeletal:        General: Normal range of motion.     Cervical back: Normal range of motion.  Skin:    General: Skin is warm and dry.  Neurological:     Mental Status: She is alert and oriented to person, place, and time.   Review of Systems  Musculoskeletal:  Positive for myalgias.       Diffuse, chronic pain  Psychiatric/Behavioral:  Positive for depression and  suicidal ideas.   Blood pressure 118/87, pulse 80, temperature 97.9 F (36.6 C), temperature source Oral, resp. rate 18, SpO2 100 %. There is no height or weight on file to calculate BMI.  Treatment Plan Summary: Patient reviewed with Dr. Hampton Abbot. Daily contact with patient to assess and evaluate symptoms and progress in treatment  Current medications: -Acetaminophen 650 mg every 6 as needed/mild pain -Maalox 30 mL oral every 4 as needed/digestion -Hydroxyzine 25 mg 3 times daily as needed/anxiety -Magnesium hydroxide 30 mL daily as needed/mild constipation -Trazodone 50 mg nightly as needed/sleep Restarted home medications including: -Duloxetine 30 mg daily -Gabapentin 600 mg twice daily -Pantoprazole 40 mg daily -Sumatriptan hand 50 mg twice daily as needed/migraine headache  Labria will remain in observation area at [facility] behavioral health.  She will be reassessed on 01/18/2022, disposition will be determined at that time.  She remains voluntary at this time.   Lucky Rathke, FNP 01/17/2022 10:56 AM

## 2022-01-17 NOTE — ED Notes (Signed)
Patient is asleep at present time.  No complaints or distress.  Will monitor.

## 2022-01-17 NOTE — ED Notes (Signed)
Pt is in the bed sleeping. Respirations is even and unlabored. No acute distress noted. Will continue to monitor for safety.

## 2022-01-18 MED ORDER — DULOXETINE HCL 30 MG PO CPEP: 30.0000 mg | ORAL_CAPSULE | Freq: Every day | ORAL | 0 refills | Status: DC

## 2022-01-18 NOTE — ED Notes (Signed)
Pt sleeping at present, no distress noted, monitoring for safety. 

## 2022-01-18 NOTE — ED Notes (Signed)
Pt stated, "No. I am not suicidal now. I have been able to change the living situation and go to [place]. I would like to talk to the doctor about leaving". Informed Pt that the providers will be making rounds shortly. Gave a [employer] bar for breakfast. Pt accepted morning meds w/o difficulty. Safety maintained and will continue to monitor.

## 2022-01-18 NOTE — ED Notes (Signed)
Discharge instructions provided and Pt stated understanding. Pt alert, orient and ambulatory prior to d/c from facility. Personal belongings returned from locker number 25. Escorted Pt to the front lobby to d/c from facility. Safety maintained.

## 2022-01-18 NOTE — ED Provider Notes (Signed)
FBC/OBS ASAP Discharge Summary  Date and Time: 01/18/2022 11:43 AM  Name: Briana Wyatt  MRN:  [id number]   Discharge Diagnoses:  Final diagnoses:  Severe episode of recurrent major depressive disorder, without psychotic features ([place])  Suicidal ideation    Subjective:  Briana Wyatt reported " I am feeling and doing a lot better today, I can go live with my friend in [place]."   Stay Summary: [name] was seen and evaluated face-to-face by this provider.  She is denying suicidal or homicidal ideations.  Denies auditory visual hallucinations.  Patient is requesting a discharge states she has secured housing in [place].  States that was her main stressors and reason for this and admission.  States she has follow-up appointments for therapy and psychiatry.  No documented concerning behaviors while on the unit.  Patient was initiated on Cymbalta we will make prescription available x30 days.  Patient to be discharged, support encouragement and  reassurance was provided.  Per admission assessment note:"Briana Wyatt is a 51 year old female with past psychiatric history of major depressive disorder and crack cocaine use disorder presenting to [facility] voluntarily with chief complaint of suicidal ideations by asphyxiation. Patient reports she was planning to put a tube from her exhaust pipe to her window car and sit in her mothers parking lot and die.  Patient reports triggers of frequent arguments with her mother and sister and significant pain and medical issues. Patient is crying, holding the back of her neck reporting being in pain and states if she was home with this type of pain "I would cut my neck". 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   Hypertension    Restless leg syndrome    Sciatica of right  side     Past Surgical History:  Procedure Laterality Date   CARPAL TUNNEL RELEASE Bilateral    ECTOPIC PREGNANCY SURGERY     SHOULDER SURGERY Right    TUBAL LIGATION     Family History:  Family History  Problem Relation Age of Onset   Hypertension Father    Stroke Father    Family Psychiatric History:  Social History:  Social History   Substance and Sexual Activity  Alcohol Use No     Social History   Substance and Sexual Activity  Drug Use Not Currently   Types: Marijuana, Cocaine   Comment: denies use 10/02/21    Social History   Socioeconomic History   Marital status: Single    Spouse name: Not on file   Number of children: 3   Years of education: Not on file   Highest education level: GED or equivalent  Occupational History   Not on file  Tobacco Use   Smoking status: Former    Types: Cigarettes    Quit date: 08/25/1995    Years since quitting: 26.4   Smokeless tobacco: Never  Vaping Use   Vaping Use: Never used  Substance and Sexual Activity   Alcohol use: No   Drug use: Not Currently    Types: Marijuana, Cocaine    Comment: denies use 10/02/21   Sexual activity: Not Currently    Birth control/protection: Surgical  Other Topics Concern   Not on file  Social History Narrative   Not on file   Social Determinants of Health  Financial Resource Strain: Not on file  Food Insecurity: No Food Insecurity   Worried About Charity fundraiser in the Last Year: Never true   Arboriculturist in the Last Year: Never true  Transportation Needs: No Transportation Needs   Lack of Transportation (Medical): No   Lack of Transportation (Non-Medical): No  Physical Activity: Not on file  Stress: Not on file  Social Connections: Not on file   SDOH:  SDOH Screenings   Alcohol Screen: Not on file  Depression (PHQ2-9): Not on file  Financial Resource Strain: Not on file  Food Insecurity: No Food Insecurity   Worried About Running Out of Food in the Last Year: Never  true   Ran Out of Food in the Last Year: Never true  Housing: Not on file  Physical Activity: Not on file  Social Connections: Not on file  Stress: Not on file  Tobacco Use: Medium Risk   Smoking Tobacco Use: Former   Smokeless Tobacco Use: Never   Passive Exposure: Not on file  Transportation Needs: No Transportation Needs   Lack of Transportation (Medical): No   Lack of Transportation (Non-Medical): No    Tobacco Cessation:  N/A, patient does not currently use tobacco products  Current Medications:  Current Facility-Administered Medications  Medication Dose Route Frequency Provider Last Rate Last Admin   acetaminophen (TYLENOL) tablet 650 mg  650 mg Oral Q6H PRN Corky Sox, MD   650 mg at 01/17/22 1758   alum & mag hydroxide-simeth (MAALOX/MYLANTA) 200-200-20 MG/5ML suspension 30 mL  30 mL Oral Q4H PRN Corky Sox, MD       DULoxetine (CYMBALTA) DR capsule 30 mg  30 mg Oral Daily Lucky Rathke, FNP   30 mg at 01/18/22 1023   gabapentin (NEURONTIN) capsule 600 mg  600 mg Oral BID Lucky Rathke, FNP   600 mg at 01/18/22 1023   hydrOXYzine (ATARAX) tablet 25 mg  25 mg Oral TID PRN Corky Sox, MD   25 mg at 01/17/22 2118   magnesium hydroxide (MILK OF MAGNESIA) suspension 30 mL  30 mL Oral Daily PRN Corky Sox, MD       pantoprazole (PROTONIX) EC tablet 40 mg  40 mg Oral Daily Lucky Rathke, FNP   40 mg at 01/18/22 1023   SUMAtriptan (IMITREX) tablet 50 mg  50 mg Oral BID PRN Lucky Rathke, FNP       traZODone (DESYREL) tablet 50 mg  50 mg Oral QHS Bobbitt, Shalon E, NP   50 mg at 01/17/22 2203   Current Outpatient Medications  Medication Sig Dispense Refill   acetaminophen (TYLENOL) 650 MG CR tablet Take 650 mg by mouth every 8 (eight) hours as needed for pain or fever.     albuterol (VENTOLIN HFA) 108 (90 Base) MCG/ACT inhaler INHALE 2 PUFFS BY MOUTH EVERY 6 HOURS AS NEEDED FOR COUGHING, WHEEZING, OR SHORTNESS OF BREATH (Patient not taking: Reported on 01/17/2022)  20.1 g 0   Calcium Carbonate-Vitamin D (CALTRATE 600+D PO) Take by mouth.     cetirizine (ZYRTEC) 10 MG tablet Take 10 mg by mouth daily.     Cholecalciferol (VITAMIN D-3 PO) Take 1 tablet by mouth daily.     [START ON 01/19/2022] DULoxetine (CYMBALTA) 30 MG capsule Take 1 capsule (30 mg total) by mouth daily. 30 capsule 0   gabapentin (NEURONTIN) 300 MG capsule Take 2 capsules (600 mg total) by mouth 2 (two) times daily. (Patient taking differently: Take 900  mg by mouth 2 (two) times daily.) 120 capsule 0   losartan (COZAAR) 50 MG tablet TAKE 1 Tablet BY MOUTH ONCE EVERY DAY 90 tablet 0   Multiple Vitamins-Minerals (CENTRUM SILVER PO) Take 1 tablet by mouth daily.     omeprazole (PRILOSEC) 40 MG capsule Take 40 mg by mouth daily.     SUMAtriptan (IMITREX) 25 MG tablet Take 25 mg by mouth every 2 (two) hours as needed for migraine.     traMADol (ULTRAM) 50 MG tablet TAKE 1 TABLET(50 MG) BY MOUTH EVERY 12 HOURS AS NEEDED 30 tablet 0    PTA Medications: (Not in a hospital admission)   Musculoskeletal  Strength & Muscle Tone: within normal limits Gait & Station: normal Patient leans: N/A  Psychiatric Specialty Exam  Presentation  General Appearance: Casual; Appropriate for Environment  Eye Contact:Fair  Speech:Clear and Coherent; Normal Rate  Speech Volume:Normal  Handedness:Right   Mood and Affect  Mood:Depressed  Affect:Depressed   Thought Process  Thought Processes:Coherent; Goal Directed; Linear  Descriptions of Associations:Intact  Orientation:Full (Time, Place and Person)  Thought Content:Logical; WDL  Diagnosis of Schizophrenia or Schizoaffective disorder in past: No data recorded Duration of Psychotic Symptoms: No data recorded  Hallucinations:Hallucinations: None  Ideas of Reference:None  Suicidal Thoughts:Suicidal Thoughts: denied   Homicidal Thoughts:Homicidal Thoughts: No   Sensorium  Memory:Immediate Good; Recent  Fair  Judgment:Fair  Insight:Present; Shallow   Executive Functions  Concentration:Fair  Attention Span:Fair  [place] of Knowledge:Good  Language:Good   Psychomotor Activity  Psychomotor Activity:Psychomotor Activity: Normal   Assets  Assets:Communication Skills; Desire for Improvement; Financial Resources/Insurance; Housing; Intimacy; Leisure Time; Resilience   Sleep  Sleep:Sleep: Fair   No data recorded  Physical Exam  Physical Exam Vitals and nursing note reviewed.  HENT:     Head: Normocephalic.  Cardiovascular:     Rate and Rhythm: Normal rate and regular rhythm.  Neurological:     General: No focal deficit present.     Mental Status: She is alert and oriented to person, place, and time.  Psychiatric:        Mood and Affect: Mood normal.        Behavior: Behavior normal.   Review of Systems  HENT: Negative.    Eyes: Negative.   Cardiovascular: Negative.   Gastrointestinal: Negative.   Genitourinary: Negative.   Psychiatric/Behavioral:  Positive for depression. Negative for suicidal ideas. The patient is nervous/anxious.   All other systems reviewed and are negative. Blood pressure 118/68, pulse 90, temperature 97.7 F (36.5 C), temperature source Oral, resp. rate 17, SpO2 99 %. There is no height or weight on file to calculate BMI.  Demographic Factors:  Caucasian  Loss Factors: Financial problems/change in socioeconomic status  Historical Factors: Family history of mental illness or substance abuse and Impulsivity  Risk Reduction Factors:   Positive social support and Positive therapeutic relationship  Continued Clinical Symptoms:  Severe Anxiety and/or Agitation  Cognitive Features That Contribute To Risk:  Closed-mindedness    Suicide Risk:  Minimal: No identifiable suicidal ideation.  Patients presenting with no risk factors but with morbid ruminations; may be classified as minimal risk based on the severity of the  depressive symptoms  Plan Of Care/Follow-up recommendations:  Activity:  as tolerated Diet:  heart healthy   Disposition: Take all medications as prescribed. Keep all follow-up appointments as scheduled.  Do not consume alcohol or use illegal drugs while on prescription medications. Report any adverse effects from your medications to your  primary care provider promptly.  In the event of recurrent symptoms or worsening symptoms, call 911, a crisis hotline, or go to the nearest emergency department for evaluation.    Derrill Center, NP 01/18/2022, 11:43 AM

## 2022-01-20 ENCOUNTER — Encounter: Payer: Self-pay | Admitting: Physician Assistant

## 2022-01-20 ENCOUNTER — Ambulatory Visit: Payer: Medicaid Other | Admitting: Physician Assistant

## 2022-01-20 ENCOUNTER — Ambulatory Visit (HOSPITAL_COMMUNITY): Payer: Medicaid Other | Attending: Neurology | Admitting: Physical Therapy

## 2022-01-20 VITALS — BP 122/90 | HR 86 | Temp 97.0°F | Wt 198.0 lb

## 2022-01-20 DIAGNOSIS — K6289 Other specified diseases of anus and rectum: Secondary | ICD-10-CM

## 2022-01-20 DIAGNOSIS — R262 Difficulty in walking, not elsewhere classified: Secondary | ICD-10-CM

## 2022-01-20 DIAGNOSIS — M545 Low back pain, unspecified: Secondary | ICD-10-CM

## 2022-01-20 DIAGNOSIS — M4716 Other spondylosis with myelopathy, lumbar region: Secondary | ICD-10-CM | POA: Insufficient documentation

## 2022-01-20 DIAGNOSIS — F32A Depression, unspecified: Secondary | ICD-10-CM

## 2022-01-20 DIAGNOSIS — K649 Unspecified hemorrhoids: Secondary | ICD-10-CM

## 2022-01-20 DIAGNOSIS — F419 Anxiety disorder, unspecified: Secondary | ICD-10-CM

## 2022-01-20 NOTE — Patient Instructions (Signed)

## 2022-01-20 NOTE — Therapy (Signed)
OUTPATIENT PHYSICAL THERAPY THORACOLUMBAR TREATMENT   Patient Name: Briana Wyatt MRN: [id number] DOB:04/18/1971, 51 y.o., female Today's Date: 01/20/2022   PT End of Session - 01/20/22 1524     Visit Number 5    Number of Visits 8    Date for PT Re-Evaluation 01/30/22    Authorization Type self pay    Progress Note Due on Visit 10    PT Start Time 1452    PT Stop Time 1532    PT Time Calculation (min) 40 min    Activity Tolerance Patient tolerated treatment well    Behavior During Therapy [facility] for tasks assessed/performed               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 Balance Issues-Per Deliah Goody MD   THERAPY DIAG:  Low back pain, unspecified back pain laterality, unspecified chronicity, unspecified whether sciatica present  Difficulty in walking, not elsewhere classified  ONSET DATE: 2 weeks ago  SUBJECTIVE:                                                                                                                                                                                           SUBJECTIVE STATEMENT: Pt states she committed herself into behavioral health but then discharged herself as they were not helping her.  Reports a myriad of pain and conditions she is currently dealing with.  Went to primary today  for F/U and she has increased LBP today at 8/10.   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 Yes: NPRS scale: 8/10 Pain location: mid back to low back Pain description: sharp pain Aggravating factors: moving Relieving factors: nothing   PRECAUTIONS: Fall  WEIGHT BEARING RESTRICTIONS No  FALLS:  Has patient fallen in last 6 months?  Yes. Number of falls 4  LIVING ENVIRONMENT: Lives with: lives alone Lives in: Other modified Lucianne Lei Stairs: Yes: External: 2 steps; antique grab bar Has following equipment at home: Single point cane, Walker - 4 wheeled, shower chair, and Grab bars  OCCUPATION: disability  PLOF: Independent with basic ADLs  PATIENT GOALS less pain   OBJECTIVE:   DIAGNOSTIC FINDINGS:  CLINICAL DATA:  Fell landing flat on back, back pain   EXAM: CT THORACIC AND LUMBAR SPINE WITHOUT CONTRAST    FINDINGS: CT THORACIC SPINE FINDINGS   Alignment: Normal   Vertebrae: Vertebral body heights maintained. Disc space heights fairly well maintained. Scattered endplate spur formation lower thoracic spine. No fracture, subluxation, or bone destruction.   Paraspinal and other soft tissues: Paraspinal soft tissues unremarkable. Normal adrenal glands. Small cluster of cysts with minimal soft tissue at LEFT renal bed question multi-cystic dysplastic kidney, largest cyst 2.7 cm diameter. Remaining paraspinal soft tissues unremarkable.   Disc levels: No obvious disc herniation or intraspinal mass   CT LUMBAR SPINE FINDINGS   Segmentation: Normal   Alignment: Normal   Vertebrae: Vertebral body and disc space heights maintained. No fracture, subluxation, or bone destruction. SI joints unremarkable.   Paraspinal and other soft tissues: No additional abnormalities   Disc levels: Probable calcified disc  fragment centrally at L4-L5. Bulging disc L3-L4.   IMPRESSION: CT THORACIC SPINE IMPRESSION   No acute thoracic spine abnormalities.   Small cluster of cysts with minimal soft tissue at LEFT renal fossa question multi-cystic dysplastic kidney.   CT LUMBAR SPINE IMPRESSION   No acute lumbar spine abnormalities.   Question did calcified disc fragment L4-L5.  CLINICAL DATA:  Hit her head while getting out of and and slipped and fell landing on her back, back pain   EXAM: CT HEAD WITHOUT CONTRAST   FINDINGS: CT HEAD FINDINGS   Brain: Normal ventricular morphology. No midline shift or mass effect. Minimal small vessel chronic ischemic changes of deep cerebral white matter. Otherwise normal appearance of brain parenchyma. No intracranial hemorrhage, mass lesion, or evidence of acute infarction. No extra-axial fluid collections.   Vascular: No hyperdense vessels.   Skull: Calvaria intact   Sinuses/Orbits: Clear   Other: N/A   CT CERVICAL SPINE FINDINGS   Alignment: Normal   Skull base and vertebrae: Osseous mineralization normal. Vertebral body and disc space heights maintained. No fracture, subluxation, or bone destruction.   Soft tissues and spinal canal: Prevertebral soft tissues normal thickness. RIGHT thyroid nodule 3.5 x 2.6 cm; recommend follow-up non emergent thyroid US (ref: J Am Coll Radiol. 2015 Feb;12(2): 143-50). No additional abnormalities.   Disc levels:  Unremarkable   Upper chest: Lung apices clear   Other: N/A   IMPRESSION: Minimal small vessel chronic ischemic changes of deep cerebral white matter.   No acute intracranial abnormalities.   No acute cervical spine abnormalities.   RIGHT thyroid nodule 3.5 x 2.6 cm; follow-up non emergent thyroid ultrasound recommended as above.  PATIENT SURVEYS:  FOTO 26   COGNITION:  Overall cognitive status: Within functional limits for tasks assessed and anxious during eval   Figeting during eval  states she has movement disorder    SENSATION: Numbness from right elbow to right hand; left hand numbness  POSTURE:  guarded  PALPATION: Tender all spinous processes Thoracic and lumbar spine  LUMBAR ROM:   Active  AROM  01/07/2022  Flexion 20%  Extension To neutral  Right lateral flexion 15degrees  Left lateral flexion 24 degrees  Right rotation 15 degrees*  Left rotation 18 degrees   (Blank rows = not tested) **unable to finish testing due to complaints of lightheadedness   LE MMT:   MMT Right 01/07/2022 Left 01/07/2022  Hip flexion 4 4-  Hip extension 4 3+ *  Hip abduction 4 3+* cam lesion /laberal tear   Hip adduction 4 4  Hip internal rotation    Hip external rotation    Knee flexion 4 4  Knee extension 4 4  Ankle dorsiflexion 4 4  Ankle plantarflexion    Ankle inversion    Ankle eversion     (Blank rows = not tested) Done in sitting due to patient compliant of lightheadness with standing  FUNCTIONAL TESTS:  30 seconds chair stand test x 1 today (lightheadedness limits testing) 01/09/22 9 sit to stands in 30 seconds   GAIT: Distance walked: 50 Assistive device utilized: None Level of assistance: CGA Comments: patient reports she is lightheaded; therapist gives her CGA for safety.      TODAY'S TREATMENT  01/15/22:  Standing: Postural awareness UE flexion against wall   Chin tuck against wall 5x 3"  Seated:   Wback 10x  Supine:   Decompression 1-4 10 reps   TBand decompression exercises 1-5 with RTB 5X each  Sidelying   Hip abduction 5 reps each   Clamshell 5X3" holds each   01/15/22:  Standing: Postural awareness  Chin tuck against wall 5x 3"  Wback 7x  Supine:  Decompression 1-4 with RTB 10 reps  STM Thoracic lumbar prone   01/09/22  Assessed five time sit to stand  Decompression exercises 1-5 3 sec holds x10 reps  Hamstring stretch with strap 10-15 second holds x4 b/l  PB LTR x40  Supine marches x40  Seated PB rolls anterior with  seated trunk extensions  oncoming back up x3 mins Horizontal ER with red tband x10 Rows red tband 2x10  Pulley UE x2 mins STM Thoracic lumbar region x15 mins      01/07/22  MMTof LE and Lumbar ROM  Re review of TA contraction, bridge and LTR 2x10 each  PB DKTC 2x10  SLKTC and hold with use of towel for assistance to hold on left x10 sec hold x3 reps  Right hip manual distractionx 2 mins  PB seated ball roll anterior x10 reps  Standing lat pull down red theraband 2x10  STM Thoracic lumbar region x15 mins Diaphragmatic breathing  Physical therapy evaluation, plan of care   BP 80/60 Right arm PATIENT EDUCATION:  Education details: updated HEP Person educated: Patient Education method: Explanation Education comprehension: verbalized understanding and needs further education   HOME EXERCISE PROGRAM:  01/09/22  Decompression exercises 1-5    Access Code: [email or web address] URL: [email or web address] Date: 12/30/2021 Prepared by: AP - Rehab  Exercises - Supine Lower Trunk Rotation  - 2 x daily - 7 x weekly - 1 sets - 10 reps - Supine Bridge  - 2 x daily - 7 x weekly - 1 sets - 10 reps - Supine Transversus Abdominis Bracing - Hands on Stomach  - 2 x daily - 7 x weekly - 1 sets - 10 reps  ASSESSMENT:  CLINICAL IMPRESSION: Pt walking forward bent, fidgety, could not get comfortable in any position.  Shifting to Lt when [email or web address] importance of posture for pain control. Reports her memory is bad and she cannot remember to improve her posture.  States she also forgets her exercises and where they are and what to do so  she is not compliant with HEP. Continued with established therex.  Initially, Pt took constant redirection and she gets off focus discussing other issues not related to therapy.  Discussed posting her exercises in a location in her home where she would see them and remember to complete these each day.  Pt reports she has no where.  Continued with standing  postural strengthening exercises.  Added supine hip strengthening with more difficulty completing on Lt than Rt.  Pt will continue to benefit from skilled physical therapy to improve her compliance with self care and reduce deficits contributing to dysfunction.  OBJECTIVE IMPAIRMENTS Abnormal gait, decreased activity tolerance, decreased balance, decreased endurance, decreased knowledge of condition, decreased knowledge of use of DME, decreased mobility, difficulty walking, decreased ROM, decreased strength, decreased safety awareness, dizziness, hypomobility, impaired flexibility, impaired sensation, and pain.   ACTIVITY LIMITATIONS cleaning, community activity, driving, meal prep, laundry, medication management, yard work, shopping, and school.   PERSONAL FACTORS Age, Behavior pattern, and Fitness are also affecting patient's functional outcome.    REHAB POTENTIAL: Good  CLINICAL DECISION MAKING: Stable/uncomplicated  EVALUATION COMPLEXITY: Low   GOALS: Goals reviewed with patient? No  SHORT TERM GOALS: Target date: 02/03/2022   patient will be independent with initial HEP  Baseline: Goal status: Ongoing  2.  Patient will improve lumbar AROM flexion to 50% and extension to 25% to improve functional mobility in home.  Baseline:  Goal status: Ongoing   LONG TERM GOALS: Target date: 02/17/2022   Patient will be independent with advanced HEP and self management strategies to improve quality of life and functional outcomes.  Baseline:  Goal status: Ongoing  2.   Patient will improve FOTO score to predicted value to demonstrate improved functional mobility. Baseline: 26 Goal status: Ongoing  3.  Patient will report at least 50% improvement in overall symptoms and/or function to demonstrate improved functional mobility  Baseline:  Goal status: Ongoing  4.  Patient will be able to demonstrate pain free lumbar motion in all tested directions 70% of range available Baseline:  Painful flexion and extension  Goal status: Ongoing  5.   Active  AROM  12/30/2021  Flexion 20%  Extension To neutral  Right lateral flexion   Left lateral flexion   Right rotation   Left rotation    Baseline:  Goal status: Ongoing   PLAN: PT FREQUENCY: 2x/week  PT DURATION: 4 weeks  PLANNED INTERVENTIONS: Therapeutic exercises, Therapeutic activity, Neuromuscular re-education, Balance training, Gait training, Patient/Family education, Joint manipulation, Joint mobilization, Stair training, Vestibular training, Canalith repositioning, Visual/preceptual remediation/compensation, Orthotic/Fit training, DME instructions, Aquatic Therapy, Dry Needling, Electrical stimulation, Spinal manipulation, Spinal mobilization, Cryotherapy, Moist heat, Taping, Traction, Ultrasound, and Manual therapy.  PLAN FOR NEXT SESSION: continue to work on spine mobility and core strenghtening exercises. F/U on HEP compliance.  Teena Irani, PTA/CLT [place] Ph: [phone] 3:26 PM, 01/20/22

## 2022-01-20 NOTE — Progress Notes (Unsigned)
BP 122/90   Pulse 86   Temp (!) 97 F (36.1 C)   Wt 198 lb (89.8 kg)   SpO2 97%   BMI 31.96 kg/m    Subjective:    Patient ID: Briana Wyatt, female    DOB: January 27, 1971, 51 y.o.   MRN: [id number]  HPI: Briana Wyatt is a 51 y.o. female presenting on 01/20/2022 for Follow-up   HPI  She went to behavioral hospital last week and they added cymbalta.  She says she stayed 2 nights.   She says mentally she feels "fried".    She says her mother and her sister are her biggest problems.   She is seeing psychiatrist at [facility] and wants to get rescheduled with counselor here.    Pain is improved from her fall (when seen here 12/16/21)  She has appt with neurosurgery next month (8/24)  for neck & back problems.    She says neurologist seen last week and referred her to neurosurgery.  She has follow up with neurology 6/28.    She was rx imitrex and her gabapentin was increased to '900mg'$  bid, and tizanidine.   She is ordered for coronary CT in June  She has nerve conduction study in June also  She has appointment with a rheumatologist soon also.   She requests referral to proctotologist.  She Feels like she is "pooping thru a hemorrhoid".  She sometimes has bleeding.  She feels like there is a mass or lump when she moves her bowels.    She has pain.  She had negative FIT test in February.    She says she needs a twin size mattress.  She says hers got wet due to a leak in her camper.    Relevant past medical, surgical, family and social history reviewed and updated as indicated. Interim medical history since our last visit reviewed. Allergies and medications reviewed and updated.   Current Outpatient Medications:    acetaminophen (TYLENOL) 650 MG CR tablet, Take 650 mg by mouth every 8 (eight) hours as needed for pain or fever., Disp: , Rfl:    albuterol (VENTOLIN HFA) 108 (90 Base) MCG/ACT inhaler, INHALE 2 PUFFS BY MOUTH EVERY 6 HOURS AS NEEDED FOR COUGHING, WHEEZING, OR  SHORTNESS OF BREATH, Disp: 20.1 g, Rfl: 0   Calcium Carbonate-Vitamin D (CALTRATE 600+D PO), Take by mouth., Disp: , Rfl:    cetirizine (ZYRTEC) 10 MG tablet, Take 10 mg by mouth daily., Disp: , Rfl:    Cholecalciferol (VITAMIN D-3 PO), Take 1 tablet by mouth daily., Disp: , Rfl:    DULoxetine (CYMBALTA) 30 MG capsule, Take 1 capsule (30 mg total) by mouth daily., Disp: 30 capsule, Rfl: 0   gabapentin (NEURONTIN) 300 MG capsule, Take 2 capsules (600 mg total) by mouth 2 (two) times daily. (Patient taking differently: Take 900 mg by mouth 2 (two) times daily.), Disp: 120 capsule, Rfl: 0   losartan (COZAAR) 50 MG tablet, TAKE 1 Tablet BY MOUTH ONCE EVERY DAY, Disp: 90 tablet, Rfl: 0   Multiple Vitamins-Minerals (CENTRUM SILVER PO), Take 1 tablet by mouth daily., Disp: , Rfl:    omeprazole (PRILOSEC) 40 MG capsule, Take 40 mg by mouth daily., Disp: , Rfl:    SUMAtriptan (IMITREX) 25 MG tablet, Take 25 mg by mouth every 2 (two) hours as needed for migraine., Disp: , Rfl:    traMADol (ULTRAM) 50 MG tablet, TAKE 1 TABLET(50 MG) BY MOUTH EVERY 12 HOURS AS NEEDED, Disp: 30 tablet, Rfl:  0     Review of Systems  Per HPI unless specifically indicated above     Objective:    BP 122/90   Pulse 86   Temp (!) 97 F (36.1 C)   Wt 198 lb (89.8 kg)   SpO2 97%   BMI 31.96 kg/m   Wt Readings from Last 3 Encounters:  01/20/22 198 lb (89.8 kg)  01/06/22 197 lb (89.4 kg)  12/22/21 197 lb (89.4 kg)    Physical Exam Vitals reviewed. Exam conducted with a chaperone present.  Constitutional:      General: She is not in acute distress.    Appearance: She is well-developed. She is not toxic-appearing.  HENT:     Head: Normocephalic and atraumatic.  Cardiovascular:     Rate and Rhythm: Normal rate and regular rhythm.  Pulmonary:     Effort: Pulmonary effort is normal.     Breath sounds: Normal breath sounds.  Abdominal:     General: Bowel sounds are normal.     Palpations: Abdomen is soft. There  is no mass.     Tenderness: There is no abdominal tenderness.  Genitourinary:    Rectum: Tenderness and external hemorrhoid present.     Comments: (Nurse Berenice assisted) Musculoskeletal:     Cervical back: Neck supple.     Right lower leg: No edema.     Left lower leg: No edema.  Lymphadenopathy:     Cervical: No cervical adenopathy.  Skin:    General: Skin is warm and dry.  Neurological:     Mental Status: She is alert and oriented to person, place, and time.  Psychiatric:        Behavior: Behavior normal.          Assessment & Plan:    Encounter Diagnoses  Name Primary?   Depression, unspecified depression type Yes   Anxiety    Hemorrhoids, unspecified hemorrhoid type    Rectal pain      -Refer to GI for sensation of rectal mass.  She is counseled on increasing the fiber in her diet, increasing her water intake and using OTC hemorrhoid treatments.  She was given reading information on hemorrhoids -pt was rescheduled with [facility] -pt to follow up 6 weeks.  She is to contact office sooner prn

## 2022-01-21 ENCOUNTER — Ambulatory Visit: Payer: Medicaid Other | Admitting: Licensed Clinical Social Worker

## 2022-01-21 ENCOUNTER — Encounter: Payer: Self-pay | Admitting: Physician Assistant

## 2022-01-21 DIAGNOSIS — F419 Anxiety disorder, unspecified: Secondary | ICD-10-CM

## 2022-01-21 DIAGNOSIS — F32A Depression, unspecified: Secondary | ICD-10-CM

## 2022-01-21 IMAGING — DX DG CHEST 2V
2 series · 2 of 2 positions shown · non-contrast
Comparison: None.

CLINICAL DATA: Short of breath, back pain

EXAM:
CHEST - 2 VIEW

[chest pa]
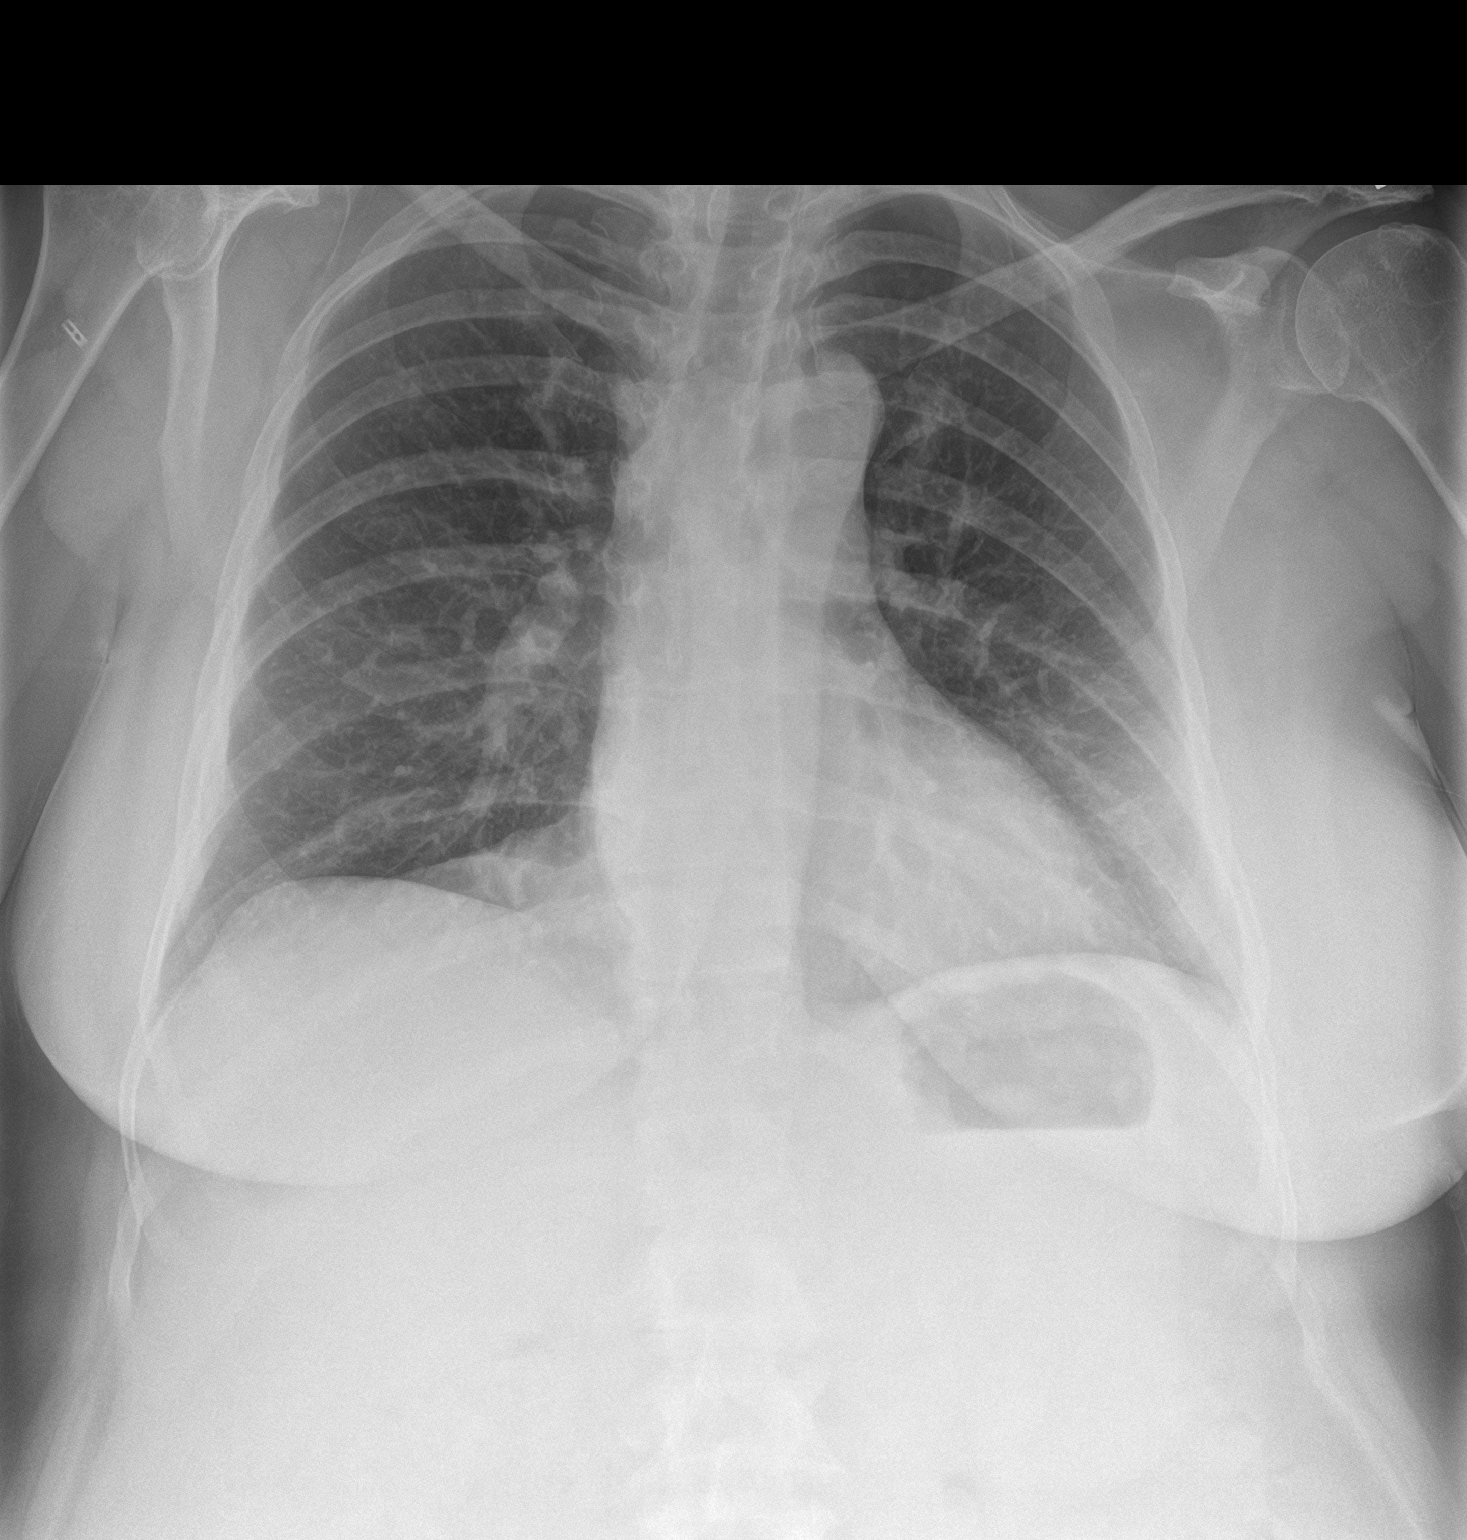

[chest lat]
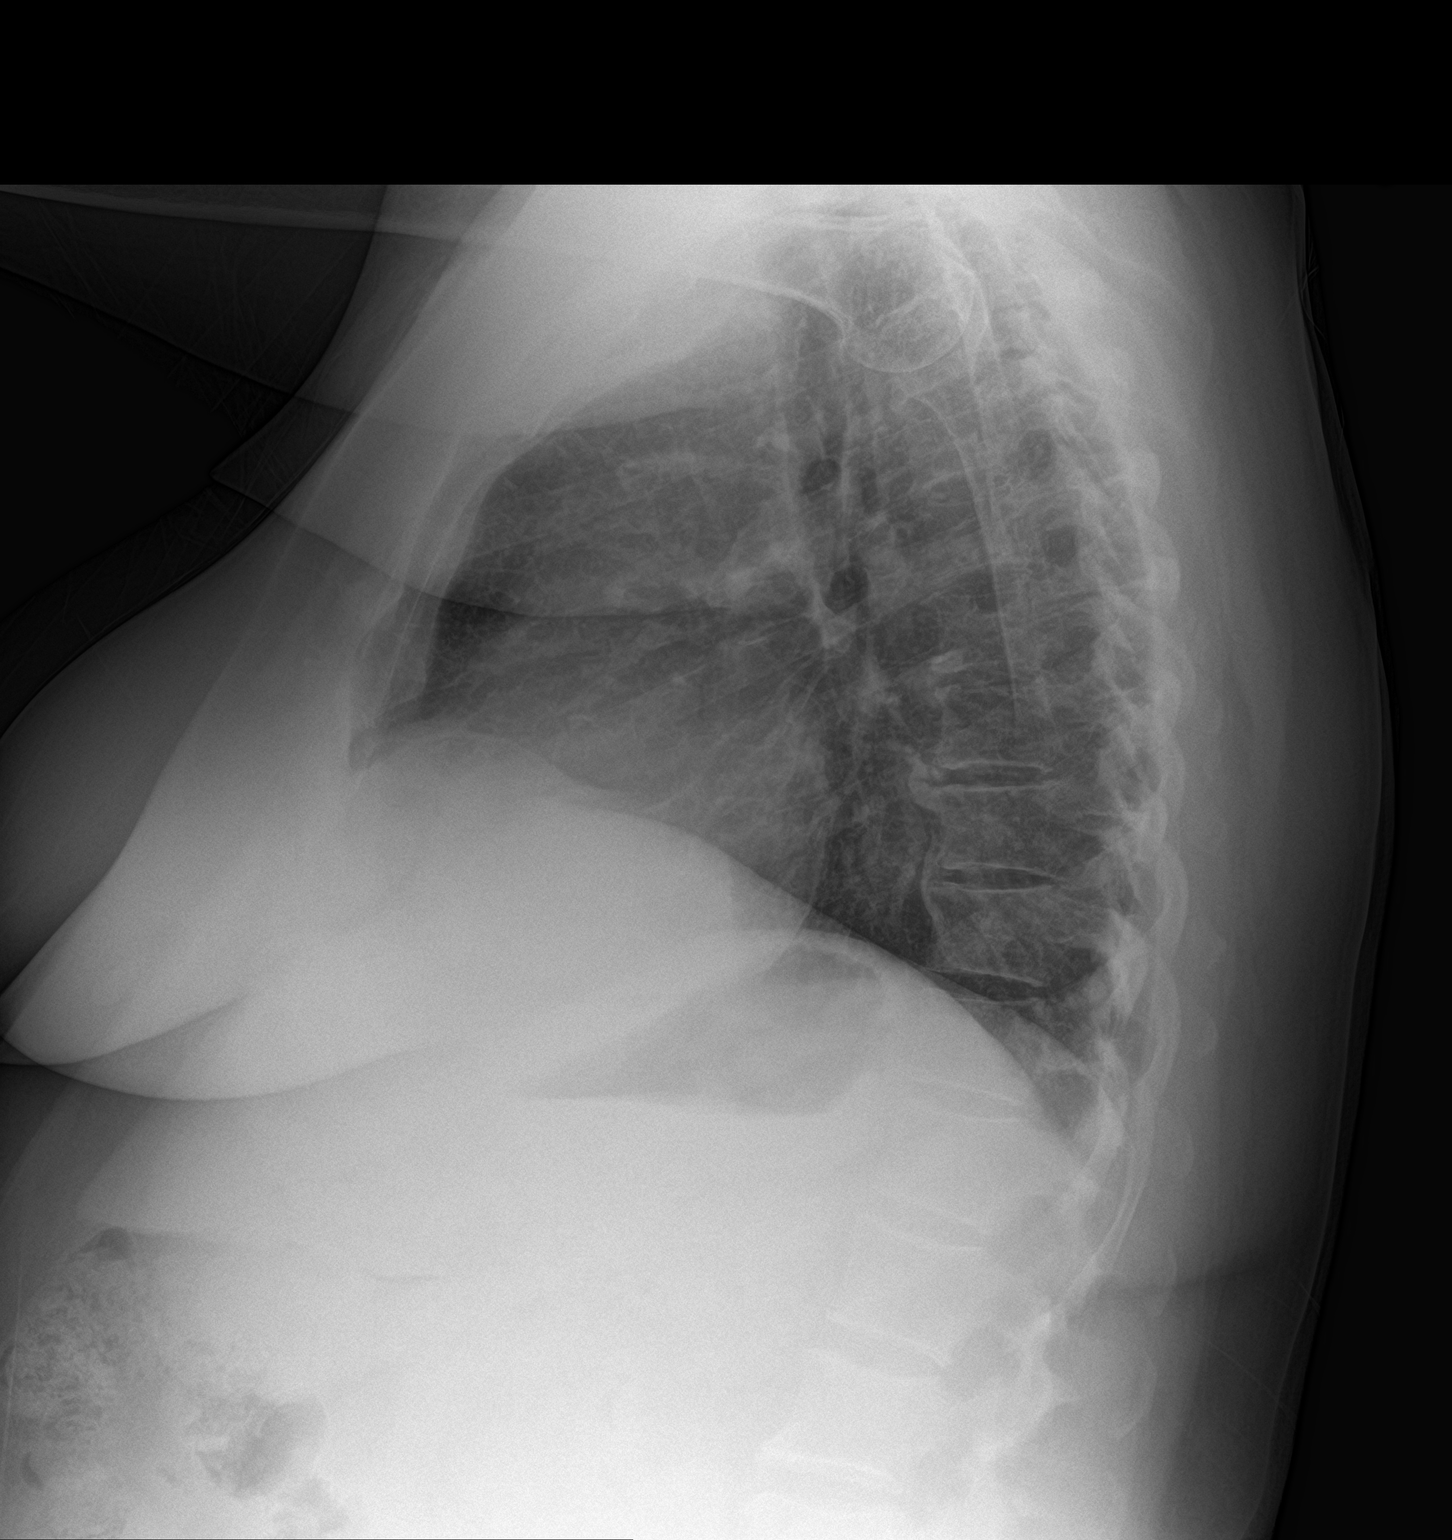

[2 of 2 positions shown; findings below may reference images not displayed]

FINDINGS: Normal mediastinum and cardiac silhouette. Normal pulmonary
vasculature. No evidence of effusion, infiltrate, or pneumothorax.
No acute bony abnormality. Degenerative osteophytosis of the spine.
IMPRESSION: No active cardiopulmonary disease.

## 2022-01-21 NOTE — Progress Notes (Signed)
[facility] engaged patient in follow-up appointment. [facility] provided active listening and validation as patient shared about frustrations related to interpersonal conflict and about recent stay in hospital due to SI/HI. [facility] commended patient for committing herself and reviewed safety planning for future situations. Patient has protective factors of "wanting to finish writing poetry book" and "not wanting to go to hell." Patient verbally contracted that she would call 911 or take herself to ER, if she has active SI/HI in the future. Follow-up appointment scheduled for one week.

## 2022-01-22 ENCOUNTER — Encounter (HOSPITAL_COMMUNITY): Payer: Medicaid Other | Admitting: Physical Therapy

## 2022-01-22 ENCOUNTER — Encounter (INDEPENDENT_AMBULATORY_CARE_PROVIDER_SITE_OTHER): Payer: Self-pay | Admitting: *Deleted

## 2022-01-26 ENCOUNTER — Encounter (HOSPITAL_COMMUNITY): Payer: Medicaid Other | Admitting: Physical Therapy

## 2022-01-28 ENCOUNTER — Ambulatory Visit: Payer: Medicaid Other | Admitting: Licensed Clinical Social Worker

## 2022-01-29 ENCOUNTER — Telehealth: Payer: Self-pay | Admitting: Internal Medicine

## 2022-01-29 ENCOUNTER — Ambulatory Visit: Payer: Self-pay | Admitting: Internal Medicine
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
## 2022-01-29 NOTE — Telephone Encounter (Signed)
Okay to reschedule one time. Please make patient aware if she no shows or cancels appointment she will not be rescheduled. Thanks!

## 2022-01-29 NOTE — Telephone Encounter (Signed)
Patient called the office requesting to reschedule her appointment she she no-showed this morning. Patient states she took too much melatonin and slept all day.

## 2022-01-30 ENCOUNTER — Other Ambulatory Visit: Payer: Self-pay

## 2022-01-30 ENCOUNTER — Inpatient Hospital Stay (HOSPITAL_COMMUNITY): Payer: Medicaid Other | Attending: Hematology | Admitting: *Deleted

## 2022-01-30 ENCOUNTER — Telehealth (HOSPITAL_COMMUNITY): Payer: Self-pay | Admitting: *Deleted

## 2022-01-30 DIAGNOSIS — Z124 Encounter for screening for malignant neoplasm of cervix: Secondary | ICD-10-CM

## 2022-01-30 DIAGNOSIS — Z1211 Encounter for screening for malignant neoplasm of colon: Secondary | ICD-10-CM

## 2022-01-30 MED ORDER — METOPROLOL TARTRATE 100 MG PO TABS: ORAL_TABLET | ORAL | 0 refills | Status: DC

## 2022-01-30 NOTE — Progress Notes (Signed)
Patient: Briana Wyatt           Date of Birth: 11-08-1970           MRN: [id number] Visit Date: 01/30/2022 PCP: Soyla Dryer, PA-C  Cervical Cancer Screening Do you smoke?: No Have you ever had or been told you have an allergy to latex products?: No Marital status: Divorce Date of last pap smear: More than 5 yrs ago (03/01/2013-Negative ([facility])) Number of pregnancies: 4 Number of births: 3 Have you ever had any of the following? Hysterectomy: No Tubal ligation (tubes tied): No Abnormal bleeding: Yes Abnormal pap smear: No Venereal warts: No A sex partner with venereal warts: No A high risk* sex partner: No  Cervical Exam  Abnormal Observations: Thick white to yellowish colored discharge in vagina. Wet prep completed. Patient complained of right ovary pain. Sent referral to the [facility]. Appointment scheduled Friday, February 20, 2022 at 1050. Recommendations: Last Pap smear was in June 2020 at [facility] clinic in  [place] and was normal per patient. Per patient has no history of an abnormal Pap smear. Last Pap smear result is not available in Epic. Let patient know if today's Pap smear is normal and HPV negative that her next Pap smear is due in 5 years per BCCCP guidelines. Informed patient will follow up with her within the next week with results of her wet prep and Pap smear by phone. Patient verbalized understanding.      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

## 2022-01-30 NOTE — Telephone Encounter (Signed)
Reaching out to patient to offer assistance regarding upcoming cardiac imaging study; pt verbalizes understanding of appt date/time, parking situation and where to check in, pre-test NPO status and medications ordered, and verified current allergies; name and call back number provided for further questions should they arise ? ?Briana Accardo RN Navigator Cardiac Imaging ?[facility] ?[phone] office ?[phone] cell ? ?Patient to take 100mg metoprolol tartrate two hours prior to her cardiac CT scan.  She is aware to arrive at 1:30pm. ? ?

## 2022-01-31 ENCOUNTER — Other Ambulatory Visit (HOSPITAL_COMMUNITY): Payer: Self-pay | Admitting: Internal Medicine

## 2022-02-02 ENCOUNTER — Other Ambulatory Visit: Payer: Self-pay

## 2022-02-02 ENCOUNTER — Telehealth: Payer: Self-pay

## 2022-02-02 ENCOUNTER — Ambulatory Visit (HOSPITAL_COMMUNITY)
Admission: RE | Admit: 2022-02-02 | Discharge: 2022-02-02 | Disposition: A | Discharge status: Home / Self Care | Payer: Medicaid Other | Source: Ambulatory Visit | Attending: Internal Medicine | Admitting: Internal Medicine

## 2022-02-02 DIAGNOSIS — I2510 Atherosclerotic heart disease of native coronary artery without angina pectoris: Secondary | ICD-10-CM

## 2022-02-02 LAB — CERVICOVAGINAL ANCILLARY ONLY
Bacterial Vaginitis (gardnerella): POSITIVE — AB
Candida Glabrata: NEGATIVE
Candida Vaginitis: NEGATIVE
Comment: NEGATIVE
Comment: NEGATIVE
Comment: NEGATIVE
Comment: NEGATIVE
Trichomonas: NEGATIVE

## 2022-02-02 LAB — CYTOLOGY - PAP
Adequacy: ABSENT
Comment: NEGATIVE
Diagnosis: NEGATIVE
High risk HPV: NEGATIVE

## 2022-02-02 MED ORDER — METRONIDAZOLE 500 MG PO TABS: 500.0000 mg | ORAL_TABLET | Freq: Two times a day (BID) | ORAL | 0 refills | Status: DC

## 2022-02-02 MED ORDER — NITROGLYCERIN 0.4 MG SL SUBL
0.8000 mg | SUBLINGUAL_TABLET | Freq: Once | SUBLINGUAL | Status: AC
Start: 2022-02-02 — End: 2022-02-02
  Administered 2022-02-02: 0.8 mg via SUBLINGUAL

## 2022-02-02 MED ORDER — IOHEXOL 350 MG/ML SOLN
100.0000 mL | Freq: Once | INTRAVENOUS | Status: AC | PRN
Start: 2022-02-02 — End: 2022-02-02
  Administered 2022-02-02: 100 mL via INTRAVENOUS

## 2022-02-02 MED ORDER — NITROGLYCERIN 0.4 MG SL SUBL
SUBLINGUAL_TABLET | SUBLINGUAL | Status: AC
  Filled 2022-02-02: qty 2

## 2022-02-02 NOTE — Telephone Encounter (Addendum)
Patient returned call, and was informed Wet prep results were positive for bacterial vaginitis, needs rx metronidazole sent to pharmacy. Patient verbalized understanding. [facility] ([place], [place])    Attempted to contact patient regarding lab results. Left message on identifying voicemail requesting a return call.

## 2022-02-03 ENCOUNTER — Ambulatory Visit: Payer: Medicaid Other | Admitting: Licensed Clinical Social Worker

## 2022-02-03 ENCOUNTER — Encounter: Payer: Medicaid Other | Admitting: Physical Medicine and Rehabilitation

## 2022-02-04 ENCOUNTER — Encounter: Payer: Self-pay | Admitting: Physical Medicine and Rehabilitation

## 2022-02-04 ENCOUNTER — Ambulatory Visit (INDEPENDENT_AMBULATORY_CARE_PROVIDER_SITE_OTHER): Payer: Self-pay | Admitting: Physical Medicine and Rehabilitation

## 2022-02-04 DIAGNOSIS — R202 Paresthesia of skin: Secondary | ICD-10-CM

## 2022-02-04 NOTE — Progress Notes (Signed)
Pt right hand numbness and pain that travels to her pinky, ring, middle and index finger. Pt state the pain goes up to her palm, forearm and elbow. Pt state she has trouble pick up and holding items. Pt state she has to hold and balance her plate on the backside of her hand. Pt state the pain keeps her up at night.Pt state she takes pain meds to help ease the pain. Pt state she left handed.  Numeric Pain Rating Scale and Functional Assessment Average Pain 7   In the last MONTH (on 0-10 scale) has pain interfered with the following?  1. General activity like being  able to carry out your everyday physical activities such as walking, climbing stairs, carrying groceries, or moving a chair?  Rating(10)    -BT, -Dye Allergies.

## 2022-02-06 ENCOUNTER — Telehealth (HOSPITAL_COMMUNITY): Payer: Self-pay

## 2022-02-06 NOTE — Procedures (Signed)
EMG & NCV Findings: Evaluation of the right median (across palm) sensory nerve showed prolonged distal peak latency (Wrist, 4.6 ms) and prolonged distal peak latency (Palm, 4.8 ms).  All remaining nerves (as indicated in the following tables) were within normal limits.    All examined muscles (as indicated in the following table) showed no evidence of electrical instability.    Impression: The above electrodiagnostic study is ABNORMAL and reveals evidence of a mild right median nerve entrapment at the wrist (carpal tunnel syndrome) affecting sensory components.  As her distribution of symptoms is somewhat nondermatomal this could explain some of her hand symptoms. **Cannot rule out sensory predominant ulnar neuropathy at the wrist but ulnor motor conductions are normal.     There is no significant electrodiagnostic evidence of any other focal nerve entrapment, brachial plexopathy or cervical radiculopathy in the right upper limb.   **She does have recent cervical MRI showing severe central stenosis and this may be causing some of the hand symptoms as well this would not necessarily be detected on this electrodiagnostic study if not specifically affecting nerve roots or not causing axonal damage.  Recommendations: 1.  Follow-up with referring physician. 2.  Continue current management of symptoms.  Needs to follow-up with neurosurgery for her cervical spine.  ___________________________ Wonda Olds Board Certified, American Board of Physical Medicine and Rehabilitation    Nerve Conduction Studies Anti Sensory Summary Table   Stim Site NR Peak (ms) Norm Peak (ms) P-T Amp (V) Norm P-T Amp Site1 Site2 Delta-P (ms) Dist (cm) Vel (m/s) Norm Vel (m/s)  Right Median Acr Palm Anti Sensory (2nd Digit)  30.5C  Wrist    *4.6 <3.6 18.4 >10 Wrist Palm 0.2 0.0    Palm    *4.8 <2.0 12.3         Right Radial Anti Sensory (Base 1st Digit)  30.6C  Wrist    2.1 <3.1 20.3  Wrist Base 1st Digit  2.1 0.0    Right Ulnar Anti Sensory (5th Digit)  30.8C  Wrist    3.2 <3.7 33.7 >15.0 Wrist 5th Digit 3.2 14.0 44 >38  B Elbow    6.9  99.6  B Elbow Wrist 3.7 0.0  >47   Motor Summary Table   Stim Site NR Onset (ms) Norm Onset (ms) O-P Amp (mV) Norm O-P Amp Site1 Site2 Delta-0 (ms) Dist (cm) Vel (m/s) Norm Vel (m/s)  Right Median Motor (Abd Poll Brev)  30.7C  Wrist    4.1 <4.2 5.9 >5 Elbow Wrist 3.9 21.0 54 >50  Elbow    8.0  5.7         Right Ulnar Motor (Abd Dig Min)  30.9C  Wrist    3.0 <4.2 8.4 >3 B Elbow Wrist 3.3 19.0 58 >53  B Elbow    6.3  8.8  A Elbow B Elbow 1.5 10.0 67 >53  A Elbow    7.8  7.8          EMG   Side Muscle Nerve Root Ins Act Fibs Psw Amp Dur Poly Recrt Int Fraser Din Comment  Right Abd Poll Brev Median C8-T1 Nml Nml Nml Nml Nml 0 Nml Nml   Right 1stDorInt Ulnar C8-T1 Nml Nml Nml Nml Nml 0 Nml Nml   Right PronatorTeres Median C6-7 Nml Nml Nml Nml Nml 0 Nml Nml   Right Biceps Musculocut C5-6 Nml Nml Nml Nml Nml 0 Nml Nml   Right Deltoid Axillary C5-6 Nml Nml Nml Nml Nml 0  Nml Nml     Nerve Conduction Studies Anti Sensory Left/Right Comparison   Stim Site L Lat (ms) R Lat (ms) L-R Lat (ms) L Amp (V) R Amp (V) L-R Amp (%) Site1 Site2 L Vel (m/s) R Vel (m/s) L-R Vel (m/s)  Median Acr Palm Anti Sensory (2nd Digit)  30.5C  Wrist  *4.6   18.4  Wrist Palm     Palm  *4.8   12.3        Radial Anti Sensory (Base 1st Digit)  30.6C  Wrist  2.1   20.3  Wrist Base 1st Digit     Ulnar Anti Sensory (5th Digit)  30.8C  Wrist  3.2   33.7  Wrist 5th Digit  44   B Elbow  6.9   99.6  B Elbow Wrist      Motor Left/Right Comparison   Stim Site L Lat (ms) R Lat (ms) L-R Lat (ms) L Amp (mV) R Amp (mV) L-R Amp (%) Site1 Site2 L Vel (m/s) R Vel (m/s) L-R Vel (m/s)  Median Motor (Abd Poll Brev)  30.7C  Wrist  4.1   5.9  Elbow Wrist  54   Elbow  8.0   5.7        Ulnar Motor (Abd Dig Min)  30.9C  Wrist  3.0   8.4  B Elbow Wrist  58   B Elbow  6.3   8.8  A Elbow B Elbow  67    A Elbow  7.8   7.8           Waveforms:

## 2022-02-06 NOTE — Telephone Encounter (Signed)
S/w she wants to be d/c.

## 2022-02-06 NOTE — Progress Notes (Signed)
Briana Wyatt - 51 y.o. female MRN [id number]  Date of birth: Feb 12, 1971  Office Visit Note: Visit Date: 02/04/2022 PCP: Soyla Dryer, PA-C Referred by: Mordecai Rasmussen, MD  Subjective: Chief Complaint  Patient presents with   Right Hand - Pain   Right Elbow - Pain   HPI:  Briana Wyatt is a 51 y.o. female who comes in today at the request of Dr. Larena Glassman for electrodiagnostic study of the Right upper extremities.  Patient is Left hand dominant.  She reports chronic worsening severe right hand pain with numbness and tingling in a somewhat nondermatomal pattern through the first second third and fourth digits.  Does not endorse any numbness to the thumb.  She reports symptoms go in a referral pattern from her hand to the forearm to the elbow.  Denies frank radicular symptoms down the arms.  She is having trouble holding objects and picking up objects.  She has to do a lot of balancing with her hand just did not be able to use it at times for normal activities.  She does get nocturnal complaints but it seemingly constant.  She however does have multiple pain complaints with bilateral shoulder pain.  Of actually seen her for shoulder injection in the past.  She recently had left shoulder injected as well.  She has been followed by both rheumatology and neurology.  Most recently MRI of the cervical spine was completed at [facility] showing severe central canal stenosis at C4-5 and C5-6.  This is reviewed below in the notes.  I cannot see the images.  I do not see any electrodiagnostic studies to review.   ROS Otherwise per HPI.  Assessment & Plan: Visit Diagnoses:    ICD-10-CM   1. Paresthesia of skin  R20.2 NCV with EMG (electromyography)      Plan: Impression: The above electrodiagnostic study is ABNORMAL and reveals evidence of a mild right median nerve entrapment at the wrist (carpal tunnel syndrome) affecting sensory components.  As her distribution of symptoms is somewhat  nondermatomal this could explain some of her hand symptoms. **Cannot rule out sensory predominant ulnar neuropathy at the wrist but ulnor motor conductions are normal.     There is no significant electrodiagnostic evidence of any other focal nerve entrapment, brachial plexopathy or cervical radiculopathy in the right upper limb.   **She does have recent cervical MRI showing severe central stenosis and this may be causing some of the hand symptoms as well this would not necessarily be detected on this electrodiagnostic study if not specifically affecting nerve roots or not causing axonal damage.  Recommendations: 1.  Follow-up with referring physician. 2.  Continue current management of symptoms.  Needs to follow-up with neurosurgery for her cervical spine.  Meds & Orders: No orders of the defined types were placed in this encounter.   Orders Placed This Encounter  Procedures   NCV with EMG (electromyography)    Follow-up: Return in about 2 weeks (around 02/18/2022) for Larena Glassman, MD.   Procedures: No procedures performed  EMG & NCV Findings: Evaluation of the right median (across palm) sensory nerve showed prolonged distal peak latency (Wrist, 4.6 ms) and prolonged distal peak latency (Palm, 4.8 ms).  All remaining nerves (as indicated in the following tables) were within normal limits.    All examined muscles (as indicated in the following table) showed no evidence of electrical instability.    Impression: The above electrodiagnostic study is ABNORMAL and reveals evidence of a mild right  median nerve entrapment at the wrist (carpal tunnel syndrome) affecting sensory components.  As her distribution of symptoms is somewhat nondermatomal this could explain some of her hand symptoms. **Cannot rule out sensory predominant ulnar neuropathy at the wrist but ulnor motor conductions are normal.     There is no significant electrodiagnostic evidence of any other focal nerve entrapment,  brachial plexopathy or cervical radiculopathy in the right upper limb.   **She does have recent cervical MRI showing severe central stenosis and this may be causing some of the hand symptoms as well this would not necessarily be detected on this electrodiagnostic study if not specifically affecting nerve roots or not causing axonal damage.  Recommendations: 1.  Follow-up with referring physician. 2.  Continue current management of symptoms.  Needs to follow-up with neurosurgery for her cervical spine.  ___________________________ Wonda [employer]s Board Certified, American Board of Physical Medicine and Rehabilitation    Nerve Conduction Studies Anti Sensory Summary Table   Stim Site NR Peak (ms) Norm Peak (ms) P-T Amp (V) Norm P-T Amp Site1 Site2 Delta-P (ms) Dist (cm) Vel (m/s) Norm Vel (m/s)  Right Median Acr Palm Anti Sensory (2nd Digit)  30.5C  Wrist    *4.6 <3.6 18.4 >10 Wrist Palm 0.2 0.0    Palm    *4.8 <2.0 12.3         Right Radial Anti Sensory (Base 1st Digit)  30.6C  Wrist    2.1 <3.1 20.3  Wrist Base 1st Digit 2.1 0.0    Right Ulnar Anti Sensory (5th Digit)  30.8C  Wrist    3.2 <3.7 33.7 >15.0 Wrist 5th Digit 3.2 14.0 44 >38  B Elbow    6.9  99.6  B Elbow Wrist 3.7 0.0  >47   Motor Summary Table   Stim Site NR Onset (ms) Norm Onset (ms) O-P Amp (mV) Norm O-P Amp Site1 Site2 Delta-0 (ms) Dist (cm) Vel (m/s) Norm Vel (m/s)  Right Median Motor (Abd Poll Brev)  30.7C  Wrist    4.1 <4.2 5.9 >5 Elbow Wrist 3.9 21.0 54 >50  Elbow    8.0  5.7         Right Ulnar Motor (Abd Dig Min)  30.9C  Wrist    3.0 <4.2 8.4 >3 B Elbow Wrist 3.3 19.0 58 >53  B Elbow    6.3  8.8  A Elbow B Elbow 1.5 10.0 67 >53  A Elbow    7.8  7.8          EMG   Side Muscle Nerve Root Ins Act Fibs Psw Amp Dur Poly Recrt Int Fraser Din Comment  Right Abd Poll Brev Median C8-T1 Nml Nml Nml Nml Nml 0 Nml Nml   Right 1stDorInt Ulnar C8-T1 Nml Nml Nml Nml Nml 0 Nml Nml   Right PronatorTeres Median C6-7  Nml Nml Nml Nml Nml 0 Nml Nml   Right Biceps Musculocut C5-6 Nml Nml Nml Nml Nml 0 Nml Nml   Right Deltoid Axillary C5-6 Nml Nml Nml Nml Nml 0 Nml Nml     Nerve Conduction Studies Anti Sensory Left/Right Comparison   Stim Site L Lat (ms) R Lat (ms) L-R Lat (ms) L Amp (V) R Amp (V) L-R Amp (%) Site1 Site2 L Vel (m/s) R Vel (m/s) L-R Vel (m/s)  Median Acr Palm Anti Sensory (2nd Digit)  30.5C  Wrist  *4.6   18.4  Wrist Palm     Palm  *4.8   12.3  Radial Anti Sensory (Base 1st Digit)  30.6C  Wrist  2.1   20.3  Wrist Base 1st Digit     Ulnar Anti Sensory (5th Digit)  30.8C  Wrist  3.2   33.7  Wrist 5th Digit  44   B Elbow  6.9   99.6  B Elbow Wrist      Motor Left/Right Comparison   Stim Site L Lat (ms) R Lat (ms) L-R Lat (ms) L Amp (mV) R Amp (mV) L-R Amp (%) Site1 Site2 L Vel (m/s) R Vel (m/s) L-R Vel (m/s)  Median Motor (Abd Poll Brev)  30.7C  Wrist  4.1   5.9  Elbow Wrist  54   Elbow  8.0   5.7        Ulnar Motor (Abd Dig Min)  30.9C  Wrist  3.0   8.4  B Elbow Wrist  58   B Elbow  6.3   8.8  A Elbow B Elbow  67   A Elbow  7.8   7.8           Waveforms:             Clinical History: EXAM: Magnetic resonance imaging, spinal canal and contents, cervical without and with contrast material.  DATE: 12/09/2021 2:28 PM  ACCESSION: [id number]  DICTATED: 12/09/2021 3:08 PM  INTERPRETATION LOCATION: [place]   CLINICAL INDICATION: 51 years old Female with white matter lesions in the brain. r/o demyelination in the spine  - R90.82 - White matter abnormality on MRI of brain     COMPARISON: None   TECHNIQUE: Multiplanar MRI was performed through the cervical spine without and with intravenous contrast.   FINDINGS:  Bone marrow signal intensity is normal. Normal signal in the spinal cord. There is no abnormal enhancement.   The vertebral bodies are normally aligned. Disc spaces are preserved.   C2-C3: Small right paramedian disc protrusion without spinal  canal stenosis. Uncovertebral arthropathy with mild right foraminal narrowing. No left foraminal stenosis.   C3-C4: Small disc osteophyte complex with mild spinal canal narrowing. Uncovertebral arthropathy with mild bilateral foraminal narrowing.   C4-C5: Disc osteophyte complex with severe spinal canal stenosis. Uncovertebral and facet arthropathy with moderate bilateral foraminal narrowing.   C5-C6: Disc osteophyte complex with severe spinal canal stenosis. Uncovertebral arthropathy with moderate bilateral foraminal narrowing.   C6-C7: Disc osteophyte complex with mild spinal canal narrowing. Uncovertebral arthropathy with moderate bilateral foraminal narrowing.   C7-T1: No spinal canal narrowing. No neural foraminal narrowing.   The paraspinal tissues are within normal limits.   IMPRESSION:  1. No MR evidence of demyelination.   2. 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 Moderate foraminal stenosis involving the bilateral C4-C7 levels Exam End: 12/09/21     Objective:  VS:  HT:    WT:   BMI:     BP:   HR: bpm  TEMP: ( )  RESP:  Physical Exam   Imaging: No results found.

## 2022-02-07 ENCOUNTER — Encounter: Payer: Self-pay | Admitting: Physician Assistant

## 2022-02-10 ENCOUNTER — Ambulatory Visit: Payer: Medicaid Other | Admitting: Physician Assistant

## 2022-02-10 ENCOUNTER — Ambulatory Visit: Payer: Medicaid Other | Admitting: Licensed Clinical Social Worker

## 2022-02-10 ENCOUNTER — Ambulatory Visit: Payer: Medicaid Other | Admitting: Internal Medicine

## 2022-02-10 ENCOUNTER — Encounter: Payer: Self-pay | Admitting: Physician Assistant

## 2022-02-10 VITALS — BP 124/80 | HR 103 | Temp 96.3°F | Ht 66.0 in | Wt 203.0 lb

## 2022-02-10 DIAGNOSIS — F419 Anxiety disorder, unspecified: Secondary | ICD-10-CM

## 2022-02-10 DIAGNOSIS — F32A Depression, unspecified: Secondary | ICD-10-CM

## 2022-02-10 DIAGNOSIS — F489 Nonpsychotic mental disorder, unspecified: Secondary | ICD-10-CM

## 2022-02-10 NOTE — Progress Notes (Signed)
[facility] engaged patient in termination session. [facility] assisted patient in processing thoughts and emotions related to it being last session with current therapist. [facility] reviewed progress with client and commended her on efforts, vulnerability, and progress. [facility] assessed safety/risk, patient reports no active SI/HI thoughts, but said she will "commit herself" if any active SI/HI returns. Patient begins therapy in following week with therapist at [facility].

## 2022-02-10 NOTE — Progress Notes (Signed)
BP 124/80   Pulse (!) 103   Temp (!) 96.3 F (35.7 C)   Ht 5' 6"  (1.676 m)   Wt 203 lb (92.1 kg)   SpO2 96%   BMI 32.77 kg/m    Subjective:    Patient ID: Briana Wyatt, female    DOB: Nov 22, 1970, 51 y.o.   MRN: [id number]  HPI: Briana Wyatt is a 51 y.o. female presenting on 02/10/2022 for Mental Health Problem   HPI   Pt was scheduled to come in today after email pt sent over the weekend.  Pt says she got angry over the weekend.  She says she ended up taking 6 ambien (as a mistake; she only remembers taking one).  She is concerned about loosing her housing due to she has alienated her family.      She met with pyschiatrist , Kenney Houseman,  at [facility], who started her on cymbalta.  She has counselor also-  at [facility] (our counselor here is leaving after this week).   Pt is concerned because she Doesn't have $20/visit that they charge.    She Has appointment with GI coming up also.  She says she has virtual appt with neurologist end of this month.    She says her f/u with psychiatrist is end of July.      Relevant past medical, surgical, family and social history reviewed and updated as indicated. Interim medical history since our last visit reviewed. Allergies and medications reviewed and updated.   Current Outpatient Medications:    acetaminophen (TYLENOL) 650 MG CR tablet, Take 650 mg by mouth every 8 (eight) hours as needed for pain or fever., Disp: , Rfl:    albuterol (VENTOLIN HFA) 108 (90 Base) MCG/ACT inhaler, INHALE 2 PUFFS BY MOUTH EVERY 6 HOURS AS NEEDED FOR COUGHING, WHEEZING, OR SHORTNESS OF BREATH, Disp: 20.1 g, Rfl: 0   Calcium Carbonate-Vitamin D (CALTRATE 600+D PO), Take by mouth., Disp: , Rfl:    cetirizine (ZYRTEC) 10 MG tablet, Take 10 mg by mouth daily., Disp: , Rfl:    Cholecalciferol (VITAMIN D-3 PO), Take 1 tablet by mouth daily., Disp: , Rfl:    DULoxetine (CYMBALTA) 30 MG capsule, Take 1 capsule (30 mg total) by mouth daily., Disp: 30 capsule,  Rfl: 0   gabapentin (NEURONTIN) 300 MG capsule, Take 2 capsules (600 mg total) by mouth 2 (two) times daily. (Patient taking differently: Take 900 mg by mouth 2 (two) times daily.), Disp: 120 capsule, Rfl: 0   losartan (COZAAR) 50 MG tablet, TAKE 1 Tablet BY MOUTH ONCE EVERY DAY, Disp: 90 tablet, Rfl: 0   metoprolol tartrate (LOPRESSOR) 100 MG tablet, Take tablet TWO hours prior to your cardiac CT scan., Disp: 1 tablet, Rfl: 0   metroNIDAZOLE (FLAGYL) 500 MG tablet, Take 1 tablet (500 mg total) by mouth 2 (two) times daily., Disp: 14 tablet, Rfl: 0   Multiple Vitamins-Minerals (CENTRUM SILVER PO), Take 1 tablet by mouth daily., Disp: , Rfl:    omeprazole (PRILOSEC) 40 MG capsule, Take 40 mg by mouth daily., Disp: , Rfl:    traMADol (ULTRAM) 50 MG tablet, TAKE 1 TABLET(50 MG) BY MOUTH EVERY 12 HOURS AS NEEDED, Disp: 30 tablet, Rfl: 0   SUMAtriptan (IMITREX) 25 MG tablet, Take 25 mg by mouth every 2 (two) hours as needed for migraine. (Patient not taking: Reported on 02/10/2022), Disp: , Rfl:     Review of Systems  Per HPI unless specifically indicated above     Objective:  BP 124/80   Pulse (!) 103   Temp (!) 96.3 F (35.7 C)   Ht 5' 6"  (1.676 m)   Wt 203 lb (92.1 kg)   SpO2 96%   BMI 32.77 kg/m   Wt Readings from Last 3 Encounters:  02/10/22 203 lb (92.1 kg)  01/20/22 198 lb (89.8 kg)  01/06/22 197 lb (89.4 kg)    Physical Exam Vitals reviewed.  Constitutional:      General: She is not in acute distress.    Appearance: She is well-developed. She is not toxic-appearing.  HENT:     Head: Normocephalic and atraumatic.     Right Ear: Tympanic membrane, ear canal and external ear normal.     Left Ear: Tympanic membrane, ear canal and external ear normal.  Eyes:     Extraocular Movements: Extraocular movements intact.     Conjunctiva/sclera: Conjunctivae normal.     Pupils: Pupils are equal, round, and reactive to light.  Cardiovascular:     Rate and Rhythm: Normal rate and  regular rhythm.  Pulmonary:     Effort: Pulmonary effort is normal.     Breath sounds: Normal breath sounds.  Abdominal:     General: Bowel sounds are normal.     Palpations: Abdomen is soft. There is no mass.     Tenderness: There is no abdominal tenderness.  Musculoskeletal:     Cervical back: Neck supple.     Right lower leg: No edema.     Left lower leg: No edema.  Lymphadenopathy:     Cervical: No cervical adenopathy.  Skin:    General: Skin is warm and dry.  Neurological:     Mental Status: She is alert and oriented to person, place, and time.  Psychiatric:        Attention and Perception: Attention normal.        Speech: Speech normal.        Behavior: Behavior normal. Behavior is cooperative.     Comments: She was upbeat, appropriate, alert.               Assessment & Plan:    Encounter Diagnosis  Name Primary?   Mental health problem Yes      -pt has appointment with counselor today -she is encouraged to contact her psychiatrist and request to follow up sooner than end of July  -pt is made aware of behavioral urgent care in [place] -pt is not actively [place], [place]  -encouraged pt to discuss any concerns about neurological problems with her neurologist.  CT head done in April was reviewed with pt; it was unremarkable -pt has appointment to follow up here in 1 month.  She is to contact office sooner prn

## 2022-02-11 ENCOUNTER — Encounter: Payer: Self-pay | Admitting: Physician Assistant

## 2022-02-12 NOTE — Telephone Encounter (Signed)
Pt was called and questions answered

## 2022-02-13 ENCOUNTER — Telehealth: Payer: Self-pay

## 2022-02-19 ENCOUNTER — Telehealth (INDEPENDENT_AMBULATORY_CARE_PROVIDER_SITE_OTHER): Payer: Self-pay

## 2022-02-19 ENCOUNTER — Other Ambulatory Visit (INDEPENDENT_AMBULATORY_CARE_PROVIDER_SITE_OTHER): Payer: Self-pay

## 2022-02-19 ENCOUNTER — Encounter (INDEPENDENT_AMBULATORY_CARE_PROVIDER_SITE_OTHER): Payer: Self-pay | Admitting: Gastroenterology

## 2022-02-19 ENCOUNTER — Ambulatory Visit (INDEPENDENT_AMBULATORY_CARE_PROVIDER_SITE_OTHER): Payer: Self-pay | Admitting: Gastroenterology

## 2022-02-19 ENCOUNTER — Encounter (INDEPENDENT_AMBULATORY_CARE_PROVIDER_SITE_OTHER): Payer: Self-pay

## 2022-02-19 VITALS — BP 122/73 | HR 60 | Temp 97.7°F | Ht 66.0 in | Wt 200.2 lb

## 2022-02-19 DIAGNOSIS — Z1211 Encounter for screening for malignant neoplasm of colon: Secondary | ICD-10-CM

## 2022-02-19 DIAGNOSIS — R1013 Epigastric pain: Secondary | ICD-10-CM | POA: Insufficient documentation

## 2022-02-19 DIAGNOSIS — R110 Nausea: Secondary | ICD-10-CM

## 2022-02-19 DIAGNOSIS — K649 Unspecified hemorrhoids: Secondary | ICD-10-CM

## 2022-02-19 MED ORDER — PEG 3350-KCL-NA BICARB-NACL 420 G PO SOLR: 4000.0000 mL | ORAL | 0 refills | Status: DC

## 2022-02-19 MED ORDER — HYDROCORTISONE (PERIANAL) 2.5 % EX CREA: 1.0000 | TOPICAL_CREAM | Freq: Three times a day (TID) | CUTANEOUS | 1 refills | Status: DC

## 2022-02-19 NOTE — Progress Notes (Signed)
Referring Provider: Soyla Dryer, PA-C Primary Care Physician:  Soyla Dryer, PA-C Primary GI Physician: new  Chief Complaint  Patient presents with   Hemorrhoids    Referred for rectal pain when having a bowel movement and hemorrhoids. Tried cream for hemorrhoid. Stools are large.    HPI:   Briana Wyatt is a 51 y.o. female with past medical history of acid reflux, craniofacial hyperhidrosis, HTN, RLS.   Patient presenting today as a new patient for rectal pain/possible hemorrhoids.  Patient reports that she has had issues with hemorrhoids for a while. She has a large hemorrhoid right now. She states that stools are very large and painful, sometimes feels like she is pooping glass and will sometimes see some toilet tissue hematochezia. Stools are sometimes very soft.  Having a BM , usually daily. She reports that she often feels like she cannot get herself clean enough due to this. She states diet is not great but she does drink a lot of water. Usually has a BM daily, though yesterday she did have some looser stools, thinks it was related to what she ate. She has tried preparation H for the hemorrhoids. She states that she is not bothered by hemorrhoids except during BMs. BMs are not as painful if stools are softer. Some toilet tissue hematochezia but otherwise no rectal bleeding.   She does report that she is taking '65mg'$  of iron QID, she started this as she tried to donate plasma a while back and was told that her iron was too low, notably hgb was 12.9 in May, MCV was normal at 87. Having darker stools since starting iron but none prior has not had iron levels evaluated by PCP.  She does report that she has a lot of flatulence. She does eat a lot of cabbage and broccoli. She does not take anything for this as she feels better once she expels it.   She has nausea all the time, no vomiting. Sometimes has dry heaves. She has had nausea for the past few months, but thinks it may be  related to her ongoing headaches/ migraines. She has some epigastric tenderness, worsened after eating. Denies early satiety. She denies any weight loss.    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     albuterol (VENTOLIN HFA) 108 (90 Base) MCG/ACT inhaler INHALE 2 PUFFS BY MOUTH EVERY 6 HOURS AS NEEDED FOR COUGHING, WHEEZING, OR SHORTNESS OF BREATH 20.1 g 0   Calcium Carbonate-Vitamin D (CALTRATE 600+D PO) Take by mouth.     cetirizine (ZYRTEC) 10 MG tablet Take 10 mg by mouth daily.     Cholecalciferol (VITAMIN D-3 PO)  Take 1 tablet by mouth daily.     DULoxetine (CYMBALTA) 30 MG capsule Take 1 capsule (30 mg total) by mouth daily. 30 capsule 0   Erenumab-aooe (AIMOVIG, 140 MG DOSE, [place]) One a month     gabapentin (NEURONTIN) 300 MG capsule Take 2 capsules (600 mg total) by mouth 2 (two) times daily. (Patient taking differently: Take 900 mg by mouth 2 (two) times daily.) 120 capsule 0   losartan (COZAAR) 50 MG tablet TAKE 1 Tablet BY MOUTH ONCE EVERY DAY 90 tablet 0   Multiple Vitamins-Minerals (CENTRUM SILVER PO) Take 1 tablet by mouth daily.     omeprazole (PRILOSEC) 40 MG capsule Take 40 mg by mouth daily.     SUMAtriptan (IMITREX) 25 MG tablet Take 25 mg by mouth  every 2 (two) hours as needed for migraine.     traMADol (ULTRAM) 50 MG tablet TAKE 1 TABLET(50 MG) BY MOUTH EVERY 12 HOURS AS NEEDED 30 tablet 0   No current facility-administered medications for this visit.    Allergies as of 02/19/2022 - Review Complete 02/19/2022  Allergen Reaction Noted   Codeine Nausea And Vomiting 05/26/2017   Methocarbamol Itching 05/26/2017   Nsaids  04/07/2019    Family History  Problem Relation Age of Onset   Hypertension Father    Stroke Father     Social History   Socioeconomic History   Marital status: Single    Spouse name: Not on file   Number of children: 3   Years of education: Not on file   Highest education level: GED or equivalent  Occupational History   Not on file  Tobacco Use   Smoking status: Former    Types: Cigarettes    Quit date: 08/25/1995    Years since quitting: 26.5    Passive exposure: Past   Smokeless tobacco: Never  Vaping Use   Vaping Use: Never used  Substance and Sexual Activity   Alcohol use: No   Drug use: Not Currently    Types: Marijuana, Cocaine    Comment: denies use 10/02/21   Sexual activity: Not Currently    Birth control/protection: Surgical  Other Topics Concern   Not on file  Social History Narrative   Not on file   Social Determinants of Health   Financial Resource Strain: Not on file  Food Insecurity: No Food Insecurity (11/06/2021)   Hunger Vital Sign    Worried About Running Out of Food in the Last Year: Never true    Ran Out of Food in the Last Year: Never true  Transportation Needs: No Transportation Needs (11/06/2021)   PRAPARE - Hydrologist (Medical): No    Lack of Transportation (Non-Medical): No  Physical Activity: Not on file  Stress: Not on file  Social Connections: Not on file   Review of systems General: negative for malaise, night sweats, fever, chills, weight loss Neck: Negative for lumps, goiter, pain and significant neck swelling Resp:  Negative for cough, wheezing, dyspnea at rest CV: Negative for chest pain, leg swelling, palpitations, orthopnea GI: denies melena, hematochezia,  vomiting, diarrhea, constipation, dysphagia, odyonophagia, early satiety or unintentional weight loss. +large, painful stools +hemorrhoids/rectal pain +nausea MSK: Negative for joint pain or swelling, back pain, and muscle pain. Derm: Negative for itching or rash Psych: Denies depression, anxiety, memory loss, confusion. No homicidal or suicidal ideation.  Heme: Negative for prolonged bleeding, bruising easily, and swollen nodes. Endocrine: Negative for cold or heat intolerance, polyuria, polydipsia and  goiter. Neuro: negative for tremor, gait imbalance, syncope and seizures. The remainder of the review of systems is noncontributory.  Physical Exam: BP 122/73 (BP Location: Left Arm, Patient Position: Sitting, Cuff Size: Large)   Pulse 60   Temp 97.7 F (36.5 C) (Oral)   Ht '5\' 6"'$  (1.676 m)   Wt 200 lb 3.2 oz (90.8 kg)   BMI 32.31 kg/m  General:   Alert and oriented. No distress noted. Pleasant and cooperative.  Head:  Normocephalic and atraumatic. Eyes:  Conjuctiva clear without scleral icterus. Mouth:  Oral mucosa pink and moist. Good dentition. No lesions. Heart: Normal rate and rhythm, s1 and s2 heart sounds present.  Lungs: Clear lung sounds in all lobes. Respirations equal and unlabored. Abdomen:  +BS, soft, and non-distended. TTP of epigastric region. No rebound or guarding. No HSM or masses noted. Rectal: Briana Cyphers, LPN present as witness, small, non bleeding, non thrombosed hemorrhoid in anterior anal region, Sphincter tone WNL. no other lesions or masses, specifically no obvious anal fissures present. Derm: No palmar erythema or jaundice Msk:  Symmetrical without gross deformities. Normal posture. Extremities:  Without edema. Neurologic:  Alert and  oriented x4 Psych:  Alert and cooperative. Normal mood and affect.  Invalid  input(s): "6 MONTHS"   ASSESSMENT: Briana Wyatt is a 51 y.o. female presenting today for rectal pain/hemorrhoids.  Patient with rectal pain with some larger/harder stools at times, though does not feel constipated. Has occasional toilet tissue hematochezia. At times of softer stools she has no rectal pain. Upon rectal exam, small non thrombosed non bleeding anteriorly located external hemorrhoid. Encouraged patient to continue with ample water intake, start miralax 17 day to help keep stools soft and avoid straining/limit toilet time. Will also send Rx anusol cream to be used TID x10 days then PRN thereafter.   Patient reports nausea for the past few months, no vomiting, but pain is worse sometimes after eating. She denies ETOH or NSAID use. Epigastric tenderness present on exam. Recommend proceeding with EGD for further evaluation as we cannot rule out gastritis, PUD, duodenitis, or less likely, malignancy.   Notably, patient has never had colonoscopy for CRC screening she is at average risk. Will get her scheduled for initial screening colonoscopy at time of EGD. Indications, risks and benefits of procedure discussed in detail with patient. Patient verbalized understanding and is in agreement to proceed with EGD/Colonoscopy at this time.   In regards to reported low iron, last hgb in May was WNL, no iron studies in chart for review. Patient states she went to give plasma and was told iron was low so started taking OTC '65mg'$  Iron QID on her own.  I advised patient to stop iron pills at this time as these can be detrimental if iron isnt low. She denies melena and has only occasional toilet tissue hematochezia, no previous fatigue, SOB or dizziness.    PLAN:  Stop iron pills  2. Schedule colonoscopy + EGD 3. Miralax 1 capful per day, increase to 2 capfuls per day if no improvement after 1 week 4. Rx Anusol TID x10 days, PRN thereafter 5. Avoid straining, limit toilet time   All questions were  answered, patient verbalized understanding and is in agreement with plan as outlined above.    Follow Up: 3 years   Mliss Wedin L. Alver Sorrow, MSN, APRN, AGNP-C Adult-Gerontology Nurse Practitioner [facility]

## 2022-02-19 NOTE — Patient Instructions (Addendum)
We will get you scheduled for colonoscopy and Upper endoscopy Please continue to drink plenty of water I would advise you to star miralax, 1 capful per day to help keep stools nice and soft to avoid further pressure on rectal area, if stools are not softer after using 1 capful daily for a week, you can increase to two capfuls daily.  Avoid straining and try to limit toilet time as this will put more pressure on hemorrhoids, making them worse Please stop iron supplements at this time, as your last hemoglobin in May was normal I am sending anusol cream to use on hemorrhoids, you can use this up to three times per day x10 days then as needed thereafter  Follow up 3 months

## 2022-02-19 NOTE — Telephone Encounter (Signed)
Briana Wyatt Briana Wyatt, CMA  ?

## 2022-02-20 ENCOUNTER — Encounter: Payer: Medicaid Other | Admitting: Adult Health

## 2022-02-24 DIAGNOSIS — T1491XA Suicide attempt, initial encounter: Secondary | ICD-10-CM | POA: Insufficient documentation

## 2022-03-05 ENCOUNTER — Encounter: Payer: Self-pay | Admitting: Physician Assistant

## 2022-03-06 ENCOUNTER — Encounter: Payer: Self-pay | Admitting: Orthopedic Surgery

## 2022-03-06 ENCOUNTER — Ambulatory Visit (INDEPENDENT_AMBULATORY_CARE_PROVIDER_SITE_OTHER): Payer: Self-pay | Admitting: Orthopedic Surgery

## 2022-03-06 DIAGNOSIS — R202 Paresthesia of skin: Secondary | ICD-10-CM

## 2022-03-06 DIAGNOSIS — M542 Cervicalgia: Secondary | ICD-10-CM

## 2022-03-06 DIAGNOSIS — G5621 Lesion of ulnar nerve, right upper limb: Secondary | ICD-10-CM

## 2022-03-06 NOTE — Progress Notes (Signed)
Orthopaedic Clinic Return  Assessment: Briana Wyatt is a 51 y.o. female with the following: Chronic bilateral shoulder pain, left worse than right currently Right cubital tunnel syndrome  Plan: Continues to have right hand pain, with numbness to the ulnar side of her hand.  EMG was only consistent with mild carpal tunnel syndrome, although she has had bilateral carpal tunnel release in the past.  She has had an MRI recently that does demonstrate some stenosis.  She is scheduled to see a neurosurgeon within the next 1-2 months.  It is possible that some of the findings on the MRI can explain some of her current symptoms.  Although she has some symptoms consistent with cubital tunnel, the EMG did not demonstrate findings consistent with this diagnosis.  Pending the recommendations of the neurosurgeon, she can return to clinic for continued treatment.   Follow-up: Return if symptoms worsen or fail to improve.   Subjective:  Chief Complaint  Patient presents with   Hand Pain    RT hand/Review NCS results    History of Present Illness: Briana Wyatt is a 51 y.o. female who returns to clinic for repeat evaluation of her right hand numbness and elbow pain.  She continues to have pain in the right hand, with numbness in the ulnar side of her hand.  She has had EMG testing done.  She is here to discuss the results.  She also has tenderness to palpation within the cubital tunnel, and olecranon process.  Review of Systems: No fevers or chills + numbness & tingling No chest pain No shortness of breath No bowel or bladder dysfunction No GI distress No headaches   Objective: There were no vitals taken for this visit.  Physical Exam:  Alert and oriented.  No acute distress.  Right shoulder without deformity.  Active motion to 110 degrees of forward flexion.  Internal rotation to lumbar spine.  Positive Tinel's over the cubital tunnel.  Decree sensation to the small finger, as  well as the ulnar aspect of the ring finger.  No muscle atrophy in the right hand.  Left shoulder without deformity.  Tenderness to palpation over the posterior lateral aspect of the shoulder.  Pain in the empty can testing position.  Some pain with strength testing.  IMAGING: I personally ordered and reviewed the following images:  No new imaging obtained today.  Impression: The above electrodiagnostic study is ABNORMAL and reveals evidence of a mild right median nerve entrapment at the wrist (carpal tunnel syndrome) affecting sensory components.  As her distribution of symptoms is somewhat nondermatomal this could explain some of her hand symptoms. **Cannot rule out sensory predominant ulnar neuropathy at the wrist but ulnor motor conductions are normal.   Mordecai Rasmussen, MD 03/06/2022 11:07 PM

## 2022-03-09 NOTE — Telephone Encounter (Signed)
Attempted to call pt in regard to email she sent over the weekend.  No answer.  Left VM asking pt to call office.

## 2022-03-09 NOTE — Telephone Encounter (Signed)
Pt returned telephone call.  She had a virtual/telephone appointment with [place] person on  03/06/22.   She spoke with mobile crisis on Friday and Saturday.   Pt says she has not seen [place] at [facility] clinic in about a month.   She has appt there tomorrow at 11:30am.  She has appt at this office at 1:00pm tomorrow.    Pt says she has no plan now for hurting herself but says she thinks about suicide every day.  Over the past week, she had planned to sit in her running car to kill herself but then her car quit working.  She had 106 tablets of melatonin and she took 3 handfuls over an 8 hour period.  She also states that she took several handfuls of gabapentin.  Pt says she is still suicidal but not actively planning.  She says she is good for the rest of today.  She says she is going to the hospital tomorrow after her appointment with me.   When asked why she didn't just go now, she says she has to cut the grass, get someone to care for the cat and wait for her mother to drive her.     Pt is talking in complete sentences and sounds like her usual self, talking at length about how awful her mother is to her and all the things she must do in order to be able to live at her mother's.  She responds appropriately to questions.  She says she will be in for her appointment tomorrow.    Discussed better options of obtaining help than sending emails to this office of Thursday nights (knowing that this office is closed on Friday, Saturday, Sunday).  Recommended contacting her [place] provider at [facility] clinic, calling the mobile crisis line or going to ER.  She agrees those would be more productive.  She says she doesn't know why she sends the emails through [place].

## 2022-03-10 ENCOUNTER — Ambulatory Visit: Payer: Medicaid Other | Admitting: Physician Assistant

## 2022-03-10 ENCOUNTER — Emergency Department (HOSPITAL_COMMUNITY)
Admission: EM | Admit: 2022-03-10 | Discharge: 2022-03-11 | Disposition: A | Discharge status: Home / Self Care | Payer: Self-pay | Attending: Emergency Medicine | Admitting: Emergency Medicine

## 2022-03-10 ENCOUNTER — Encounter: Payer: Self-pay | Admitting: Physician Assistant

## 2022-03-10 ENCOUNTER — Encounter (HOSPITAL_COMMUNITY): Payer: Self-pay

## 2022-03-10 ENCOUNTER — Other Ambulatory Visit: Payer: Self-pay

## 2022-03-10 VITALS — BP 130/90 | HR 74 | Temp 97.2°F | Ht 66.0 in | Wt 197.3 lb

## 2022-03-10 DIAGNOSIS — F333 Major depressive disorder, recurrent, severe with psychotic symptoms: Secondary | ICD-10-CM | POA: Insufficient documentation

## 2022-03-10 DIAGNOSIS — T1491XA Suicide attempt, initial encounter: Secondary | ICD-10-CM | POA: Insufficient documentation

## 2022-03-10 DIAGNOSIS — Z20822 Contact with and (suspected) exposure to covid-19: Secondary | ICD-10-CM | POA: Insufficient documentation

## 2022-03-10 DIAGNOSIS — X838XXA Intentional self-harm by other specified means, initial encounter: Secondary | ICD-10-CM | POA: Insufficient documentation

## 2022-03-10 DIAGNOSIS — F489 Nonpsychotic mental disorder, unspecified: Secondary | ICD-10-CM

## 2022-03-10 DIAGNOSIS — R45851 Suicidal ideations: Secondary | ICD-10-CM | POA: Insufficient documentation

## 2022-03-10 DIAGNOSIS — R0683 Snoring: Secondary | ICD-10-CM

## 2022-03-10 DIAGNOSIS — T452X1A Poisoning by vitamins, accidental (unintentional), initial encounter: Secondary | ICD-10-CM | POA: Insufficient documentation

## 2022-03-10 DIAGNOSIS — T425X1A Poisoning by mixed antiepileptics, accidental (unintentional), initial encounter: Secondary | ICD-10-CM | POA: Insufficient documentation

## 2022-03-10 DIAGNOSIS — Z5900 Homelessness unspecified: Secondary | ICD-10-CM | POA: Insufficient documentation

## 2022-03-10 DIAGNOSIS — I10 Essential (primary) hypertension: Secondary | ICD-10-CM

## 2022-03-10 DIAGNOSIS — R222 Localized swelling, mass and lump, trunk: Secondary | ICD-10-CM

## 2022-03-10 LAB — RAPID URINE DRUG SCREEN, HOSP PERFORMED
Amphetamines: NOT DETECTED
Barbiturates: NOT DETECTED
Benzodiazepines: NOT DETECTED
Cocaine: NOT DETECTED
Opiates: NOT DETECTED
Tetrahydrocannabinol: NOT DETECTED

## 2022-03-10 LAB — CBC
HCT: 36.5 % (ref 36.0–46.0)
Hemoglobin: 12 g/dL (ref 12.0–15.0)
MCH: 28.3 pg (ref 26.0–34.0)
MCHC: 32.9 g/dL (ref 30.0–36.0)
MCV: 86.1 fL (ref 80.0–100.0)
Platelets: 252 10*3/uL (ref 150–400)
RBC: 4.24 MIL/uL (ref 3.87–5.11)
RDW: 12.1 % (ref 11.5–15.5)
WBC: 6.3 10*3/uL (ref 4.0–10.5)
nRBC: 0 % (ref 0.0–0.2)

## 2022-03-10 LAB — COMPREHENSIVE METABOLIC PANEL
ALT: 29 U/L (ref 0–44)
AST: 23 U/L (ref 15–41)
Albumin: 3.7 g/dL (ref 3.5–5.0)
Alkaline Phosphatase: 89 U/L (ref 38–126)
Anion gap: 5 (ref 5–15)
BUN: 17 mg/dL (ref 6–20)
CO2: 26 mmol/L (ref 22–32)
Calcium: 8.9 mg/dL (ref 8.9–10.3)
Chloride: 108 mmol/L (ref 98–111)
Creatinine, Ser: 1 mg/dL (ref 0.44–1.00)
GFR, Estimated: >60 mL/min (ref >60)
Glucose, Bld: 83 mg/dL (ref 70–99)
Potassium: 3.6 mmol/L (ref 3.5–5.1)
Sodium: 139 mmol/L (ref 135–145)
Total Bilirubin: 0.4 mg/dL (ref 0.3–1.2)
Total Protein: 6.7 g/dL (ref 6.5–8.1)

## 2022-03-10 LAB — ETHANOL: Alcohol, Ethyl (B): <10 mg/dL (ref <10)

## 2022-03-10 LAB — SALICYLATE LEVEL: Salicylate Lvl: <7 mg/dL — ABNORMAL LOW (ref 7.0–30.0)

## 2022-03-10 LAB — ACETAMINOPHEN LEVEL: Acetaminophen (Tylenol), Serum: <10 ug/mL — ABNORMAL LOW (ref 10–30)

## 2022-03-10 MED ORDER — ZIPRASIDONE MESYLATE 20 MG IM SOLR: 20.0000 mg | INTRAMUSCULAR | Status: DC | PRN

## 2022-03-10 MED ORDER — GABAPENTIN 300 MG PO CAPS: 600.0000 mg | ORAL_CAPSULE | Freq: Two times a day (BID) | ORAL | Status: DC

## 2022-03-10 MED ORDER — PROCHLORPERAZINE MALEATE 10 MG PO TABS
10.0000 mg | ORAL_TABLET | Freq: Once | ORAL | Status: AC
  Administered 2022-03-10: 10 mg via ORAL
  Filled 2022-03-10 (×2): qty 1

## 2022-03-10 MED ORDER — PANTOPRAZOLE SODIUM 40 MG PO TBEC
40.0000 mg | DELAYED_RELEASE_TABLET | Freq: Every day | ORAL | Status: DC
  Administered 2022-03-11: 40 mg via ORAL
  Filled 2022-03-10 (×2): qty 1

## 2022-03-10 MED ORDER — RISPERIDONE 1 MG PO TBDP: 2.0000 mg | ORAL_TABLET | Freq: Three times a day (TID) | ORAL | Status: DC | PRN

## 2022-03-10 MED ORDER — LOSARTAN POTASSIUM 25 MG PO TABS
50.0000 mg | ORAL_TABLET | Freq: Every day | ORAL | Status: DC
  Administered 2022-03-10 – 2022-03-11 (×2): 50 mg via ORAL
  Filled 2022-03-10 (×2): qty 2

## 2022-03-10 MED ORDER — ONDANSETRON HCL 4 MG PO TABS: 4.0000 mg | ORAL_TABLET | Freq: Three times a day (TID) | ORAL | Status: DC | PRN

## 2022-03-10 MED ORDER — ACETAMINOPHEN 500 MG PO TABS
1000.0000 mg | ORAL_TABLET | Freq: Once | ORAL | Status: AC
  Administered 2022-03-10: 1000 mg via ORAL
  Filled 2022-03-10: qty 2

## 2022-03-10 MED ORDER — GABAPENTIN 300 MG PO CAPS
900.0000 mg | ORAL_CAPSULE | Freq: Two times a day (BID) | ORAL | Status: DC
  Administered 2022-03-10 – 2022-03-11 (×2): 900 mg via ORAL
  Filled 2022-03-10 (×2): qty 3

## 2022-03-10 MED ORDER — LORAZEPAM 1 MG PO TABS: 1.0000 mg | ORAL_TABLET | ORAL | Status: DC | PRN

## 2022-03-10 MED ORDER — DULOXETINE HCL 30 MG PO CPEP
30.0000 mg | ORAL_CAPSULE | Freq: Every day | ORAL | Status: DC
  Administered 2022-03-10 – 2022-03-11 (×2): 30 mg via ORAL
  Filled 2022-03-10 (×2): qty 1

## 2022-03-10 NOTE — BH Assessment (Signed)
Clinician messaged Leverne Humbles, RN: "Hey. It's Trey with TTS. Is the pt able to engage in the assessment, if so the pt will need to be placed in a private room. Is the pt under IVC? Also is the pt medically cleared?"  Clinician awaiting response.    Vertell Novak, [place], [facility], [facility] Triage Specialist [phone]

## 2022-03-10 NOTE — ED Provider Notes (Signed)
[facility] EMERGENCY DEPARTMENT Provider Note   CSN: [id number] Arrival date & time: 03/10/22  1708     History  Chief Complaint  Patient presents with   Drug Overdose    Briana Wyatt is a 51 y.o. female.  HPI    51 year old female comes in with chief complaint of overdose.  Patient has history of sciatica and depression.  She indicates that last week she attempted to overdose on melatonin and gabapentin because of her SI.  Normally she is triggered by her mother, and when she gets angry she will lash out by wanting to overdose.  She has chronic SI, but normally she will not attempt to overdose.  Patient also states that she was kicked out of the camper where she lived, as her mom owns it.  She is currently homeless.  She alleges that she talk to her psychiatrist or telehealth today and they encouraged her to go to the ER.  She also admits to relapsing on crack last week.  Home Medications Prior to Admission medications   Medication Sig Start Date End Date Taking? Authorizing Provider  acetaminophen (TYLENOL) 650 MG CR tablet Take 650 mg by mouth every 8 (eight) hours as needed for pain or fever. 03/02/19  Yes [provider]  albuterol (VENTOLIN HFA) 108 (90 Base) MCG/ACT inhaler INHALE 2 PUFFS BY MOUTH EVERY 6 HOURS AS NEEDED FOR COUGHING, WHEEZING, OR SHORTNESS OF BREATH Patient taking differently: Inhale 2 puffs into the lungs every 6 (six) hours as needed for wheezing or shortness of breath. 12/22/21  Yes Soyla Dryer, PA-C  Calcium Carbonate-Vitamin D (CALTRATE 600+D PO) Take by mouth.   Yes [provider]  cetirizine (ZYRTEC) 10 MG tablet Take 10 mg by mouth daily.   Yes [provider]  Cholecalciferol (VITAMIN D-3 PO) Take 1 tablet by mouth daily.   Yes [provider]  CYMBALTA 20 MG capsule Take 20 mg by mouth 2 (two) times daily. 01/28/22  Yes [provider]  Erenumab-aooe (AIMOVIG, 140 MG DOSE, [place]) One a month 02/18/22   Yes [provider]  gabapentin (NEURONTIN) 300 MG capsule Take 2 capsules (600 mg total) by mouth 2 (two) times daily. Patient taking differently: Take 900 mg by mouth 2 (two) times daily. 01/06/22 03/10/22 Yes Mordecai Rasmussen, MD  losartan (COZAAR) 50 MG tablet TAKE 1 Tablet BY MOUTH ONCE EVERY DAY Patient taking differently: Take 50 mg by mouth daily. 12/22/21  Yes Soyla Dryer, PA-C  Multiple Vitamins-Minerals (CENTRUM SILVER PO) Take 1 tablet by mouth daily.   Yes [provider]  omeprazole (PRILOSEC) 40 MG capsule Take 40 mg by mouth daily. 12/22/21  Yes [provider]  rizatriptan (MAXALT) 10 MG tablet Take 10 mg by mouth as needed for migraine. 01/29/22  Yes [provider]  tiZANidine (ZANAFLEX) 2 MG tablet Take 2 mg by mouth 2 (two) times daily. 02/02/22  Yes [provider]  DULoxetine (CYMBALTA) 30 MG capsule Take 1 capsule (30 mg total) by mouth daily. Patient not taking: Reported on 03/10/2022 01/19/22   Derrill Center, NP  SUMAtriptan (IMITREX) 25 MG tablet Take 25 mg by mouth every 2 (two) hours as needed for migraine. Patient not taking: Reported on 03/10/2022 12/24/21   [provider]  traMADol (ULTRAM) 50 MG tablet TAKE 1 TABLET(50 MG) BY MOUTH EVERY 12 HOURS AS NEEDED Patient not taking: Reported on 03/10/2022 12/23/21   Mordecai Rasmussen, MD      Allergies  Codeine, Methocarbamol, and Nsaids    Review of Systems   Review of Systems  All other systems reviewed and are negative.   Physical Exam Updated Vital Signs BP (!) 153/95   Pulse 79   Temp 97.6 F (36.4 C) (Oral)   Resp 16   Ht '5\' 6"'$  (1.676 m)   Wt 89.4 kg   SpO2 100%   BMI 31.80 kg/m  Physical Exam Vitals and nursing note reviewed.  Constitutional:      Appearance: She is well-developed.  HENT:     Head: Atraumatic.  Eyes:     Extraocular Movements: Extraocular movements intact.     Pupils: Pupils are equal, round, and reactive to light.   Cardiovascular:     Rate and Rhythm: Normal rate.  Pulmonary:     Effort: Pulmonary effort is normal.  Musculoskeletal:     Cervical back: Normal range of motion and neck supple.  Skin:    General: Skin is warm and dry.  Neurological:     Mental Status: She is alert and oriented to person, place, and time.  Psychiatric:        Mood and Affect: Mood normal.        Thought Content: Thought content normal.     ED Results / Procedures / Treatments   Labs (all labs ordered are listed, but only abnormal results are displayed) Labs Reviewed  SALICYLATE LEVEL - Abnormal; Notable for the following components:      Result Value   Salicylate Lvl <5.8 (*)    All other components within normal limits  ACETAMINOPHEN LEVEL - Abnormal; Notable for the following components:   Acetaminophen (Tylenol), Serum <10 (*)    All other components within normal limits  RESP PANEL BY RT-PCR (FLU A&B, COVID) ARPGX2  COMPREHENSIVE METABOLIC PANEL  ETHANOL  CBC  RAPID URINE DRUG SCREEN, HOSP PERFORMED  POC URINE PREG, ED    EKG None  Radiology No results found.  Procedures Procedures    Medications Ordered in ED Medications  DULoxetine (CYMBALTA) DR capsule 30 mg (30 mg Oral Given 03/10/22 1843)  losartan (COZAAR) tablet 50 mg (50 mg Oral Given 03/10/22 1843)  pantoprazole (PROTONIX) EC tablet 40 mg (40 mg Oral Not Given 03/10/22 1845)  gabapentin (NEURONTIN) capsule 900 mg (has no administration in time range)  risperiDONE (RISPERDAL M-TABS) disintegrating tablet 2 mg (has no administration in time range)    And  LORazepam (ATIVAN) tablet 1 mg (has no administration in time range)    And  ziprasidone (GEODON) injection 20 mg (has no administration in time range)  ondansetron (ZOFRAN) tablet 4 mg (has no administration in time range)    ED Course/ Medical Decision Making/ A&P                           Medical Decision Making Amount and/or Complexity of Data Reviewed Labs:  ordered.  Risk Prescription drug management.   51 year old female comes in with chief complaint of suicide attempt and suicidal ideation.  She has history of depression.  She chronically has suicidal ideation, but states that last week she attempted to overdose twice.  She denies any attempt today.  She states that she is concerned however that her ideations are strong enough where she will end up harming herself, therefore she came to the ER.  Exam is reassuring.  She has no medical complaints or concerns.  Patient is medically cleared for psychiatric evaluation. Her  initial labs have been ordered, interpreted.  Normal-appearing CBC, metabolic profile and negative tox work-up.   Final Clinical Impression(s) / ED Diagnoses Final diagnoses:  Suicide attempt [facility])  Suicidal ideations    Rx / DC Orders ED Discharge Orders     None         Varney Biles, MD 03/10/22 1939

## 2022-03-10 NOTE — ED Notes (Signed)
AC called for PO Compazine.

## 2022-03-10 NOTE — ED Triage Notes (Signed)
Attempted to overdose on gabapentin and melatonin twice last week. Was kicked out of her home until she gets treatment.

## 2022-03-10 NOTE — Progress Notes (Signed)
BP 130/90   Pulse 74   Temp (!) 97.2 F (36.2 C)   Ht '5\' 6"'$  (1.676 m)   Wt 197 lb 4.8 oz (89.5 kg)   SpO2 98%   BMI 31.85 kg/m    Subjective:    Patient ID: Briana Wyatt, female    DOB: [birth year], 51 y.o.   MRN: [id number]  HPI: Briana Wyatt is a 51 y.o. female presenting on 03/10/2022 for Hypertension   HPI  Chief Complaint  Patient presents with   Hypertension       Pt had appt with Lavella Lemons, her behavioral health NP,  this morning.  Pt is planning to go to grocery and then to hospital to get admitted for SI.   Her list today: -Coughs at night.   -Pt awakens during the night with trouble breathing.   -Wants to get cyst removed left thorax.    US done February of her chest wall cyst unrevealing CXR January WNL Quit smoking 1997 She says she snores She says she wheezes at times She says her Inhaler doesn't help with the cough or wheeze    Relevant past medical, surgical, family and social history reviewed and updated as indicated. Interim medical history since our last visit reviewed. Allergies and medications reviewed and updated.   Current Outpatient Medications:    acetaminophen (TYLENOL) 650 MG CR tablet, Take 650 mg by mouth every 8 (eight) hours as needed for pain or fever., Disp: , Rfl:    albuterol (VENTOLIN HFA) 108 (90 Base) MCG/ACT inhaler, INHALE 2 PUFFS BY MOUTH EVERY 6 HOURS AS NEEDED FOR COUGHING, WHEEZING, OR SHORTNESS OF BREATH, Disp: 20.1 g, Rfl: 0   Calcium Carbonate-Vitamin D (CALTRATE 600+D PO), Take by mouth., Disp: , Rfl:    cetirizine (ZYRTEC) 10 MG tablet, Take 10 mg by mouth daily., Disp: , Rfl:    Cholecalciferol (VITAMIN D-3 PO), Take 1 tablet by mouth daily., Disp: , Rfl:    DULoxetine (CYMBALTA) 30 MG capsule, Take 1 capsule (30 mg total) by mouth daily., Disp: 30 capsule, Rfl: 0   Erenumab-aooe (AIMOVIG, 140 MG DOSE, [place]), One a month, Disp: , Rfl:    gabapentin (NEURONTIN) 300 MG capsule, Take 2 capsules (600 mg total)  by mouth 2 (two) times daily. (Patient taking differently: Take 900 mg by mouth 2 (two) times daily.), Disp: 120 capsule, Rfl: 0   losartan (COZAAR) 50 MG tablet, TAKE 1 Tablet BY MOUTH ONCE EVERY DAY, Disp: 90 tablet, Rfl: 0   Multiple Vitamins-Minerals (CENTRUM SILVER PO), Take 1 tablet by mouth daily., Disp: , Rfl:    omeprazole (PRILOSEC) 40 MG capsule, Take 40 mg by mouth daily., Disp: , Rfl:    SUMAtriptan (IMITREX) 25 MG tablet, Take 25 mg by mouth every 2 (two) hours as needed for migraine., Disp: , Rfl:    traMADol (ULTRAM) 50 MG tablet, TAKE 1 TABLET(50 MG) BY MOUTH EVERY 12 HOURS AS NEEDED, Disp: 30 tablet, Rfl: 0   hydrocortisone (ANUSOL-HC) 2.5 % rectal cream, Place 1 Application rectally 3 (three) times daily. Use rectally 3 times per day x10 days then PRN thereafter (Patient not taking: Reported on 03/10/2022), Disp: 45 g, Rfl: 1    Review of Systems  Per HPI unless specifically indicated above     Objective:    BP 130/90   Pulse 74   Temp (!) 97.2 F (36.2 C)   Ht '5\' 6"'$  (1.676 m)   Wt 197 lb 4.8 oz (89.5 kg)   SpO2  98%   BMI 31.85 kg/m   Wt Readings from Last 3 Encounters:  03/10/22 197 lb 4.8 oz (89.5 kg)  02/19/22 200 lb 3.2 oz (90.8 kg)  02/10/22 203 lb (92.1 kg)    Physical Exam Vitals reviewed.  Constitutional:      General: She is not in acute distress.    Appearance: She is well-developed. She is not toxic-appearing.  HENT:     Head: Normocephalic and atraumatic.  Cardiovascular:     Rate and Rhythm: Normal rate and regular rhythm.  Pulmonary:     Effort: Pulmonary effort is normal.     Breath sounds: Normal breath sounds.  Chest:     Chest wall: Mass present.     Comments: Small, round, marble size nodule left lateral chest wall.  It is mobile and mildly tender.  No redness or swelling.  Abdominal:     General: Bowel sounds are normal.     Palpations: Abdomen is soft. There is no mass.     Tenderness: There is no abdominal tenderness.   Musculoskeletal:     Cervical back: Neck supple.  Lymphadenopathy:     Cervical: No cervical adenopathy.  Skin:    General: Skin is warm and dry.  Neurological:     Mental Status: She is alert and oriented to person, place, and time.  Psychiatric:        Attention and Perception: Attention normal.        Speech: Speech normal.        Behavior: Behavior normal. Behavior is cooperative.            Assessment & Plan:    Encounter Diagnoses  Name Primary?   Primary hypertension Yes   Mental health problem    Nodule of chest wall    Snoring     -Send to surgeon for left thorax nodule -She has cafa -pt to Continue current meds -Refer sleep study -pt to continue with mental health provider at [facility] -f/u 2 months.  RTO sooner prn

## 2022-03-10 NOTE — ED Notes (Signed)
Gave pt sandwich

## 2022-03-11 ENCOUNTER — Inpatient Hospital Stay (HOSPITAL_COMMUNITY)
Admission: AD | Admit: 2022-03-11 | Discharge: 2022-03-16 | DRG: 885 | Disposition: A | Discharge status: Home / Self Care | Payer: Federal, State, Local not specified - Other | Source: Other Acute Inpatient Hospital | Attending: Emergency Medicine | Admitting: Emergency Medicine

## 2022-03-11 ENCOUNTER — Other Ambulatory Visit: Payer: Self-pay

## 2022-03-11 ENCOUNTER — Encounter (HOSPITAL_COMMUNITY): Payer: Self-pay | Admitting: Emergency Medicine

## 2022-03-11 DIAGNOSIS — Z9151 Personal history of suicidal behavior: Secondary | ICD-10-CM | POA: Diagnosis not present

## 2022-03-11 DIAGNOSIS — K219 Gastro-esophageal reflux disease without esophagitis: Secondary | ICD-10-CM | POA: Diagnosis present

## 2022-03-11 DIAGNOSIS — F1590 Other stimulant use, unspecified, uncomplicated: Secondary | ICD-10-CM

## 2022-03-11 DIAGNOSIS — G43909 Migraine, unspecified, not intractable, without status migrainosus: Secondary | ICD-10-CM | POA: Diagnosis present

## 2022-03-11 DIAGNOSIS — Z87891 Personal history of nicotine dependence: Secondary | ICD-10-CM

## 2022-03-11 DIAGNOSIS — Z818 Family history of other mental and behavioral disorders: Secondary | ICD-10-CM

## 2022-03-11 DIAGNOSIS — G2581 Restless legs syndrome: Secondary | ICD-10-CM | POA: Diagnosis present

## 2022-03-11 DIAGNOSIS — F329 Major depressive disorder, single episode, unspecified: Secondary | ICD-10-CM | POA: Diagnosis not present

## 2022-03-11 DIAGNOSIS — Z823 Family history of stroke: Secondary | ICD-10-CM

## 2022-03-11 DIAGNOSIS — Z91199 Patient's noncompliance with other medical treatment and regimen due to unspecified reason: Secondary | ICD-10-CM

## 2022-03-11 DIAGNOSIS — F1410 Cocaine abuse, uncomplicated: Secondary | ICD-10-CM | POA: Diagnosis present

## 2022-03-11 DIAGNOSIS — Z79899 Other long term (current) drug therapy: Secondary | ICD-10-CM | POA: Diagnosis not present

## 2022-03-11 DIAGNOSIS — M069 Rheumatoid arthritis, unspecified: Secondary | ICD-10-CM | POA: Diagnosis present

## 2022-03-11 DIAGNOSIS — M5430 Sciatica, unspecified side: Secondary | ICD-10-CM | POA: Diagnosis not present

## 2022-03-11 DIAGNOSIS — F411 Generalized anxiety disorder: Secondary | ICD-10-CM | POA: Diagnosis present

## 2022-03-11 DIAGNOSIS — F410 Panic disorder [episodic paroxysmal anxiety] without agoraphobia: Secondary | ICD-10-CM | POA: Diagnosis present

## 2022-03-11 DIAGNOSIS — M5431 Sciatica, right side: Secondary | ICD-10-CM | POA: Diagnosis present

## 2022-03-11 DIAGNOSIS — F6381 Intermittent explosive disorder: Secondary | ICD-10-CM | POA: Diagnosis present

## 2022-03-11 DIAGNOSIS — Z8249 Family history of ischemic heart disease and other diseases of the circulatory system: Secondary | ICD-10-CM

## 2022-03-11 DIAGNOSIS — M62838 Other muscle spasm: Secondary | ICD-10-CM | POA: Diagnosis present

## 2022-03-11 DIAGNOSIS — I10 Essential (primary) hypertension: Secondary | ICD-10-CM | POA: Diagnosis present

## 2022-03-11 DIAGNOSIS — F1510 Other stimulant abuse, uncomplicated: Secondary | ICD-10-CM | POA: Diagnosis present

## 2022-03-11 DIAGNOSIS — Z5900 Homelessness unspecified: Secondary | ICD-10-CM | POA: Diagnosis not present

## 2022-03-11 DIAGNOSIS — Z885 Allergy status to narcotic agent status: Secondary | ICD-10-CM

## 2022-03-11 DIAGNOSIS — G4700 Insomnia, unspecified: Secondary | ICD-10-CM | POA: Diagnosis present

## 2022-03-11 DIAGNOSIS — F4310 Post-traumatic stress disorder, unspecified: Secondary | ICD-10-CM | POA: Diagnosis present

## 2022-03-11 DIAGNOSIS — Q600 Renal agenesis, unilateral: Secondary | ICD-10-CM | POA: Diagnosis not present

## 2022-03-11 DIAGNOSIS — F319 Bipolar disorder, unspecified: Principal | ICD-10-CM | POA: Diagnosis present

## 2022-03-11 DIAGNOSIS — Z886 Allergy status to analgesic agent status: Secondary | ICD-10-CM

## 2022-03-11 DIAGNOSIS — F333 Major depressive disorder, recurrent, severe with psychotic symptoms: Secondary | ICD-10-CM | POA: Diagnosis present

## 2022-03-11 DIAGNOSIS — F332 Major depressive disorder, recurrent severe without psychotic features: Principal | ICD-10-CM

## 2022-03-11 LAB — RESP PANEL BY RT-PCR (FLU A&B, COVID) ARPGX2
Influenza A by PCR: NEGATIVE
Influenza B by PCR: NEGATIVE
SARS Coronavirus 2 by RT PCR: NEGATIVE

## 2022-03-11 MED ORDER — AMLODIPINE BESYLATE 5 MG PO TABS
5.0000 mg | ORAL_TABLET | Freq: Every day | ORAL | Status: DC
  Administered 2022-03-11: 5 mg via ORAL
  Filled 2022-03-11: qty 1

## 2022-03-11 MED ORDER — TRAZODONE HCL 50 MG PO TABS
50.0000 mg | ORAL_TABLET | Freq: Every evening | ORAL | Status: DC | PRN
  Administered 2022-03-11: 50 mg via ORAL
  Filled 2022-03-11: qty 1

## 2022-03-11 MED ORDER — SUMATRIPTAN SUCCINATE 25 MG PO TABS
25.0000 mg | ORAL_TABLET | Freq: Once | ORAL | Status: AC
  Administered 2022-03-11: 25 mg via ORAL
  Filled 2022-03-11 (×2): qty 1

## 2022-03-11 MED ORDER — LORATADINE 10 MG PO TABS
10.0000 mg | ORAL_TABLET | Freq: Every day | ORAL | Status: DC
  Administered 2022-03-11: 10 mg via ORAL
  Filled 2022-03-11: qty 1

## 2022-03-11 MED ORDER — SUMATRIPTAN SUCCINATE 25 MG PO TABS: 25.0000 mg | ORAL_TABLET | ORAL | Status: DC | PRN

## 2022-03-11 MED ORDER — MAGNESIUM HYDROXIDE 400 MG/5ML PO SUSP
30.0000 mL | Freq: Every day | ORAL | Status: DC | PRN
  Administered 2022-03-12: 30 mL via ORAL
  Filled 2022-03-11: qty 30

## 2022-03-11 MED ORDER — ACETAMINOPHEN 325 MG PO TABS
650.0000 mg | ORAL_TABLET | Freq: Three times a day (TID) | ORAL | Status: DC | PRN
Start: 2022-03-11 — End: 2022-03-11
  Filled 2022-03-11: qty 2

## 2022-03-11 MED ORDER — HYDROXYZINE HCL 25 MG PO TABS
25.0000 mg | ORAL_TABLET | Freq: Three times a day (TID) | ORAL | Status: DC | PRN
  Administered 2022-03-11 – 2022-03-15 (×7): 25 mg via ORAL
  Filled 2022-03-11 (×7): qty 1

## 2022-03-11 MED ORDER — ALUM & MAG HYDROXIDE-SIMETH 200-200-20 MG/5ML PO SUSP: 30.0000 mL | ORAL | Status: DC | PRN

## 2022-03-11 MED ORDER — ALBUTEROL SULFATE HFA 108 (90 BASE) MCG/ACT IN AERS: 2.0000 | INHALATION_SPRAY | Freq: Four times a day (QID) | RESPIRATORY_TRACT | Status: DC | PRN

## 2022-03-11 NOTE — Progress Notes (Signed)
Adult Psychoeducational Group Note  Date:  03/11/2022 Time:  9:48 PM  Group Topic/Focus:  NA  Participation Level:  Active  Participation Quality:  Attentive  Affect:  Appropriate  Cognitive:  Appropriate  Insight: Appropriate  Engagement in Group:  Engaged  Modes of Intervention:  Discussion  Additional Comments:    Pt was attentive and engaged in the NA/Wrap-Up group.  Briana Wyatt 03/11/2022, 9:48 PM

## 2022-03-11 NOTE — Plan of Care (Signed)
  Problem: Nutrition: Goal: Adequate nutrition will be maintained Outcome: Progressing   Problem: Elimination: Goal: Will not experience complications related to bowel motility Outcome: Progressing Goal: Will not experience complications related to urinary retention Outcome: Progressing   Problem: Coping: Goal: Level of anxiety will decrease Outcome: Not Progressing   Problem: Education: Goal: Utilization of techniques to improve thought processes will improve Outcome: Not Progressing Goal: Knowledge of the prescribed therapeutic regimen will improve Outcome: Not Progressing   Problem: Coping: Goal: Ability to verbalize frustrations and anger appropriately will improve Outcome: Not Progressing Goal: Ability to demonstrate self-control will improve Outcome: Not Progressing

## 2022-03-11 NOTE — ED Notes (Signed)
Tomi Bamberger, MD notified re: pts BP

## 2022-03-11 NOTE — Progress Notes (Signed)
Pt pending acceptance at [facility]. CSW delivered consent to [place] is awaiting assignment from [facility]. CSW will assist and follow.   Benjaman Kindler, MSW, [facility] 03/11/2022 3:19 PM
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
## 2022-03-11 NOTE — ED Notes (Signed)
Pt signed voluntary consent for treatment and secretary faxed doc to [facility]

## 2022-03-11 NOTE — Progress Notes (Signed)
Patient arrived to unit at 1800. Alert and oriented. Patient reports that she tried to overdose on Melatonin and Gabapentin. Reports living in a camper behind her mother's house. Patient reports that her mother is both her stressor and support system. Patient reports that she has anger issues and sometimes lashes out at her mother. She says her mom will not allow her to come back until she gets help. Patient reports that while she is here she would like to work on getting stable on her medications and learning ways to control her anger. Patient reports that in the past she self-harmed by picking at her skin creating sores. Patient has old scars on both legs from picking. Patient denies SI/HI at this time but says she has had thoughts in the past to overdose on medications. Denies alcohol and tobacco use. Patient did report that recently she relapsed and started using crack cocaine. Patient reports having anxiety, depression PTSD. Patient also reports that she suffers from chronic pain all over due to a diagnosis of  cervical and lumbar spondylosis.

## 2022-03-11 NOTE — Progress Notes (Signed)
Pt was accept to [facility] 03/11/22; Bed Assignment; Room 406-1.   Diagnosis MDD with psychotic features.   Pt meets inpatient criteria per Evette Georges, NP  Attending Physician will be Attending Dr. Nelda Marseille.  Report can be called to: -Adult unit: [phone]  Pt can arrive-Bed is ready   Care Team notified [facility] Clayborne Dana, RN, Waldon Merl, Counselor,  Sanda Linger, RN, Tawnya Crook, RN, [place], FNP, Kara Dies, RN.   Nadara Mode, LCSWA 03/11/2022 @ 4:57 PM

## 2022-03-11 NOTE — ED Provider Notes (Addendum)
Emergency Medicine Observation Re-evaluation Note  Briana Wyatt is a 51 y.o. female, seen on rounds today.  Pt initially presented to the ED for complaints of Drug Overdose Currently, the patient is sleeping.  Physical Exam  BP (!) 176/101 (BP Location: Left Arm)   Pulse 66   Temp 97.6 F (36.4 C) (Oral)   Resp 16   Ht 1.676 m ('5\' 6"'$ )   Wt 89.4 kg   SpO2 98%   BMI 31.80 kg/m  Physical Exam General: Resting Cardiac: Regular rate Lungs: Breathing easily Psych: Calm, resting  ED Course / MDM  EKG:   I have reviewed the labs performed to date as well as medications administered while in observation.  Recent changes in the last 24 hours include initial psychiatric evaluation.  Blood pressure also noted to be elevated.  We will recheck this morning  Plan  Current plan is for inpatient psychiatric treatment.  Briana Wyatt is not under involuntary commitment.     Dorie Rank, MD 03/11/22 [phone]  Blood pressure remains elevated.  Will add Norvasc    Dorie Rank, MD 03/11/22 [phone]

## 2022-03-12 ENCOUNTER — Encounter (HOSPITAL_COMMUNITY): Payer: Self-pay | Admitting: Emergency Medicine

## 2022-03-12 DIAGNOSIS — F4310 Post-traumatic stress disorder, unspecified: Secondary | ICD-10-CM

## 2022-03-12 DIAGNOSIS — F1590 Other stimulant use, unspecified, uncomplicated: Secondary | ICD-10-CM

## 2022-03-12 DIAGNOSIS — F6381 Intermittent explosive disorder: Secondary | ICD-10-CM

## 2022-03-12 DIAGNOSIS — F411 Generalized anxiety disorder: Secondary | ICD-10-CM

## 2022-03-12 LAB — URINALYSIS, COMPLETE (UACMP) WITH MICROSCOPIC
Bacteria, UA: NONE SEEN
Bilirubin Urine: NEGATIVE
Bilirubin Urine: NEGATIVE
Glucose, UA: NEGATIVE mg/dL
Glucose, UA: NEGATIVE mg/dL
Hgb urine dipstick: NEGATIVE
Hgb urine dipstick: NEGATIVE
Ketones, ur: NEGATIVE mg/dL
Ketones, ur: NEGATIVE mg/dL
Nitrite: NEGATIVE
Nitrite: NEGATIVE
Protein, ur: 30 mg/dL — AB
Protein, ur: 30 mg/dL — AB
Specific Gravity, Urine: 1.016 (ref 1.005–1.030)
Specific Gravity, Urine: 1.018 (ref 1.005–1.030)
pH: 7 (ref 5.0–8.0)
pH: 6 (ref 5.0–8.0)

## 2022-03-12 LAB — COMPREHENSIVE METABOLIC PANEL
ALT: 29 U/L (ref 0–44)
AST: 26 U/L (ref 15–41)
Albumin: 3.9 g/dL (ref 3.5–5.0)
Alkaline Phosphatase: 87 U/L (ref 38–126)
Anion gap: 9 (ref 5–15)
BUN: 22 mg/dL — ABNORMAL HIGH (ref 6–20)
CO2: 24 mmol/L (ref 22–32)
Calcium: 9.8 mg/dL (ref 8.9–10.3)
Chloride: 108 mmol/L (ref 98–111)
Creatinine, Ser: 1.08 mg/dL — ABNORMAL HIGH (ref 0.44–1.00)
GFR, Estimated: >60 mL/min (ref >60)
Glucose, Bld: 115 mg/dL — ABNORMAL HIGH (ref 70–99)
Potassium: 4.1 mmol/L (ref 3.5–5.1)
Sodium: 141 mmol/L (ref 135–145)
Total Bilirubin: 0.5 mg/dL (ref 0.3–1.2)
Total Protein: 7.2 g/dL (ref 6.5–8.1)

## 2022-03-12 LAB — HEMOGLOBIN A1C
Hgb A1c MFr Bld: 5.7 % — ABNORMAL HIGH (ref 4.8–5.6)
Mean Plasma Glucose: 116.89 mg/dL

## 2022-03-12 LAB — CBC
HCT: 42 % (ref 36.0–46.0)
Hemoglobin: 13.7 g/dL (ref 12.0–15.0)
MCH: 28.3 pg (ref 26.0–34.0)
MCHC: 32.6 g/dL (ref 30.0–36.0)
MCV: 86.8 fL (ref 80.0–100.0)
Platelets: 291 10*3/uL (ref 150–400)
RBC: 4.84 MIL/uL (ref 3.87–5.11)
RDW: 12.3 % (ref 11.5–15.5)
WBC: 8.1 10*3/uL (ref 4.0–10.5)
nRBC: 0 % (ref 0.0–0.2)

## 2022-03-12 LAB — RAPID URINE DRUG SCREEN, HOSP PERFORMED
Amphetamines: NOT DETECTED
Barbiturates: NOT DETECTED
Benzodiazepines: NOT DETECTED
Cocaine: NOT DETECTED
Opiates: NOT DETECTED
Tetrahydrocannabinol: NOT DETECTED

## 2022-03-12 LAB — TSH: TSH: 0.129 u[IU]/mL — ABNORMAL LOW (ref 0.350–4.500)

## 2022-03-12 MED ORDER — PANTOPRAZOLE SODIUM 40 MG PO TBEC
40.0000 mg | DELAYED_RELEASE_TABLET | Freq: Every day | ORAL | Status: DC
  Administered 2022-03-12 – 2022-03-16 (×5): 40 mg via ORAL
  Filled 2022-03-12 (×8): qty 1

## 2022-03-12 MED ORDER — QUETIAPINE FUMARATE 50 MG PO TABS
50.0000 mg | ORAL_TABLET | Freq: Every day | ORAL | Status: DC
  Administered 2022-03-12: 50 mg via ORAL
  Filled 2022-03-12 (×3): qty 1

## 2022-03-12 MED ORDER — TOPIRAMATE 25 MG PO TABS
25.0000 mg | ORAL_TABLET | Freq: Two times a day (BID) | ORAL | Status: DC
  Administered 2022-03-12 – 2022-03-16 (×8): 25 mg via ORAL
  Filled 2022-03-12: qty 1
  Filled 2022-03-12: qty 14
  Filled 2022-03-12 (×4): qty 1
  Filled 2022-03-12: qty 14
  Filled 2022-03-12 (×5): qty 1

## 2022-03-12 MED ORDER — SUMATRIPTAN SUCCINATE 25 MG PO TABS
25.0000 mg | ORAL_TABLET | Freq: Once | ORAL | Status: DC
  Filled 2022-03-12 (×2): qty 1

## 2022-03-12 MED ORDER — METFORMIN HCL ER 500 MG PO TB24
500.0000 mg | ORAL_TABLET | Freq: Every day | ORAL | Status: DC
  Administered 2022-03-13: 500 mg via ORAL
  Filled 2022-03-12 (×3): qty 1

## 2022-03-12 MED ORDER — ALBUTEROL SULFATE HFA 108 (90 BASE) MCG/ACT IN AERS: 1.0000 | INHALATION_SPRAY | RESPIRATORY_TRACT | Status: DC | PRN

## 2022-03-12 MED ORDER — TIZANIDINE HCL 2 MG PO TABS
2.0000 mg | ORAL_TABLET | Freq: Three times a day (TID) | ORAL | Status: DC | PRN
  Administered 2022-03-12 – 2022-03-15 (×7): 2 mg via ORAL
  Filled 2022-03-12 (×7): qty 1

## 2022-03-12 MED ORDER — DULOXETINE HCL 20 MG PO CPEP
20.0000 mg | ORAL_CAPSULE | Freq: Two times a day (BID) | ORAL | Status: DC
  Administered 2022-03-12 – 2022-03-13 (×3): 20 mg via ORAL
  Filled 2022-03-12 (×7): qty 1

## 2022-03-12 MED ORDER — LOSARTAN POTASSIUM 50 MG PO TABS
50.0000 mg | ORAL_TABLET | Freq: Every day | ORAL | Status: DC
  Administered 2022-03-12 – 2022-03-16 (×5): 50 mg via ORAL
  Filled 2022-03-12 (×7): qty 1

## 2022-03-12 MED ORDER — GABAPENTIN 300 MG PO CAPS
900.0000 mg | ORAL_CAPSULE | Freq: Two times a day (BID) | ORAL | Status: DC
Start: 2022-03-12 — End: 2022-03-16
  Administered 2022-03-12 – 2022-03-16 (×8): 900 mg via ORAL
  Filled 2022-03-12: qty 42
  Filled 2022-03-12 (×7): qty 3
  Filled 2022-03-12: qty 42
  Filled 2022-03-12 (×3): qty 3

## 2022-03-12 NOTE — [facility] Suicide Risk Assessment (Signed)
[facility] INPATIENT:  Family/Significant Other Suicide Prevention Education  Suicide Prevention Education:  Education Completed; Samantha Crimes, mother, [phone] has been identified by the patient as the family member/significant other with whom the patient will be residing, and identified as the person(s) who will aid the patient in the event of a mental health crisis (suicidal ideations/suicide attempt).  With written consent from the patient, the family member/significant other has been provided the following suicide prevention education, prior to the and/or following the discharge of the patient.  In addition to the precautions below, patient's support agrees to monitor patient for safety, ensure treatment compliance, and alert emergency services should patient require such services.   Describes the patient as "hell on wheels" and "belligerent."   Mother has agreed to come to facility to secure keys to patient's camper in order to complete safety sweep.   The suicide prevention education provided includes the following: Suicide risk factors Suicide prevention and interventions National Suicide Hotline telephone number [facility] assessment telephone number [facility] Emergency Assistance [place] and/or Residential Mobile Crisis Unit telephone number  Request made of family/significant other to: Remove weapons (e.g., guns, rifles, knives), all items previously/currently identified as safety concern.   Remove drugs/medications (over-the-counter, prescriptions, illicit drugs), all items previously/currently identified as a safety concern.  The family member/significant other verbalizes understanding of the suicide prevention education information provided.  The family member/significant other agrees to remove the items of safety concern listed above.  Durenda Hurt 03/12/2022, 11:35 AM
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
## 2022-03-12 NOTE — H&P (Addendum)
Psychiatric Admission Assessment Adult  Patient Identification: Briana Wyatt MRN:  [id number] Date of Evaluation:  03/12/2022 Chief Complaint:  MDD ( Major depressive disorder), recurrent, severe, without psychosis.  Principal Diagnosis: MDD ( Major depressive Disorder), recurrent, severe, without psychosis ([place]) Diagnosis:  MDD (Major Depressive Disorder), recurrent, severe, without psychosis ([place])   History of Present Illness: Briana Wyatt is a 51 year old female with past psychiatric history of major depressive disorder and crack cocaine use disorder. Pt has a documented history of suicidal ideations in the past and was recently evaluated at the [facility] 01/18/22 for SI with plan by asphyxiation.  Pt presented to voluntarily to [facility] on 03/10/22 for suicidal ideation and overdosing on her melatonin and gabapentin pills last week.   The patient was seen face to face by this provider for this evaluation on the [facility] adult unit. On evaluation, patient is alert, oriented x 3, and cooperative. Speech is clear, coherent and logical. Pt appears casual. Eye contact is fair. Mood is depressed and anxious, affect is congruent with mood. Thought process is logical and thought content is coherent. Pt denies SI/HI/AVH. There is no indication that the patient is responding to internal stimuli. No delusions elicited during this assessment.    Pt reports " I tried to kill myself 3 times within the past year and, I've self committed before. Pt reports " Last week I did take "87-10 mg melatonin pills, 10-800 mg gabapentin pills, and 24-3 mg melatonin pills". " I mixed them all together and took them in three handfuls within 6 hours, but they didn't work ".   On what her current stressors and triggers were for her overdose attempt, the patient reports " I never tried before, but I did have a plan, because if I got fed up". Pt reports that 2 weeks ago, I went to [place] and relapsed that weekend" " I lost my car  somewhere in [place]". Pt reports " I have valuables still in the car, but I don't know where it is and my family are disappointed at me and treated me like I didn't exist, so I decided I didn't want to exist anyway, that why I took the pills".  Pt reports she lives in a camper owned by her mom and stepfather behind her mom's house and one of the rules was no drug use. Pt reports that she relapsed on crack cocaine during her [place] trip. Pt reports " I fell out with mom and my step father". Pt reports she became suicidal and "mixed the pills and then went and gave mom her wallet and a piece of paper (note)with her password, saying this is my password for everything". Pt reports she then went back to her camper and called her sisters and said to them " this is what I leave you in my will, and then blocked everyone on the phone and took the first handful of pills". Pt reports her mom came to check on her and she took the second handful and then the cops showed up and she got pissed and took the third handful.    Pt reports " my plan was for my mom to find me dead in the morning, but  she called the cops and they took me to the hospital".   Pt reports "I have anger issues, and If I go home and do something and my mom calls the cops, I can't guarantee that I won't do something to die "suicide by cop". " Its one of my  plans". "They say if you kill yourself, you won't go to heaven, but I'm not afraid to die and go to hell". Pt reports " I need help with anger management". " The world is my trigger".   Pt provided support and encouragement about ongoing stressors.  Associated Signs/Symptoms: Depression Symptoms:  depressed mood, anhedonia, psychomotor agitation, fatigue, feelings of worthlessness/guilt, difficulty concentrating, hopelessness, impaired memory, recurrent thoughts of death, anxiety, loss of energy/fatigue, Duration of Depression Symptoms: More than two weeks. (Hypo) Manic Symptoms:   Impulsivity, Irritable Mood, Labiality of Mood,  PTSD Symptoms: Negative Total Time spent with patient: 1 hour  Past Psychiatric History: Pt reports history of "Bipolar, PTSD, GAD, Anger issues, depression, and crack cocaine use disorder". Per chart review "Patient has history of sciatica and depression.  She indicates that last week she attempted to overdose on melatonin and gabapentin because of her SI.  Normally she is triggered by her mother, and when she gets angry she will lash out by wanting to overdose.  She has chronic SI, but normally she will not attempt to overdose.  Patient also states that she was kicked out of the camper where she lived, as her mom owns it.  She is currently homeless.  She alleges that she talk to her psychiatrist or telehealth today and they encouraged her to go to the ER.  She also admits to relapsing on crack last week".   Pt reports she was placed on tegretol 2 years ago for her anger issues, and it helped a lot, but she stopped taking it due to crack use. Pt reports she took Celexa in the past for depression, and it made her feel bad, so she stopped taking it. Pt reports also taking Abilify and Lexapro together at some point, and it made her feel like she was jumping out of her skin and she almost jumped out of her car one time. Pt report she visits her therapist, psychiatrist and neurologist. PT reports " I'm getting a new therapist when I get out, old one not working".    Is the patient at risk to self? Yes.    Has the patient been a risk to self in the past 6 months? Yes.    Has the patient been a risk to self within the distant past? Yes.    Is the patient a risk to others? No.  Has the patient been a risk to others in the past 6 months? No.  Has the patient been a risk to others within the distant past? No.   Prior Inpatient Therapy:  Pt reports she started using drugs in May 2004, and hospitalized October 2004. Pt reports " since then, Ive been  hospitalized about 71 other times in different places such as [facility], and have self committed 3 times within the past year, but this is the first time I've tried to kill myself". " Pt reports "crack cocaine is my drug of choice, I didn't like using meth, causes me to pick my skin and get paranoid, Ive smoked weed before, and don't really drink".  Prior Outpatient Therapy:  Pt reports contact with  outpatient mental health services.   Alcohol Screening: 1. How often do you have a drink containing alcohol?: Never 2. How many drinks containing alcohol do you have on a typical day when you are drinking?: 1 or 2 3. How often do you have six or more drinks on one occasion?: Never AUDIT-C Score: 0 4. How often during the last  year have you found that you were not able to stop drinking once you had started?: Never 5. How often during the last year have you failed to do what was normally expected from you because of drinking?: Never 6. How often during the last year have you needed a first drink in the morning to get yourself going after a heavy drinking session?: Never 7. How often during the last year have you had a feeling of guilt of remorse after drinking?: Never 8. How often during the last year have you been unable to remember what happened the night before because you had been drinking?: Never 9. Have you or someone else been injured as a result of your drinking?: No 10. Has a relative or friend or a doctor or another health worker been concerned about your drinking or suggested you cut down?: No Alcohol Use Disorder Identification Test Final Score (AUDIT): 0 Substance Abuse History in the last 12 months:  Yes.   Consequences of Substance Abuse: Family Consequences:  Evicted by mom from camper.  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 Social History: Lives alone, unemployed.  Social History   Substance and Sexual Activity  Alcohol Use No     Social History   Substance and Sexual Activity  Drug Use Not Currently   Types: Marijuana, Cocaine   Comment: denies use 10/02/21    Additional Social History:  Lives behind mom's house in a camper. Pt reports she is currently homeless and unable to return to the camper unless she gets help. Pt reports " I've burned out my other chances with friends and family due to anger, the whole world is my trigger". Pt reports she used to love writing, but don't anymore, making her hold her feelings in, which builds up and eventually bursts.    Allergies:   Allergies  Allergen Reactions   Codeine Nausea And Vomiting    High doses of codeine pt reports greater than '30mg'$  she can't tolerate    Methocarbamol Itching    "feels like bugs are crawling under skin"   Nsaids     Other reaction(s): Contraindicated CKD, only one kidney Other reaction(s): Contraindicated CKD, only one kidney   Lab Results:  Results for orders placed or performed during the hospital encounter of 03/10/22 (from the past 48 hour(s))  Rapid urine drug screen (hospital performed)     Status: None   Collection Time: 03/10/22  6:10 PM  Result Value Ref Range   Opiates NONE DETECTED NONE DETECTED   Cocaine NONE DETECTED NONE DETECTED   Benzodiazepines NONE DETECTED NONE DETECTED   Amphetamines NONE DETECTED NONE DETECTED    Tetrahydrocannabinol NONE DETECTED NONE DETECTED   Barbiturates NONE DETECTED NONE DETECTED    Comment: (NOTE) DRUG SCREEN  FOR MEDICAL PURPOSES ONLY.  IF CONFIRMATION IS NEEDED FOR ANY PURPOSE, NOTIFY LAB WITHIN 5 DAYS.  LOWEST DETECTABLE LIMITS FOR URINE DRUG SCREEN Drug Class                     Cutoff (ng/mL) Amphetamine and metabolites    1000 Barbiturate and metabolites    200 Benzodiazepine                 277 Tricyclics and metabolites     300 Opiates and metabolites        300 Cocaine and metabolites        300 THC                            50 Performed at [facility] [place], [place] [address]   Comprehensive metabolic panel     Status: None   Collection Time: 03/10/22  6:35 PM  Result Value Ref Range   Sodium 139 135 - 145 mmol/L   Potassium 3.6 3.5 - 5.1 mmol/L   Chloride 108 98 - 111 mmol/L   CO2 26 22 - 32 mmol/L   Glucose, Bld 83 70 - 99 mg/dL    Comment: Glucose reference range applies only to samples taken after fasting for at least 8 hours.   BUN 17 6 - 20 mg/dL   Creatinine, Ser 1.00 0.44 - 1.00 mg/dL   Calcium 8.9 8.9 - 10.3 mg/dL   Total Protein 6.7 6.5 - 8.1 g/dL   Albumin 3.7 3.5 - 5.0 g/dL   AST 23 15 - 41 U/L   ALT 29 0 - 44 U/L   Alkaline Phosphatase 89 38 - 126 U/L   Total Bilirubin 0.4 0.3 - 1.2 mg/dL   GFR, Estimated >60 >60 mL/min    Comment: (NOTE) Calculated using the CKD-EPI Creatinine Equation (2021)    Anion gap 5 5 - 15    Comment: Performed at [facility], [address] [place], [place] [address]  Ethanol     Status: None   Collection Time: 03/10/22  6:35 PM  Result Value Ref Range   Alcohol, Ethyl (B) <10 <10 mg/dL    Comment: (NOTE) Lowest detectable limit for serum alcohol is 10 mg/dL.  For medical purposes only. Performed at [facility], [address], [place], [place] [address]   Salicylate level     Status: Abnormal   Collection Time: 03/10/22  6:35 PM  Result Value Ref Range   Salicylate Lvl <0.0  (L) 7.0 - 30.0 mg/dL    Comment: Performed at [facility], [address] [place], [place] [address]  Acetaminophen level     Status: Abnormal   Collection Time: 03/10/22  6:35 PM  Result Value Ref Range   Acetaminophen (Tylenol), Serum <10 (L) 10 - 30 ug/mL    Comment: (NOTE) Therapeutic concentrations vary significantly. A range of 10-30 ug/mL  may be an effective concentration for many patients. However, some  are best treated at concentrations outside of this range. Acetaminophen concentrations >150 ug/mL at 4 hours after ingestion  and >50 ug/mL at 12 hours after ingestion are often associated with  toxic reactions.  Performed at [facility], [address] [place], [place] [address]   cbc     Status: None   Collection Time: 03/10/22  6:35 PM  Result Value Ref Range   WBC 6.3 4.0 - 10.5 K/uL   RBC  4.24 3.87 - 5.11 MIL/uL   Hemoglobin 12.0 12.0 - 15.0 g/dL   HCT 36.5 36.0 - 46.0 %   MCV 86.1 80.0 - 100.0 fL   MCH 28.3 26.0 - 34.0 pg   MCHC 32.9 30.0 - 36.0 g/dL   RDW 12.1 11.5 - 15.5 %   Platelets 252 150 - 400 K/uL   nRBC 0.0 0.0 - 0.2 %    Comment: Performed at [facility], [address], [place], [place] [address]  Resp Panel by RT-PCR (Flu A&B, Covid) Anterior Nasal Swab     Status: None   Collection Time: 03/11/22  5:45 AM   Specimen: Anterior Nasal Swab  Result Value Ref Range   SARS Coronavirus 2 by RT PCR NEGATIVE NEGATIVE    Comment: (NOTE) SARS-CoV-2 target nucleic acids are NOT DETECTED.  The SARS-CoV-2 RNA is generally detectable in upper respiratory specimens during the acute phase of infection. The lowest concentration of SARS-CoV-2 viral copies this assay can detect is 138 copies/mL. A negative result does not preclude SARS-Cov-2 infection and should not be used as the sole basis for treatment or other patient management decisions. A negative result may occur with  improper specimen collection/handling, submission of specimen other than  nasopharyngeal swab, presence of viral mutation(s) within the areas targeted by this assay, and inadequate number of viral copies(<138 copies/mL). A negative result must be combined with clinical observations, patient history, and epidemiological information. The expected result is Negative.  Fact Sheet for Patients:  EntrepreneurPulse.com.au  Fact Sheet for Healthcare Providers:  IncredibleEmployment.be  This test is no t yet approved or cleared by the [place] FDA and  has been authorized for detection and/or diagnosis of SARS-CoV-2 by FDA under an Emergency Use Authorization (EUA). This EUA will remain  in effect (meaning this test can be used) for the duration of the COVID-19 declaration under Section 564(b)(1) of the Act, 21 U.S.C.section 360bbb-3(b)(1), unless the authorization is terminated  or revoked sooner.       Influenza A by PCR NEGATIVE NEGATIVE   Influenza B by PCR NEGATIVE NEGATIVE    Comment: (NOTE) The Xpert Xpress SARS-CoV-2/FLU/RSV plus assay is intended as an aid in the diagnosis of influenza from Nasopharyngeal swab specimens and should not be used as a sole basis for treatment. Nasal washings and aspirates are unacceptable for Xpert Xpress SARS-CoV-2/FLU/RSV testing.  Fact Sheet for Patients: EntrepreneurPulse.com.au  Fact Sheet for Healthcare Providers: IncredibleEmployment.be  This test is not yet approved or cleared by the [place] FDA and has been authorized for detection and/or diagnosis of SARS-CoV-2 by FDA under an Emergency Use Authorization (EUA). This EUA will remain in effect (meaning this test can be used) for the duration of the COVID-19 declaration under Section 564(b)(1) of the Act, 21 U.S.C. section 360bbb-3(b)(1), unless the authorization is terminated or revoked.  Performed at [facility], [address], [place], [place] [address]     Blood Alcohol  level:  Lab Results  Component Value Date   [facility] <10 03/10/2022   ETH <10 34/19/3790    Metabolic Disorder Labs:  Lab Results  Component Value Date   HGBA1C 5.8 (H) 10/08/2021   MPG 119.76 10/08/2021   MPG 122.63 09/17/2020   No results found for: "PROLACTIN" Lab Results  Component Value Date   CHOL 153 10/08/2021   TRIG 107 10/08/2021   HDL 42 10/08/2021   CHOLHDL 3.6 10/08/2021   VLDL 21 10/08/2021   LDLCALC 90 10/08/2021   LDLCALC 107 (  H) 09/17/2020    Current Medications: Current Facility-Administered Medications  Medication Dose Route Frequency Provider Last Rate Last Admin   alum & mag hydroxide-simeth (MAALOX/MYLANTA) 200-200-20 MG/5ML suspension 30 mL  30 mL Oral Q4H PRN Ntuen, Kris Hartmann, FNP       hydrOXYzine (ATARAX) tablet 25 mg  25 mg Oral TID PRN Laretta Bolster, FNP   25 mg at 03/11/22 1909   magnesium hydroxide (MILK OF MAGNESIA) suspension 30 mL  30 mL Oral Daily PRN Ntuen, Kris Hartmann, FNP       traZODone (DESYREL) tablet 50 mg  50 mg Oral QHS PRN Ntuen, Kris Hartmann, FNP   50 mg at 03/11/22 2208   PTA Medications: Medications Prior to Admission  Medication Sig Dispense Refill Last Dose   acetaminophen (TYLENOL) 650 MG CR tablet Take 650 mg by mouth every 8 (eight) hours as needed for pain or fever.      albuterol (VENTOLIN HFA) 108 (90 Base) MCG/ACT inhaler INHALE 2 PUFFS BY MOUTH EVERY 6 HOURS AS NEEDED FOR COUGHING, WHEEZING, OR SHORTNESS OF BREATH (Patient taking differently: Inhale 2 puffs into the lungs every 6 (six) hours as needed for wheezing or shortness of breath.) 20.1 g 0    Calcium Carbonate-Vitamin D (CALTRATE 600+D PO) Take by mouth.      cetirizine (ZYRTEC) 10 MG tablet Take 10 mg by mouth daily.      Cholecalciferol (VITAMIN D-3 PO) Take 1 tablet by mouth daily.      CYMBALTA 20 MG capsule Take 20 mg by mouth 2 (two) times daily.      DULoxetine (CYMBALTA) 30 MG capsule Take 1 capsule (30 mg total) by mouth daily. (Patient not taking: Reported on  03/10/2022) 30 capsule 0    Erenumab-aooe (AIMOVIG, 140 MG DOSE, Adin) One a month      gabapentin (NEURONTIN) 300 MG capsule Take 2 capsules (600 mg total) by mouth 2 (two) times daily. (Patient taking differently: Take 900 mg by mouth 2 (two) times daily.) 120 capsule 0    losartan (COZAAR) 50 MG tablet TAKE 1 Tablet BY MOUTH ONCE EVERY DAY (Patient taking differently: Take 50 mg by mouth daily.) 90 tablet 0    Multiple Vitamins-Minerals (CENTRUM SILVER PO) Take 1 tablet by mouth daily.      omeprazole (PRILOSEC) 40 MG capsule Take 40 mg by mouth daily.      rizatriptan (MAXALT) 10 MG tablet Take 10 mg by mouth as needed for migraine.      SUMAtriptan (IMITREX) 25 MG tablet Take 25 mg by mouth every 2 (two) hours as needed for migraine. (Patient not taking: Reported on 03/10/2022)      tiZANidine (ZANAFLEX) 2 MG tablet Take 2 mg by mouth 2 (two) times daily.      traMADol (ULTRAM) 50 MG tablet TAKE 1 TABLET(50 MG) BY MOUTH EVERY 12 HOURS AS NEEDED (Patient not taking: Reported on 03/10/2022) 30 tablet 0     Musculoskeletal: Strength & Muscle Tone: within normal limits Gait & Station: normal Patient leans: N/A   Psychiatric Specialty Exam:  Presentation  General Appearance: Casual; Appropriate for Environment  Eye Contact:Fair  Speech:Clear and Coherent; Normal Rate  Speech Volume:Normal  Handedness:Right   Mood and Affect  Mood:Depressed  Affect:Depressed   Thought Process  Thought Processes:Coherent; Goal Directed; Linear  Duration of Psychotic Symptoms: No data recorded Past Diagnosis of Schizophrenia or Psychoactive disorder: No data recorded Descriptions of Associations:Intact  Orientation:Full (Time, Place and Person)  Thought Content:Logical;  WDL  Hallucinations:No data recorded Ideas of Reference:None  Suicidal Thoughts:No data recorded Homicidal Thoughts:No data recorded  Sensorium  Memory:Immediate Good; Recent Fair  Judgment:Fair  Insight:Present;  Shallow   Executive Functions  Concentration:Fair  Attention Span:Fair  [place]  Language:Good   Psychomotor Activity  Psychomotor Activity:No data recorded  Assets  Assets:Communication Skills; Desire for Improvement; Financial Resources/Insurance; Housing; Intimacy; Leisure Time; Resilience   Sleep  Sleep:No data recorded   Physical Exam: Physical Exam Constitutional:      General: She is not in acute distress.    Appearance: She is not diaphoretic.  HENT:     Head: Normocephalic.     Right Ear: External ear normal.     Left Ear: External ear normal.     Nose: No congestion.  Eyes:     General:        Right eye: No discharge.        Left eye: No discharge.  Cardiovascular:     Rate and Rhythm: Normal rate.  Pulmonary:     Effort: No respiratory distress.  Chest:     Chest wall: No tenderness.  Neurological:     Mental Status: She is alert and oriented to person, place, and time.  Psychiatric:        Attention and Perception: Attention and perception normal.        Mood and Affect: Mood is depressed. Affect is blunt.        Speech: Speech normal.        Behavior: Behavior is cooperative.        Thought Content: Thought content is not paranoid or delusional. Thought content does not include homicidal or suicidal ideation. Thought content does not include homicidal or suicidal plan.        Cognition and Memory: Cognition and memory normal.        Judgment: Judgment is impulsive.    Review of Systems  Constitutional:  Negative for chills, diaphoresis and fever.  HENT:  Negative for congestion.   Eyes:  Negative for discharge.  Respiratory:  Negative for cough, shortness of breath and wheezing.   Cardiovascular:  Negative for chest pain and palpitations.  Gastrointestinal:  Negative for diarrhea, nausea and vomiting.  Neurological:  Negative for dizziness, seizures, loss of consciousness and weakness.  Psychiatric/Behavioral:   Positive for depression. Negative for hallucinations, substance abuse and suicidal ideas. The patient is not nervous/anxious and does not have insomnia.    Blood pressure (!) 132/97, pulse 86, temperature 97.7 F (36.5 C), temperature source Oral, resp. rate 18, height '5\' 6"'$  (1.676 m), weight 89.2 kg, SpO2 98 %. Body mass index is 31.73 kg/m.  Treatment Plan Summary: Daily contact with patient to assess and evaluate symptoms and progress in treatment and Medication management  Observation Level/Precautions:  15 minute checks  Laboratory:  Labs reviewed  Psychotherapy:  Unit group sessions  Medications:  See mar  Consultations:  TBD  Discharge Concerns:  safety, medication compliance, Mood instability, housing instability  Estimated LOS: 5-7 days  Other:  N/A    Assessment Principal Problem:   MDD (major depressive disorder), recurrent, severe, without psychosis ([place])  Active problems Homelessness Crack Cocaine use disorder Suicidal ideations  PLAN 1: Safety and Monitoring -Voluntary admission to in-patient psychiatric unit for safety, stabilization and treatment -Daily contact with patient to assess and evaluate symptoms and progress in treatment -Patient's case to be discussed in multi-disciplinary team meeting -Observation level" q15 minute checks -Vital signs:  q12 hours -Precautions: Safety  Physician Treatment Plan for Primary Diagnosis: MDD (major depressive disorder), recurrent, severe, with psychosis ([place]) Long Term Goal(s): Improvement in symptoms so as ready for discharge  Short Term Goals: Ability to identify changes in lifestyle to reduce recurrence of condition will improve, Ability to verbalize feelings will improve, Ability to disclose and discuss suicidal ideas, Ability to demonstrate self-control will improve, Ability to identify and develop effective coping behaviors will improve, Ability to maintain clinical measurements within normal limits will improve,  Compliance with prescribed medications will improve, and Ability to identify triggers associated with substance abuse/mental health issues will improve  Diagnosis Principal problem MDD (major depressive disorder), recurrent, severe, without psychosis ([place])  1: Medication  #MDD -Restart Duloxetine 20 mg PO BID for MDD. Previously trialed 30 mg PO daily 01/18/22. Pt non-compliant and stopped taking.   2: #Sciatica -Restart Tizanidine  2 mg PO Q8h prn for muscle spasm/sciatica. Previously trialed.  3: #Hypertension -Continue Losartan 50 mg PO daily for hypertension. Previously trialed.  Other prns -continue Maalox 30 mg PO every 4 hours as needed for indigestion -continue hydroxyzine 25 mg PO TID prn for anxiety -continue milk of magnesia as needed 30 ml PO daily for constipation -continue Trazodone 50 mg PO Qhs as needed for sleep -Sumatriptan (Imitrex 25 mg PO once for Migrane/Headache  2.UDS 03/10/22 negative.   Labs reviewed in last 48 hours: CMP, CBC, BAC, UDS, WNL  Labs scheduled in Am: See active orders.  3: Group and Therapy -Encourage patient to participate in unit milieu and in scheduled group therapies -Substance use counseling during in-patient hospitalization  4: Discharge

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
## 2022-03-12 NOTE — Progress Notes (Addendum)
Pt denies SI/HI/AVH and verbally agrees to approach staff if these become apparent or before harming themselves/others. Rates depression 0/10. Rates anxiety 6/10. Rates pain 8/10. Pt is very talkative. Pt will tell joke after joke. Pt has been out of the room for most of the day. Pt talked about sister multiple times. Pt was concerned about the Seroquel she was being put on since her sister had been on it and it had made her tired. Pt reassured and pt is willing to try. Pt stated that she started having a stabbing pain in her right hip/groin area. Pt stated that the pain has radiated more to her abdomen now. Pt stated that another doctor before admission told her it could be her hip bone rubbing on each other but she was still concerned about it. Scheduled medications administered to pt, per MD orders. RN provided support and encouragement to pt. Q15 min safety checks implemented and continued. Pt safe on the unit. RN will continue to monitor and intervene as needed.   03/12/22 0749  Psych Admission Type (Psych Patients Only)  Admission Status Voluntary  Psychosocial Assessment  Patient Complaints Anxiety  Eye Contact Fair  Facial Expression Animated  Affect Anxious;Sad;Flat  Speech Logical/coherent;Soft;Slow  Interaction Assertive  Motor Activity Hyperactive  Appearance/Hygiene Production designer, theatre/television/film Cooperative;Anxious;Hyperactive  Mood Anxious;Sad  Thought Process  Coherency WDL  Content WDL  Delusions None reported or observed  Perception WDL  Hallucination None reported or observed  Judgment Impaired  Confusion None  Danger to Self  Current suicidal ideation? Denies  Danger to Others  Danger to Others None reported or observed

## 2022-03-12 NOTE — Progress Notes (Signed)
Adult Psychoeducational Group Note  Date:  03/12/2022 Time:  10:22 PM  Group Topic/Focus:  Wrap-Up Group:   The focus of this group is to help patients review their daily goal of treatment and discuss progress on daily workbooks.  Participation Level:  Did Not Attend  Participation Quality:   n/a  Affect:   n/a  Cognitive:   n/a  Insight: None  Engagement in Group:   n/a  Modes of Intervention:   n/a  Additional Comments:   Pt did not attend the Wrap Up group.  Wetzel Bjornstad Claudell Rhody 03/12/2022, 10:22 PM

## 2022-03-12 NOTE — [facility] Group Notes (Signed)
Adult Psychoeducational Group Note  Date:  03/12/2022 Time:  2:33 PM  Group Topic/Focus:  Wellness Toolbox:   The focus of this group is to discuss various aspects of wellness, balancing those aspects and exploring ways to increase the ability to experience wellness.  Patients will create a wellness toolbox for use upon discharge.  Participation Level:  Active  Participation Quality:  Attentive  Affect:  Appropriate  Cognitive:  Alert  Insight: Appropriate  Engagement in Group:  Engaged  Modes of Intervention:  Activity  Additional Comments:  Patient attended and participated in the relaxation group activity.  Annie Sable 03/12/2022, 2:33 PM

## 2022-03-13 ENCOUNTER — Encounter (HOSPITAL_COMMUNITY): Payer: Self-pay

## 2022-03-13 LAB — VITAMIN B12: Vitamin B-12: 667 pg/mL (ref 180–914)

## 2022-03-13 LAB — LIPID PANEL
Cholesterol: 218 mg/dL — ABNORMAL HIGH (ref 0–200)
HDL: 38 mg/dL — ABNORMAL LOW (ref >40)
LDL Cholesterol: 107 mg/dL — ABNORMAL HIGH (ref 0–99)
Total CHOL/HDL Ratio: 5.7 RATIO
Triglycerides: 366 mg/dL — ABNORMAL HIGH (ref <150)
VLDL: 73 mg/dL — ABNORMAL HIGH (ref 0–40)

## 2022-03-13 LAB — THYROID ANTIBODIES
Thyroglobulin Antibody: <1 IU/mL (ref 0.0–0.9)
Thyroperoxidase Ab SerPl-aCnc: 30 IU/mL (ref 0–34)

## 2022-03-13 LAB — THYROID STIMULATING IMMUNOGLOBULIN: Thyroid Stimulating Immunoglob: <0.1 IU/L (ref 0.00–0.55)

## 2022-03-13 LAB — T4, FREE: Free T4: 0.77 ng/dL (ref 0.61–1.12)

## 2022-03-13 LAB — VITAMIN D 25 HYDROXY (VIT D DEFICIENCY, FRACTURES): Vit D, 25-Hydroxy: 31.97 ng/mL (ref 30–100)

## 2022-03-13 MED ORDER — SUMATRIPTAN SUCCINATE 25 MG PO TABS
25.0000 mg | ORAL_TABLET | ORAL | Status: DC | PRN
  Administered 2022-03-13 – 2022-03-15 (×6): 25 mg via ORAL
  Filled 2022-03-13 (×6): qty 1

## 2022-03-13 MED ORDER — DULOXETINE HCL 30 MG PO CPEP
30.0000 mg | ORAL_CAPSULE | Freq: Two times a day (BID) | ORAL | Status: DC
  Administered 2022-03-13 – 2022-03-16 (×6): 30 mg via ORAL
  Filled 2022-03-13 (×3): qty 1
  Filled 2022-03-13: qty 14
  Filled 2022-03-13 (×2): qty 1
  Filled 2022-03-13: qty 14
  Filled 2022-03-13 (×3): qty 1

## 2022-03-13 MED ORDER — TRAZODONE HCL 50 MG PO TABS
50.0000 mg | ORAL_TABLET | Freq: Every day | ORAL | Status: DC
  Administered 2022-03-13: 50 mg via ORAL
  Filled 2022-03-13 (×4): qty 1

## 2022-03-13 MED ORDER — SUMATRIPTAN SUCCINATE 25 MG PO TABS
25.0000 mg | ORAL_TABLET | Freq: Once | ORAL | Status: AC
  Administered 2022-03-13: 25 mg via ORAL
  Filled 2022-03-13: qty 1

## 2022-03-13 MED ORDER — ACETAMINOPHEN 325 MG PO TABS
650.0000 mg | ORAL_TABLET | Freq: Four times a day (QID) | ORAL | Status: DC | PRN
  Administered 2022-03-13 – 2022-03-16 (×4): 650 mg via ORAL
  Filled 2022-03-13 (×4): qty 2

## 2022-03-13 NOTE — Progress Notes (Addendum)
Patient is alert and oriented X's 4 during shift assessment. She is complaint with routine medication, tolerated them well with no side effect noted.  Patient  affect is flat, she is isolative, and stays in bed a lot. Patient is encouraged to attend groups and socialize with peers. Patient denies SI/HI/AVH. Patient states "I'm not suicidal at this time because I'm not with toxic people that made me angry." She further said, "I said what I said, because I was angry." Patient denies pain and any discomfort. No distress noted at this time. Staff will continue to provide support to support.

## 2022-03-13 NOTE — Progress Notes (Signed)
[facility] MD Progress Note  03/13/2022 4:44 PM Briana Wyatt  MRN:  [id number]  HPI:  This is a 51 year old female with a reported past psychiatric history of bipolar disorder, PTSD, GAD, panic disorder, who was admitted to the psychiatric unit for worsening depression and suicidal thoughts, after suicide attempt by overdose  2 times within the week prior to the admission.  24 hour chart review: Vital signs over the past 24 hrs have been mostly WNL. As per nursing documentation, she has attended some group sessions, and has been appropriate and attentive, but anxious. Pt took scheduled dose of Seroquel last night for mood/sleep, and has been compliant with scheduled meds for the past 24 hrs.  Today's patient assessment: Pt presents today with flat affect and depressed mood.  She reports feeling "cranky, on edge, and irritable". She reports that the scheduled Seroquel last night made her "loopy", and "high", and states that she will not take any more of the Seroquel.  Attention to personal hygiene and grooming is poor, and the need to tend to personal hygiene and grooming reiterated. Eye contact is good, speech is clear & coherent. Thought contents are organized and logical, and pt currently denies SI/HI She reports +AH of muffled voices coming out of her air conditioning unit last night. She denies VH, denies paranoia. There is no evidence of delusional thoughts.    Pt reports that her sleep quality last night was poor. She reports "weird dreams", and continues to state that the Seroquel caused this. She reports that her appetite is fair. We will discontinue the Seroquel as per pt's request. We will increase her Cymbalta from 20 mg BID to 30 mg BID for management of depressive symptoms and pain. We will order Imitrex PRN for migraines. Pt complained of poor sleep, but Trazodone was not given last night. Will keep Trazodone at 50 mg nightly, and will change it from PRN to scheduled. We are continuing other  medications as listed below.  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   Hypertension    Restless leg syndrome    Sciatica of right side     Past Surgical History:  Procedure Laterality Date   CARPAL TUNNEL RELEASE Bilateral    ECTOPIC PREGNANCY SURGERY     SHOULDER SURGERY Right    TUBAL LIGATION     Family History:  Family History  Problem Relation Age of Onset   Hypertension Father    Stroke Father    Family Psychiatric  History: none reported Social History:  Social History   Substance and Sexual Activity  Alcohol Use No     Social History   Substance and Sexual Activity  Drug Use Yes   Types: "Crack" cocaine   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    Social History   Socioeconomic History   Marital status: Single    Spouse name: Not on file   Number of children: 3   Years of education: Not  on file   Highest education level: GED or equivalent  Occupational History   Not on file  Tobacco Use   Smoking status: Former    Types: Cigarettes    Quit date: 08/25/1995    Years since quitting: 26.5    Passive exposure: Past   Smokeless tobacco: Never  Vaping Use   Vaping Use: Never used  Substance and Sexual Activity   Alcohol use: No   Drug use: Yes    Types: "Crack" cocaine    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   Sexual activity: Not Currently    Birth  control/protection: Surgical  Other Topics Concern   Not on file  Social History Narrative   Not on file   Social Determinants of Health   Financial Resource Strain: Not on file  Food Insecurity: No Food Insecurity (11/06/2021)   Hunger Vital Sign    Worried About Running Out of Food in the Last Year: Never true    Ran Out of Food in the Last Year: Never true  Transportation Needs: No Transportation Needs (11/06/2021)   PRAPARE - Hydrologist (Medical): No    Lack of Transportation (Non-Medical): No  Physical Activity: Not on file  Stress: Not on file  Social Connections: Not on file   Additional Social History:   Sleep: Poor  Appetite:  Fair  Current Medications: Current Facility-Administered Medications  Medication Dose Route Frequency Provider Last Rate Last Admin   acetaminophen (TYLENOL) tablet 650 mg  650 mg Oral Q6H PRN Nicholes Rough, NP   650 mg at 03/13/22 1321   albuterol (VENTOLIN HFA) 108 (90 Base) MCG/ACT inhaler 1-2 puff  1-2 puff Inhalation Q4H PRN Massengill, Ovid Curd, MD       alum & mag hydroxide-simeth (MAALOX/MYLANTA) 200-200-20 MG/5ML suspension 30 mL  30 mL Oral Q4H PRN Ntuen, Kris Hartmann, FNP       DULoxetine (CYMBALTA) DR capsule 30 mg  30 mg Oral BID Massengill, Nathan, MD       gabapentin (NEURONTIN) capsule 900 mg  900 mg Oral BID Massengill, Ovid Curd, MD   900 mg at 03/13/22 0805   hydrOXYzine (ATARAX) tablet 25 mg  25 mg Oral TID PRN Laretta Bolster, FNP   25 mg at 03/13/22 1303   losartan (COZAAR) tablet 50 mg  50 mg Oral Daily Onuoha, Chinwendu V, NP   50 mg at 03/13/22 0806   magnesium hydroxide (MILK OF MAGNESIA) suspension 30 mL  30 mL Oral Daily PRN Ntuen, Otila Kluver C, FNP   30 mL at 03/12/22 1151   pantoprazole (PROTONIX) EC tablet 40 mg  40 mg Oral Daily Massengill, Ovid Curd, MD   40 mg at 03/13/22 0806   SUMAtriptan (IMITREX) tablet 25 mg  25 mg Oral Once Onuoha, Chinwendu V, NP       SUMAtriptan (IMITREX) tablet 25 mg  25 mg Oral  Q2H PRN Massengill, Nathan, MD       tiZANidine (ZANAFLEX) tablet 2 mg  2 mg Oral Q8H PRN Onuoha, Chinwendu V, NP   2 mg at 03/13/22 1304   topiramate (TOPAMAX) tablet 25 mg  25 mg Oral BID Massengill, Ovid Curd, MD   25 mg at 03/13/22 0806   traZODone (DESYREL) tablet 50 mg  50 mg Oral QHS Nicholes Rough, NP        Lab Results:  Results for orders placed or performed during the hospital encounter of 03/11/22 (from the past 48 hour(s))  Urinalysis, Complete w Microscopic Urine, Clean Catch     Status: Abnormal   Collection Time: 03/11/22  6:38 PM  Result Value Ref Range   Color, Urine YELLOW YELLOW   APPearance HAZY (A) CLEAR   Specific Gravity, Urine 1.016 1.005 - 1.030   pH 7.0 5.0 - 8.0   Glucose, UA NEGATIVE NEGATIVE mg/dL   Hgb urine dipstick NEGATIVE NEGATIVE   Bilirubin Urine NEGATIVE NEGATIVE   Ketones, ur NEGATIVE NEGATIVE mg/dL   Protein, ur 30 (A) NEGATIVE mg/dL   Nitrite NEGATIVE NEGATIVE   Leukocytes,Ua MODERATE (A) NEGATIVE   RBC / HPF 0-5 0 - 5 RBC/hpf   WBC, UA 11-20 0 - 5 WBC/hpf   Bacteria, UA RARE (A) NONE SEEN   Squamous Epithelial / LPF 0-5 0 - 5   Mucus PRESENT     Comment: Performed at [facility], [place] [address], [place], [place] [address]  Rapid urine drug screen (hospital performed) not at [facility]     Status: None   Collection Time: 03/11/22  6:38 PM  Result Value Ref Range   Opiates NONE DETECTED NONE DETECTED   Cocaine NONE DETECTED NONE DETECTED   Benzodiazepines NONE DETECTED NONE DETECTED   Amphetamines NONE DETECTED NONE DETECTED   Tetrahydrocannabinol NONE DETECTED NONE DETECTED   Barbiturates NONE DETECTED NONE DETECTED    Comment: (NOTE) DRUG SCREEN FOR MEDICAL PURPOSES ONLY.  IF CONFIRMATION IS NEEDED FOR ANY PURPOSE, NOTIFY LAB WITHIN 5 DAYS.  LOWEST DETECTABLE LIMITS FOR URINE DRUG SCREEN Drug Class                     Cutoff (ng/mL) Amphetamine and metabolites    1000 Barbiturate and metabolites    200 Benzodiazepine                  782 Tricyclics and metabolites     300 Opiates and metabolites        300 Cocaine and metabolites        300 THC                            50 Performed at [facility], [place] [address] [place], [place] [address]   CBC     Status: None   Collection Time: 03/12/22  6:23 AM  Result Value Ref Range   WBC 8.1 4.0 - 10.5 K/uL   RBC 4.84 3.87 - 5.11 MIL/uL   Hemoglobin 13.7 12.0 - 15.0 g/dL   HCT 42.0 36.0 - 46.0 %   MCV 86.8 80.0 - 100.0 fL   MCH 28.3 26.0 - 34.0 pg   MCHC 32.6 30.0 - 36.0 g/dL   RDW 12.3 11.5 - 15.5 %   Platelets 291 150 - 400 K/uL   nRBC 0.0 0.0 - 0.2 %    Comment: Performed at [facility], [place] [address], [place], [place] [address]  Comprehensive metabolic panel     Status: Abnormal   Collection Time: 03/12/22  6:23 AM  Result Value Ref Range   Sodium 141 135 - 145 mmol/L   Potassium 4.1 3.5 - 5.1 mmol/L   Chloride 108 98 - 111 mmol/L   CO2 24 22 - 32 mmol/L   Glucose, Bld 115 (H) 70 - 99 mg/dL    Comment: Glucose reference range applies only to samples taken after fasting for at least 8 hours.   BUN 22 (H) 6 -  20 mg/dL   Creatinine, Ser 1.08 (H) 0.44 - 1.00 mg/dL   Calcium 9.8 8.9 - 10.3 mg/dL   Total Protein 7.2 6.5 - 8.1 g/dL   Albumin 3.9 3.5 - 5.0 g/dL   AST 26 15 - 41 U/L   ALT 29 0 - 44 U/L   Alkaline Phosphatase 87 38 - 126 U/L   Total Bilirubin 0.5 0.3 - 1.2 mg/dL   GFR, Estimated >60 >60 mL/min    Comment: (NOTE) Calculated using the CKD-EPI Creatinine Equation (2021)    Anion gap 9 5 - 15    Comment: Performed at [facility] [place] [address], [place], [place] [address]  Hemoglobin A1c     Status: Abnormal   Collection Time: 03/12/22  6:23 AM  Result Value Ref Range   Hgb A1c MFr Bld 5.7 (H) 4.8 - 5.6 %    Comment: (NOTE) Pre diabetes:          5.7%-6.4%  Diabetes:              >6.4%  Glycemic control for   <7.0% adults with diabetes    Mean Plasma Glucose  116.89 mg/dL    Comment: Performed at [place] [address], [place], [place] [address]  TSH     Status: Abnormal   Collection Time: 03/12/22  6:23 AM  Result Value Ref Range   TSH 0.129 (L) 0.350 - 4.500 uIU/mL    Comment: Performed by a 3rd Generation assay with a functional sensitivity of <=0.01 uIU/mL. Performed at [facility], [place] [address], [place], [place] [address]   Urinalysis, Complete w Microscopic Urine, Clean Catch     Status: Abnormal   Collection Time: 03/12/22 10:50 AM  Result Value Ref Range   Color, Urine YELLOW YELLOW   APPearance CLEAR CLEAR   Specific Gravity, Urine 1.018 1.005 - 1.030   pH 6.0 5.0 - 8.0   Glucose, UA NEGATIVE NEGATIVE mg/dL   Hgb urine dipstick NEGATIVE NEGATIVE   Bilirubin Urine NEGATIVE NEGATIVE   Ketones, ur NEGATIVE NEGATIVE mg/dL   Protein, ur 30 (A) NEGATIVE mg/dL   Nitrite NEGATIVE NEGATIVE   Leukocytes,Ua SMALL (A) NEGATIVE   RBC / HPF 0-5 0 - 5 RBC/hpf   WBC, UA 0-5 0 - 5 WBC/hpf   Bacteria, UA NONE SEEN NONE SEEN   Squamous Epithelial / LPF 0-5 0 - 5   Mucus PRESENT     Comment: Performed at [facility], [place] [address] [place], [place] [address]  Thyroid antibodies     Status: None   Collection Time: 03/12/22  6:22 PM  Result Value Ref Range   Thyroperoxidase Ab SerPl-aCnc 30 0 - 34 IU/mL   Thyroglobulin Antibody <1.0 0.0 - 0.9 IU/mL    Comment: (NOTE) Thyroglobulin Antibody measured by [facility] Methodology Performed At: [facility] [place], [place] [id number] Rush Farmer MD [id number]   Thyroid stimulating immunoglobulin     Status: None   Collection Time: 03/12/22  6:22 PM  Result Value Ref Range   Thyroid Stimulating Immunoglob <0.10 0.00 - 0.55 IU/L    Comment: (NOTE) Performed At: [facility] [place], [place] [id number] Rush Farmer MD [id number]   Lipid panel     Status: Abnormal   Collection  Time: 03/13/22  6:36 AM  Result Value Ref Range   Cholesterol 218 (H) 0 - 200 mg/dL   Triglycerides 366 (H) <150 mg/dL  HDL 38 (L) >40 mg/dL   Total CHOL/HDL Ratio 5.7 RATIO   VLDL 73 (H) 0 - 40 mg/dL   LDL Cholesterol 107 (H) 0 - 99 mg/dL    Comment:        Total Cholesterol/HDL:CHD Risk Coronary Heart Disease Risk Table                     Men   Women  1/2 Average Risk   3.4   3.3  Average Risk       5.0   4.4  2 X Average Risk   9.6   7.1  3 X Average Risk  23.4   11.0        Use the calculated Patient Ratio above and the CHD Risk Table to determine the patient's CHD Risk.        ATP III CLASSIFICATION (LDL):  <100     mg/dL   Optimal  100-129  mg/dL   Near or Above                    Optimal  130-159  mg/dL   Borderline  160-189  mg/dL   High  >190     mg/dL   Very High Performed at [place] [address], [place], [place] [address]   T4, free     Status: None   Collection Time: 03/13/22  6:36 AM  Result Value Ref Range   Free T4 0.77 0.61 - 1.12 ng/dL    Comment: (NOTE) Biotin ingestion may interfere with free T4 tests. If the results are inconsistent with the TSH level, previous test results, or the clinical presentation, then consider biotin interference. If needed, order repeat testing after stopping biotin. Performed at [facility], [place] [address], [place], [place] [address]   VITAMIN D 25 Hydroxy (Vit-D Deficiency, Fractures)     Status: None   Collection Time: 03/13/22  6:36 AM  Result Value Ref Range   Vit D, 25-Hydroxy 31.97 30 - 100 ng/mL    Comment: (NOTE) Vitamin D deficiency has been defined by the [place] practice guideline as a level of serum 25-OH  vitamin D less than 20 ng/mL (1,2). The Endocrine Society went on to  further define vitamin D insufficiency as a level between 21 and 29  ng/mL (2).  1. IOM (Institute of Medicine). 2010. Dietary reference intakes for  calcium  and D. [place]: The [employer]. 2. Holick MF, Binkley Ahuimanu, Bischoff-Ferrari HA, et al. Evaluation,  treatment, and prevention of vitamin D deficiency: an Endocrine  Society clinical practice guideline, JCEM. 2011 Jul; 96(7): 1911-30.  Performed at [facility], [place] [address] [place], [place] [address]   Vitamin B12     Status: None   Collection Time: 03/13/22  6:36 AM  Result Value Ref Range   Vitamin B-12 667 180 - 914 pg/mL    Comment: (NOTE) This assay is not validated for testing neonatal or myeloproliferative syndrome specimens for Vitamin B12 levels. Performed at [facility], [place] [address], [place],  [address]     Blood Alcohol level:  Lab Results  Component Value Date   [facility] <10 03/10/2022   ETH <10 08/26/7251    Metabolic Disorder Labs: Lab Results  Component Value Date   HGBA1C 5.7 (H) 03/12/2022   MPG 116.89 03/12/2022   MPG 119.76 10/08/2021   No  results found for: "PROLACTIN" Lab Results  Component Value Date   CHOL 218 (H) 03/13/2022   TRIG 366 (H) 03/13/2022   HDL 38 (L) 03/13/2022   CHOLHDL 5.7 03/13/2022   VLDL 73 (H) 03/13/2022   LDLCALC 107 (H) 03/13/2022   LDLCALC 90 10/08/2021    Physical Findings: AIMS: Facial and Oral Movements Muscles of Facial Expression: None, normal Lips and Perioral Area: None, normal Jaw: None, normal Tongue: None, normal,Extremity Movements Upper (arms, wrists, hands, fingers): None, normal Lower (legs, knees, ankles, toes): None, normal, Trunk Movements Neck, shoulders, hips: None, normal, Overall Severity Severity of abnormal movements (highest score from questions above): None, normal Incapacitation due to abnormal movements: None, normal Patient's awareness of abnormal movements (rate only patient's report): No Awareness, Dental Status Current problems with teeth and/or dentures?: No Does patient usually wear dentures?: No  CIWA:    COWS:      Musculoskeletal: Strength & Muscle Tone: within normal limits Gait & Station: normal Patient leans: N/A  Psychiatric Specialty Exam:  Presentation  General Appearance: Disheveled  Eye Contact:Good  Speech:Clear and Coherent  Speech Volume:Normal  Handedness:Right   Mood and Affect  Mood:Depressed  Affect:Congruent   Thought Process  Thought Processes:Coherent  Descriptions of Associations:Intact  Orientation:Full (Time, Place and Person)  Thought Content:Logical  History of Schizophrenia/Schizoaffective disorder:No data recorded Duration of Psychotic Symptoms:No data recorded Hallucinations:Hallucinations: Auditory Description of Auditory Hallucinations: muffled voices coming from air conditioning unit  Ideas of Reference:None  Suicidal Thoughts:Suicidal Thoughts: No SI Passive Intent and/or Plan: Without Intent; Without Plan  Homicidal Thoughts:Homicidal Thoughts: No   Sensorium  Memory:Immediate Good  Judgment:Fair  Insight:Fair   Executive Functions  Concentration:Fair  Attention Span:Fair  [place]   Psychomotor Activity  Psychomotor Activity:Psychomotor Activity: Normal   Assets  Assets:Communication Skills   Sleep  Sleep:Sleep: Poor    Physical Exam: Physical Exam Constitutional:      Appearance: Normal appearance.  HENT:     Head: Normocephalic.     Nose: Nose normal. No congestion or rhinorrhea.  Eyes:     Pupils: Pupils are equal, round, and reactive to light.  Pulmonary:     Effort: Pulmonary effort is normal.  Musculoskeletal:     Cervical back: Normal range of motion.  Neurological:     Mental Status: She is alert and oriented to person, place, and time.  Psychiatric:        Behavior: Behavior normal.    Review of Systems  Constitutional: Negative.   HENT: Negative.    Eyes: Negative.   Respiratory: Negative.    Cardiovascular: Negative.    Gastrointestinal: Negative.   Genitourinary: Negative.   Musculoskeletal: Negative.   Skin: Negative.   Psychiatric/Behavioral:  Positive for depression, hallucinations and substance abuse. Negative for memory loss and suicidal ideas. The patient is nervous/anxious and has insomnia.    Blood pressure 131/70, pulse 78, temperature 97.8 F (36.6 C), temperature source Oral, resp. rate 18, height '5\' 6"'$  (1.676 m), weight 89.2 kg, SpO2 100 %. Body mass index is 31.73 kg/m.  Treatment Plan Summary: Daily contact with patient to assess and evaluate symptoms and progress in treatment and Medication management   Observation Level/Precautions:  15 minute checks  Laboratory:  Labs reviewed   Psychotherapy:  Unit Group sessions  Medications:  See [facility]  Consultations:  To be determined   Discharge Concerns:  Safety, medication compliance, mood stability  Estimated LOS: 5-7 days  Other:  N/A  PLAN Safety and Monitoring: Voluntary admission to inpatient psychiatric unit for safety, stabilization and treatment Daily contact with patient to assess and evaluate symptoms and progress in treatment Patient's case to be discussed in multi-disciplinary team meeting Observation Level : q15 minute checks Vital signs: q12 hours Precautions: Safety   Long Term Goal(s): Improvement in symptoms so as ready for discharge   Short Term Goals: Ability to identify changes in lifestyle to reduce recurrence of condition will improve, Ability to verbalize feelings will improve, Ability to disclose and discuss suicidal ideas, Ability to demonstrate self-control will improve, Ability to identify and develop effective coping behaviors will improve, Compliance with prescribed medications will improve, and Ability to identify triggers associated with substance abuse/mental health issues will improve   Physician Treatment Plan for Secondary Diagnosis:  Principal Problem:   MDD (major depressive disorder), recurrent,  severe, with psychosis ([place]) Active Problems:   GAD (generalized anxiety disorder)   PTSD (post-traumatic stress disorder)   Intermittent explosive disorder   Stimulant use disorder                        Medications MDD (major depressive disorder), recurrent, severe, with psychosis ([place]) -Increase Cymbalta to 30 mg BID (home med) -Discontinue Seroquel 50 mg nightly for mood stabilization per pt's request   Migraines -Continue Topamax 25 mg BID -Start Imitrex 25 mg PRN Q 2 H for migraines   Anxiety -Continue Hydroxyzine 25 mg every 6 hours PRN -Continue Gabapentin 900 mg BID (home  med)   Insomnia -Continue Trazodone 50 mg nightly PRN   HTN -Continue Losartan 50 mg daily for htn   GERD -Continue Protonix 40 mg daily   Other PRNS -Continue Tylenol 650 mg every 6 hours PRN for mild pain -Continue Maalox 30 mg every 4 hrs PRN for indigestion -Continue Milk of Magnesia as needed every 6 hrs for constipation   Discharge Planning: Social work and case management to assist with discharge planning and identification of hospital follow-up needs prior to discharge Estimated LOS: 5-7 days Discharge Concerns: Need to establish a safety plan; Medication compliance and effectiveness Discharge Goals: Return home with outpatient referrals for mental health follow-up including medication management/psychotherapy   Nicholes Rough, NP 03/13/2022, 4:44 PM

## 2022-03-13 NOTE — Group Note (Signed)
Recreation Therapy Group Note   Group Topic:Team Building  Group Date: 03/13/2022 Start Time: 0930 End Time: 1010 Facilitators: Victorino Sparrow, LRT,CTRS Location: [address] Dayroom   Goal Area(s) Addresses:  Patient will effectively work with peer towards shared goal.  Patient will identify skills used to make activity successful.  Patient will identify how skills used during activity can be used to reach post d/c goals.   Group Description: Straw Bridge. In teams of 3-5, patients were given 15 plastic drinking straws and an equal length of masking tape. Using the materials provided, patients were instructed to build a free standing bridge-like structure to suspend an everyday item (ex: puzzle box) off of the floor or table surface. All materials were required to be used by the team in their design. LRT facilitated post-activity discussion reviewing team process. Patients were encouraged to reflect how the skills used in this activity can be generalized to daily life post discharge.    Affect/Mood: N/A   Participation Level: Did not attend    Clinical Observations/Individualized Feedback:     Plan: Continue to engage patient in RT group sessions 2-3x/week.   Victorino Sparrow, LRT, CTRS 03/13/2022 12:21 PM

## 2022-03-13 NOTE — Progress Notes (Signed)
   03/13/22 0800  Psych Admission Type (Psych Patients Only)  Admission Status Voluntary  Psychosocial Assessment  Patient Complaints Anxiety  Eye Contact Fair  Facial Expression Animated;Flat  Affect Anxious;Sad;Flat  Speech Logical/coherent  Interaction Assertive  Motor Activity Other (Comment) (WNL)  Appearance/Hygiene Disheveled  Behavior Characteristics Cooperative;Anxious  Mood Anxious;Sad  Thought Process  Coherency WDL  Content WDL  Delusions None reported or observed  Perception WDL  Hallucination None reported or observed  Judgment Impaired  Confusion None  Danger to Self  Current suicidal ideation? Denies  Agreement Not to Harm Self Yes  Description of Agreement Verbal contract  Danger to Others  Danger to Others None reported or observed

## 2022-03-13 NOTE — Group Note (Signed)
LCSW Group Therapy Note  Group Date: 03/13/2022 Start Time: 1300 End Time: 1400   Type of Therapy and Topic:  Group Therapy - Healthy vs Unhealthy Coping Skills  Participation Level:  Did Not Attend   Description of Group The focus of this group was to determine what unhealthy coping techniques typically are used by group members and what healthy coping techniques would be helpful in coping with various problems. Patients were guided in becoming aware of the differences between healthy and unhealthy coping techniques. Patients were asked to identify 2-3 healthy coping skills they would like to learn to use more effectively.  Therapeutic Goals Patients learned that coping is what human beings do all day long to deal with various situations in their lives Patients defined and discussed healthy vs unhealthy coping techniques Patients identified their preferred coping techniques and identified whether these were healthy or unhealthy Patients determined 2-3 healthy coping skills they would like to become more familiar with and use more often. Patients provided support and ideas to each other   Summary of Patient Progress:    Patient did not attend group despite encouraged participation.    Therapeutic Modalities Cognitive Behavioral Therapy Motivational Interviewing  Larose Kells 03/13/2022  2:40 PM

## 2022-03-13 NOTE — BH IP Treatment Plan (Signed)
Interdisciplinary Treatment and Diagnostic Plan Update  03/13/2022 Time of Session: 10:40am Laporchia Nakajima MRN: [id number]  Principal Diagnosis: MDD (major depressive disorder), recurrent, severe, with psychosis ([place])  Secondary Diagnoses: Principal Problem:   MDD (major depressive disorder), recurrent, severe, with psychosis ([place]) Active Problems:   GAD (generalized anxiety disorder)   PTSD (post-traumatic stress disorder)   Intermittent explosive disorder   Stimulant use disorder   Current Medications:  Current Facility-Administered Medications  Medication Dose Route Frequency Provider Last Rate Last Admin   acetaminophen (TYLENOL) tablet 650 mg  650 mg Oral Q6H PRN Nicholes Rough, NP   650 mg at 03/13/22 1321   albuterol (VENTOLIN HFA) 108 (90 Base) MCG/ACT inhaler 1-2 puff  1-2 puff Inhalation Q4H PRN Massengill, Ovid Curd, MD       alum & mag hydroxide-simeth (MAALOX/MYLANTA) 200-200-20 MG/5ML suspension 30 mL  30 mL Oral Q4H PRN Ntuen, Kris Hartmann, FNP       DULoxetine (CYMBALTA) DR capsule 20 mg  20 mg Oral BID Onuoha, Chinwendu V, NP   20 mg at 03/13/22 0806   gabapentin (NEURONTIN) capsule 900 mg  900 mg Oral BID Massengill, Ovid Curd, MD   900 mg at 03/13/22 0805   hydrOXYzine (ATARAX) tablet 25 mg  25 mg Oral TID PRN Laretta Bolster, FNP   25 mg at 03/13/22 1303   losartan (COZAAR) tablet 50 mg  50 mg Oral Daily Onuoha, Chinwendu V, NP   50 mg at 03/13/22 0806   magnesium hydroxide (MILK OF MAGNESIA) suspension 30 mL  30 mL Oral Daily PRN Ntuen, Tina C, FNP   30 mL at 03/12/22 1151   metFORMIN (GLUCOPHAGE-XR) 24 hr tablet 500 mg  500 mg Oral Q breakfast Massengill, Nathan, MD   500 mg at 03/13/22 0806   pantoprazole (PROTONIX) EC tablet 40 mg  40 mg Oral Daily Massengill, Ovid Curd, MD   40 mg at 03/13/22 0806   QUEtiapine (SEROQUEL) tablet 50 mg  50 mg Oral QHS Massengill, Ovid Curd, MD   50 mg at 03/12/22 2054   SUMAtriptan (IMITREX) tablet 25 mg  25 mg Oral Once Onuoha, Chinwendu V, NP        tiZANidine (ZANAFLEX) tablet 2 mg  2 mg Oral Q8H PRN Onuoha, Chinwendu V, NP   2 mg at 03/13/22 1304   topiramate (TOPAMAX) tablet 25 mg  25 mg Oral BID Massengill, Ovid Curd, MD   25 mg at 03/13/22 0806   traZODone (DESYREL) tablet 50 mg  50 mg Oral QHS PRN Laretta Bolster, FNP   50 mg at 03/11/22 2208   PTA Medications: Medications Prior to Admission  Medication Sig Dispense Refill Last Dose   acetaminophen (TYLENOL) 650 MG CR tablet Take 650 mg by mouth every 8 (eight) hours as needed for pain or fever.      albuterol (VENTOLIN HFA) 108 (90 Base) MCG/ACT inhaler INHALE 2 PUFFS BY MOUTH EVERY 6 HOURS AS NEEDED FOR COUGHING, WHEEZING, OR SHORTNESS OF BREATH (Patient taking differently: Inhale 2 puffs into the lungs every 6 (six) hours as needed for wheezing or shortness of breath.) 20.1 g 0    Calcium Carbonate-Vitamin D (CALTRATE 600+D PO) Take by mouth.      cetirizine (ZYRTEC) 10 MG tablet Take 10 mg by mouth daily.      Cholecalciferol (VITAMIN D-3 PO) Take 1 tablet by mouth daily.      CYMBALTA 20 MG capsule Take 20 mg by mouth 2 (two) times daily.      DULoxetine (  CYMBALTA) 30 MG capsule Take 1 capsule (30 mg total) by mouth daily. (Patient not taking: Reported on 03/10/2022) 30 capsule 0    Erenumab-aooe (AIMOVIG, 140 MG DOSE, ) One a month      gabapentin (NEURONTIN) 300 MG capsule Take 2 capsules (600 mg total) by mouth 2 (two) times daily. (Patient taking differently: Take 900 mg by mouth 2 (two) times daily.) 120 capsule 0    losartan (COZAAR) 50 MG tablet TAKE 1 Tablet BY MOUTH ONCE EVERY DAY (Patient taking differently: Take 50 mg by mouth daily.) 90 tablet 0    Multiple Vitamins-Minerals (CENTRUM SILVER PO) Take 1 tablet by mouth daily.      omeprazole (PRILOSEC) 40 MG capsule Take 40 mg by mouth daily.      rizatriptan (MAXALT) 10 MG tablet Take 10 mg by mouth as needed for migraine.      SUMAtriptan (IMITREX) 25 MG tablet Take 25 mg by mouth every 2 (two) hours as needed for  migraine. (Patient not taking: Reported on 03/10/2022)      tiZANidine (ZANAFLEX) 2 MG tablet Take 2 mg by mouth 2 (two) times daily.      traMADol (ULTRAM) 50 MG tablet TAKE 1 TABLET(50 MG) BY MOUTH EVERY 12 HOURS AS NEEDED (Patient not taking: Reported on 03/10/2022) 30 tablet 0     Patient Stressors:    Patient Strengths:    Treatment Modalities: Medication Management, Group therapy, Case management,  1 to 1 session with clinician, Psychoeducation, Recreational therapy.   Physician Treatment Plan for Primary Diagnosis: MDD (major depressive disorder), recurrent, severe, with psychosis ([place]) Long Term Goal(s): Improvement in symptoms so as ready for discharge   Short Term Goals: Ability to identify changes in lifestyle to reduce recurrence of condition will improve Ability to verbalize feelings will improve Ability to disclose and discuss suicidal ideas Ability to demonstrate self-control will improve Ability to identify and develop effective coping behaviors will improve Compliance with prescribed medications will improve Ability to identify triggers associated with substance abuse/mental health issues will improve  Medication Management: Evaluate patient's response, side effects, and tolerance of medication regimen.  Therapeutic Interventions: 1 to 1 sessions, Unit Group sessions and Medication administration.  Evaluation of Outcomes: Progressing  Physician Treatment Plan for Secondary Diagnosis: Principal Problem:   MDD (major depressive disorder), recurrent, severe, with psychosis ([place]) Active Problems:   GAD (generalized anxiety disorder)   PTSD (post-traumatic stress disorder)   Intermittent explosive disorder   Stimulant use disorder  Long Term Goal(s): Improvement in symptoms so as ready for discharge   Short Term Goals: Ability to identify changes in lifestyle to reduce recurrence of condition will improve Ability to verbalize feelings will improve Ability to  disclose and discuss suicidal ideas Ability to demonstrate self-control will improve Ability to identify and develop effective coping behaviors will improve Compliance with prescribed medications will improve Ability to identify triggers associated with substance abuse/mental health issues will improve     Medication Management: Evaluate patient's response, side effects, and tolerance of medication regimen.  Therapeutic Interventions: 1 to 1 sessions, Unit Group sessions and Medication administration.  Evaluation of Outcomes: Progressing   RN Treatment Plan for Primary Diagnosis: MDD (major depressive disorder), recurrent, severe, with psychosis ([place]) Long Term Goal(s): Knowledge of disease and therapeutic regimen to maintain health will improve  Short Term Goals: Ability to remain free from injury will improve, Ability to verbalize frustration and anger appropriately will improve, Ability to demonstrate self-control, Ability to participate in decision  making will improve, Ability to verbalize feelings will improve, Ability to disclose and discuss suicidal ideas, Ability to identify and develop effective coping behaviors will improve, and Compliance with prescribed medications will improve  Medication Management: RN will administer medications as ordered by provider, will assess and evaluate patient's response and provide education to patient for prescribed medication. RN will report any adverse and/or side effects to prescribing provider.  Therapeutic Interventions: 1 on 1 counseling sessions, Psychoeducation, Medication administration, Evaluate responses to treatment, Monitor vital signs and CBGs as ordered, Perform/monitor CIWA, COWS, AIMS and Fall Risk screenings as ordered, Perform wound care treatments as ordered.  Evaluation of Outcomes: Progressing   LCSW Treatment Plan for Primary Diagnosis: MDD (major depressive disorder), recurrent, severe, with psychosis ([place]) Long Term Goal(s):  Safe transition to appropriate next level of care at discharge, Engage patient in therapeutic group addressing interpersonal concerns.  Short Term Goals: Engage patient in aftercare planning with referrals and resources, Increase social support, Increase ability to appropriately verbalize feelings, Increase emotional regulation, Facilitate acceptance of mental health diagnosis and concerns, Facilitate patient progression through stages of change regarding substance use diagnoses and concerns, Identify triggers associated with mental health/substance abuse issues, and Increase skills for wellness and recovery  Therapeutic Interventions: Assess for all discharge needs, 1 to 1 time with Social worker, Explore available resources and support systems, Assess for adequacy in community support network, Educate family and significant other(s) on suicide prevention, Complete Psychosocial Assessment, Interpersonal group therapy.  Evaluation of Outcomes: Progressing   Progress in Treatment: Attending groups: Yes. Participating in groups: Yes. Taking medication as prescribed: Yes. Toleration medication: Yes. Family/Significant other contact made: Yes, individual(s) contacted:  Samantha Crimes, mother Patient understands diagnosis: Yes. Discussing patient identified problems/goals with staff: Yes. Medical problems stabilized or resolved: Yes Denies suicidal/homicidal ideation: Yes. Issues/concerns per patient self-inventory: No.  New problem(s) identified: No, Describe:  none reported   New Short Term/Long Term Goal(s):  medication stabilization, elimination of SI thoughts, development of comprehensive mental wellness plan.    Patient Goals:  Patient states, "I want to get on something other than seroquel and improve sleep"  Discharge Plan or Barriers:  Patient recently admitted. CSW will continue to follow and assess for appropriate referrals and possible discharge planning.    Reason for Continuation  of Hospitalization: Anxiety Depression Medication stabilization Suicidal ideation  Estimated Length of Stay: 3-7 days  Last 3 [place] Suicide Severity Risk Score: [place] Admission (Current) from 03/11/2022 in [facility] ED from 03/10/2022 in [facility] ED from 01/16/2022 in [place] Low Risk High Risk High Risk       Last [facility] 2/9 Scores:    09/14/2020    1:59 PM  Depression screen PHQ 2/9  Decreased Interest 2  Down, Depressed, Hopeless 2  PHQ - 2 Score 4  Altered sleeping 2  Tired, decreased energy 2  Change in appetite 2  Feeling bad or failure about yourself  2  Trouble concentrating 2  PHQ-9 Score 14  Difficult doing work/chores Very difficult    Scribe for Treatment Team: Zachery Conch, LCSW 03/13/2022 1:48 PM

## 2022-03-13 NOTE — Progress Notes (Signed)

## 2022-03-13 NOTE — Plan of Care (Signed)

## 2022-03-14 LAB — T3, FREE: T3, Free: 3.2 pg/mL (ref 2.0–4.4)

## 2022-03-14 MED ORDER — OLANZAPINE 5 MG PO TBDP: 5.0000 mg | ORAL_TABLET | Freq: Once | ORAL | Status: AC | PRN

## 2022-03-14 MED ORDER — LORAZEPAM 1 MG PO TABS: 1.0000 mg | ORAL_TABLET | ORAL | Status: AC | PRN

## 2022-03-14 MED ORDER — TRAZODONE HCL 50 MG PO TABS
50.0000 mg | ORAL_TABLET | Freq: Every day | ORAL | Status: DC
  Administered 2022-03-14 – 2022-03-15 (×2): 50 mg via ORAL
  Filled 2022-03-14: qty 7
  Filled 2022-03-14 (×4): qty 1

## 2022-03-14 NOTE — Progress Notes (Signed)

## 2022-03-14 NOTE — Progress Notes (Signed)
.  .  .        xx

## 2022-03-14 NOTE — Progress Notes (Signed)
[facility] MD Progress Note  03/14/2022 3:55 PM Briana Wyatt  MRN:  [id number]  HPI:  This is a 51 year old female with a reported past psychiatric history of bipolar disorder, PTSD, GAD, panic disorder, who was admitted to the psychiatric unit for worsening depression and suicidal thoughts, after suicide attempt by overdose  2 times within the week prior to the admission.  24 hour chart review: Vital signs over the past 24 hrs have been mostly WNL with a slightly elevated HR earlier today morning at 102. As per nursing documentation, she has not attended most unit group sessions, and has been appropriate and attentive, but anxious. Pt has been compliant with her scheduled medications for the past 24 hrs, and required Trazodone 50 mg last night for insomnia. She required Atarax 25 mg twice yesterday, and once today for anxiety.  Today's patient assessment: Pt presents today with a euthymic mood, but reported being tired earlier in the morning.  Attention to personal hygiene and grooming is fair today, eye contact is good, speech is clear & coherent. Thought contents are organized and logical, and pt currently denies SI/HI, and denies AVH. She denies paranoia, and there is no evidence of delusional thoughts.    Pt reports that her sleep quality last night was poor, and is requesting for her Trazodone to be moved to 8 pm as she had trouble falling asleep as she states medication was given late. Will move this medication to 8 pm nightly. She reports a good appetite, denies being in any physical pain today, and states that Imitrex is effective in management of her migraines. We are continuing her Cymbalta 30 mg BID for her depressive symptoms and pain, as well as other medications as listed below.  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   Hypertension    Restless leg syndrome    Sciatica of right side     Past Surgical History:  Procedure Laterality Date   CARPAL TUNNEL RELEASE Bilateral    ECTOPIC PREGNANCY SURGERY     SHOULDER SURGERY Right    TUBAL LIGATION     Family History:  Family History  Problem Relation Age of Onset   Hypertension Father    Stroke Father    Family Psychiatric  History: none reported Social History:  Social History   Substance and Sexual Activity  Alcohol Use No     Social History   Substance and Sexual Activity  Drug Use Yes   Types: "Crack" cocaine   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    Social History   Socioeconomic History   Marital status: Single    Spouse name: Not on file   Number of children: 3   Years of education: Not on file   Highest education level: GED or equivalent  Occupational History   Not on file  Tobacco Use   Smoking status: Former    Types: Cigarettes    Quit date: 08/25/1995  Years since quitting: 26.5    Passive exposure: Past   Smokeless tobacco: Never  Vaping Use   Vaping Use: Never used  Substance and Sexual Activity   Alcohol use: No   Drug use: Yes    Types: "Crack" cocaine    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   Sexual activity: Not Currently    Birth control/protection: Surgical  Other Topics Concern   Not on file  Social History Narrative   Not on file   Social Determinants of Health   Financial Resource Strain: Not on file  Food Insecurity: No Food Insecurity (11/06/2021)   Hunger  Vital Sign    Worried About Running Out of Food in the Last Year: Never true    Ran Out of Food in the Last Year: Never true  Transportation Needs: No Transportation Needs (11/06/2021)   PRAPARE - Hydrologist (Medical): No    Lack of Transportation (Non-Medical): No  Physical Activity: Not on file  Stress: Not on file  Social Connections: Not on file   Additional Social History:   Sleep: Poor  Appetite:  Fair  Current Medications: Current Facility-Administered Medications  Medication Dose Route Frequency Provider Last Rate Last Admin   acetaminophen (TYLENOL) tablet 650 mg  650 mg Oral Q6H PRN Nicholes Rough, NP   650 mg at 03/13/22 1321   albuterol (VENTOLIN HFA) 108 (90 Base) MCG/ACT inhaler 1-2 puff  1-2 puff Inhalation Q4H PRN Massengill, Ovid Curd, MD       alum & mag hydroxide-simeth (MAALOX/MYLANTA) 200-200-20 MG/5ML suspension 30 mL  30 mL Oral Q4H PRN Ntuen, Kris Hartmann, FNP       DULoxetine (CYMBALTA) DR capsule 30 mg  30 mg Oral BID Massengill, Ovid Curd, MD   30 mg at 03/14/22 0737   gabapentin (NEURONTIN) capsule 900 mg  900 mg Oral BID Massengill, Ovid Curd, MD   900 mg at 03/14/22 0737   hydrOXYzine (ATARAX) tablet 25 mg  25 mg Oral TID PRN Laretta Bolster, FNP   25 mg at 03/14/22 1215   losartan (COZAAR) tablet 50 mg  50 mg Oral Daily Onuoha, Chinwendu V, NP   50 mg at 03/14/22 0737   magnesium hydroxide (MILK OF MAGNESIA) suspension 30 mL  30 mL Oral Daily PRN Ntuen, Tina C, FNP   30 mL at 03/12/22 1151   pantoprazole (PROTONIX) EC tablet 40 mg  40 mg Oral Daily Massengill, Ovid Curd, MD   40 mg at 03/14/22 0737   SUMAtriptan (IMITREX) tablet 25 mg  25 mg Oral Once Onuoha, Chinwendu V, NP       SUMAtriptan (IMITREX) tablet 25 mg  25 mg Oral Q2H PRN Massengill, Ovid Curd, MD   25 mg at 03/14/22 1215   tiZANidine (ZANAFLEX) tablet 2 mg  2 mg Oral Q8H PRN Onuoha, Chinwendu V, NP   2 mg at 03/14/22 0737   topiramate (TOPAMAX) tablet 25 mg  25 mg Oral BID Massengill,  Ovid Curd, MD   25 mg at 03/14/22 0737   traZODone (DESYREL) tablet 50 mg  50 mg Oral QHS Nicholes Rough, NP        Lab Results:  Results for orders placed or performed during the hospital encounter of 03/11/22 (from the past 48 hour(s))  Thyroid antibodies     Status: None   Collection Time: 03/12/22  6:22 PM  Result Value Ref Range   Thyroperoxidase Ab SerPl-aCnc 30 0 - 34 IU/mL  Thyroglobulin Antibody <1.0 0.0 - 0.9 IU/mL    Comment: (NOTE) Thyroglobulin Antibody measured by USAA Methodology Performed At: [facility] [place], [place] [id number] Rush Farmer MD [id number]   Thyroid stimulating immunoglobulin     Status: None   Collection Time: 03/12/22  6:22 PM  Result Value Ref Range   Thyroid Stimulating Immunoglob <0.10 0.00 - 0.55 IU/L    Comment: (NOTE) Performed At: [facility] [place], [place] [id number] Rush Farmer MD [id number]   Lipid panel     Status: Abnormal   Collection Time: 03/13/22  6:36 AM  Result Value Ref Range   Cholesterol 218 (H) 0 - 200 mg/dL   Triglycerides 366 (H) <150 mg/dL   HDL 38 (L) >40 mg/dL   Total CHOL/HDL Ratio 5.7 RATIO   VLDL 73 (H) 0 - 40 mg/dL   LDL Cholesterol 107 (H) 0 - 99 mg/dL    Comment:        Total Cholesterol/HDL:CHD Risk Coronary Heart Disease Risk Table                     Men   Women  1/2 Average Risk   3.4   3.3  Average Risk       5.0   4.4  2 X Average Risk   9.6   7.1  3 X Average Risk  23.4   11.0        Use the calculated Patient Ratio above and the CHD Risk Table to determine the patient's CHD Risk.        ATP III CLASSIFICATION (LDL):  <100     mg/dL   Optimal  100-129  mg/dL   Near or Above                    Optimal  130-159  mg/dL   Borderline  160-189  mg/dL   High  >190     mg/dL   Very High Performed at [facility] [address], [place], [place] [address]   T4, free     Status: None   Collection Time:  03/13/22  6:36 AM  Result Value Ref Range   Free T4 0.77 0.61 - 1.12 ng/dL    Comment: (NOTE) Biotin ingestion may interfere with free T4 tests. If the results are inconsistent with the TSH level, previous test results, or the clinical presentation, then consider biotin interference. If needed, order repeat testing after stopping biotin. Performed at [facility], [place] [address], [place], [place] [address]   T3, free     Status: None   Collection Time: 03/13/22  6:36 AM  Result Value Ref Range   T3, Free 3.2 2.0 - 4.4 pg/mL    Comment: (NOTE) Performed At: [facility] [place], [place] [id number] Rush Farmer MD [id number]   VITAMIN D 25 Hydroxy (Vit-D Deficiency, Fractures)     Status: None   Collection Time: 03/13/22  6:36 AM  Result Value Ref Range   Vit D, 25-Hydroxy 31.97 30 - 100 ng/mL    Comment: (NOTE) Vitamin D deficiency has been defined by the [place] practice guideline as a level of serum 25-OH  vitamin D less than 20 ng/mL (1,2). The Endocrine Society went on to  further define vitamin D insufficiency as a level between 21 and 29  ng/mL (2).  1. IOM (Institute of  Medicine). 2010. Dietary reference intakes for  calcium and D. [place]: The [employer]. 2. Holick MF, Binkley [place], Bischoff-Ferrari HA, et al. Evaluation,  treatment, and prevention of vitamin D deficiency: an Endocrine  Society clinical practice guideline, JCEM. 2011 Jul; 96(7): 1911-30.  Performed at [facility], [place] [address], [place], [place] [address]   Vitamin B12     Status: None   Collection Time: 03/13/22  6:36 AM  Result Value Ref Range   Vitamin B-12 667 180 - 914 pg/mL    Comment: (NOTE) This assay is not validated for testing neonatal or myeloproliferative syndrome specimens for Vitamin B12 levels. Performed at [facility], [place] [address], [place], [place]  [address]     Blood Alcohol level:  Lab Results  Component Value Date   ETH <10 03/10/2022   ETH <10 72/53/6644    Metabolic Disorder Labs: Lab Results  Component Value Date   HGBA1C 5.7 (H) 03/12/2022   MPG 116.89 03/12/2022   MPG 119.76 10/08/2021   No results found for: "PROLACTIN" Lab Results  Component Value Date   CHOL 218 (H) 03/13/2022   TRIG 366 (H) 03/13/2022   HDL 38 (L) 03/13/2022   CHOLHDL 5.7 03/13/2022   VLDL 73 (H) 03/13/2022   LDLCALC 107 (H) 03/13/2022   LDLCALC 90 10/08/2021    Physical Findings: AIMS: Facial and Oral Movements Muscles of Facial Expression: None, normal Lips and Perioral Area: None, normal Jaw: None, normal Tongue: None, normal,Extremity Movements Upper (arms, wrists, hands, fingers): None, normal Lower (legs, knees, ankles, toes): None, normal, Trunk Movements Neck, shoulders, hips: None, normal, Overall Severity Severity of abnormal movements (highest score from questions above): None, normal Incapacitation due to abnormal movements: None, normal Patient's awareness of abnormal movements (rate only patient's report): No Awareness, Dental Status Current problems with teeth and/or dentures?: No Does patient usually wear dentures?: No  CIWA:    COWS:     Musculoskeletal: Strength & Muscle Tone: within normal limits Gait & Station: normal Patient leans: N/A  Psychiatric Specialty Exam:  Presentation  General Appearance: Appropriate for Environment; Fairly Groomed  Eye Contact:Good  Speech:Clear and Coherent  Speech Volume:Normal  Handedness:Right   Mood and Affect  Mood:Euthymic  Affect:Congruent; Appropriate   Thought Process  Thought Processes:Coherent  Descriptions of Associations:Intact  Orientation:Full (Time, Place and Person)  Thought Content:Logical  History of Schizophrenia/Schizoaffective disorder:No data recorded Duration of Psychotic Symptoms:No data recorded Hallucinations:Hallucinations:  None Description of Auditory Hallucinations: muffled voices coming from air conditioning unit  Ideas of Reference:None  Suicidal Thoughts:Suicidal Thoughts: No  Homicidal Thoughts:Homicidal Thoughts: No   Sensorium  Memory:Immediate Good  Judgment:Fair  Insight:Fair   Executive Functions  Concentration:Fair  Attention Span:Fair  [place]   Psychomotor Activity  Psychomotor Activity:Psychomotor Activity: Normal   Assets  Assets:Communication Skills   Sleep  Sleep:Sleep: Fair    Physical Exam: Physical Exam Constitutional:      Appearance: Normal appearance.  HENT:     Head: Normocephalic.     Nose: Nose normal. No congestion or rhinorrhea.  Eyes:     Pupils: Pupils are equal, round, and reactive to light.  Pulmonary:     Effort: Pulmonary effort is normal.  Musculoskeletal:     Cervical back: Normal range of motion.  Neurological:     Mental Status: She is alert and oriented to person, place, and time.  Psychiatric:        Behavior: Behavior  normal.    Review of Systems  Constitutional: Negative.   HENT: Negative.    Eyes: Negative.   Respiratory: Negative.    Cardiovascular: Negative.   Gastrointestinal: Negative.   Genitourinary: Negative.   Musculoskeletal: Negative.   Skin: Negative.   Psychiatric/Behavioral:  Positive for depression, hallucinations and substance abuse. Negative for memory loss and suicidal ideas. The patient is nervous/anxious and has insomnia.    Blood pressure 99/76, pulse (!) 102, temperature 97.7 F (36.5 C), temperature source Oral, resp. rate 18, height '5\' 6"'$  (1.676 m), weight 89.2 kg, SpO2 100 %. Body mass index is 31.73 kg/m.  Treatment Plan Summary: Daily contact with patient to assess and evaluate symptoms and progress in treatment and Medication management   Observation Level/Precautions:  15 minute checks  Laboratory:  Labs reviewed   Psychotherapy:  Unit Group  sessions  Medications:  See [facility]  Consultations:  To be determined   Discharge Concerns:  Safety, medication compliance, mood stability  Estimated LOS: 5-7 days  Other:  N/A    PLAN Safety and Monitoring: Voluntary admission to inpatient psychiatric unit for safety, stabilization and treatment Daily contact with patient to assess and evaluate symptoms and progress in treatment Patient's case to be discussed in multi-disciplinary team meeting Observation Level : q15 minute checks Vital signs: q12 hours Precautions: Safety   Long Term Goal(s): Improvement in symptoms so as ready for discharge   Short Term Goals: Ability to identify changes in lifestyle to reduce recurrence of condition will improve, Ability to verbalize feelings will improve, Ability to disclose and discuss suicidal ideas, Ability to demonstrate self-control will improve, Ability to identify and develop effective coping behaviors will improve, Compliance with prescribed medications will improve, and Ability to identify triggers associated with substance abuse/mental health issues will improve   Physician Treatment Plan for Secondary Diagnosis:  Principal Problem:   MDD (major depressive disorder), recurrent, severe, with psychosis ([place]) Active Problems:   GAD (generalized anxiety disorder)   PTSD (post-traumatic stress disorder)   Intermittent explosive disorder   Stimulant use disorder                        Medications MDD (major depressive disorder), recurrent, severe, with psychosis ([place]) -Continue Cymbalta to 30 mg BID (home med) -Discontinued Seroquel 50 mg nightly for mood stabilization per pt's request   Migraines -Continue Topamax 25 mg BID -Continue Imitrex 25 mg PRN Q 2 H for migraines   Anxiety -Continue Hydroxyzine 25 mg every 6 hours PRN -Continue Gabapentin 900 mg BID (home  med)   Insomnia -Continue Trazodone 50 mg nightly and schedule to 8 pm nightly   HTN -Continue Losartan 50 mg daily for  htn   GERD -Continue Protonix 40 mg daily  Labs Reviewed: EKG WNL, pt with a reported history of hyperthyroidism, states that she has had some type of thyroid surgery in the past, TSH 0.129, Free T3, Free T4 WNL. Triglycerides 366, pt educated on the need for healthy food choices and exercise, and will need PCP f/u. UA with moderate leukocytes initially, and repeated with small leukocytes. Pt asymptomatic and treatment not necessary at this time.  Other PRNS -Continue Tylenol 650 mg every 6 hours PRN for mild pain -Continue Maalox 30 mg every 4 hrs PRN for indigestion -Continue Milk of Magnesia as needed every 6 hrs for constipation   Discharge Planning: Social work and case management to assist with discharge planning and identification of hospital follow-up needs  prior to discharge Estimated LOS: 5-7 days Discharge Concerns: Need to establish a safety plan; Medication compliance and effectiveness Discharge Goals: Return home with outpatient referrals for mental health follow-up including medication management/psychotherapy   Nicholes Rough, NP 03/14/2022, 3:55 PMPatient ID: Briana Wyatt, female   DOB: [birth year], 51 y.o.   MRN: [id number]

## 2022-03-14 NOTE — Progress Notes (Addendum)
D. Pt presents as anxious, fidgety, and hyper-verbal, but has been friendly during interactions. Pt has been visible in the milieu throughout the shift, but did not attend groups earlier in the shift due to complaints of migraine - Later in the shift, pt expressed agitation indirectly towards some younger peer following an accusation made about her. Earlier, a younger female peer had approached this nurse with complaints that the pt was flirting with him. The younger female peer was moved to another hall which appeared to be agreeable to both patients.  Pt currently denies SI/HI and AVH and agrees to contact staff before acting on any harmful thoughts.  A. Labs and vitals monitored. Pt given and educated on medications. Pt supported emotionally and encouraged to express concerns and ask questions.   R. Pt remains safe with 15 minute checks. Will continue POC.

## 2022-03-14 NOTE — [facility] Group Notes (Cosign Needed Addendum)
Date:  03/14/2022    Time:  10:00 - 11:00am   Type of Therapy:  Group Therapy   Topic:  Rejection - How it impacts our daily lives and how to handle it.   Participation Level:  Did Not Attend   Participation Quality:  Did Not Attend   Affect:  Did Not Attend   Cognitive:  Did Not Attend   Insight:  Did Not Attend   Engagement in Group:  Did Not Attend   Modes of Intervention:   Mindfulness; Cognitive Behavioral Therapy; Music Activity   Summary of Progress/Problems:  Pt did not attend.   Adalberto Ill, PMHNP Student 03/14/2022, 1:30 PM   Cosigned by Selmer Dominion, LCSW 03/14/2022, 4:19 PM

## 2022-03-15 MED ORDER — PHENYLEPHRINE-MINERAL OIL-PET 0.25-14-74.9 % RE OINT
1.0000 | TOPICAL_OINTMENT | Freq: Two times a day (BID) | RECTAL | Status: DC | PRN
  Administered 2022-03-15: 1 via RECTAL
  Filled 2022-03-15: qty 57

## 2022-03-15 NOTE — Progress Notes (Signed)
   03/15/22 2121  Psych Admission Type (Psych Patients Only)  Admission Status Voluntary  Psychosocial Assessment  Patient Complaints Anxiety;Agitation  Eye Contact Fair  Facial Expression Animated  Affect Appropriate to circumstance  Speech Logical/coherent  Interaction Assertive;Intrusive  Motor Activity Other (Comment) (WDL)  Appearance/Hygiene Unremarkable  Behavior Characteristics Appropriate to situation  Mood Anxious;Labile  Thought Process  Coherency WDL  Content WDL  Delusions None reported or observed  Perception WDL  Hallucination None reported or observed  Judgment Impaired  Confusion None  Danger to Self  Current suicidal ideation? Denies  Agreement Not to Harm Self Yes  Description of Agreement verbal  Danger to Others  Danger to Others None reported or observed

## 2022-03-15 NOTE — Progress Notes (Addendum)
D. Pt presented with a sad affect, appeared tired upon initial approach. Pt continues to complain of 'mold' in her shower, expressing concern that the mold is what's causing her migraines. Pt declined to move out of her room when offered, stating that "it really won't matter if I move." Per pt's self inventory, pt rated her depression, hopelessness and anxiety a 7/1/4, respectively. Pt currently denies SI/HI and AVH  A. Labs and vitals monitored. Pt given and educated on medications. Pt given prn Imitrex for headache rated 8/10.  Pt supported emotionally and encouraged to express concerns  R. Pt remains safe with 15 minute checks. Will continue POC.

## 2022-03-15 NOTE — Progress Notes (Signed)
   03/15/22 0038  Psych Admission Type (Psych Patients Only)  Admission Status Voluntary  Psychosocial Assessment  Patient Complaints Anxiety  Eye Contact Fair  Facial Expression Animated  Affect Appropriate to circumstance  Speech Logical/coherent  Interaction Assertive  Motor Activity Other (Comment) (WDL)  Appearance/Hygiene Disheveled  Behavior Characteristics Appropriate to situation  Mood Anxious;Labile  Thought Process  Coherency WDL  Content WDL  Delusions None reported or observed  Perception WDL  Hallucination None reported or observed  Judgment Impaired  Confusion None  Danger to Self  Current suicidal ideation? Denies  Agreement Not to Harm Self Yes  Description of Agreement verbal  Danger to Others  Danger to Others None reported or observed

## 2022-03-15 NOTE — Progress Notes (Signed)
D.  Pt upset at beginning of shift stating that there is mold in her bathroom.  Environmental staff and charge nurse went to Pt's room and evaluated this, and at this time charge nurse offered to allow Pt to move to another room.  Pt stated that that room also had mold in it and standing water and stated that she might as well stay in her present room.  Pt cursing and throwing things in her room.  Pt came out and engaged in loud angry phone call then exclaimed to this writer than she will be discharging on Monday and will at that time file a compliant.    A.  Writer again offered to move Pt, support and encouragement offered.  Writer spoke with NP to get medication to help Pt calm down.  Pt denied need and was able to calm herself.    R.  Pt presently in room resting with eyes closed, will continue to monitor.

## 2022-03-15 NOTE — Group Note (Signed)
[facility] LCSW Group Therapy Note  Date/Time:  03/15/2022    Type of Therapy and Topic:  Group Therapy: Music and Mood  Participation Level:  Did Not Attend   Description of Group: In this process group, members listened to a variety of music through choosing from CSW's list #1 through #25.  Patients identified the messages received from those songs and how the music affected their emotions.  Patients were encouraged to use music as a coping skill at home, but to be mindful of the choices made.  Patients discussed how this knowledge can help with wellness and recovery in various ways including managing depression and anxiety as well as encouraging healthy sleep habits.    Therapeutic Goals: Patients will explore the impact of different songs on mood Patients will verbalize the thoughts they have when listening to different types of music Patients will identify music that is soothing to them as well as music that is energizing to them Patients will discuss how to use this knowledge to assist in maintaining wellness and recovery Patients will explore the use of music as a coping skill Patients will encourage one another  Summary of Patient Progress:  Patient was invited to group, did not attend.  Therapeutic Modalities: Solution Focused Brief Therapy Activity   Selmer Dominion, LCSW

## 2022-03-15 NOTE — Progress Notes (Addendum)
[facility] MD Progress Note  03/15/2022 1:15 PM Briana Wyatt  MRN:  [id number]  HPI:  This is a 51 year old female with a reported past psychiatric history of bipolar disorder, PTSD, GAD, panic disorder, who was admitted to the psychiatric unit for worsening depression and suicidal thoughts, after suicide attempt by overdose  2 times within the week prior to the admission.  24 hour chart review: Vital signs over the past 24 hrs have been mostly WNL. As per nursing documentation, she is continuing not attending most unit group sessions. As per nursing reports, pt was accused by a younger female peer of flirtatious behaviors, leading to a hall reassignment for the female peer. Pt is noted with the past 24 hrs to be anxious, assertive but animated at times. Pt has been compliant with her scheduled medications for the past 24 hrs, and required Trazodone 50 mg last night for insomnia. She required Atarax 25 mg twice yesterday, and once today for anxiety. She required Tylenol and imitrex earlier today for pain and migraines respectively.  Today's patient assessment: Pt presents today with a euthymic mood, but continues to reports of morning tiredness. She lay in bed earlier this morning, stated that she was not going to group sessions due to a migraine. She was however, visible in the hallways shortly after that. She is somatic, and comes to nursing staff often with complaints. She told Probation officer that there is mold in her room and stated that it is the reason that she has been having migraines. Writer reminded her that she complained of a migraine on initial assessment. Writer however looked in pt's bathroom to where she pointed and there was no mold there.  Attention to personal hygiene and grooming is fair, eye contact is good, speech is clear & coherent. Thought contents are organized and logical, and pt currently denies SI/HI, and denies AVH. She denies paranoia, and there is no evidence of delusional thoughts.    Pt  reports that her sleep quality last night was good, and that her appetite is fair. She denies being in any physical distress today, and verbalizes readiness for discharge tomorrow. She denies any current medications related side effects, and her last BM was earlier today morning. Will continue all medications as listed below.  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   Hypertension    Restless leg syndrome    Sciatica of right side     Past Surgical History:  Procedure Laterality Date   CARPAL TUNNEL RELEASE Bilateral    ECTOPIC PREGNANCY SURGERY     SHOULDER SURGERY Right    TUBAL LIGATION     Family History:  Family History  Problem Relation Age of Onset   Hypertension Father    Stroke Father    Family Psychiatric  History: none reported Social History:  Social History   Substance and Sexual Activity  Alcohol Use No     Social History   Substance and Sexual Activity  Drug Use Yes  Types: "Crack" cocaine   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    Social History   Socioeconomic History   Marital status: Single    Spouse name: Not on file   Number of children: 3   Years of education: Not on file   Highest education level: GED or equivalent  Occupational History   Not on file  Tobacco Use   Smoking status: Former    Types: Cigarettes    Quit date: 08/25/1995    Years since quitting: 26.5     Passive exposure: Past   Smokeless tobacco: Never  Vaping Use   Vaping Use: Never used  Substance and Sexual Activity   Alcohol use: No   Drug use: Yes    Types: "Crack" cocaine    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   Sexual activity: Not Currently    Birth control/protection: Surgical  Other Topics Concern   Not on file  Social History Narrative   Not on file   Social Determinants of Health   Financial Resource Strain: Not on file  Food Insecurity: No Food Insecurity (11/06/2021)   Hunger Vital Sign    Worried About Running Out of Food in the Last Year: Never true    Ran Out of Food in the Last Year: Never true  Transportation Needs: No Transportation Needs (11/06/2021)   PRAPARE - Hydrologist (Medical): No    Lack of Transportation (Non-Medical): No  Physical Activity: Not on file  Stress: Not on file  Social Connections: Not on file   Additional Social History:   Sleep: Poor  Appetite:  Fair  Current Medications: Current Facility-Administered Medications  Medication Dose Route Frequency Provider Last Rate Last Admin   acetaminophen (TYLENOL) tablet 650 mg  650 mg Oral Q6H PRN Nicholes Rough, NP   650 mg at 03/15/22 1247   albuterol (VENTOLIN HFA) 108 (90 Base) MCG/ACT inhaler 1-2 puff  1-2 puff Inhalation Q4H PRN Massengill, Ovid Curd, MD       alum & mag hydroxide-simeth (MAALOX/MYLANTA) 200-200-20 MG/5ML suspension 30 mL  30 mL Oral Q4H PRN Ntuen, Kris Hartmann, FNP       DULoxetine (CYMBALTA) DR capsule 30 mg  30 mg Oral BID Massengill, Ovid Curd, MD   30 mg at 03/15/22 0749   gabapentin (NEURONTIN) capsule 900 mg  900 mg Oral BID Massengill, Ovid Curd, MD   900 mg at 03/15/22 0750   hydrOXYzine (ATARAX) tablet 25 mg  25 mg Oral TID PRN Laretta Bolster, FNP   25 mg at 03/14/22 2007   losartan (COZAAR) tablet 50 mg  50 mg Oral Daily Onuoha, Chinwendu V, NP   50 mg at 03/15/22 0749   magnesium hydroxide (MILK OF MAGNESIA)  suspension 30 mL  30 mL Oral Daily PRN Ntuen, Tina C, FNP   30 mL at 03/12/22 1151   pantoprazole (PROTONIX) EC tablet 40 mg  40 mg Oral Daily Massengill, Ovid Curd, MD   40 mg at 03/15/22 0749   phenylephrine-shark liver oil-mineral oil-petrolatum (PREPARATION H) rectal ointment 1 Application  1 Application Rectal BID PRN Nicholes Rough, NP   1 Application at 78/93/81 1251   SUMAtriptan (IMITREX) tablet 25 mg  25 mg Oral Once Onuoha, Chinwendu V, NP       SUMAtriptan (IMITREX) tablet 25 mg  25 mg Oral Q2H PRN Massengill, Ovid Curd, MD   25 mg at 03/15/22 0751   tiZANidine (ZANAFLEX)  tablet 2 mg  2 mg Oral Q8H PRN Onuoha, Chinwendu V, NP   2 mg at 03/14/22 2007   topiramate (TOPAMAX) tablet 25 mg  25 mg Oral BID Massengill, Ovid Curd, MD   25 mg at 03/15/22 0749   traZODone (DESYREL) tablet 50 mg  50 mg Oral QHS Nicholes Rough, NP   50 mg at 03/14/22 2007    Lab Results:  No results found for this or any previous visit (from the past 48 hour(s)).   Blood Alcohol level:  Lab Results  Component Value Date   ETH <10 03/10/2022   ETH <10 81/85/6314    Metabolic Disorder Labs: Lab Results  Component Value Date   HGBA1C 5.7 (H) 03/12/2022   MPG 116.89 03/12/2022   MPG 119.76 10/08/2021   No results found for: "PROLACTIN" Lab Results  Component Value Date   CHOL 218 (H) 03/13/2022   TRIG 366 (H) 03/13/2022   HDL 38 (L) 03/13/2022   CHOLHDL 5.7 03/13/2022   VLDL 73 (H) 03/13/2022   LDLCALC 107 (H) 03/13/2022   LDLCALC 90 10/08/2021    Physical Findings: AIMS: Facial and Oral Movements Muscles of Facial Expression: None, normal Lips and Perioral Area: None, normal Jaw: None, normal Tongue: None, normal,Extremity Movements Upper (arms, wrists, hands, fingers): None, normal Lower (legs, knees, ankles, toes): None, normal, Trunk Movements Neck, shoulders, hips: None, normal, Overall Severity Severity of abnormal movements (highest score from questions above): None, normal Incapacitation  due to abnormal movements: None, normal Patient's awareness of abnormal movements (rate only patient's report): No Awareness, Dental Status Current problems with teeth and/or dentures?: No Does patient usually wear dentures?: No  CIWA:    COWS:     Musculoskeletal: Strength & Muscle Tone: within normal limits Gait & Station: normal Patient leans: N/A  Psychiatric Specialty Exam:  Presentation  General Appearance: Appropriate for Environment; Fairly Groomed  Eye Contact:Good  Speech:Clear and Coherent  Speech Volume:Normal  Handedness:Right   Mood and Affect  Mood:Euthymic  Affect:Appropriate; Congruent   Thought Process  Thought Processes:Coherent  Descriptions of Associations:Intact  Orientation:Full (Time, Place and Person)  Thought Content:Logical  History of Schizophrenia/Schizoaffective disorder:No data recorded Duration of Psychotic Symptoms:No data recorded Hallucinations:Hallucinations: None  Ideas of Reference:None  Suicidal Thoughts:Suicidal Thoughts: No  Homicidal Thoughts:Homicidal Thoughts: No   Sensorium  Memory:Immediate Good  Judgment:Fair  Insight:Fair   Executive Functions  Concentration:Fair  Attention Span:Fair  [place]   Psychomotor Activity  Psychomotor Activity:Psychomotor Activity: Normal   Assets  Assets:Communication Skills; Housing   Sleep  Sleep:Sleep: Good    Physical Exam: Physical Exam Constitutional:      Appearance: Normal appearance.  HENT:     Head: Normocephalic.     Nose: Nose normal. No congestion or rhinorrhea.  Eyes:     Pupils: Pupils are equal, round, and reactive to light.  Pulmonary:     Effort: Pulmonary effort is normal.  Musculoskeletal:     Cervical back: Normal range of motion.  Neurological:     Mental Status: She is alert and oriented to person, place, and time.  Psychiatric:        Behavior: Behavior normal.    Review of  Systems  Constitutional: Negative.   HENT: Negative.    Eyes: Negative.   Respiratory: Negative.    Cardiovascular: Negative.   Gastrointestinal: Negative.   Genitourinary: Negative.   Musculoskeletal: Negative.   Skin: Negative.   Psychiatric/Behavioral:  Positive for depression (improving with current  medications) and substance abuse. Negative for hallucinations, memory loss and suicidal ideas. The patient is not nervous/anxious and does not have insomnia.    Blood pressure (!) 117/91, pulse 90, temperature 98.2 F (36.8 C), resp. rate 14, height '5\' 6"'$  (1.676 m), weight 89.2 kg, SpO2 98 %. Body mass index is 31.73 kg/m.  Treatment Plan Summary: Daily contact with patient to assess and evaluate symptoms and progress in treatment and Medication management   Observation Level/Precautions:  15 minute checks  Laboratory:  Labs reviewed   Psychotherapy:  Unit Group sessions  Medications:  See [facility]  Consultations:  To be determined   Discharge Concerns:  Safety, medication compliance, mood stability  Estimated LOS: 5-7 days  Other:  N/A    PLAN Safety and Monitoring: Voluntary admission to inpatient psychiatric unit for safety, stabilization and treatment Daily contact with patient to assess and evaluate symptoms and progress in treatment Patient's case to be discussed in multi-disciplinary team meeting Observation Level : q15 minute checks Vital signs: q12 hours Precautions: Safety   Long Term Goal(s): Improvement in symptoms so as ready for discharge   Short Term Goals: Ability to identify changes in lifestyle to reduce recurrence of condition will improve, Ability to verbalize feelings will improve, Ability to disclose and discuss suicidal ideas, Ability to demonstrate self-control will improve, Ability to identify and develop effective coping behaviors will improve, Compliance with prescribed medications will improve, and Ability to identify triggers associated with substance  abuse/mental health issues will improve   Physician Treatment Plan for Secondary Diagnosis:  Principal Problem:   MDD (major depressive disorder), recurrent, severe, with psychosis ([place]) Active Problems:   GAD (generalized anxiety disorder)   PTSD (post-traumatic stress disorder)   Intermittent explosive disorder   Stimulant use disorder                        Medications MDD (major depressive disorder), recurrent, severe -Continue Cymbalta to 30 mg BID (home med) -Discontinued Seroquel 50 mg nightly for mood stabilization per pt's request   Migraines -Continue Topamax 25 mg BID -Continue Imitrex 25 mg PRN Q 2 H for migraines   Anxiety -Continue Hydroxyzine 25 mg every 6 hours PRN -Continue Gabapentin 900 mg BID (home  med)   Insomnia -Continue Trazodone 50 mg nightly and schedule to 8 pm nightly   HTN -Continue Losartan 50 mg daily for htn   GERD -Continue Protonix 40 mg daily  Labs Reviewed: EKG WNL, pt with a reported history of hyperthyroidism, states that she has had some type of thyroid surgery in the past, TSH 0.129, Free T3, Free T4 WNL. Triglycerides 366, pt educated on the need for healthy food choices and exercise, and will need PCP f/u. UA with moderate leukocytes initially, and repeated with small leukocytes. Pt asymptomatic and treatment not necessary at this time.  Other PRNS -Continue Tylenol 650 mg every 6 hours PRN for mild pain -Continue Maalox 30 mg every 4 hrs PRN for indigestion -Continue Milk of Magnesia as needed every 6 hrs for constipation   Discharge Planning: Social work and case management to assist with discharge planning and identification of hospital follow-up needs prior to discharge Estimated LOS: 5-7 days Discharge Concerns: Need to establish a safety plan; Medication compliance and effectiveness Discharge Goals: Return home with outpatient referrals for mental health follow-up including medication management/psychotherapy   Nicholes Rough, NP 03/15/2022, 1:15 PMPatient ID: Briana Wyatt, female   DOB: 1971/07/11, 51 y.o.  MRN: [id number] Patient ID: Briana Wyatt, female   DOB: 1971/08/13, 51 y.o.   MRN: [id number]  I have reviewed the patient's chart and discussed the case with the APP. I agree with the assessment and plan of care as documented in the APP's note.   Viann Fish, MD, [facility]

## 2022-03-16 DIAGNOSIS — F333 Major depressive disorder, recurrent, severe with psychotic symptoms: Secondary | ICD-10-CM

## 2022-03-16 DIAGNOSIS — I10 Essential (primary) hypertension: Secondary | ICD-10-CM

## 2022-03-16 DIAGNOSIS — F329 Major depressive disorder, single episode, unspecified: Secondary | ICD-10-CM

## 2022-03-16 DIAGNOSIS — M5430 Sciatica, unspecified side: Secondary | ICD-10-CM

## 2022-03-16 DIAGNOSIS — F332 Major depressive disorder, recurrent severe without psychotic features: Principal | ICD-10-CM

## 2022-03-16 MED ORDER — ALBUTEROL SULFATE HFA 108 (90 BASE) MCG/ACT IN AERS
1.0000 | INHALATION_SPRAY | RESPIRATORY_TRACT | Status: DC | PRN
Start: 2022-03-16 — End: 2022-08-31

## 2022-03-16 MED ORDER — TRAZODONE HCL 50 MG PO TABS: 50.0000 mg | ORAL_TABLET | Freq: Every day | ORAL | 0 refills | Status: DC

## 2022-03-16 MED ORDER — GABAPENTIN 300 MG PO CAPS: 900.0000 mg | ORAL_CAPSULE | Freq: Two times a day (BID) | ORAL | 0 refills | Status: DC

## 2022-03-16 MED ORDER — PHENYLEPHRINE-MINERAL OIL-PET 0.25-14-74.9 % RE OINT: 1.0000 | TOPICAL_OINTMENT | Freq: Two times a day (BID) | RECTAL | Status: DC | PRN

## 2022-03-16 MED ORDER — DULOXETINE HCL 30 MG PO CPEP: 30.0000 mg | ORAL_CAPSULE | Freq: Two times a day (BID) | ORAL | 0 refills | Status: DC

## 2022-03-16 MED ORDER — HYDROXYZINE HCL 25 MG PO TABS
25.0000 mg | ORAL_TABLET | Freq: Three times a day (TID) | ORAL | 0 refills | Status: AC | PRN
Start: 2022-03-16 — End: 2022-04-15

## 2022-03-16 MED ORDER — LOSARTAN POTASSIUM 50 MG PO TABS
50.0000 mg | ORAL_TABLET | Freq: Every day | ORAL | 0 refills | Status: DC
Start: 2022-03-17 — End: 2022-03-30

## 2022-03-16 MED ORDER — TOPIRAMATE 25 MG PO TABS: 25.0000 mg | ORAL_TABLET | Freq: Two times a day (BID) | ORAL | 0 refills | Status: DC

## 2022-03-16 NOTE — Progress Notes (Signed)
  [facility] Adult Case Management Discharge Plan :  Will you be returning to the same living situation after discharge:  Yes,  [place]  At discharge, do you have transportation home?: Yes,  Taxi  Do you have the ability to pay for your medications: Yes,  Community or Family Support   Release of information consent forms completed and in the chart;  Patient's signature needed at discharge.  Patient to Follow up at:  Follow-up Information     Services, [facility] Follow up.   Why: You may call or go to this provider for interim therapy services. Contact information: [place] [address] [place] on 03/19/2022.   Why: You have an appointment with Lavella Lemons May for medication management services on 03/19/22 at 4:00 pm, in person.    You also have an appointment for therapy services on 04/06/22 at 2:15 pm, in person.   * New Address:  [address], [place], [place]. Contact information: [address] [place] [address] [phone]                 Next level of care provider has access to [place] and Suicide Prevention discussed: Yes,  with patient and mother      Has patient been referred to the Quitline?: N/A patient is not a smoker  Patient has been referred for addiction treatment: Pt. refused referral  Darleen Crocker, [place] 03/16/2022, 9:47 AM

## 2022-03-16 NOTE — Progress Notes (Signed)
Leafy Kindle  D/C'd Home per MD order.  Discussed with the patient and all questions fully answered.  An After Visit Summary was printed and given to the patient. Patient received prescription.  D/c education completed with patient including follow up instructions, medication list, d/c activities limitations if indicated, with other d/c instructions as indicated by MD - patient able to verbalize understanding, all questions fully answered.   Patient instructed to return to ED, call 911, or call MD for any changes in condition.   Patient escorted to the main entrance and D/C home via private auto.  Manoah Deckard O Uzziel Russey 03/16/2022 1:50 PM

## 2022-03-16 NOTE — Group Note (Signed)
LCSW Group Therapy Note  Group Date: 03/16/2022 Start Time: 1300 End Time: 1400   Type of Therapy and Topic:  Group Therapy - Healthy vs Unhealthy Coping Skills  Participation Level:  Did Not Attend   Description of Group The focus of this group was to determine what unhealthy coping techniques typically are used by group members and what healthy coping techniques would be helpful in coping with various problems. Patients were guided in becoming aware of the differences between healthy and unhealthy coping techniques. Patients were asked to identify 2-3 healthy coping skills they would like to learn to use more effectively.  Therapeutic Goals Patients learned that coping is what human beings do all day long to deal with various situations in their lives Patients defined and discussed healthy vs unhealthy coping techniques Patients identified their preferred coping techniques and identified whether these were healthy or unhealthy Patients determined 2-3 healthy coping skills they would like to become more familiar with and use more often. Patients provided support and ideas to each other   Summary of Patient Progress:  Did not attend    Therapeutic Modalities Cognitive Behavioral Therapy Motivational Interviewing  Darleen Crocker, [place] 03/16/2022  1:48 PM

## 2022-03-16 NOTE — Discharge Summary (Signed)
Physician Discharge Summary Note  Patient:  Briana Wyatt is an 51 y.o., female MRN:  [id number] DOB:  Jul 08, 1971 Patient phone:  [phone] (home)  Patient address:   [place] [address],  Total Time spent with patient: 30 minutes  Date of Admission:  03/11/2022 Date of Discharge: 03/16/2022  Reason for Admission:  Briana Wyatt is a 50 year old female with past psychiatric history of major depressive disorder and crack cocaine use disorder. Pt has a documented history of suicidal ideations in the past and was recently evaluated at the [facility] 01/18/22 for SI with plan by asphyxiation.  Pt presented to voluntarily to [facility] on 03/10/22 for suicidal ideation and overdosing on her melatonin and gabapentin pills last week.   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   Hypertension    Restless leg syndrome    Sciatica of right side     Past Surgical History:  Procedure Laterality Date   CARPAL TUNNEL RELEASE Bilateral    ECTOPIC PREGNANCY SURGERY     SHOULDER SURGERY Right    TUBAL LIGATION     Family History:  Family History  Problem Relation Age of Onset   Hypertension Father    Stroke Father    Family Psychiatric  History: See H & P Social History:  Social History   Substance and Sexual Activity  Alcohol Use No     Social History   Substance and Sexual Activity  Drug Use Yes   Types: "Crack" cocaine   Comment: relapsed on thursday 6  July after 1 year of sobriety    Social History   Socioeconomic History   Marital status: Single    Spouse name: Not on file   Number of children: 3   Years of education: Not on file   Highest education level: GED or equivalent  Occupational History   Not on file  Tobacco Use   Smoking status: Former    Types: Cigarettes    Quit date: 08/25/1995    Years since quitting: 26.5    Passive exposure: Past   Smokeless tobacco: Never  Vaping Use   Vaping Use: Never used  Substance and Sexual Activity   Alcohol use: No   Drug use: Yes    Types: "Crack" cocaine    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   Sexual activity: Not Currently    Birth control/protection: Surgical  Other Topics Concern   Not on file  Social History Narrative   Not on file   Social Determinants of Health   Financial Resource Strain: Not on file  Food Insecurity: No Food Insecurity (11/06/2021)   Hunger Vital Sign    Worried About Running Out of Food in the Last Year: Never true    Ran Out of Food in the Last Year: Never true  Transportation Needs: No Transportation Needs (11/06/2021)   PRAPARE - Hydrologist (Medical): No    Lack of Transportation (Non-Medical): No  Physical Activity: Not on file  Stress: Not on file  Social Connections: Not on file                                          HOSPITAL COURSE: During the patient's hospitalization, patient had extensive initial psychiatric evaluation, and follow-up psychiatric evaluations every day. Psychiatric diagnosss provided upon initial assessment was  MDD ( Major depressive disorder), recurrent, severe, without psychosis.   Patient's psychiatric medications were adjusted on admission as follows: 1:#MDD -Restart Duloxetine 20 mg PO BID for MDD. Previously trialed 30 mg PO daily 01/18/22. Pt non-compliant and stopped taking.    2: #Sciatica -Restart Tizanidine  2 mg PO Q8h prn for muscle  spasm/sciatica. Previously trialed.   3: #Hypertension -Continue Losartan 50 mg PO daily for hypertension. Previously trialed.   During the hospitalization, other adjustments were made to the patient's psychiatric medication regimen with medications at discharge being as follows:   MDD (major depressive disorder), recurrent, severe, with psychosis ([place]) -Continue Cymbalta to 30 mg BID (home med) -Discontinued Seroquel 50 mg nightly for mood stabilization per pt's request   Migraines -Continue Topamax 25 mg BID   Anxiety -Continue Hydroxyzine 25 mg every 6 hours PRN -Continue Gabapentin 900 mg BID (home  med)   Insomnia -Continue Trazodone 50 mg nightly    HTN -Continue Losartan 50 mg daily for htn   GERD -Continue Protonix 40 mg daily   Patient's care was discussed during the interdisciplinary team meeting every day during the hospitalization. The patient denies having side effects to prescribed psychiatric medication. The patient was evaluated each day by a clinical provider to ascertain response to treatment. Improvement was noted by the patient's report of decreasing symptoms, improved sleep and appetite, affect, medication tolerance, behavior, and participation in unit programming.  Patient was asked each day to complete a self inventory noting mood, mental status, pain, new symptoms, anxiety and concerns.     Symptoms were reported as significantly decreased or resolved completely by discharge. On day of discharge, the patient reports that their mood is stable. The patient denied having suicidal thoughts for more than 48 hours prior to discharge.  Patient denies having homicidal thoughts.  Patient denies having auditory hallucinations.  Patient denies any visual hallucinations or other symptoms of psychosis. The patient was motivated to continue taking medication with a goal of continued improvement in mental health.    The patient reports their target psychiatric symptoms of  depression, anxiety & insomnia responded well to the psychiatric medications, and the patient reports overall benefit from this psychiatric hospitalization. Supportive psychotherapy was provided to the patient. The patient also participated in regular group therapy while hospitalized. Coping skills, problem solving as well as relaxation therapies were also part of the unit programming.   Labs were reviewed with the patient, and abnormal results were discussed with the patient. pt with a reported history of hyperthyroidism, states that she has had some type of thyroid surgery in the past, TSH 0.129, Free T3, Free T4 WNL. Triglycerides 366, pt educated on the need for healthy food choices and exercise, and will need PCP f/u. UA with moderate leukocytes initially, and repeated with small leukocytes. Pt asymptomatic and treatment not necessary at this time. Pt has been educated to f/u all of her medical conditions with her Primary care provider.   Total Time spent with patient: 30 minutes    Physical Findings: AIMS: Facial  and Oral Movements Muscles of Facial Expression: None, normal Lips and Perioral Area: None, normal Jaw: None, normal Tongue: None, normal,Extremity Movements Upper (arms, wrists, hands, fingers): None, normal Lower (legs, knees, ankles, toes): None, normal, Trunk Movements Neck, shoulders, hips: None, normal, Overall Severity Severity of abnormal movements (highest score from questions above): None, normal Incapacitation due to abnormal movements: None, normal Patient's awareness of abnormal movements (rate only patient's report): No Awareness, Dental Status Current problems with teeth and/or dentures?: No Does patient usually wear dentures?: No  CIWA:  n/a COWS: n/a    Musculoskeletal: Strength & Muscle Tone: within normal limits Gait & Station: normal Patient leans: N/A  Psychiatric Specialty Exam:  Presentation  General Appearance: Casual  Eye  Contact:Fair  Speech:Normal Rate  Speech Volume:Normal  Handedness:Right   Mood and Affect  Mood:Euthymic; Anxious  Affect:Appropriate; Congruent; Full Range   Thought Process  Thought Processes:Linear  Descriptions of Associations:Intact  Orientation:Full (Time, Place and Person)  Thought Content:Logical  History of Schizophrenia/Schizoaffective disorder:No data recorded Duration of Psychotic Symptoms:No data recorded Hallucinations:Hallucinations: None  Ideas of Reference:None  Suicidal Thoughts:Suicidal Thoughts: No  Homicidal Thoughts:Homicidal Thoughts: No   Sensorium  Memory:Immediate Good; Recent Good; Remote Good  Judgment:Fair  Insight:Fair   Executive Functions  Concentration:Fair  Attention Span:Fair  [place]   Psychomotor Activity  Psychomotor Activity:Psychomotor Activity: Normal   Assets  Assets:Communication Skills; Housing   Sleep  Sleep:Sleep: Fair    Physical Exam: Physical Exam Constitutional:      Appearance: Normal appearance.  HENT:     Head: Normocephalic.     Nose: Nose normal. No congestion or rhinorrhea.  Eyes:     Pupils: Pupils are equal, round, and reactive to light.  Pulmonary:     Effort: Pulmonary effort is normal.  Musculoskeletal:        General: Normal range of motion.     Cervical back: Normal range of motion.  Neurological:     Mental Status: She is alert and oriented to person, place, and time.     Sensory: No sensory deficit.     Coordination: Coordination normal.  Psychiatric:        Behavior: Behavior normal.        Thought Content: Thought content normal.    Review of Systems  Constitutional: Negative.   HENT: Negative.    Eyes: Negative.   Respiratory: Negative.    Cardiovascular: Negative.   Skin: Negative.   Neurological: Negative.   Psychiatric/Behavioral:  Positive for depression (Patient denies SI/HI/AVH, denies paranoia and  verbally contracts for safety outside of [facility]) and substance abuse. Negative for hallucinations, memory loss and suicidal ideas. The patient is not nervous/anxious and does not have insomnia.    Blood pressure 112/67, pulse 91, temperature 97.8 F (36.6 C), temperature source Oral, resp. rate 14, height '5\' 6"'$  (1.676 m), weight 89.2 kg, SpO2 97 %. Body mass index is 31.73 kg/m.   Social History   Tobacco Use  Smoking Status Former   Types: Cigarettes   Quit date: 08/25/1995   Years since quitting: 26.5   Passive exposure: Past  Smokeless Tobacco Never   Tobacco Cessation:  N/A, patient does not currently use tobacco products   Blood Alcohol level:  Lab Results  Component Value Date   ETH <10 03/10/2022   ETH <10 32/35/5732    Metabolic Disorder Labs:  Lab Results  Component Value Date   HGBA1C 5.7 (H) 03/12/2022   MPG 116.89 03/12/2022  MPG 119.76 10/08/2021   No results found for: "PROLACTIN" Lab Results  Component Value Date   CHOL 218 (H) 03/13/2022   TRIG 366 (H) 03/13/2022   HDL 38 (L) 03/13/2022   CHOLHDL 5.7 03/13/2022   VLDL 73 (H) 03/13/2022   LDLCALC 107 (H) 03/13/2022   LDLCALC 90 10/08/2021    See Psychiatric Specialty Exam and Suicide Risk Assessment completed by Attending Physician prior to discharge.  Discharge destination:  Home  Is patient on multiple antipsychotic therapies at discharge:  No   Has Patient had three or more failed trials of antipsychotic monotherapy by history:  No  Recommended Plan for Multiple Antipsychotic Therapies: NA  Discharge Instructions     Diet - low sodium heart healthy   Complete by: As directed    Increase activity slowly   Complete by: As directed       Allergies as of 03/16/2022       Reactions   Codeine Nausea And Vomiting   High doses of codeine pt reports greater than '30mg'$  she can't tolerate   Methocarbamol Itching   "feels like bugs are crawling under skin"   Nsaids    Other reaction(s):  Contraindicated CKD, only one kidney Other reaction(s): Contraindicated CKD, only one kidney        Medication List     STOP taking these medications    SUMAtriptan 25 MG tablet Commonly known as: IMITREX   traMADol 50 MG tablet Commonly known as: ULTRAM       TAKE these medications      Indication  acetaminophen 650 MG CR tablet Commonly known as: TYLENOL Take 650 mg by mouth every 8 (eight) hours as needed for pain or fever.  Indication: Pain   AIMOVIG (140 MG DOSE) [place] One a month  Indication: migraine   albuterol 108 (90 Base) MCG/ACT inhaler Commonly known as: VENTOLIN HFA Inhale 1-2 puffs into the lungs every 4 (four) hours as needed for wheezing or shortness of breath. What changed: See the new instructions.  Indication: Spasm of Lung Air Passages   CALTRATE 600+D PO Take by mouth.  Indication: vitamin   CENTRUM SILVER PO Take 1 tablet by mouth daily.  Indication: vitamin   cetirizine 10 MG tablet Commonly known as: ZYRTEC Take 10 mg by mouth daily.  Indication: Hayfever   DULoxetine 30 MG capsule Commonly known as: CYMBALTA Take 1 capsule (30 mg total) by mouth 2 (two) times daily. What changed:  when to take this Another medication with the same name was removed. Continue taking this medication, and follow the directions you see here.  Indication: Major Depressive Disorder   gabapentin 300 MG capsule Commonly known as: NEURONTIN Take 3 capsules (900 mg total) by mouth 2 (two) times daily.  Indication: Neuropathic Pain, Social Anxiety Disorder   hydrOXYzine 25 MG tablet Commonly known as: ATARAX Take 1 tablet (25 mg total) by mouth 3 (three) times daily as needed for anxiety.  Indication: Feeling Anxious   losartan 50 MG tablet Commonly known as: COZAAR Take 1 tablet (50 mg total) by mouth daily. Start taking on: March 17, 2022 What changed: See the new instructions.  Indication: High Blood Pressure Disorder   omeprazole 40 MG  capsule Commonly known as: PRILOSEC Take 40 mg by mouth daily.  Indication: Gastroesophageal Reflux Disease   phenylephrine-shark liver oil-mineral oil-petrolatum 0.25-14-74.9 % rectal ointment Commonly known as: PREPARATION H Place 1 Application rectally 2 (two) times daily as needed for hemorrhoids.  Indication: hemorrhoids  rizatriptan 10 MG tablet Commonly known as: MAXALT Take 10 mg by mouth as needed for migraine.  Indication: Migraine Headache   tiZANidine 2 MG tablet Commonly known as: ZANAFLEX Take 2 mg by mouth 2 (two) times daily.  Indication: Musculoskeletal Pain   topiramate 25 MG tablet Commonly known as: TOPAMAX Take 1 tablet (25 mg total) by mouth 2 (two) times daily.  Indication: Migraine Headache   traZODone 50 MG tablet Commonly known as: DESYREL Take 1 tablet (50 mg total) by mouth at bedtime.  Indication: Trouble Sleeping   VITAMIN D-3 PO Take 1 tablet by mouth daily.  Indication: vitamin        Follow-up Information     Services, [facility] Follow up.   Why: You may call or go to this provider for interim therapy services. Contact information: [place] [address] [place] on 03/19/2022.   Why: You have an appointment with Lavella Lemons May for medication management services on 03/19/22 at 4:00 pm, in person.    You also have an appointment for therapy services on 04/06/22 at 2:15 pm, in person.   * New Address:  [address], [place], [place]. Contact information: [address] [place] [address] [phone]                Follow-up recommendations:   The patient is able to verbalize their individual safety plan to this provider.   # It is recommended to the patient to continue psychiatric medications as prescribed, after discharge from the hospital.     # It is recommended to the patient to follow up with your outpatient psychiatric provider and PCP.   # It  was discussed with the patient, the impact of alcohol, drugs, tobacco have been there overall psychiatric and medical wellbeing, and total abstinence from substance use was recommended the patient.ed.   # Prescriptions provided or sent directly to preferred pharmacy at discharge. Patient agreeable to plan. Given opportunity to ask questions. Appears to feel comfortable with discharge.    # In the event of worsening symptoms, the patient is instructed to call the crisis hotline, 911 and or go to the nearest ED for appropriate evaluation and treatment of symptoms. To follow-up with primary care provider for other medical issues, concerns and or health care needs   # Patient was discharged home with a plan to follow up as noted above..    SignedNicholes Rough, NP 03/16/2022, 2:21 PM

## 2022-03-16 NOTE — [facility] Group Notes (Signed)
Adult Psychoeducational Group Note  Date:  03/16/2022 Time:  9:47 AM  Group Topic/Focus:  Goals Group:   The focus of this group is to help patients establish daily goals to achieve during treatment and discuss how the patient can incorporate goal setting into their daily lives to aide in recovery.    Dub Mikes 03/16/2022, 9:47 AM

## 2022-03-16 NOTE — [facility] Suicide Risk Assessment (Addendum)
Suicide Risk Assessment  Discharge Assessment    [facility] Discharge Suicide Risk Assessment   Principal Problem: MDD (major depressive disorder), recurrent severe, without psychosis ([place]) Discharge Diagnoses: Principal Problem:   MDD (major depressive disorder), recurrent severe, without psychosis ([place]) Active Problems:   GAD (generalized anxiety disorder)   PTSD (post-traumatic stress disorder)   Intermittent explosive disorder   Stimulant use disorder  Reason For Admission: Briana Wyatt is a 51 year old female with past psychiatric history of major depressive disorder and crack cocaine use disorder. Pt has a documented history of suicidal ideations in the past and was recently evaluated at the [facility] 01/18/22 for SI with plan by asphyxiation.  Pt presented to voluntarily to [place] on 03/10/22 for suicidal ideation and overdosing on her melatonin and gabapentin pills last week.                                     HOSPITAL COURSE: During the patient's hospitalization, patient had extensive initial psychiatric evaluation, and follow-up psychiatric evaluations every day. Psychiatric diagnosss provided upon initial assessment was  MDD ( Major depressive disorder), recurrent, severe, without psychosis.  Patient's psychiatric medications were adjusted on admission as follows: 1:#MDD -Restart Duloxetine 20 mg PO BID for MDD. Previously trialed 30 mg PO daily 01/18/22. Pt non-compliant and stopped taking.    2: #Sciatica -Restart Tizanidine  2 mg PO Q8h prn for muscle spasm/sciatica. Previously trialed.   3: #Hypertension -Continue Losartan 50 mg PO daily for hypertension. Previously trialed.  During the hospitalization, other adjustments were made to the patient's psychiatric medication regimen with medications at discharge being as follows:  MDD (major depressive disorder), recurrent, severe, with psychosis ([place]) -Continue Cymbalta to 30 mg BID (home med) -Discontinued Seroquel 50 mg  nightly for mood stabilization per pt's request   Migraines -Continue Topamax 25 mg BID   Anxiety -Continue Hydroxyzine 25 mg every 6 hours PRN -Continue Gabapentin 900 mg BID (home  med)   Insomnia -Continue Trazodone 50 mg nightly    HTN -Continue Losartan 50 mg daily for htn   GERD -Continue Protonix 40 mg daily  Patient's care was discussed during the interdisciplinary team meeting every day during the hospitalization. The patient denies having side effects to prescribed psychiatric medication. The patient was evaluated each day by a clinical provider to ascertain response to treatment. Improvement was noted by the patient's report of decreasing symptoms, improved sleep and appetite, affect, medication tolerance, behavior, and participation in unit programming.  Patient was asked each day to complete a self inventory noting mood, mental status, pain, new symptoms, anxiety and concerns.    Symptoms were reported as significantly decreased or resolved completely by discharge. On day of discharge, the patient reports that their mood is stable. The patient denied having suicidal thoughts for more than 48 hours prior to discharge.  Patient denies having homicidal thoughts.  Patient denies having auditory hallucinations.  Patient denies any visual hallucinations or other symptoms of psychosis. The patient was motivated to continue taking medication with a goal of continued improvement in mental health.   The patient reports their target psychiatric symptoms of depression, anxiety & insomnia responded well to the psychiatric medications, and the patient reports overall benefit from this psychiatric hospitalization. Supportive psychotherapy was provided to the patient. The patient also participated in regular group therapy while hospitalized. Coping skills, problem solving as well as relaxation therapies were also part of the  unit programming.  Labs were reviewed with the patient, and abnormal  results were discussed with the patient. pt with a reported history of hyperthyroidism, states that she has had some type of thyroid surgery in the past, TSH 0.129, Free T3, Free T4 WNL. Triglycerides 366, pt educated on the need for healthy food choices and exercise, and will need PCP f/u. UA with moderate leukocytes initially, and repeated with small leukocytes. Pt asymptomatic and treatment not necessary at this time. Pt has been educated to f/u all of her medical conditions with her Primary care provider.  Total Time spent with patient: 30 minutes  Musculoskeletal: Strength & Muscle Tone: within normal limits Gait & Station: normal Patient leans: N/A  Psychiatric Specialty Exam  Presentation  General Appearance: Casual  Eye Contact:Fair  Speech:Normal Rate  Speech Volume:Normal  Handedness:Right  Mood and Affect  Mood:Euthymic; Anxious  Duration of Depression Symptoms: No data recorded Affect:Appropriate; Congruent; Full Range  Thought Process  Thought Processes:Linear  Descriptions of Associations:Intact  Orientation:Full (Time, Place and Person)  Thought Content:Logical  History of Schizophrenia/Schizoaffective disorder:No data recorded Duration of Psychotic Symptoms:No data recorded Hallucinations:Hallucinations: None  Ideas of Reference:None  Suicidal Thoughts:Suicidal Thoughts: No  Homicidal Thoughts:Homicidal Thoughts: No  Sensorium  Memory:Immediate Good; Recent Good; Remote Good  Judgment:Fair  Insight:Fair  Executive Functions  Concentration:Fair  Attention Span:Fair  [place]  Psychomotor Activity  Psychomotor Activity:Psychomotor Activity: Normal  Assets  Assets:Communication Skills; Housing  Sleep  Sleep:Sleep: Fair  Physical Exam: Physical Exam Constitutional:      Appearance: Normal appearance.  HENT:     Head: Normocephalic.     Nose: Nose normal. No congestion or rhinorrhea.   Eyes:     Pupils: Pupils are equal, round, and reactive to light.  Musculoskeletal:        General: Normal range of motion.     Cervical back: Normal range of motion.  Neurological:     Mental Status: She is alert and oriented to person, place, and time.     Sensory: No sensory deficit.  Psychiatric:        Behavior: Behavior normal.        Thought Content: Thought content normal.    Review of Systems  Constitutional: Negative.   HENT: Negative.    Eyes: Negative.   Respiratory: Negative.    Cardiovascular: Negative.   Gastrointestinal: Negative.   Genitourinary: Negative.   Musculoskeletal: Negative.   Skin: Negative.   Neurological: Negative.   Psychiatric/Behavioral:  Positive for depression (Patient reports that her depressive symptoms are resolving. She denies SI/HI/AVH and verbally contracts for safety outside of this [facility]) and substance abuse (Not interested in substance abuse rehab at this time). Negative for hallucinations, memory loss and suicidal ideas. The patient is not nervous/anxious and does not have insomnia.    Blood pressure 112/67, pulse 91, temperature 97.8 F (36.6 C), temperature source Oral, resp. rate 14, height '5\' 6"'$  (1.676 m), weight 89.2 kg, SpO2 97 %. Body mass index is 31.73 kg/m.  Mental Status Per Nursing Assessment::   On Admission:  Suicidal ideation indicated by patient  Demographic Factors:  Caucasian and Low socioeconomic status  Loss Factors: Financial problems/change in socioeconomic status  Historical Factors: Impulsivity  Risk Reduction Factors:   Positive social support  Continued Clinical Symptoms:  Patient reports that her depressive symptoms have reduced significantly since hospitalization. She denies SI/HI/AVH currently, and is verbally contracting for safety outside of this [facility].  Cognitive  Features That Contribute To Risk:  None    Suicide Risk:  Mild:  There are no identifiable suicide plans, no associated  intent, mild dysphoria and related symptoms, good self-control (both objective and subjective assessment), few other risk factors, and identifiable protective factors, including available and accessible social support.    Follow-up Information     Services, [facility] Follow up.   Why: You may call or go to this provider for interim therapy services. Contact information: [place] [address] [place] on 03/19/2022.   Why: You have an appointment with Lavella Lemons May for medication management services on 03/19/22 at 4:00 pm, in person.    You also have an appointment for therapy services on 04/06/22 at 2:15 pm, in person.   * New Address:  [address], [place], [place]. Contact information: [address] [place] [address] [phone]                Plan Of Care/Follow-up recommendations:   The patient is able to verbalize their individual safety plan to this provider.  # It is recommended to the patient to continue psychiatric medications as prescribed, after discharge from the hospital.    # It is recommended to the patient to follow up with your outpatient psychiatric provider and PCP.  # It was discussed with the patient, the impact of alcohol, drugs, tobacco have been there overall psychiatric and medical wellbeing, and total abstinence from substance use was recommended the patient.ed.  # Prescriptions provided or sent directly to preferred pharmacy at discharge. Patient agreeable to plan. Given opportunity to ask questions. Appears to feel comfortable with discharge.    # In the event of worsening symptoms, the patient is instructed to call the crisis hotline, 911 and or go to the nearest ED for appropriate evaluation and treatment of symptoms. To follow-up with primary care provider for other medical issues, concerns and or health care needs  # Patient was discharged home with a plan to follow up  as noted above.Nicholes Rough, NP 03/16/2022, 10:58 AM  Total Time Spent in Direct Patient Care:  I personally spent 35 minutes on the unit in direct patient care. The direct patient care time included face-to-face time with the patient, reviewing the patient's chart, communicating with other professionals, and coordinating care. Greater than 50% of this time was spent in counseling or coordinating care with the patient regarding goals of hospitalization, psycho-education, and discharge planning needs.  On my assessment the patient denied SI, HI, AVH, paranoia, ideas of reference, or first rank symptoms on day of discharge. Patient denied drug cravings or active signs of withdrawal. Patient denied medication side-effects. Patient was not deemed to be a danger to self or others on day of discharge and was in agreement with discharge plans.   I have independently evaluated the patient during a face-to-face assessment on the day of discharge. I reviewed the patient's chart, and I participated in key portions of the service. I discussed the case with the APP, and I agree with the assessment and plan of care as documented in the APP's note, as addended by me or notated below:  I agree with the SRA.  Janine Limbo, MD Psychiatrist

## 2022-03-17 ENCOUNTER — Telehealth (INDEPENDENT_AMBULATORY_CARE_PROVIDER_SITE_OTHER): Payer: Self-pay

## 2022-03-17 ENCOUNTER — Other Ambulatory Visit (INDEPENDENT_AMBULATORY_CARE_PROVIDER_SITE_OTHER): Payer: Self-pay

## 2022-03-17 ENCOUNTER — Other Ambulatory Visit: Payer: Self-pay | Admitting: Physician Assistant

## 2022-03-17 DIAGNOSIS — R0683 Snoring: Secondary | ICD-10-CM

## 2022-03-17 DIAGNOSIS — Z01812 Encounter for preprocedural laboratory examination: Secondary | ICD-10-CM

## 2022-03-17 MED ORDER — PEG 3350-KCL-NA BICARB-NACL 420 G PO SOLR: 4000.0000 mL | ORAL | 0 refills | Status: DC

## 2022-03-17 NOTE — Telephone Encounter (Signed)
Kuulei Kleier Ann Dyshawn Cangelosi, CMA  ?

## 2022-03-24 ENCOUNTER — Encounter: Payer: Self-pay | Admitting: Physician Assistant

## 2022-03-25 NOTE — Telephone Encounter (Signed)
LPN called and informed pt that her psychiatrist would be the one to make the diagnosis whether she has terets. Pt verbalized understanding and states she has an appt with her psychiatrst soon and will ask about it then.

## 2022-03-26 ENCOUNTER — Other Ambulatory Visit (HOSPITAL_COMMUNITY): Payer: Self-pay | Admitting: Family

## 2022-03-26 ENCOUNTER — Telehealth: Payer: Self-pay | Admitting: Orthopedic Surgery

## 2022-03-26 ENCOUNTER — Other Ambulatory Visit: Payer: Self-pay | Admitting: Physician Assistant

## 2022-03-26 NOTE — Telephone Encounter (Signed)
Patient left message / call returned - states she called for refill of "generic Tramadol" which she gets through [facility] program:  Topiranate 25 mg, 1 tablet, twice a day   - please advise pt [phone]

## 2022-03-30 ENCOUNTER — Encounter (INDEPENDENT_AMBULATORY_CARE_PROVIDER_SITE_OTHER): Payer: Self-pay

## 2022-03-30 ENCOUNTER — Other Ambulatory Visit (INDEPENDENT_AMBULATORY_CARE_PROVIDER_SITE_OTHER): Payer: Self-pay

## 2022-03-30 ENCOUNTER — Encounter: Payer: Self-pay | Admitting: Physician Assistant

## 2022-03-30 ENCOUNTER — Other Ambulatory Visit: Payer: Self-pay

## 2022-03-30 ENCOUNTER — Other Ambulatory Visit (HOSPITAL_COMMUNITY)
Admission: RE | Admit: 2022-03-30 | Discharge: 2022-03-30 | Disposition: A | Discharge status: Home / Self Care | Payer: Self-pay | Source: Ambulatory Visit | Attending: Gastroenterology | Admitting: Gastroenterology

## 2022-03-30 ENCOUNTER — Ambulatory Visit: Payer: Medicaid Other | Admitting: Physician Assistant

## 2022-03-30 ENCOUNTER — Emergency Department (HOSPITAL_COMMUNITY)
Admission: EM | Admit: 2022-03-30 | Discharge: 2022-03-31 | Disposition: A | Discharge status: Home / Self Care | Payer: Self-pay | Attending: Emergency Medicine | Admitting: Emergency Medicine

## 2022-03-30 ENCOUNTER — Encounter (HOSPITAL_COMMUNITY): Payer: Self-pay

## 2022-03-30 VITALS — BP 130/79 | HR 94 | Temp 98.6°F

## 2022-03-30 DIAGNOSIS — Z01812 Encounter for preprocedural laboratory examination: Secondary | ICD-10-CM

## 2022-03-30 DIAGNOSIS — Y92009 Unspecified place in unspecified non-institutional (private) residence as the place of occurrence of the external cause: Secondary | ICD-10-CM | POA: Insufficient documentation

## 2022-03-30 DIAGNOSIS — R609 Edema, unspecified: Secondary | ICD-10-CM

## 2022-03-30 DIAGNOSIS — R531 Weakness: Secondary | ICD-10-CM | POA: Insufficient documentation

## 2022-03-30 DIAGNOSIS — R0602 Shortness of breath: Secondary | ICD-10-CM | POA: Insufficient documentation

## 2022-03-30 DIAGNOSIS — Z79899 Other long term (current) drug therapy: Secondary | ICD-10-CM | POA: Insufficient documentation

## 2022-03-30 DIAGNOSIS — Y9301 Activity, walking, marching and hiking: Secondary | ICD-10-CM | POA: Insufficient documentation

## 2022-03-30 DIAGNOSIS — W1839XA Other fall on same level, initial encounter: Secondary | ICD-10-CM | POA: Insufficient documentation

## 2022-03-30 DIAGNOSIS — I10 Essential (primary) hypertension: Secondary | ICD-10-CM | POA: Insufficient documentation

## 2022-03-30 DIAGNOSIS — J45909 Unspecified asthma, uncomplicated: Secondary | ICD-10-CM | POA: Insufficient documentation

## 2022-03-30 DIAGNOSIS — R600 Localized edema: Secondary | ICD-10-CM | POA: Insufficient documentation

## 2022-03-30 DIAGNOSIS — F489 Nonpsychotic mental disorder, unspecified: Secondary | ICD-10-CM

## 2022-03-30 DIAGNOSIS — W19XXXA Unspecified fall, initial encounter: Secondary | ICD-10-CM

## 2022-03-30 LAB — PREGNANCY, URINE: Preg Test, Ur: NEGATIVE

## 2022-03-30 MED ORDER — FUROSEMIDE 20 MG PO TABS: 20.0000 mg | ORAL_TABLET | Freq: Every day | ORAL | 0 refills | Status: DC

## 2022-03-30 NOTE — Progress Notes (Signed)
BP 130/79   Pulse 94   Temp 98.6 F (37 C)   SpO2 97%    Subjective:    Patient ID: Briana Wyatt, female    DOB: 25-Jul-1971, 51 y.o.   MRN: [id number]  HPI: Briana Wyatt is a 51 y.o. female presenting on 03/30/2022 for No chief complaint on file.   HPI   She fell on Saturday.  She remembers waking up when she fell.  She wonders if she was sleep-walking.   She fell inside her camper.  Fell from sunken area to the unsunken area.  She hit her head and her entire left side on the elevated side and right side hit the storage area.    Today she is hurting- her whole head, her neck, her left shoulder all the way down to hands, her back,  and her feet are swollen and left calf hurts.    Pt thinks she has narcolepsy.   (Last week got email from pt thinking she had tourrette's)  She has appt with psychiatrist Thursday.   She says This will be her first appt with the psychiatrist  Pt has not been bed-ridden; she says she has been up and walking every day, she continues to go inside her mother's house for bathroom or kitchen use.  She says she has had some swelling of the lower extremities that started a day or two before she fell.      Relevant past medical, surgical, family and social history reviewed and updated as indicated. Interim medical history since our last visit reviewed. Allergies and medications reviewed and updated.   CURRENT MEDS: She is unsure and didn't bring her meds with her  Review of Systems  Per HPI unless specifically indicated above     Objective:    BP 130/79   Pulse 94   Temp 98.6 F (37 C)   SpO2 97%   Wt Readings from Last 3 Encounters:  03/10/22 197 lb (89.4 kg)  03/10/22 197 lb 4.8 oz (89.5 kg)  02/19/22 200 lb 3.2 oz (90.8 kg)    Physical Exam Vitals reviewed.  Constitutional:      General: She is not in acute distress.    Appearance: She is well-developed. She is not toxic-appearing.  HENT:     Head: Normocephalic and  atraumatic.     Comments: No bruising or abrasion to head/face Eyes:     Extraocular Movements: Extraocular movements intact.     Conjunctiva/sclera: Conjunctivae normal.     Pupils: Pupils are equal, round, and reactive to light.  Cardiovascular:     Rate and Rhythm: Normal rate and regular rhythm.  Pulmonary:     Effort: Pulmonary effort is normal.     Breath sounds: Normal breath sounds.  Abdominal:     General: Bowel sounds are normal.     Palpations: Abdomen is soft. There is no mass.     Tenderness: There is no abdominal tenderness.  Musculoskeletal:        General: Tenderness present.     Right shoulder: No deformity.     Left shoulder: No deformity.     Right elbow: No swelling. Normal range of motion.     Left elbow: No swelling. Normal range of motion.     Cervical back: Neck supple.     Right hip: No deformity. Normal range of motion.     Left hip: No deformity. Normal range of motion.     Right knee: No deformity. Normal range  of motion.     Left knee: No deformity. Normal range of motion.     Right lower leg: Edema present.     Left lower leg: Edema present.     Right ankle: No deformity. Normal range of motion.     Left ankle: No deformity. Normal range of motion.     Comments: Superficial abrasion right thoracic back; no ecchymosis.   Pt hollers with pain anywhere she is touched over her entire body, nowhere more or less than any other place.    Lymphadenopathy:     Cervical: No cervical adenopathy.  Skin:    General: Skin is warm and dry.  Neurological:     Mental Status: She is alert and oriented to person, place, and time.     Cranial Nerves: Cranial nerves 2-12 are intact. No facial asymmetry.     Motor: No weakness or tremor.     Gait: Gait is intact.  Psychiatric:        Attention and Perception: Attention normal.        Behavior: Behavior is cooperative.     Comments: Pt at baseline            Assessment & Plan:    Encounter Diagnoses   Name Primary?   Fall, initial encounter Yes   Edema, unspecified type    Mental health problem      Pt is Already taking gabapentin '900mg'$  bid and is Already taking zanaflex '2mg'$  bid and can't take nsaids.    Pt in with similar all-over severe pain at office visit 12/18/21-     Colonoscopy 8/9-  Psychiatrist 8/10- Orthopedist- 8/15 Surgery- 8/31 Rheumatolog 9/18 Endocrin/thyroid- 11/1   Pt given rx Lasix o take only for one to two week.  She is encouraged to walk regularly, elevate feet when seated.   No indication for imaging seen today  Will see pt back 3-4 week to recheck edema, overall.  She is to contact office sooner for any changes, new symptoms.

## 2022-03-30 NOTE — ED Triage Notes (Signed)
Foot swelling started Saturday morning. Had a fall Saturday night and hit her left side. Pt hit her neck on the steps.

## 2022-03-31 ENCOUNTER — Other Ambulatory Visit: Payer: Self-pay

## 2022-03-31 ENCOUNTER — Emergency Department (HOSPITAL_COMMUNITY): Payer: Self-pay

## 2022-03-31 LAB — CBC WITH DIFFERENTIAL/PLATELET
Abs Immature Granulocytes: 0.02 10*3/uL (ref 0.00–0.07)
Basophils Absolute: 0 10*3/uL (ref 0.0–0.1)
Basophils Relative: 1 %
Eosinophils Absolute: 0.2 10*3/uL (ref 0.0–0.5)
Eosinophils Relative: 3 %
HCT: 32.5 % — ABNORMAL LOW (ref 36.0–46.0)
Hemoglobin: 10.8 g/dL — ABNORMAL LOW (ref 12.0–15.0)
Immature Granulocytes: 0 %
Lymphocytes Relative: 32 %
Lymphs Abs: 1.7 10*3/uL (ref 0.7–4.0)
MCH: 28.6 pg (ref 26.0–34.0)
MCHC: 33.2 g/dL (ref 30.0–36.0)
MCV: 86 fL (ref 80.0–100.0)
Monocytes Absolute: 0.7 10*3/uL (ref 0.1–1.0)
Monocytes Relative: 14 %
Neutro Abs: 2.7 10*3/uL (ref 1.7–7.7)
Neutrophils Relative %: 50 %
Platelets: 191 10*3/uL (ref 150–400)
RBC: 3.78 MIL/uL — ABNORMAL LOW (ref 3.87–5.11)
RDW: 12.4 % (ref 11.5–15.5)
WBC: 5.3 10*3/uL (ref 4.0–10.5)
nRBC: 0 % (ref 0.0–0.2)

## 2022-03-31 LAB — BASIC METABOLIC PANEL
Anion gap: 3 — ABNORMAL LOW (ref 5–15)
BUN: 22 mg/dL — ABNORMAL HIGH (ref 6–20)
CO2: 26 mmol/L (ref 22–32)
Calcium: 8.8 mg/dL — ABNORMAL LOW (ref 8.9–10.3)
Chloride: 106 mmol/L (ref 98–111)
Creatinine, Ser: 1.12 mg/dL — ABNORMAL HIGH (ref 0.44–1.00)
GFR, Estimated: 59 mL/min — ABNORMAL LOW (ref >60)
Glucose, Bld: 127 mg/dL — ABNORMAL HIGH (ref 70–99)
Potassium: 3.7 mmol/L (ref 3.5–5.1)
Sodium: 135 mmol/L (ref 135–145)

## 2022-03-31 LAB — BRAIN NATRIURETIC PEPTIDE: B Natriuretic Peptide: 7 pg/mL (ref 0.0–100.0)

## 2022-03-31 MED ORDER — FUROSEMIDE 10 MG/ML IJ SOLN
40.0000 mg | Freq: Once | INTRAMUSCULAR | Status: AC
  Administered 2022-03-31: 40 mg via INTRAVENOUS
  Filled 2022-03-31: qty 4

## 2022-03-31 NOTE — Discharge Instructions (Addendum)
Fill the diuretic prescription you were given by your primary doctor today.  Follow-up with primary doctor in the next week.

## 2022-03-31 NOTE — ED Provider Notes (Signed)
[facility] EMERGENCY DEPARTMENT Provider Note   CSN: [id number] Arrival date & time: 03/30/22  2130     History  No chief complaint on file.   Briana Wyatt is a 51 y.o. female.  Patient is a 51 year old female with history of solitary kidney, hypertension, asthma, PTSD.  Patient presenting today for evaluation of leg swelling, weakness, and fall.  Patient describes swelling in her legs for the past several days.  2 days ago, she apparently fell while walking in her home.  She describes no loss of consciousness, neck pain, or other injury.  She reports that she feels short of breath, especially when she lies flat and sleeps.  She denies fevers or chills.  She denies productive cough.  The history is provided by the patient.       Home Medications Prior to Admission medications   Medication Sig Start Date End Date Taking? Authorizing Provider  acetaminophen (TYLENOL) 650 MG CR tablet Take 650 mg by mouth every 8 (eight) hours as needed for pain or fever. 03/02/19   [provider]  albuterol (VENTOLIN HFA) 108 (90 Base) MCG/ACT inhaler Inhale 1-2 puffs into the lungs every 4 (four) hours as needed for wheezing or shortness of breath. 03/16/22   Massengill, Ovid Curd, MD  Calcium Carbonate-Vitamin D (CALTRATE 600+D PO) Take by mouth.    [provider]  cetirizine (ZYRTEC) 10 MG tablet Take 10 mg by mouth daily.    [provider]  Cholecalciferol (VITAMIN D-3 PO) Take 1 tablet by mouth daily.    [provider]  DULoxetine (CYMBALTA) 30 MG capsule Take 1 capsule (30 mg total) by mouth 2 (two) times daily. 03/16/22 04/15/22  Massengill, Ovid Curd, MD  Erenumab-aooe (AIMOVIG, 140 MG DOSE, [place]) One a month 02/18/22   [provider]  furosemide (LASIX) 20 MG tablet Take 1 tablet (20 mg total) by mouth daily. 03/30/22   Soyla Dryer, PA-C  gabapentin (NEURONTIN) 300 MG capsule Take 3 capsules (900 mg total) by mouth 2 (two) times daily. 03/16/22  04/15/22  Massengill, Ovid Curd, MD  hydrOXYzine (ATARAX) 25 MG tablet Take 1 tablet (25 mg total) by mouth 3 (three) times daily as needed for anxiety. 03/16/22 04/15/22  Massengill, Ovid Curd, MD  losartan (COZAAR) 50 MG tablet TAKE 1 Tablet BY MOUTH ONCE EVERY DAY 03/30/22   Soyla Dryer, PA-C  Multiple Vitamins-Minerals (CENTRUM SILVER PO) Take 1 tablet by mouth daily.    [provider]  omeprazole (PRILOSEC) 40 MG capsule TAKE 1 Capsule BY MOUTH ONCE EVERY DAY 03/30/22   Soyla Dryer, PA-C  phenylephrine-shark liver oil-mineral oil-petrolatum (PREPARATION H) 0.25-14-74.9 % rectal ointment Place 1 Application rectally 2 (two) times daily as needed for hemorrhoids. 03/16/22   Massengill, Ovid Curd, MD  polyethylene glycol-electrolytes (TRILYTE) 420 g solution Take 4,000 mLs by mouth as directed. 03/17/22   Harvel Quale, MD  rizatriptan (MAXALT) 10 MG tablet Take 10 mg by mouth as needed for migraine. 01/29/22   [provider]  tiZANidine (ZANAFLEX) 2 MG tablet Take 2 mg by mouth 2 (two) times daily. 02/02/22   [provider]  topiramate (TOPAMAX) 25 MG tablet Take 1 tablet (25 mg total) by mouth 2 (two) times daily. 03/16/22 04/15/22  Massengill, Ovid Curd, MD  traZODone (DESYREL) 50 MG tablet Take 1 tablet (50 mg total) by mouth at bedtime. 03/16/22 04/15/22  Janine Limbo, MD      Allergies    Codeine, Methocarbamol, and Nsaids    Review of Systems  Review of Systems  All other systems reviewed and are negative.   Physical Exam Updated Vital Signs BP 110/73 (BP Location: Right Arm)   Pulse 88   Temp 97.7 F (36.5 C) (Oral)   Resp 17   Ht '5\' 6"'$  (1.676 m)   Wt 89.4 kg   SpO2 96%   BMI 31.80 kg/m  Physical Exam Vitals and nursing note reviewed.  Constitutional:      General: She is not in acute distress.    Appearance: She is well-developed. She is not diaphoretic.  HENT:     Head: Normocephalic and atraumatic.  Cardiovascular:     Rate and  Rhythm: Normal rate and regular rhythm.     Heart sounds: No murmur heard.    No friction rub. No gallop.  Pulmonary:     Effort: Pulmonary effort is normal. No respiratory distress.     Breath sounds: Normal breath sounds. No wheezing.  Abdominal:     General: Bowel sounds are normal. There is no distension.     Palpations: Abdomen is soft.     Tenderness: There is no abdominal tenderness.  Musculoskeletal:        General: Normal range of motion.     Cervical back: Normal range of motion and neck supple.     Right lower leg: Edema present.     Left lower leg: Edema present.     Comments: There is 1-2+ pitting edema of both lower extremities.  Both are tender to the touch.  DP pulses are palpable.  Skin:    General: Skin is warm and dry.  Neurological:     General: No focal deficit present.     Mental Status: She is alert and oriented to person, place, and time.     ED Results / Procedures / Treatments   Labs (all labs ordered are listed, but only abnormal results are displayed) Labs Reviewed  BRAIN NATRIURETIC PEPTIDE  BASIC METABOLIC PANEL  CBC WITH DIFFERENTIAL/PLATELET    EKG None  Radiology No results found.  Procedures Procedures  {Document cardiac monitor, telemetry assessment procedure when appropriate:1}  Medications Ordered in ED Medications  furosemide (LASIX) injection 40 mg (has no administration in time range)    ED Course/ Medical Decision Making/ A&P                           Medical Decision Making Amount and/or Complexity of Data Reviewed Labs: ordered.  Risk Prescription drug management.   ***  {Document critical care time when appropriate:1} {Document review of labs and clinical decision tools ie heart score, Chads2Vasc2 etc:1}  {Document your independent review of radiology images, and any outside records:1} {Document your discussion with family members, caretakers, and with consultants:1} {Document social determinants of health  affecting pt's care:1} {Document your decision making why or why not admission, treatments were needed:1} Final Clinical Impression(s) / ED Diagnoses Final diagnoses:  None    Rx / DC Orders ED Discharge Orders     None

## 2022-04-01 ENCOUNTER — Other Ambulatory Visit: Payer: Self-pay | Admitting: Physician Assistant

## 2022-04-01 ENCOUNTER — Emergency Department (HOSPITAL_COMMUNITY): Payer: Self-pay

## 2022-04-01 ENCOUNTER — Emergency Department (HOSPITAL_COMMUNITY)
Admission: EM | Admit: 2022-04-01 | Discharge: 2022-04-01 | Disposition: A | Discharge status: Home / Self Care | Payer: Self-pay | Attending: Emergency Medicine | Admitting: Emergency Medicine

## 2022-04-01 ENCOUNTER — Encounter (HOSPITAL_COMMUNITY): Payer: Self-pay

## 2022-04-01 ENCOUNTER — Other Ambulatory Visit: Payer: Self-pay

## 2022-04-01 DIAGNOSIS — I10 Essential (primary) hypertension: Secondary | ICD-10-CM | POA: Insufficient documentation

## 2022-04-01 DIAGNOSIS — M25552 Pain in left hip: Secondary | ICD-10-CM | POA: Insufficient documentation

## 2022-04-01 DIAGNOSIS — R600 Localized edema: Secondary | ICD-10-CM | POA: Insufficient documentation

## 2022-04-01 DIAGNOSIS — Z79899 Other long term (current) drug therapy: Secondary | ICD-10-CM | POA: Insufficient documentation

## 2022-04-01 DIAGNOSIS — M546 Pain in thoracic spine: Secondary | ICD-10-CM | POA: Insufficient documentation

## 2022-04-01 LAB — CBC WITH DIFFERENTIAL/PLATELET
Abs Immature Granulocytes: 0.02 10*3/uL (ref 0.00–0.07)
Basophils Absolute: 0 10*3/uL (ref 0.0–0.1)
Basophils Relative: 1 %
Eosinophils Absolute: 0.2 10*3/uL (ref 0.0–0.5)
Eosinophils Relative: 3 %
HCT: 32.2 % — ABNORMAL LOW (ref 36.0–46.0)
Hemoglobin: 10.5 g/dL — ABNORMAL LOW (ref 12.0–15.0)
Immature Granulocytes: 0 %
Lymphocytes Relative: 24 %
Lymphs Abs: 1.6 10*3/uL (ref 0.7–4.0)
MCH: 28.1 pg (ref 26.0–34.0)
MCHC: 32.6 g/dL (ref 30.0–36.0)
MCV: 86.1 fL (ref 80.0–100.0)
Monocytes Absolute: 0.7 10*3/uL (ref 0.1–1.0)
Monocytes Relative: 11 %
Neutro Abs: 3.9 10*3/uL (ref 1.7–7.7)
Neutrophils Relative %: 61 %
Platelets: 197 10*3/uL (ref 150–400)
RBC: 3.74 MIL/uL — ABNORMAL LOW (ref 3.87–5.11)
RDW: 12.4 % (ref 11.5–15.5)
WBC: 6.5 10*3/uL (ref 4.0–10.5)
nRBC: 0 % (ref 0.0–0.2)

## 2022-04-01 LAB — BASIC METABOLIC PANEL
Anion gap: 6 (ref 5–15)
BUN: 15 mg/dL (ref 6–20)
CO2: 28 mmol/L (ref 22–32)
Calcium: 9 mg/dL (ref 8.9–10.3)
Chloride: 103 mmol/L (ref 98–111)
Creatinine, Ser: 1.1 mg/dL — ABNORMAL HIGH (ref 0.44–1.00)
GFR, Estimated: >60 mL/min (ref >60)
Glucose, Bld: 114 mg/dL — ABNORMAL HIGH (ref 70–99)
Potassium: 3.6 mmol/L (ref 3.5–5.1)
Sodium: 137 mmol/L (ref 135–145)

## 2022-04-01 LAB — TROPONIN I (HIGH SENSITIVITY): Troponin I (High Sensitivity): 4 ng/L (ref <18)

## 2022-04-01 MED ORDER — FUROSEMIDE 20 MG PO TABS
20.0000 mg | ORAL_TABLET | Freq: Every day | ORAL | 0 refills | Status: DC
Start: 2022-04-01 — End: 2022-04-07

## 2022-04-01 MED ORDER — FUROSEMIDE 40 MG PO TABS
40.0000 mg | ORAL_TABLET | Freq: Once | ORAL | Status: AC
  Administered 2022-04-01: 40 mg via ORAL
  Filled 2022-04-01: qty 1

## 2022-04-01 MED ORDER — LORAZEPAM 1 MG PO TABS
1.0000 mg | ORAL_TABLET | Freq: Once | ORAL | Status: AC
  Administered 2022-04-01: 1 mg via ORAL
  Filled 2022-04-01: qty 1

## 2022-04-01 MED ORDER — OXYCODONE-ACETAMINOPHEN 5-325 MG PO TABS
1.0000 | ORAL_TABLET | Freq: Once | ORAL | Status: AC
  Administered 2022-04-01: 1 via ORAL
  Filled 2022-04-01: qty 1

## 2022-04-01 NOTE — ED Triage Notes (Signed)
Pt states she has been having swelling to bilateral feet since Saturday- was seen Sat night for fall. Pt states she is concerned about rash on her lower legs.

## 2022-04-01 NOTE — Discharge Instructions (Signed)
Please use Tylenol or ibuprofen for pain.  You may use 600 mg ibuprofen every 6 hours or 1000 mg of Tylenol every 6 hours.  You may choose to alternate between the 2.  This would be most effective.  Not to exceed 4 g of Tylenol within 24 hours.  Not to exceed 3200 mg ibuprofen 24 hours.  In addition to taking the fluid pills that were prescribed, I recommend you wear compression stockings on your legs. Follow up with your PCP for symptom recheck. Return to the ED if you have new or worsening chest pain, fever, shortness of breath.

## 2022-04-01 NOTE — ED Provider Notes (Signed)
[facility] EMERGENCY DEPARTMENT Provider Note   CSN: [id number] Arrival date & time: 04/01/22  1311     History  Chief Complaint  Patient presents with   Leg Pain    Briana Wyatt is a 51 y.o. female with a past medical history significant for cocaine induced mood disorder, anxiety, PTSD, stimulant use, congenital single kidney, hypertension, obesity who presents with concern for swelling to bilateral feet since Saturday after fall.  She also has some pain in the hip and back.  Patient reports that she is concerned about rash and pain in legs.  Patient was seen and evaluated by Dr. Stark Jock after the fall, had workup including chest x-ray, EKG, CBC, BMP, BNP which did not show any evidence of acute cardiac or kidney dysfunction at the time.  He diagnosed her with dependent edema.  Patient did not pick up her prescription for Lasix.  Patient is extremely anxious, difficult to direct, and has some inappropriate mood and reactions to history and physical exam.  She insist that she did not receive full evaluation when she was last in the emergency department but did not recall which date she was in the emergency department on my discussion with her. At this time she reports that she has been having some chest pain and shortness of breath since the fall as well.  No previous history of ACS.   Leg Pain Associated symptoms: back pain        Home Medications Prior to Admission medications   Medication Sig Start Date End Date Taking? Authorizing Provider  acetaminophen (TYLENOL) 650 MG CR tablet Take 650 mg by mouth every 8 (eight) hours as needed for pain or fever. 03/02/19   [provider]  albuterol (VENTOLIN HFA) 108 (90 Base) MCG/ACT inhaler Inhale 1-2 puffs into the lungs every 4 (four) hours as needed for wheezing or shortness of breath. 03/16/22   Massengill, Ovid Curd, MD  Calcium Carbonate-Vitamin D (CALTRATE 600+D PO) Take by mouth.    [provider]  cetirizine (ZYRTEC)  10 MG tablet Take 10 mg by mouth daily.    [provider]  Cholecalciferol (VITAMIN D-3 PO) Take 1 tablet by mouth daily.    [provider]  DULoxetine (CYMBALTA) 30 MG capsule Take 1 capsule (30 mg total) by mouth 2 (two) times daily. 03/16/22 04/15/22  Massengill, Ovid Curd, MD  Erenumab-aooe (AIMOVIG, 140 MG DOSE, [place]) One a month 02/18/22   [provider]  furosemide (LASIX) 20 MG tablet Take 1 tablet (20 mg total) by mouth daily. 04/01/22   Soyla Dryer, PA-C  gabapentin (NEURONTIN) 300 MG capsule Take 3 capsules (900 mg total) by mouth 2 (two) times daily. 03/16/22 04/15/22  Massengill, Ovid Curd, MD  hydrOXYzine (ATARAX) 25 MG tablet Take 1 tablet (25 mg total) by mouth 3 (three) times daily as needed for anxiety. 03/16/22 04/15/22  Massengill, Ovid Curd, MD  losartan (COZAAR) 50 MG tablet TAKE 1 Tablet BY MOUTH ONCE EVERY DAY 03/30/22   Soyla Dryer, PA-C  Multiple Vitamins-Minerals (CENTRUM SILVER PO) Take 1 tablet by mouth daily.    [provider]  omeprazole (PRILOSEC) 40 MG capsule TAKE 1 Capsule BY MOUTH ONCE EVERY DAY 03/30/22   Soyla Dryer, PA-C  phenylephrine-shark liver oil-mineral oil-petrolatum (PREPARATION H) 0.25-14-74.9 % rectal ointment Place 1 Application rectally 2 (two) times daily as needed for hemorrhoids. 03/16/22   Massengill, Ovid Curd, MD  polyethylene glycol-electrolytes (TRILYTE) 420 g solution Take 4,000 mLs by mouth as directed. 03/17/22   Montez Morita,  Quillian Quince, MD  rizatriptan (MAXALT) 10 MG tablet Take 10 mg by mouth as needed for migraine. 01/29/22   [provider]  tiZANidine (ZANAFLEX) 2 MG tablet Take 2 mg by mouth 2 (two) times daily. 02/02/22   [provider]  topiramate (TOPAMAX) 25 MG tablet Take 1 tablet (25 mg total) by mouth 2 (two) times daily. 03/16/22 04/15/22  Massengill, Ovid Curd, MD  traZODone (DESYREL) 50 MG tablet Take 1 tablet (50 mg total) by mouth at bedtime. 03/16/22 04/15/22  Massengill, Ovid Curd, MD       Allergies    Codeine, Methocarbamol, and Nsaids    Review of Systems   Review of Systems  Respiratory:  Positive for shortness of breath.   Cardiovascular:  Positive for chest pain and leg swelling.  Musculoskeletal:  Positive for back pain.  All other systems reviewed and are negative.   Physical Exam Updated Vital Signs BP 119/72 (BP Location: Left Arm)   Pulse 86   Temp 98.1 F (36.7 C) (Oral)   Resp 16   Ht '5\' 6"'$  (1.676 m)   Wt 96.8 kg   SpO2 99%   BMI 34.43 kg/m  Physical Exam Vitals and nursing note reviewed.  Constitutional:      General: She is not in acute distress.    Appearance: Normal appearance. She is obese.  HENT:     Head: Normocephalic and atraumatic.  Eyes:     General:        Right eye: No discharge.        Left eye: No discharge.  Cardiovascular:     Rate and Rhythm: Normal rate and regular rhythm.     Pulses: Normal pulses.     Heart sounds: No murmur heard.    No friction rub. No gallop.  Pulmonary:     Effort: Pulmonary effort is normal.     Breath sounds: Normal breath sounds.  Abdominal:     General: Bowel sounds are normal.     Palpations: Abdomen is soft.  Skin:    General: Skin is warm and dry.     Capillary Refill: Capillary refill takes less than 2 seconds.     Comments: Patient does have a healing bruise on the right posterior chest wall overlying scapula.  No step-off or deformity noted.  She has 2-3+ pitting edema bilateral lower extremities she has scattered excoriations throughout the seem consistent with itching excoriations versus bug bites versus other nonspecific skin eruption.  Neurological:     Mental Status: She is alert and oriented to person, place, and time.  Psychiatric:        Mood and Affect: Mood normal.        Behavior: Behavior normal.     Comments: Anxious, irritable, difficult to hold conversation as patient with rapid pressured speech.     ED Results / Procedures / Treatments   Labs (all labs  ordered are listed, but only abnormal results are displayed) Labs Reviewed  CBC WITH DIFFERENTIAL/PLATELET - Abnormal; Notable for the following components:      Result Value   RBC 3.74 (*)    Hemoglobin 10.5 (*)    HCT 32.2 (*)    All other components within normal limits  BASIC METABOLIC PANEL - Abnormal; Notable for the following components:   Glucose, Bld 114 (*)    Creatinine, Ser 1.10 (*)    All other components within normal limits  TROPONIN I (HIGH SENSITIVITY)    EKG EKG Interpretation  Date/Time:  Wednesday April 01 2022 14:10:20 EDT Ventricular Rate:  87 PR Interval:  158 QRS Duration: 106 QT Interval:  374 QTC Calculation: 450 R Axis:   11 Text Interpretation: Normal sinus rhythm Moderate voltage criteria for LVH, may be normal variant ( R in aVL , Cornell product ) When compared with ECG of 31-Mar-2022 00:28, PREVIOUS ECG IS PRESENT Confirmed by Octaviano Glow [phone]) on 04/01/2022 2:27:55 PM  Radiology DG Hip Unilat W or Wo Pelvis 2-3 Views Left  Result Date: 04/01/2022 CLINICAL DATA:  [place] 4 days ago with left hip pain and swelling EXAM: DG HIP (WITH OR WITHOUT PELVIS) 2-3V LEFT COMPARISON:  None Available. FINDINGS: No pelvic fracture.  No left hip fracture. IMPRESSION: No acute bone or joint finding. Electronically Signed   By: Nelson Chimes M.D.   On: 04/01/2022 14:03   DG Chest Port 1 View  Result Date: 03/31/2022 CLINICAL DATA:  Weakness, dyspnea EXAM: PORTABLE CHEST 1 VIEW COMPARISON:  09/07/2021 FINDINGS: Mild bibasilar atelectasis or scarring. The lungs are otherwise clear. No pneumothorax or pleural effusion. Cardiac size within normal limits. Pulmonary vascularity is normal. No acute bone abnormality. IMPRESSION: Mild bibasilar atelectasis or scarring. Electronically Signed   By: Fidela Salisbury M.D.   On: 03/31/2022 00:32    Procedures Procedures    Medications Ordered in ED Medications  furosemide (LASIX) tablet 40 mg (40 mg Oral Given 04/01/22 1403)   LORazepam (ATIVAN) tablet 1 mg (1 mg Oral Given 04/01/22 1444)  oxyCODONE-acetaminophen (PERCOCET/ROXICET) 5-325 MG per tablet 1 tablet (1 tablet Oral Given 04/01/22 1444)    ED Course/ Medical Decision Making/ A&P                           Medical Decision Making Amount and/or Complexity of Data Reviewed Labs: ordered. Radiology: ordered.  Risk Prescription drug management.   This is an overall well appearing 51 year old female who presents once again for evaluation of hip pain, body aches, lower extremity edema, and excoriations. She was seen and evaluated by Dr. Stark Jock around 48 hours ago. At that time, patient without cardiomegaly on CXR, normal BNP, kidney function. To me patient endorsed ongoing hip pain, and concern that legs have not improved. Patient has not picked up lasix that were prescribed at that time or followed up with PCP as was recommended. Patient denies new injury. She did endorse some chest pain ongoing since the time of the fall. My emergent differential diagnosis includes, ACS, AAS, PE, Mallory-Weiss, Boerhaave's, Pneumonia, acute bronchitis, asthma or COPD exacerbation, anxiety, MSK pain or traumatic injury to the chest, acid reflux versus other, kidney dysfunction, cellulitis, DVT bilaterally (less likely).  On physical exam patient with some TTP of left hip, with intact strength, ROM of bil lower extremities. No stepoffs or deformity throughout. Patient does have bruise overlying right scapula which is healing appropriately. No anterior chest wall tenderness noted. No arrhythmia, resp distress, or accessory breath sounds.  I independently interpreted labwork, the pertinent results include: BMP unremarkable, creatinine stable compared to previous lab values. Mild hyperglycemia, glucose 114. CBC with mild anemia, HGB of 10.5, not significantly changed from baseline. Negative troponin x 1 in context of ongoing chest pain since fall several days ago. No exertional chest pain,.    I independently interpreted EKG which shows NSR, moderate LVH. My attending Dr. Langston Masker confirmed this reading.  I independently interpreted imaging including plain film radiograph of left hip which shows no acute fracture,  dislocation or other pathology. I agree with the radiologist interpretation.  Clinically patient with appearance of dependent edema, no evidence of cardiac or renal pathology.  She has multiple excoriations from scratching what appear to be mosquito or other bug bites on the legs.  I do not appreciate any other rash.  She feels improved after some pain medication and anxiety medication.  I think that she is safe for discharge.  I encouraged her to follow-up with the PCP for further workup of her dependent edema as needed, in the meantime encourage fluid pills as well as compression stockings.  Patient discharged in stable condition at this time, extensive return precautions given.   Final Clinical Impression(s) / ED Diagnoses Final diagnoses:  Bilateral edema of lower extremity  Pain of left hip  Acute right-sided thoracic back pain    Rx / DC Orders ED Discharge Orders     None         Anselmo Pickler, PA-C 04/01/22 1637    Wyvonnia Dusky, MD 04/02/22 [phone]

## 2022-04-02 NOTE — Telephone Encounter (Signed)
Spoke with pt on 8/4 to figure out what medication she wanted a refill of, requesting a refill of tramadol. Let pt know provider is out of office but will send him a message for request.

## 2022-04-07 ENCOUNTER — Ambulatory Visit: Payer: Medicaid Other | Admitting: Orthopedic Surgery

## 2022-04-07 ENCOUNTER — Encounter: Payer: Self-pay | Admitting: Physician Assistant

## 2022-04-07 ENCOUNTER — Ambulatory Visit: Payer: Medicaid Other | Admitting: Physician Assistant

## 2022-04-07 VITALS — BP 125/81 | HR 83 | Temp 97.1°F | Ht 66.0 in | Wt 212.0 lb

## 2022-04-07 DIAGNOSIS — R609 Edema, unspecified: Secondary | ICD-10-CM

## 2022-04-07 DIAGNOSIS — L089 Local infection of the skin and subcutaneous tissue, unspecified: Secondary | ICD-10-CM

## 2022-04-07 MED ORDER — FUROSEMIDE 20 MG PO TABS: 20.0000 mg | ORAL_TABLET | Freq: Two times a day (BID) | ORAL | 0 refills | Status: DC

## 2022-04-07 MED ORDER — CEPHALEXIN 500 MG PO CAPS: 500.0000 mg | ORAL_CAPSULE | Freq: Four times a day (QID) | ORAL | 0 refills | Status: DC

## 2022-04-07 MED ORDER — POTASSIUM CHLORIDE CRYS ER 10 MEQ PO TBCR: 10.0000 meq | EXTENDED_RELEASE_TABLET | Freq: Every day | ORAL | 0 refills | Status: DC

## 2022-04-07 NOTE — Patient Instructions (Addendum)
Edema  Edema is an abnormal buildup of fluids in the body tissues and under the skin. Swelling of the legs, feet, and ankles is a common symptom that becomes more likely as you get older. Swelling is also common in looser tissues, such as around the eyes. Pressing on the area may make a temporary dent in your skin (pitting edema). This fluid may also accumulate in your lungs (pulmonary edema). There are many possible causes of edema. Eating too much salt (sodium) and being on your feet or sitting for a long time can cause edema in your legs, feet, and ankles. Common causes of edema include: Certain medical conditions, such as heart failure, liver or kidney disease, and cancer. Weak leg blood vessels. An injury. Pregnancy. Medicines. Being obese. Low protein levels in the blood. Hot weather may make edema worse. Edema is usually painless. Your skin may look swollen or shiny. Follow these instructions at home: Medicines Take over-the-counter and prescription medicines only as told by your health care provider. Your health care provider may prescribe a medicine to help your body get rid of extra water (diuretic). Take this medicine if you are told to take it. Eating and drinking Eat a low-salt (low-sodium) diet to reduce fluid as told by your health care provider. Sometimes, eating less salt may reduce swelling. Depending on the cause of your swelling, you may need to limit how much fluid you drink (fluid restriction). General instructions Raise (elevate) the injured area above the level of your heart while you are sitting or lying down. Do not sit still or stand for long periods of time. Do not wear tight clothing. Do not wear garters on your upper legs. Exercise your legs to get your circulation going. This helps to move the fluid back into your blood vessels, and it may help the swelling go down. Wear compression stockings as told by your health care provider. These stockings help to prevent  blood clots and reduce swelling in your legs. It is important that these are the correct size. These stockings should be prescribed by your health care provider to prevent possible injuries. If elastic bandages or wraps are recommended, use them as told by your health care provider. Contact a health care provider if: Your edema does not get better with treatment. You have heart, liver, or kidney disease and have symptoms of edema. You have sudden and unexplained weight gain. Get help right away if: You develop shortness of breath or chest pain. You cannot breathe when you lie down. You develop pain, redness, or warmth in the swollen areas. You have heart, liver, or kidney disease and suddenly get edema. You have a fever and your symptoms suddenly get worse. These symptoms may be an emergency. Get help right away. Call 911. Do not wait to see if the symptoms will go away. Do not drive yourself to the hospital. Summary Edema is an abnormal buildup of fluids in the body tissues and under the skin. Eating too much salt (sodium)and being on your feet or sitting for a long time can cause edema in your legs, feet, and ankles. Raise (elevate) the injured area above the level of your heart while you are sitting or lying down. Follow your health care provider's instructions about diet and how much fluid you can drink. This information is not intended to replace advice given to you by your health care provider. Make sure you discuss any questions you have with your health care provider. Document Revised: 04/14/2021 Document   Reviewed: 04/14/2021 Elsevier Patient Education  [place].   ---------------------------------------   Low-Sodium Eating Plan Sodium, which is an element that makes up salt, helps you maintain a healthy balance of fluids in your body. Too much sodium can increase your blood pressure and cause fluid and waste to be held in your body. Your health care provider or  dietitian may recommend following this plan if you have high blood pressure (hypertension), kidney disease, liver disease, or heart failure. Eating less sodium can help lower your blood pressure, reduce swelling, and protect your heart, liver, and kidneys. What are tips for following this plan? Reading food labels The Nutrition Facts label lists the amount of sodium in one serving of the food. If you eat more than one serving, you must multiply the listed amount of sodium by the number of servings. Choose foods with less than 140 mg of sodium per serving. Avoid foods with 300 mg of sodium or more per serving. Shopping  Look for lower-sodium products, often labeled as "low-sodium" or "no salt added." Always check the sodium content, even if foods are labeled as "unsalted" or "no salt added." Buy fresh foods. Avoid canned foods and pre-made or frozen meals. Avoid canned, cured, or processed meats. Buy breads that have less than 80 mg of sodium per slice. Cooking  Eat more home-cooked food and less restaurant, buffet, and fast food. Avoid adding salt when cooking. Use salt-free seasonings or herbs instead of table salt or sea salt. Check with your health care provider or pharmacist before using salt substitutes. Cook with plant-based oils, such as canola, sunflower, or olive oil. Meal planning When eating at a restaurant, ask that your food be prepared with less salt or no salt, if possible. Avoid dishes labeled as brined, pickled, cured, smoked, or made with soy sauce, miso, or teriyaki sauce. Avoid foods that contain MSG (monosodium glutamate). MSG is sometimes added to [place] food, bouillon, and some canned foods. Make meals that can be grilled, baked, poached, roasted, or steamed. These are generally made with less sodium. General information Most people on this plan should limit their sodium intake to 1,500-2,000 mg (milligrams) of sodium each day. What foods should I eat? Fruits Fresh,  frozen, or canned fruit. Fruit juice. Vegetables Fresh or frozen vegetables. "No salt added" canned vegetables. "No salt added" tomato sauce and paste. Low-sodium or reduced-sodium tomato and vegetable juice. Grains Low-sodium cereals, including oats, puffed wheat and rice, and shredded wheat. Low-sodium crackers. Unsalted rice. Unsalted pasta. Low-sodium bread. Whole-grain breads and whole-grain pasta. Meats and other proteins Fresh or frozen (no salt added) meat, poultry, seafood, and fish. Low-sodium canned tuna and salmon. Unsalted nuts. Dried peas, beans, and lentils without added salt. Unsalted canned beans. Eggs. Unsalted nut butters. Dairy Milk. Soy milk. Cheese that is naturally low in sodium, such as ricotta cheese, fresh mozzarella, or Swiss cheese. Low-sodium or reduced-sodium cheese. Cream cheese. Yogurt. Seasonings and condiments Fresh and dried herbs and spices. Salt-free seasonings. Low-sodium mustard and ketchup. Sodium-free salad dressing. Sodium-free light mayonnaise. Fresh or refrigerated horseradish. Lemon juice. Vinegar. Other foods Homemade, reduced-sodium, or low-sodium soups. Unsalted popcorn and pretzels. Low-salt or salt-free chips. The items listed above may not be a complete list of foods and beverages you can eat. Contact a dietitian for more information. What foods should I avoid? Vegetables Sauerkraut, pickled vegetables, and relishes. Olives. [place] fries. Onion rings. Regular canned vegetables (not low-sodium or reduced-sodium). Regular canned tomato sauce and paste (not low-sodium or reduced-sodium).  Regular tomato and vegetable juice (not low-sodium or reduced-sodium). Frozen vegetables in sauces. Grains Instant hot cereals. Bread stuffing, pancake, and biscuit mixes. Croutons. Seasoned rice or pasta mixes. Noodle soup cups. Boxed or frozen macaroni and cheese. Regular salted crackers. Self-rising flour. Meats and other proteins Meat or fish that is salted,  canned, smoked, spiced, or pickled. Precooked or cured meat, such as sausages or meat loaves. Berniece Salines. Ham. Pepperoni. Hot dogs. Corned beef. Chipped beef. Salt pork. Jerky. Pickled herring. Anchovies and sardines. Regular canned tuna. Salted nuts. Dairy Processed cheese and cheese spreads. Hard cheeses. Cheese curds. Blue cheese. Feta cheese. String cheese. Regular cottage cheese. Buttermilk. Canned milk. Fats and oils Salted butter. Regular margarine. Ghee. Bacon fat. Seasonings and condiments Onion salt, garlic salt, seasoned salt, table salt, and sea salt. Canned and packaged gravies. Worcestershire sauce. Tartar sauce. Barbecue sauce. Teriyaki sauce. Soy sauce, including reduced-sodium. Steak sauce. Fish sauce. Oyster sauce. Cocktail sauce. Horseradish that you find on the shelf. Regular ketchup and mustard. Meat flavorings and tenderizers. Bouillon cubes. Hot sauce. Pre-made or packaged marinades. Pre-made or packaged taco seasonings. Relishes. Regular salad dressings. Salsa. Other foods Salted popcorn and pretzels. Corn chips and puffs. Potato and tortilla chips. Canned or dried soups. Pizza. Frozen entrees and pot pies. The items listed above may not be a complete list of foods and beverages you should avoid. Contact a dietitian for more information. Summary Eating less sodium can help lower your blood pressure, reduce swelling, and protect your heart, liver, and kidneys. Most people on this plan should limit their sodium intake to 1,500-2,000 mg (milligrams) of sodium each day. Canned, boxed, and frozen foods are high in sodium. Restaurant foods, fast foods, and pizza are also very high in sodium. You also get sodium by adding salt to food. Try to cook at home, eat more fresh fruits and vegetables, and eat less fast food and canned, processed, or prepared foods. This information is not intended to replace advice given to you by your health care provider. Make sure you discuss any questions you  have with your health care provider. Document Revised: 09/15/2019 Document Reviewed: 07/12/2019 Elsevier Patient Education  [place].

## 2022-04-07 NOTE — Progress Notes (Signed)
BP 125/81   Pulse 83   Temp (!) 97.1 F (36.2 C)   Ht '5\' 6"'$  (1.676 m)   Wt 212 lb (96.2 kg)   SpO2 94%   BMI 34.22 kg/m    Subjective:    Patient ID: Briana Wyatt, female    DOB: 06-30-1971, 51 y.o.   MRN: [id number]  HPI: Briana Wyatt is a 51 y.o. female presenting on 04/07/2022 for Blister (On bilateral anterior LE with edema)   HPI   Chief Complaint  Patient presents with   Blister    On bilateral anterior LE with edema    Pt has been pouring peroxide on her blisters.      Relevant past medical, surgical, family and social history reviewed and updated as indicated. Interim medical history since our last visit reviewed. Allergies and medications reviewed and updated.    Current Outpatient Medications:    acetaminophen (TYLENOL) 650 MG CR tablet, Take 650 mg by mouth every 8 (eight) hours as needed for pain or fever., Disp: , Rfl:    albuterol (VENTOLIN HFA) 108 (90 Base) MCG/ACT inhaler, Inhale 1-2 puffs into the lungs every 4 (four) hours as needed for wheezing or shortness of breath., Disp: , Rfl:    Calcium Carbonate-Vitamin D (CALTRATE 600+D PO), Take by mouth., Disp: , Rfl:    cetirizine (ZYRTEC) 10 MG tablet, Take 10 mg by mouth daily., Disp: , Rfl:    Cholecalciferol (VITAMIN D-3 PO), Take 1 tablet by mouth daily., Disp: , Rfl:    DULoxetine (CYMBALTA) 30 MG capsule, Take 1 capsule (30 mg total) by mouth 2 (two) times daily., Disp: 60 capsule, Rfl: 0   Erenumab-aooe (AIMOVIG, 140 MG DOSE, [place]), One a month, Disp: , Rfl:    furosemide (LASIX) 20 MG tablet, Take 1 tablet (20 mg total) by mouth daily., Disp: 14 tablet, Rfl: 0   gabapentin (NEURONTIN) 300 MG capsule, Take 3 capsules (900 mg total) by mouth 2 (two) times daily., Disp: 180 capsule, Rfl: 0   hydrOXYzine (ATARAX) 25 MG tablet, Take 1 tablet (25 mg total) by mouth 3 (three) times daily as needed for anxiety., Disp: 30 tablet, Rfl: 0   losartan (COZAAR) 50 MG tablet, TAKE 1 Tablet BY MOUTH ONCE  EVERY DAY, Disp: 90 tablet, Rfl: 1   Multiple Vitamins-Minerals (CENTRUM SILVER PO), Take 1 tablet by mouth daily., Disp: , Rfl:    omeprazole (PRILOSEC) 40 MG capsule, TAKE 1 Capsule BY MOUTH ONCE EVERY DAY, Disp: 90 capsule, Rfl: 1   phenylephrine-shark liver oil-mineral oil-petrolatum (PREPARATION H) 0.25-14-74.9 % rectal ointment, Place 1 Application rectally 2 (two) times daily as needed for hemorrhoids., Disp: , Rfl:    rizatriptan (MAXALT) 10 MG tablet, Take 10 mg by mouth as needed for migraine., Disp: , Rfl:    topiramate (TOPAMAX) 25 MG tablet, Take 1 tablet (25 mg total) by mouth 2 (two) times daily., Disp: 60 tablet, Rfl: 0   traZODone (DESYREL) 50 MG tablet, Take 1 tablet (50 mg total) by mouth at bedtime., Disp: 30 tablet, Rfl: 0   polyethylene glycol-electrolytes (TRILYTE) 420 g solution, Take 4,000 mLs by mouth as directed. (Patient not taking: Reported on 04/07/2022), Disp: 4000 mL, Rfl: 0   tiZANidine (ZANAFLEX) 2 MG tablet, Take 2 mg by mouth 2 (two) times daily. (Patient not taking: Reported on 04/07/2022), Disp: , Rfl:     Review of Systems  Per HPI unless specifically indicated above     Objective:    BP 125/81  Pulse 83   Temp (!) 97.1 F (36.2 C)   Ht '5\' 6"'$  (1.676 m)   Wt 212 lb (96.2 kg)   SpO2 94%   BMI 34.22 kg/m   Wt Readings from Last 3 Encounters:  04/07/22 212 lb (96.2 kg)  04/01/22 213 lb 4.8 oz (96.8 kg)  03/30/22 197 lb (89.4 kg)      Wt 03/10/22    197 lb (this office)       02/10/22   203 lb   Physical Exam Constitutional:      General: She is not in acute distress.    Appearance: She is not toxic-appearing.  HENT:     Head: Normocephalic and atraumatic.  Pulmonary:     Effort: Pulmonary effort is normal. No respiratory distress.  Musculoskeletal:     Right lower leg: Edema present.     Left lower leg: Edema present.     Comments: 'dp pulses palpable bilaterally.  Wounds BLE as pictured.  Some excoriation.    Neurological:      Mental Status: She is alert and oriented to person, place, and time.  Psychiatric:        Behavior: Behavior normal.                 Assessment & Plan:    Encounter Diagnoses  Name Primary?   Infected wound Yes   Edema, unspecified type      -uncertain etiology of wounds.  Possible insect bites -rx keflex -Increase lasix.  Add K+ -pt counseled to avoid H2O2.   Counseled to use soap and water daily to wash.  She can apply ointment (like bacitracin) if desired.  Urged to avoid scratching -pt encouraged to follow low sodium diet and she is given reading information -pt encouraged to elevate the legs when seated and go for walks several times daily -pt has f/u appt 9/7.   She is to RTO sooner for worsening or other problems

## 2022-04-08 ENCOUNTER — Encounter: Payer: Self-pay | Admitting: Physician Assistant

## 2022-04-09 NOTE — Telephone Encounter (Signed)
Pt was called at 7:37am.  No answer.  Left VM for pt to call back.

## 2022-04-09 NOTE — Telephone Encounter (Signed)
2nd attempt to contact pt returning VM she left overnight.   No answer.  Left VM for her to call during business hours to speak with someone.

## 2022-04-09 NOTE — Telephone Encounter (Signed)
Pt has an appointment, will discuss medications then.

## 2022-04-09 NOTE — Telephone Encounter (Signed)
Pt returned call.  Discussed that if she was unhappy with her care at [facility] that [facility] would discuss options for transfer to other facility if that is what she wanted.   Offered her th phone number but she says she has it.   Discussed that her edema is likely due to PVD.  Her most recent Cr on 04/01/22 was 1.1 which is CKD stage 2 and reminded her that we discussed balancing lasix for the legs with her diminished renal function.   She says she is planning to get some compression hose.   She has follow up on 04/29/22.

## 2022-04-12 ENCOUNTER — Other Ambulatory Visit: Payer: Self-pay | Admitting: Physician Assistant

## 2022-04-14 ENCOUNTER — Ambulatory Visit (INDEPENDENT_AMBULATORY_CARE_PROVIDER_SITE_OTHER): Payer: Self-pay | Admitting: Orthopedic Surgery

## 2022-04-14 ENCOUNTER — Ambulatory Visit (INDEPENDENT_AMBULATORY_CARE_PROVIDER_SITE_OTHER): Payer: Self-pay

## 2022-04-14 ENCOUNTER — Encounter: Payer: Self-pay | Admitting: Orthopedic Surgery

## 2022-04-14 VITALS — BP 146/98 | HR 95 | Ht 66.0 in | Wt 198.0 lb

## 2022-04-14 DIAGNOSIS — M545 Low back pain, unspecified: Secondary | ICD-10-CM

## 2022-04-14 DIAGNOSIS — M7061 Trochanteric bursitis, right hip: Secondary | ICD-10-CM

## 2022-04-14 MED ORDER — METHYLPREDNISOLONE ACETATE 40 MG/ML IJ SUSP
40.0000 mg | Freq: Once | INTRAMUSCULAR | Status: AC
  Administered 2022-04-14: 40 mg via INTRA_ARTICULAR

## 2022-04-14 MED ORDER — PREDNISONE 10 MG (21) PO TBPK: ORAL_TABLET | ORAL | 0 refills | Status: DC

## 2022-04-14 NOTE — Patient Instructions (Signed)

## 2022-04-15 ENCOUNTER — Telehealth: Payer: Self-pay

## 2022-04-15 ENCOUNTER — Other Ambulatory Visit: Payer: Self-pay | Admitting: Physician Assistant

## 2022-04-15 DIAGNOSIS — D649 Anemia, unspecified: Secondary | ICD-10-CM

## 2022-04-15 DIAGNOSIS — R609 Edema, unspecified: Secondary | ICD-10-CM

## 2022-04-15 DIAGNOSIS — I10 Essential (primary) hypertension: Secondary | ICD-10-CM

## 2022-04-15 NOTE — Telephone Encounter (Signed)
Patient left message on voicemail asking if Dr. Amedeo Kinsman could redo her prescription, she had it sent to [facility] and it will cost her $45.  She would like to have it sent to [facility] instead. She would like a call to let her know when this is taken care of. [phone].

## 2022-04-15 NOTE — Progress Notes (Signed)
Orthopaedic Clinic Return  Assessment: Briana Wyatt is a 51 y.o. female with the following: Lumbar back pain, with radiculopathy Right greater trochanteric bursitis  Plan: [name] has some acute onset lower back pain, with radiating pains into her legs.  I provided her with a Dosepak of prednisone.  I have also encouraged her to discuss the symptoms with the neurosurgeon that she is expected to see in the next couple of weeks.  She also has severe tenderness to palpation over the right greater trochanter.  As result, she is interested in an injection.  This was completed in clinic today.  Follow-up as needed.   Procedure note injection - Right lateral hip   Verbal consent was obtained to inject the Right lateral hip.  Patient localized the pain. Timeout was completed to confirm the site of injection.  The skin was prepped with alcohol and ethyl chloride was sprayed at the injection site.  A 21-gauge needle was used to inject 40 mg of Depo-Medrol and 1% lidocaine (4 cc) into the Right lateral hip, directly over the localized tenderness using a direct lateral approach.  There were no complications. A sterile bandage was applied.    Follow-up: Return if symptoms worsen or fail to improve.   Subjective:  Chief Complaint  Patient presents with   Hip Pain    States right hip / "labral tear" but points to her back is painful has fallen twice     History of Present Illness: Briana Wyatt is a 51 y.o. female who returns to clinic for evaluation of low back pain and right hip pain.  She reports acute onset of pain in the lower back, with radiating pains in the both of her legs.  This is been ongoing for couple weeks.  She also has pain over the lateral hip.  The pain is severe.  It is affecting her mobility.  No prior treatments.  She is scheduled to see a neurosurgeon about her neck.   Review of Systems: No fevers or chills + numbness & tingling No chest pain No  shortness of breath No bowel or bladder dysfunction No GI distress No headaches   Objective: BP (!) 146/98   Pulse 95   Ht '5\' 6"'$  (1.676 m)   Wt 198 lb (89.8 kg)   BMI 31.96 kg/m   Physical Exam:  Alert and oriented.  No acute distress.  Right-sided antalgic gait.  Tenderness palpation along the lower back.  Positive straight leg raise on the right.  She has tenderness to palpation over the greater trochanter on the right.  Toes warm and well-perfused.   IMAGING: I personally ordered and reviewed the following images:  X-rays of the lumbar spine were obtained in clinic today.  No acute injuries are noted.  Well-maintained disc height.  There are some small osteophytes noted on the anterior aspects of multiple vertebral bodies.  No evidence of anterolisthesis.  Impression: Lumbar spine x-rays with mild degenerative changes.  Mordecai Rasmussen, MD 04/15/2022 10:41 AM

## 2022-04-16 MED ORDER — PREDNISONE 10 MG (21) PO TBPK: ORAL_TABLET | ORAL | 0 refills | Status: DC

## 2022-04-21 ENCOUNTER — Encounter: Payer: Self-pay | Admitting: Physician Assistant

## 2022-04-23 ENCOUNTER — Encounter: Payer: Self-pay | Admitting: General Surgery

## 2022-04-23 ENCOUNTER — Ambulatory Visit (INDEPENDENT_AMBULATORY_CARE_PROVIDER_SITE_OTHER): Payer: Self-pay | Admitting: General Surgery

## 2022-04-23 VITALS — BP 159/100 | HR 68 | Temp 98.0°F | Resp 16 | Ht 66.0 in | Wt 198.0 lb

## 2022-04-23 DIAGNOSIS — D179 Benign lipomatous neoplasm, unspecified: Secondary | ICD-10-CM

## 2022-04-23 NOTE — Progress Notes (Signed)
Briana Wyatt; [id number]; June 25, 1971   HPI Patient is a 51 year old white female who was referred to my care by Soyla Dryer for evaluation and treatment of tender nodules on her rib cage.  These are just below the inframammary fold.  She states that she first noticed the tenderness in the lumpiness while in the shower several weeks ago.  She has had a lipoma removed from her chest wall in the past.  She does wear an underwire bra on occasion.  She has not noticed any change in size.  She does have multiple issues for which she is seeing multiple physicians.  She denies any trauma to this region. 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     albuterol (VENTOLIN HFA) 108 (90 Base) MCG/ACT inhaler Inhale 1-2 puffs into the lungs every 4 (four) hours as needed for wheezing or shortness of breath.     Calcium Carbonate-Vitamin D (CALTRATE 600+D PO) Take by mouth.     cetirizine (ZYRTEC) 10 MG tablet Take 10 mg by mouth daily.     Cholecalciferol (VITAMIN D-3 PO) Take 1 tablet by mouth daily.     Erenumab-aooe (AIMOVIG, 140 MG DOSE, [place]) One a month     furosemide (LASIX) 20 MG tablet TAKE 1 TABLET(20 MG) BY MOUTH DAILY 14 tablet 0   losartan (COZAAR) 50 MG tablet TAKE 1 Tablet BY MOUTH ONCE EVERY DAY 90 tablet 1   Multiple Vitamins-Minerals (CENTRUM SILVER PO) Take 1  tablet by mouth daily.     omeprazole (PRILOSEC) 40 MG capsule TAKE 1 Capsule BY MOUTH ONCE EVERY DAY 90 capsule 1   phenylephrine-shark liver oil-mineral oil-petrolatum (PREPARATION H) 0.25-14-74.9 % rectal ointment Place 1 Application rectally 2 (two) times daily as needed for hemorrhoids.     polyethylene glycol-electrolytes (TRILYTE) 420 g solution Take 4,000 mLs by mouth as directed. 4000 mL 0   potassium chloride (KLOR-CON M) 10 MEQ tablet Take 1 tablet (10 mEq total) by mouth daily. 90 tablet 0   predniSONE (STERAPRED UNI-PAK 21 TAB) 10 MG (21) TBPK tablet 10 mg DS 12 as directed 48 tablet 0   rizatriptan (MAXALT) 10 MG tablet Take 10 mg by mouth as needed for migraine.     tiZANidine (ZANAFLEX) 2 MG tablet Take 2 mg by mouth 2 (two) times daily.     DULoxetine (CYMBALTA) 30 MG capsule Take 1 capsule (30 mg total) by mouth 2 (two) times daily. 60 capsule 0   gabapentin (NEURONTIN) 300 MG capsule Take 3 capsules (900 mg total) by mouth 2 (two) times daily. 180 capsule 0   topiramate (TOPAMAX) 25 MG tablet Take 1 tablet (25 mg total) by mouth 2 (two) times daily. 60 tablet 0   traZODone (DESYREL)  50 MG tablet Take 1 tablet (50 mg total) by mouth at bedtime. 30 tablet 0   No current facility-administered medications on file prior to visit.    Allergies  Allergen Reactions   Codeine Nausea And Vomiting    High doses of codeine pt reports greater than '30mg'$  she can't tolerate    Methocarbamol Itching    "feels like bugs are crawling under skin"   Nsaids     Other reaction(s): Contraindicated CKD, only one kidney Other reaction(s): Contraindicated CKD, only one kidney    Social History   Substance and Sexual Activity  Alcohol Use No    Social History   Tobacco Use  Smoking Status Former   Types: Cigarettes   Quit date: 08/25/1995   Years since quitting: 26.6   Passive exposure: Past  Smokeless Tobacco Never    Review of Systems  Reason unable to perform ROS: Patient did  not fill out sheet, pertinent ROS on record.    Objective   Vitals:   04/23/22 1128  BP: (!) 159/100  Pulse: 68  Resp: 16  Temp: 98 F (36.7 C)  SpO2: 95%    Physical Exam Vitals reviewed.  Constitutional:      Appearance: Normal appearance. She is not ill-appearing.  HENT:     Head: Normocephalic and atraumatic.  Cardiovascular:     Rate and Rhythm: Normal rate and regular rhythm.     Heart sounds: Normal heart sounds. No murmur heard.    No friction rub. No gallop.  Pulmonary:     Effort: Pulmonary effort is normal. No respiratory distress.     Breath sounds: Normal breath sounds. No stridor. No wheezing, rhonchi or rales.  Skin:    General: Skin is warm and dry.     Comments: Small variant oval subcutaneous mobile soft tissue along the inframammary folds bilaterally.  No thoracic wall rib abnormality.  Neurological:     Mental Status: She is alert and oriented to person, place, and time.     Assessment  Benign soft tissue nodules of chest wall at inframammary folds probably secondary to underwire bra use, early lipomas Plan  I told the patient that she should stop using underwire bras to reduce the trauma to the soft tissue below the breasts.  She understands and agrees.  No indication for surgical excision at the present time.  Follow-up as needed.

## 2022-04-24 ENCOUNTER — Other Ambulatory Visit: Payer: Self-pay | Admitting: Physician Assistant

## 2022-04-26 ENCOUNTER — Other Ambulatory Visit: Payer: Self-pay | Admitting: Physician Assistant

## 2022-04-28 ENCOUNTER — Other Ambulatory Visit (HOSPITAL_COMMUNITY)
Admission: RE | Admit: 2022-04-28 | Discharge: 2022-04-28 | Disposition: A | Discharge status: Home / Self Care | Payer: Self-pay | Source: Ambulatory Visit | Attending: Physician Assistant | Admitting: Physician Assistant

## 2022-04-28 DIAGNOSIS — I10 Essential (primary) hypertension: Secondary | ICD-10-CM | POA: Insufficient documentation

## 2022-04-28 DIAGNOSIS — R609 Edema, unspecified: Secondary | ICD-10-CM | POA: Insufficient documentation

## 2022-04-28 DIAGNOSIS — D649 Anemia, unspecified: Secondary | ICD-10-CM | POA: Insufficient documentation

## 2022-04-28 LAB — BASIC METABOLIC PANEL
Anion gap: 7 (ref 5–15)
BUN: 31 mg/dL — ABNORMAL HIGH (ref 6–20)
CO2: 25 mmol/L (ref 22–32)
Calcium: 8.7 mg/dL — ABNORMAL LOW (ref 8.9–10.3)
Chloride: 105 mmol/L (ref 98–111)
Creatinine, Ser: 1 mg/dL (ref 0.44–1.00)
GFR, Estimated: >60 mL/min (ref >60)
Glucose, Bld: 111 mg/dL — ABNORMAL HIGH (ref 70–99)
Potassium: 4.5 mmol/L (ref 3.5–5.1)
Sodium: 137 mmol/L (ref 135–145)

## 2022-04-28 LAB — CBC
HCT: 38.8 % (ref 36.0–46.0)
Hemoglobin: 12.8 g/dL (ref 12.0–15.0)
MCH: 28.3 pg (ref 26.0–34.0)
MCHC: 33 g/dL (ref 30.0–36.0)
MCV: 85.8 fL (ref 80.0–100.0)
Platelets: 280 10*3/uL (ref 150–400)
RBC: 4.52 MIL/uL (ref 3.87–5.11)
RDW: 12.8 % (ref 11.5–15.5)
WBC: 12 10*3/uL — ABNORMAL HIGH (ref 4.0–10.5)
nRBC: 0 % (ref 0.0–0.2)

## 2022-04-29 ENCOUNTER — Encounter (HOSPITAL_COMMUNITY)
Admission: RE | Disposition: A | Discharge status: Home / Self Care | Payer: Self-pay | Source: Home / Self Care | Attending: Gastroenterology

## 2022-04-29 ENCOUNTER — Ambulatory Visit (HOSPITAL_COMMUNITY): Payer: Self-pay | Admitting: Certified Registered Nurse Anesthetist

## 2022-04-29 ENCOUNTER — Encounter (INDEPENDENT_AMBULATORY_CARE_PROVIDER_SITE_OTHER): Payer: Self-pay | Admitting: *Deleted

## 2022-04-29 ENCOUNTER — Other Ambulatory Visit: Payer: Self-pay

## 2022-04-29 ENCOUNTER — Ambulatory Visit: Payer: Medicaid Other | Admitting: Physician Assistant

## 2022-04-29 ENCOUNTER — Encounter (HOSPITAL_COMMUNITY): Payer: Self-pay | Admitting: Gastroenterology

## 2022-04-29 ENCOUNTER — Ambulatory Visit (HOSPITAL_COMMUNITY)
Admission: RE | Admit: 2022-04-29 | Discharge: 2022-04-29 | Disposition: A | Discharge status: Home / Self Care | Payer: Self-pay | Attending: Gastroenterology | Admitting: Gastroenterology

## 2022-04-29 ENCOUNTER — Ambulatory Visit (HOSPITAL_BASED_OUTPATIENT_CLINIC_OR_DEPARTMENT_OTHER): Payer: Self-pay | Admitting: Certified Registered Nurse Anesthetist

## 2022-04-29 ENCOUNTER — Other Ambulatory Visit (INDEPENDENT_AMBULATORY_CARE_PROVIDER_SITE_OTHER): Payer: Self-pay

## 2022-04-29 DIAGNOSIS — F32A Depression, unspecified: Secondary | ICD-10-CM | POA: Insufficient documentation

## 2022-04-29 DIAGNOSIS — K644 Residual hemorrhoidal skin tags: Secondary | ICD-10-CM | POA: Insufficient documentation

## 2022-04-29 DIAGNOSIS — I10 Essential (primary) hypertension: Secondary | ICD-10-CM | POA: Insufficient documentation

## 2022-04-29 DIAGNOSIS — K648 Other hemorrhoids: Secondary | ICD-10-CM | POA: Insufficient documentation

## 2022-04-29 DIAGNOSIS — R110 Nausea: Secondary | ICD-10-CM

## 2022-04-29 DIAGNOSIS — K3189 Other diseases of stomach and duodenum: Secondary | ICD-10-CM | POA: Insufficient documentation

## 2022-04-29 DIAGNOSIS — K449 Diaphragmatic hernia without obstruction or gangrene: Secondary | ICD-10-CM

## 2022-04-29 DIAGNOSIS — Z905 Acquired absence of kidney: Secondary | ICD-10-CM | POA: Insufficient documentation

## 2022-04-29 DIAGNOSIS — K219 Gastro-esophageal reflux disease without esophagitis: Secondary | ICD-10-CM | POA: Insufficient documentation

## 2022-04-29 DIAGNOSIS — Z7722 Contact with and (suspected) exposure to environmental tobacco smoke (acute) (chronic): Secondary | ICD-10-CM | POA: Insufficient documentation

## 2022-04-29 DIAGNOSIS — R1013 Epigastric pain: Secondary | ICD-10-CM

## 2022-04-29 DIAGNOSIS — Z87891 Personal history of nicotine dependence: Secondary | ICD-10-CM | POA: Insufficient documentation

## 2022-04-29 DIAGNOSIS — K635 Polyp of colon: Secondary | ICD-10-CM | POA: Insufficient documentation

## 2022-04-29 DIAGNOSIS — K31A19 Gastric intestinal metaplasia without dysplasia, unspecified site: Secondary | ICD-10-CM | POA: Insufficient documentation

## 2022-04-29 DIAGNOSIS — K2270 Barrett's esophagus without dysplasia: Secondary | ICD-10-CM

## 2022-04-29 DIAGNOSIS — F419 Anxiety disorder, unspecified: Secondary | ICD-10-CM | POA: Insufficient documentation

## 2022-04-29 DIAGNOSIS — R112 Nausea with vomiting, unspecified: Secondary | ICD-10-CM

## 2022-04-29 DIAGNOSIS — G2581 Restless legs syndrome: Secondary | ICD-10-CM | POA: Insufficient documentation

## 2022-04-29 DIAGNOSIS — Q438 Other specified congenital malformations of intestine: Secondary | ICD-10-CM | POA: Insufficient documentation

## 2022-04-29 DIAGNOSIS — Z1211 Encounter for screening for malignant neoplasm of colon: Secondary | ICD-10-CM

## 2022-04-29 DIAGNOSIS — R109 Unspecified abdominal pain: Secondary | ICD-10-CM

## 2022-04-29 HISTORY — PX: ESOPHAGOGASTRODUODENOSCOPY (EGD) WITH PROPOFOL: SHX5813

## 2022-04-29 HISTORY — PX: COLONOSCOPY WITH PROPOFOL: SHX5780

## 2022-04-29 HISTORY — PX: BIOPSY: SHX5522

## 2022-04-29 LAB — HM COLONOSCOPY

## 2022-04-29 SURGERY — COLONOSCOPY WITH PROPOFOL
Anesthesia: General

## 2022-04-29 MED ORDER — PROPOFOL 500 MG/50ML IV EMUL
INTRAVENOUS | Status: DC | PRN
  Administered 2022-04-29: 150 ug/kg/min via INTRAVENOUS

## 2022-04-29 MED ORDER — LACTATED RINGERS IV SOLN: INTRAVENOUS | Status: DC

## 2022-04-29 MED ORDER — LIDOCAINE HCL (CARDIAC) PF 100 MG/5ML IV SOSY
PREFILLED_SYRINGE | INTRAVENOUS | Status: DC | PRN
  Administered 2022-04-29: 50 mg via INTRAVENOUS
  Administered 2022-04-29: 60 mg via INTRAVENOUS

## 2022-04-29 MED ORDER — PROPOFOL 10 MG/ML IV BOLUS
INTRAVENOUS | Status: DC | PRN
  Administered 2022-04-29: 50 mg via INTRAVENOUS
  Administered 2022-04-29: 100 mg via INTRAVENOUS

## 2022-04-29 NOTE — Anesthesia Preprocedure Evaluation (Addendum)
Anesthesia Evaluation  Patient identified by MRN, date of birth, ID band Patient awake    Reviewed: Allergy & Precautions, NPO status , Patient's Chart, lab work & pertinent test results  Airway Mallampati: II  TM Distance: >3 FB Neck ROM: Full    Dental  (+) Dental Advisory Given, Missing, Chipped   Pulmonary neg pulmonary ROS, former smoker,    Pulmonary exam normal breath sounds clear to auscultation       Cardiovascular Exercise Tolerance: Poor hypertension, Pt. on medications negative cardio ROS Normal cardiovascular exam Rhythm:Regular Rate:Normal     Neuro/Psych PSYCHIATRIC DISORDERS Anxiety Depression  Neuromuscular disease (RLS)    GI/Hepatic Neg liver ROS, GERD  Medicated and Controlled,  Endo/Other  negative endocrine ROS  Renal/GU Renal InsufficiencyRenal disease (only one functioning kidney)  negative genitourinary   Musculoskeletal negative musculoskeletal ROS (+)   Abdominal   Peds negative pediatric ROS (+)  Hematology negative hematology ROS (+)   Anesthesia Other Findings   Reproductive/Obstetrics negative OB ROS                            Anesthesia Physical Anesthesia Plan  ASA: 2  Anesthesia Plan: General   Post-op Pain Management: Minimal or no pain anticipated   Induction: Intravenous  PONV Risk Score and Plan: Propofol infusion  Airway Management Planned: Nasal Cannula and Natural Airway  Additional Equipment:   Intra-op Plan:   Post-operative Plan:   Informed Consent: I have reviewed the patients History and Physical, chart, labs and discussed the procedure including the risks, benefits and alternatives for the proposed anesthesia with the patient or authorized representative who has indicated his/her understanding and acceptance.     Dental advisory given  Plan Discussed with: CRNA and Surgeon  Anesthesia Plan Comments:          Anesthesia Quick Evaluation

## 2022-04-29 NOTE — H&P (Signed)
Briana Wyatt is an 51 y.o. female.   Chief Complaint: abdominal pain, CRC screening HPI: Briana Wyatt is a 51 y.o. female with past medical history of acid reflux, craniofacial hyperhidrosis, HTN, RLS, coming for evaluation of chronic abdominal pain  Patient reports that she has presented for the last 2 years abdominal pain diffusely but worse in the lower abdomen.  States that the pain is present almost on a daily basis.  Never had a colonoscopy in the past.  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 Her smoking use included cigarettes. She has been exposed to tobacco smoke. She has never used smokeless tobacco. She reports current drug use. Drug: "Crack" cocaine. She reports that she does not drink alcohol.  Allergies:  Allergies  Allergen Reactions   Codeine Nausea And Vomiting    High doses of codeine pt reports greater than '30mg'$  she can't tolerate    Methocarbamol Itching    "feels like bugs are crawling under skin"   Nsaids     Other reaction(s): Contraindicated CKD, only one kidney Other reaction(s): Contraindicated CKD, only one kidney    Medications Prior to Admission  Medication Sig Dispense Refill   acetaminophen (TYLENOL) 650 MG CR tablet Take 650 mg by mouth every 8 (eight) hours as needed for pain or fever.     albuterol (VENTOLIN HFA) 108 (90 Base) MCG/ACT inhaler Inhale 1-2 puffs into the lungs every 4 (four) hours  as needed for wheezing or shortness of breath.     Calcium Carbonate-Vitamin D (CALTRATE 600+D PO) Take by mouth.     cetirizine (ZYRTEC) 10 MG tablet Take 10 mg by mouth daily.     Cholecalciferol (VITAMIN D-3 PO) Take 1 tablet by mouth daily.     Erenumab-aooe (AIMOVIG, 140 MG DOSE, [place]) One a month     losartan (COZAAR) 50 MG tablet TAKE 1 Tablet BY MOUTH ONCE EVERY DAY 90 tablet 1   Multiple Vitamins-Minerals (CENTRUM SILVER PO) Take 1 tablet by mouth daily.     omeprazole (PRILOSEC) 40 MG capsule TAKE 1 Capsule BY MOUTH ONCE EVERY DAY 90 capsule 1   phenylephrine-shark liver oil-mineral oil-petrolatum (PREPARATION H) 0.25-14-74.9 % rectal ointment Place 1 Application rectally 2 (two) times daily as needed for hemorrhoids.     polyethylene glycol-electrolytes (TRILYTE) 420 g solution Take 4,000 mLs by mouth as directed. 4000 mL 0   potassium chloride (KLOR-CON M) 10 MEQ tablet Take 1 tablet (10 mEq total) by mouth daily. 90 tablet 0   predniSONE (STERAPRED UNI-PAK 21 TAB) 10 MG (21) TBPK tablet 10 mg DS 12 as directed 48 tablet 0   rizatriptan (MAXALT) 10 MG tablet Take 10 mg by mouth as needed for migraine.     tiZANidine (ZANAFLEX) 2 MG tablet Take 2 mg by mouth 2 (two) times daily.  DULoxetine (CYMBALTA) 30 MG capsule Take 1 capsule (30 mg total) by mouth 2 (two) times daily. 60 capsule 0   furosemide (LASIX) 20 MG tablet TAKE 1 TABLET(20 MG) BY MOUTH DAILY 14 tablet 0   gabapentin (NEURONTIN) 300 MG capsule Take 3 capsules (900 mg total) by mouth 2 (two) times daily. 180 capsule 0   topiramate (TOPAMAX) 25 MG tablet Take 1 tablet (25 mg total) by mouth 2 (two) times daily. 60 tablet 0   traZODone (DESYREL) 50 MG tablet Take 1 tablet (50 mg total) by mouth at bedtime. 30 tablet 0    Results for orders placed or performed during the hospital encounter of 04/28/22 (from the past 48 hour(s))  CBC     Status: Abnormal   Collection Time: 04/28/22  3:46 PM  Result Value Ref Range   WBC  12.0 (H) 4.0 - 10.5 K/uL   RBC 4.52 3.87 - 5.11 MIL/uL   Hemoglobin 12.8 12.0 - 15.0 g/dL   HCT 38.8 36.0 - 46.0 %   MCV 85.8 80.0 - 100.0 fL   MCH 28.3 26.0 - 34.0 pg   MCHC 33.0 30.0 - 36.0 g/dL   RDW 12.8 11.5 - 15.5 %   Platelets 280 150 - 400 K/uL   nRBC 0.0 0.0 - 0.2 %    Comment: Performed at [facility], [address] [place], [place] [address]  Basic metabolic panel     Status: Abnormal   Collection Time: 04/28/22  3:46 PM  Result Value Ref Range   Sodium 137 135 - 145 mmol/L   Potassium 4.5 3.5 - 5.1 mmol/L   Chloride 105 98 - 111 mmol/L   CO2 25 22 - 32 mmol/L   Glucose, Bld 111 (H) 70 - 99 mg/dL    Comment: Glucose reference range applies only to samples taken after fasting for at least 8 hours.   BUN 31 (H) 6 - 20 mg/dL   Creatinine, Ser 1.00 0.44 - 1.00 mg/dL   Calcium 8.7 (L) 8.9 - 10.3 mg/dL   GFR, Estimated >60 >60 mL/min    Comment: (NOTE) Calculated using the CKD-EPI Creatinine Equation (2021)    Anion gap 7 5 - 15    Comment: Performed at [facility], [address], [place], [place] [address]   No results found.  Review of Systems  Constitutional: Negative.   HENT: Negative.    Eyes: Negative.   Respiratory: Negative.    Cardiovascular: Negative.   Gastrointestinal:  Positive for abdominal pain.  Endocrine: Negative.   Genitourinary: Negative.   Musculoskeletal: Negative.   Skin: Negative.   Allergic/Immunologic: Negative.   Neurological: Negative.   Hematological: Negative.   Psychiatric/Behavioral: Negative.      Blood pressure (!) 151/103, pulse 63, temperature 98.3 F (36.8 C), temperature source Oral, resp. rate 16, height '5\' 4"'$  (1.626 m), weight 88.5 kg, SpO2 95 %. Physical Exam  GENERAL: The patient is AO x3, in no acute distress. HEENT: Head is normocephalic and atraumatic. EOMI are intact. Mouth is well hydrated and without lesions. NECK: Supple. No masses LUNGS: Clear to auscultation. No presence of rhonchi/wheezing/rales.  Adequate chest expansion HEART: RRR, normal s1 and s2. ABDOMEN: Soft, nontender, no guarding, no peritoneal signs, and nondistended. BS +. No masses. EXTREMITIES: Without any cyanosis, clubbing, rash, lesions or edema. NEUROLOGIC: AOx3, no focal motor deficit. SKIN: no jaundice, no rashes  Assessment/Plan Shirin Echeverry is a 51 y.o. female with past medical history of acid reflux, craniofacial hyperhidrosis, HTN, RLS,  coming for evaluation of chronic abdominal pain and colorectal cancer screening.  We will proceed with EGD and colonoscopy.  Harvel Quale, MD 04/29/2022, 11:59 AM

## 2022-04-29 NOTE — Discharge Instructions (Signed)
You are being discharged to home.  Resume your previous diet.  We are waiting for your pathology results.  

## 2022-04-29 NOTE — Transfer of Care (Signed)
Immediate Anesthesia Transfer of Care Note  Patient: Briana Wyatt  Procedure(s) Performed: COLONOSCOPY WITH PROPOFOL ESOPHAGOGASTRODUODENOSCOPY (EGD) WITH PROPOFOL BIOPSY  Patient Location: Endoscopy Unit  Anesthesia Type:General  Level of Consciousness: awake  Airway & Oxygen Therapy: Patient Spontanous Breathing  Post-op Assessment: Report given to RN and Post -op Vital signs reviewed and stable  Post vital signs: Reviewed and stable  Last Vitals:  Vitals Value Taken Time  BP 120/79 04/29/22 1327  Temp 36.6 C 04/29/22 1327  Pulse 93 04/29/22 1327  Resp 18 04/29/22 1327  SpO2 93 % 04/29/22 1327    Last Pain:  Vitals:   04/29/22 1327  TempSrc: Oral  PainSc: 0-No pain      Patients Stated Pain Goal: 9 (11/00/34 9611)  Complications: No notable events documented.

## 2022-04-29 NOTE — Anesthesia Postprocedure Evaluation (Signed)
Anesthesia Post Note  Patient: Briana Wyatt  Procedure(s) Performed: COLONOSCOPY WITH PROPOFOL ESOPHAGOGASTRODUODENOSCOPY (EGD) WITH PROPOFOL BIOPSY  Patient location during evaluation: Phase II Anesthesia Type: General Level of consciousness: awake and alert and oriented Pain management: pain level controlled Vital Signs Assessment: post-procedure vital signs reviewed and stable Respiratory status: spontaneous breathing, nonlabored ventilation and respiratory function stable Cardiovascular status: blood pressure returned to baseline and stable Postop Assessment: no apparent nausea or vomiting Anesthetic complications: no   No notable events documented.   Last Vitals:  Vitals:   04/29/22 1154 04/29/22 1327  BP: (!) 151/103 120/79  Pulse: 63 93  Resp: 16 18  Temp: 36.8 C 36.6 C  SpO2: 95% 93%    Last Pain:  Vitals:   04/29/22 1335  TempSrc:   PainSc: 6                  Briana Wyatt

## 2022-04-29 NOTE — Op Note (Signed)
[facility] Patient Name: Briana Wyatt Procedure Date: 04/29/2022 12:06 PM MRN: [id number] Date of Birth: [birth year] Attending MD: Maylon Peppers ,  CSN: [id number] Age: 50 Admit Type: Outpatient Procedure:                Upper GI endoscopy Indications:              Abdominal pain Providers:                Maylon Peppers, Charlsie Quest. Insurance claims handler, Therapist, sports,                            Suzan Garibaldi. Risa Grill, Technician Referring MD:              Medicines:                Monitored Anesthesia Care Complications:            No immediate complications. Estimated Blood Loss:     Estimated blood loss: none. Procedure:                Pre-Anesthesia Assessment:                           - Prior to the procedure, a History and Physical                            was performed, and patient medications, allergies                            and sensitivities were reviewed. The patient's                            tolerance of previous anesthesia was reviewed.                           - The risks and benefits of the procedure and the                            sedation options and risks were discussed with the                            patient. All questions were answered and informed                            consent was obtained.                           - ASA Grade Assessment: II - A patient with mild                            systemic disease.                           After obtaining informed consent, the endoscope was                            passed under direct vision. Throughout the  procedure, the patient's blood pressure, pulse, and                            oxygen saturations were monitored continuously. The                            [id number]-[id number] ([id number]) scope was introduced through the                            mouth, and advanced to the second part of duodenum.                            The upper GI endoscopy was accomplished without                             difficulty. The patient tolerated the procedure                            well. Scope In: 12:28:14 PM Scope Out: 12:37:16 PM Total Procedure Duration: 0 hours 9 minutes 2 seconds  Findings:      The esophagus and gastroesophageal junction were examined with white       light and narrow band imaging (NBI). There were esophageal mucosal       changes suspicious for short-segment Barrett's esophagus, classified as       Barrett's stage C0-M1 per Prague criteria. These changes involved the       mucosa at the upper extent of the gastric folds (35 cm from the       incisors) extending to the Z-line (34 cm from the incisors). One tongue       of salmon-colored mucosa was present from 34 to 35 cm. The maximum       longitudinal extent of these esophageal mucosal changes was 1 cm in       length. This was biopsied with a cold forceps for histology.      A 2 cm hiatal hernia was present.      The gastroesophageal flap valve was visualized endoscopically and       classified as Hill Grade II (fold present, opens with respiration).      A few localized erosions with no stigmata of recent bleeding were found       in the gastric antrum. Biopsies were taken with a cold forceps for       Helicobacter pylori testing.      The examined duodenum was normal. Impression:               - Esophageal mucosal changes suspicious for                            short-segment Barrett's esophagus, classified as                            Barrett's stage C0-M1 per Prague criteria. Biopsied.                           - 2 cm hiatal hernia.                           -  Gastroesophageal flap valve classified as Hill                            Grade II (fold present, opens with respiration).                           - Erosive gastropathy with no stigmata of recent                            bleeding. Biopsied.                           - Normal examined duodenum. Moderate Sedation:      Per Anesthesia  Care Recommendation:           - Discharge patient to home (ambulatory).                           - Resume previous diet.                           - Await pathology results. Procedure Code(s):        --- Professional ---                           [phone], Esophagogastroduodenoscopy, flexible,                            transoral; with biopsy, single or multiple Diagnosis Code(s):        --- Professional ---                           K22.70, Barrett's esophagus without dysplasia                           K44.9, Diaphragmatic hernia without obstruction or                            gangrene                           K31.89, Other diseases of stomach and duodenum                           R10.9, Unspecified abdominal pain CPT copyright 2019 American [employer] Association. All rights reserved. The codes documented in this report are preliminary and upon coder review may  be revised to meet current compliance requirements. Maylon Peppers, MD Maylon Peppers,  04/29/2022 12:42:24 PM This report has been signed electronically. Number of Addenda: 0

## 2022-04-29 NOTE — Op Note (Signed)
[facility] Patient Name: Briana Wyatt Procedure Date: 04/29/2022 12:39 PM MRN: [id number] Date of Birth: 1970/11/25 Attending MD: Maylon Peppers ,  CSN: [id number] Age: 51 Admit Type: Outpatient Procedure:                Colonoscopy Indications:              Screening for colorectal malignant neoplasm Providers:                Maylon Peppers, Charlsie Quest. Insurance claims handler, Therapist, sports,                            Suzan Garibaldi. Risa Grill, Technician Referring MD:              Medicines:                Monitored Anesthesia Care Complications:            No immediate complications. Estimated Blood Loss:     Estimated blood loss: none. Procedure:                Pre-Anesthesia Assessment:                           - Prior to the procedure, a History and Physical                            was performed, and patient medications, allergies                            and sensitivities were reviewed. The patient's                            tolerance of previous anesthesia was reviewed.                           - The risks and benefits of the procedure and the                            sedation options and risks were discussed with the                            patient. All questions were answered and informed                            consent was obtained.                           - ASA Grade Assessment: II - A patient with mild                            systemic disease.                           After obtaining informed consent, the colonoscope                            was passed under direct vision. Throughout the  procedure, the patient's blood pressure, pulse, and                            oxygen saturations were monitored continuously. The                            [id number]-[id number] ([id number]) scope was introduced through                            the anus and advanced to the the cecum, identified                            by appendiceal orifice and ileocecal  valve. The                            colonoscopy was performed without difficulty. The                            patient tolerated the procedure well. The quality                            of the bowel preparation was excellent. Scope In: 12:43:21 PM Scope Out: 1:25:04 PM Scope Withdrawal Time: 0 hours 16 minutes 38 seconds  Total Procedure Duration: 0 hours 41 minutes 43 seconds  Findings:      Hemorrhoids were found on perianal exam.      A 2 mm polyp was found in the transverse colon. The polyp was sessile.       The polyp was removed with a cold biopsy forceps. Resection and       retrieval were complete.      The transverse colon and ascending colon were significantly tortuous.       Intermittent abdominal pressure was applied in multiple places to       effectively advance the scope. Patient was also put on her back.      Non-bleeding external and internal hemorrhoids were found during       retroflexion and during perianal exam. The hemorrhoids were small. Impression:               - Hemorrhoids found on perianal exam.                           - One 2 mm polyp in the transverse colon, removed                            with a cold biopsy forceps. Resected and retrieved.                           - Tortuous colon.                           - Non-bleeding external and internal hemorrhoids. Moderate Sedation:      Per Anesthesia Care Recommendation:           - Discharge patient to home (ambulatory).                           -  Resume previous diet.                           - Await pathology results.                           - Repeat colonoscopy in 10 years for surveillance.                           - Schedule CT abdomen/pelvis with IV contrast for                            evaluation of abdominal pain. Procedure Code(s):        --- Professional ---                           [phone], Colonoscopy, flexible; with biopsy, single                            or  multiple Diagnosis Code(s):        --- Professional ---                           Z12.11, Encounter for screening for malignant                            neoplasm of colon                           K64.8, Other hemorrhoids                           K63.5, Polyp of colon                           Q43.8, Other specified congenital malformations of                            intestine CPT copyright 2019 American Medical Association. All rights reserved. The codes documented in this report are preliminary and upon coder review may  be revised to meet current compliance requirements. Maylon Peppers, MD Maylon Peppers,  04/29/2022 1:35:14 PM This report has been signed electronically. Number of Addenda: 0

## 2022-04-30 ENCOUNTER — Encounter: Payer: Self-pay | Admitting: Physician Assistant

## 2022-04-30 ENCOUNTER — Ambulatory Visit: Payer: Medicaid Other | Admitting: Physician Assistant

## 2022-04-30 VITALS — BP 124/82 | HR 73 | Temp 96.0°F | Ht 66.0 in | Wt 197.0 lb

## 2022-04-30 DIAGNOSIS — R61 Generalized hyperhidrosis: Secondary | ICD-10-CM

## 2022-04-30 DIAGNOSIS — R609 Edema, unspecified: Secondary | ICD-10-CM

## 2022-04-30 LAB — SURGICAL PATHOLOGY

## 2022-04-30 MED ORDER — POTASSIUM CHLORIDE CRYS ER 10 MEQ PO TBCR: 10.0000 meq | EXTENDED_RELEASE_TABLET | ORAL | 0 refills | Status: DC

## 2022-04-30 MED ORDER — FUROSEMIDE 20 MG PO TABS
ORAL_TABLET | ORAL | 0 refills | Status: DC
Start: 2022-04-30 — End: 2022-06-26

## 2022-04-30 NOTE — Progress Notes (Signed)
BP 124/82   Pulse 73   Temp (!) 96 F (35.6 C)   Ht '5\' 6"'$  (1.676 m)   Wt 197 lb (89.4 kg)   SpO2 99%   BMI 31.80 kg/m    Subjective:    Patient ID: Briana Wyatt, female    DOB: 12-17-1970, 51 y.o.   MRN: [id number]  HPI: Keisi Eckford is a 51 y.o. female presenting on 04/30/2022 for Edema   HPI  Chief Complaint  Patient presents with   Edema     She is currently taking lasix bid  and K+ qd She says her edema is gone now   Pt wants rx for HRT due to her sweating.  She says LMP january 2022.  She says she was on estradiol in the past.  She does not smoke    Relevant past medical, surgical, family and social history reviewed and updated as indicated. Interim medical history since our last visit reviewed. Allergies and medications reviewed and updated.   Current Outpatient Medications:    acetaminophen (TYLENOL) 650 MG CR tablet, Take 650 mg by mouth every 8 (eight) hours as needed for pain or fever., Disp: , Rfl:    albuterol (VENTOLIN HFA) 108 (90 Base) MCG/ACT inhaler, Inhale 1-2 puffs into the lungs every 4 (four) hours as needed for wheezing or shortness of breath., Disp: , Rfl:    Calcium Carbonate-Vitamin D (CALTRATE 600+D PO), Take by mouth., Disp: , Rfl:    cetirizine (ZYRTEC) 10 MG tablet, Take 10 mg by mouth daily., Disp: , Rfl:    Cholecalciferol (VITAMIN D-3 PO), Take 1 tablet by mouth daily., Disp: , Rfl:    DULoxetine (CYMBALTA) 30 MG capsule, Take 1 capsule (30 mg total) by mouth 2 (two) times daily., Disp: 60 capsule, Rfl: 0   Erenumab-aooe (AIMOVIG, 140 MG DOSE, [place]), One a month, Disp: , Rfl:    gabapentin (NEURONTIN) 300 MG capsule, Take 3 capsules (900 mg total) by mouth 2 (two) times daily., Disp: 180 capsule, Rfl: 0   losartan (COZAAR) 50 MG tablet, TAKE 1 Tablet BY MOUTH ONCE EVERY DAY, Disp: 90 tablet, Rfl: 1   Multiple Vitamins-Minerals (CENTRUM SILVER PO), Take 1 tablet by mouth daily., Disp: , Rfl:    omeprazole (PRILOSEC) 40 MG  capsule, TAKE 1 Capsule BY MOUTH ONCE EVERY DAY, Disp: 90 capsule, Rfl: 1   phenylephrine-shark liver oil-mineral oil-petrolatum (PREPARATION H) 0.25-14-74.9 % rectal ointment, Place 1 Application rectally 2 (two) times daily as needed for hemorrhoids., Disp: , Rfl:    potassium chloride (KLOR-CON M) 10 MEQ tablet, Take 1 tablet (10 mEq total) by mouth daily., Disp: 90 tablet, Rfl: 0   rizatriptan (MAXALT) 10 MG tablet, Take 10 mg by mouth as needed for migraine., Disp: , Rfl:    tiZANidine (ZANAFLEX) 2 MG tablet, Take 2 mg by mouth 2 (two) times daily., Disp: , Rfl:    furosemide (LASIX) 20 MG tablet, TAKE 1 TABLET(20 MG) BY MOUTH DAILY, Disp: 14 tablet, Rfl: 0   topiramate (TOPAMAX) 25 MG tablet, Take 1 tablet (25 mg total) by mouth 2 (two) times daily., Disp: 60 tablet, Rfl: 0   traZODone (DESYREL) 50 MG tablet, Take 1 tablet (50 mg total) by mouth at bedtime. (Patient not taking: Reported on 04/30/2022), Disp: 30 tablet, Rfl: 0   Review of Systems  Per HPI unless specifically indicated above     Objective:    BP 124/82   Pulse 73   Temp (!) 96 F (35.6  C)   Ht '5\' 6"'$  (1.676 m)   Wt 197 lb (89.4 kg)   SpO2 99%   BMI 31.80 kg/m   Wt Readings from Last 3 Encounters:  04/30/22 197 lb (89.4 kg)  04/29/22 195 lb (88.5 kg)  04/23/22 198 lb (89.8 kg)    Physical Exam Vitals reviewed.  Constitutional:      Appearance: She is well-developed.  HENT:     Head: Normocephalic and atraumatic.  Cardiovascular:     Rate and Rhythm: Normal rate and regular rhythm.  Pulmonary:     Effort: Pulmonary effort is normal.     Breath sounds: Normal breath sounds.  Abdominal:     General: Bowel sounds are normal.     Palpations: Abdomen is soft. There is no mass.     Tenderness: There is no abdominal tenderness.  Musculoskeletal:     Cervical back: Neck supple.     Right lower leg: No edema.     Left lower leg: No edema.     Comments: Sores are legs are healing with no openly draining wounds  and no erythema  Lymphadenopathy:     Cervical: No cervical adenopathy.  Skin:    General: Skin is warm and dry.  Neurological:     Mental Status: She is alert and oriented to person, place, and time.  Psychiatric:        Behavior: Behavior normal.           Assessment & Plan:    Encounter Diagnoses  Name Primary?   Edema, unspecified type Yes   Craniofacial hyperhidrosis      -Cut back lasix to half current-    new:  qd lasix and K qod -will Check on hormones- interactions and availability -pt to F/u next mo as scheduled.  She is to contact office sooner prn

## 2022-05-04 ENCOUNTER — Ambulatory Visit (INDEPENDENT_AMBULATORY_CARE_PROVIDER_SITE_OTHER): Payer: Self-pay | Admitting: Gastroenterology

## 2022-05-04 ENCOUNTER — Encounter (INDEPENDENT_AMBULATORY_CARE_PROVIDER_SITE_OTHER): Payer: Self-pay | Admitting: Gastroenterology

## 2022-05-04 VITALS — BP 135/86 | HR 103 | Temp 98.4°F | Ht 66.0 in | Wt 195.3 lb

## 2022-05-04 DIAGNOSIS — R110 Nausea: Secondary | ICD-10-CM

## 2022-05-04 DIAGNOSIS — K2270 Barrett's esophagus without dysplasia: Secondary | ICD-10-CM

## 2022-05-04 DIAGNOSIS — K219 Gastro-esophageal reflux disease without esophagitis: Secondary | ICD-10-CM

## 2022-05-04 NOTE — Patient Instructions (Signed)
Please continue omeprazole '40mg'$  daily, with your barrett's esophagus, it is important to keep GERD symptoms well controlled In regards to gas, you can try over the counter gas x or phazyme Also, be mindful that foods that cause more gas tend to be things such as beans, certain veggies such as cauliflower/broccoli, eggs, dairy products, high fiber foods and some fruits such as apples, peaches and pears If you find that stools are becoming harder to pass again, you can Start taking Miralax 1 capful every day for one week. If bowel movements do not improve, increase to 1 capful every 12 hours. If after two weeks there is no improvement, increase to 1 capful every 8 hours   Follow up 1 year

## 2022-05-04 NOTE — Progress Notes (Signed)
Referring Provider: Soyla Dryer, PA-C Primary Care Physician:  Soyla Dryer, PA-C Primary GI Physician: Jenetta Downer   Chief Complaint  Patient presents with   Follow-up    Follow up on colonoscopy results. Still having lower abdominal pain.    HPI:   Briana Wyatt is a 51 y.o. female with past medical history of acid reflux, craniofacial hyperhidrosis, HTN, RLS.   Patient presenting today for follow up.  Last seen 02/19/22, at that time having issues with large hemorrhoids, large, painful to pass stools and toilet tissue hematochezia. Also with difficulty cleaning herself after BMs due to presence of hemorrhoids. Hgb normal at 12.9 in may but maintained on Iron QID, no iron levels evaluated by PCP prior to her statrting that. Reported some nausea with no vomiting.   Recommended to stop iron pills, schedule EGD and colonoscopy, start miralax and Rx anusol TID x10 days for hemorrhoids.  Present: Last hgb was 12.8 on 04/29/22  Patient reports that she has some continued lower abdominal/groin pain, this has been present for a few years, she has had this evaluated by GYN which was unremarkable. She denies any urinary symptoms, sometimes lying in the fetal position improves pain. It does not improve with BMs.  She denies constipation at current, states that since colonoscopy stools have been softer, going 2-3x/day. Reports a lot of flatulence. Denies blood in stools. Hemorrhoids are still present, but she denies any itching, burning or pain. Appetite is fair, weight down 5 pounds since her last visit. She does continue to have nausea but thinks this is related to her migraines for which she is having further evaluation of with lumbar puncture coming up. Feels that GERD is well controlled with omeprazole '40mg'$  without breakthrough. She endorses some issues with dysphagia because she cannot eat easily with her dentures so she chews as well as she can with the few teeth she has, sometimes foods  feel stuck at sternal notch, she has had no episodes of food impaction. Patient did stop iron pills as instructed at last OV.   Last Colonoscopy:04/29/22- Hemorrhoids found on perianal exam. - One 2 mm polyp in the transverse colon-hyperplastic polyp - Tortuous colon. - Non-bleeding external and internal hemorrhoids. Last Endoscopy:04/29/22  Esophageal mucosal changes suspicious for short-segment Barrett's esophagus, classified as Barrett's stage C0-M1 per Prague criteria. (BE w/o dysplasia) - 2 cm hiatal hernia. - Gastroesophageal flap valve classified as Hill Grade II (fold present, opens with respiration). - Erosive gastropathy with no stigmata of recent bleeding. Biopsied-normal - Normal examined duodenum.  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     albuterol (VENTOLIN HFA) 108 (90 Base) MCG/ACT inhaler Inhale 1-2 puffs into the lungs every 4 (four) hours as needed for wheezing or shortness of breath.     Calcium Carbonate-Vitamin D (CALTRATE 600+D  PO) Take by mouth.     cetirizine (ZYRTEC) 10 MG tablet Take 10 mg by mouth daily.     Cholecalciferol (VITAMIN D-3 PO) Take 1 tablet by mouth daily.     DULoxetine (CYMBALTA) 30 MG capsule Take 1 capsule (30 mg total) by mouth 2 (two) times daily. 60 capsule 0   Erenumab-aooe (AIMOVIG, 140 MG DOSE, [place]) One a month     furosemide (LASIX) 20 MG tablet TAKE 1  TABLET(20 MG) BY MOUTH DAILY 14 tablet 0   gabapentin (NEURONTIN) 300 MG capsule Take 3 capsules (900 mg total) by mouth 2 (two) times daily. 180 capsule 0   losartan (COZAAR) 50 MG tablet TAKE 1 Tablet BY MOUTH ONCE EVERY DAY 90 tablet 1   Multiple Vitamins-Minerals (CENTRUM SILVER PO) Take 1 tablet by mouth daily.     omeprazole (PRILOSEC) 40 MG capsule TAKE 1 Capsule BY MOUTH ONCE EVERY DAY 90 capsule 1   phenylephrine-shark liver oil-mineral oil-petrolatum (PREPARATION H) 0.25-14-74.9 % rectal ointment Place 1 Application rectally 2 (two) times daily as needed for hemorrhoids.     potassium chloride (KLOR-CON M) 10 MEQ tablet Take 1 tablet (10 mEq total) by mouth every other day. 90 tablet 0   rizatriptan (MAXALT) 10 MG tablet Take 10 mg by mouth as needed for migraine.     tiZANidine (ZANAFLEX) 2 MG tablet Take 2 mg by mouth 2 (two) times daily.     topiramate (TOPAMAX) 25 MG tablet Take 1 tablet (25 mg total) by mouth 2 (two) times daily. 60 tablet 0   traZODone (DESYREL) 50 MG tablet Take 1 tablet (50 mg total) by mouth at bedtime. (Patient not taking: Reported on 04/30/2022) 30 tablet 0   No current facility-administered medications for this visit.    Allergies as of 05/04/2022 - Review Complete 05/04/2022  Allergen Reaction Noted   Codeine Nausea And Vomiting 05/26/2017   Methocarbamol Itching 05/26/2017   Nsaids  04/07/2019    Family History  Problem Relation Age of Onset   Hypertension Father    Stroke Father     Social History   Socioeconomic History   Marital status: Single    Spouse name: Not on file   Number of children: 3   Years of education: Not on file   Highest education level: GED or equivalent  Occupational History   Not on file  Tobacco Use   Smoking status: Former    Types: Cigarettes    Quit date: 08/25/1995    Years since quitting: 26.7    Passive exposure: Past   Smokeless tobacco: Never  Vaping Use   Vaping Use: Never used  Substance and Sexual  Activity   Alcohol use: No   Drug use: Yes    Types: "Crack" cocaine    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   Sexual activity: Not Currently    Birth control/protection: Surgical  Other Topics Concern   Not on file  Social History Narrative   Not on file   Social Determinants of Health   Financial Resource Strain: Not on file  Food Insecurity: No Food Insecurity (11/06/2021)   Hunger Vital Sign    Worried About Running Out of Food in the Last Year: Never true    Ran Out of Food in the Last Year: Never true  Transportation Needs: No Transportation Needs (11/06/2021)   PRAPARE - Hydrologist (Medical): No    Lack of Transportation (  Non-Medical): No  Physical Activity: Not on file  Stress: Not on file  Social Connections: Not on file   Review of systems General: negative for malaise, night sweats, fever, chills, weight loss Neck: Negative for lumps, goiter, pain and significant neck swelling Resp: Negative for cough, wheezing, dyspnea at rest CV: Negative for chest pain, leg swelling, palpitations, orthopnea GI: denies melena, hematochezia,vomiting, diarrhea, constipation, dysphagia, odyonophagia, early satiety or unintentional weight loss.  +nausea +hemorrhoids  MSK: Negative for joint pain or swelling, back pain, and muscle pain +groin pain Derm: Negative for itching or rash Psych: Denies depression, anxiety, memory loss, confusion. No homicidal or suicidal ideation.  Heme: Negative for prolonged bleeding, bruising easily, and swollen nodes. Endocrine: Negative for cold or heat intolerance, polyuria, polydipsia and goiter. Neuro: negative for tremor, gait imbalance, syncope and seizures. The remainder of the review of systems is noncontributory.  Physical Exam: There were no vitals taken for this visit. General:   Alert and oriented. No distress noted. Pleasant and cooperative.  Head:  Normocephalic and atraumatic. Eyes:   Conjuctiva clear without scleral icterus. Mouth:  Oral mucosa pink and moist. Good dentition. No lesions. Heart: Normal rate and rhythm, s1 and s2 heart sounds present.  Lungs: Clear lung sounds in all lobes. Respirations equal and unlabored. Abdomen:  +BS, soft, non-tender and non-distended. No rebound or guarding. No HSM or masses noted. Derm: No palmar erythema or jaundice Msk:  Symmetrical without gross deformities. Normal posture. Extremities:  Without edema. Neurologic:  Alert and  oriented x4 Psych:  Alert and cooperative. Normal mood and affect.  Invalid input(s): "6 MONTHS"   ASSESSMENT: Sharolyn Weber is a 51 y.o. female presenting today for follow up.  Colonoscopy and EGD done 9/6 with presence of hyperplastic polyp, hemorrhoids and barretts esophagus without dysplasia. Having no current issues with constipation, having 2-3 soft BMs per day. Denies rectal bleeding, feels presence of hemorrhoids but no itching, burning or pain. She does note a lot of gas which she is not taking anything for, we discussed foods that commonly cause gas and trying otc gas x or phazyme.   GERD is well managed on once daily omeprazole '40mg'$  without breakthrough. She does not some issues with feeling that food sticks at sternal notch but also is unable to wear dentures to eat so chewing foods is not always that easy. I did discuss Barrett's esophagus further with her and the importance of keeping GERD well managed. Will continue with daily omeprazole for now.  In regards to groin pain, previous OB GYN evaluation was unremarkable. Suspect this is more of a musculoskeletal pain given her presentation. Abdominal exam is unremarkable, low suspicion for this being GI related pain. She will let me know if she begins to have associated abdominal pain or other GI symptoms.   PLAN:  Continue omeprazole '40mg'$  daily 2. Otc phazyme or gas x for flatulence  3. Avoid gas causing foods  All questions were answered,  patient verbalized understanding and is in agreement with plan as outlined above.   Follow Up: 1 year  Arion Shankles L. Alver Sorrow, MSN, APRN, AGNP-C Adult-Gerontology Nurse Practitioner [facility]

## 2022-05-05 NOTE — Telephone Encounter (Signed)
Patient called requesting a referral for back pain. An appointment was made to see [facility].

## 2022-05-06 ENCOUNTER — Encounter (HOSPITAL_COMMUNITY): Payer: Self-pay | Admitting: Gastroenterology

## 2022-05-09 NOTE — Progress Notes (Signed)
Office Visit Note  Patient: Briana Wyatt             Date of Birth: 1971-02-11           MRN: [id number]             PCP: Soyla Dryer, PA-C Referring: Soyla Dryer, PA-C Visit Date: 05/11/2022   Subjective:  New Patient (Initial Visit) (Bilateral hand joint pain, stiffness, numbness, and swelling. )   History of Present Illness: Briana Wyatt is a 51 y.o. female here for evaluation of low positive ANCA with MPO Abs. Autoimmune lab workup has been in the setting of numerous symptoms including headaches, fatigue, paresthesias, and joint pain in numerous areas. Imaging consistent with brain white matter lesions no spinal lesions but does have severe stenosis.  Her biggest complaint in particular has been pain stiffness numbness and swelling affecting both of her hands.  She notices symptoms coming and going throughout the day no specific time activity or position.  When she sees swelling it is pretty diffuse throughout the hand without any discoloration.  She is having a persistent numbness issues on the right hand affecting the fourth and fifth finger and part of the third finger.  She thinks this has been somewhat progressive over time.  She also has a lot of stiffness or pain up at the right shoulder or right side of her neck.  She does not take many OTC medications due to having only 1 kidney.  She is prescribed gabapentin 900 mg twice daily, duloxetine 30 mg twice daily, and tizanidine 2 mg at bedtime. She did have a previous arthroscopic repair of right shoulder rotator cuff tear.  She has had bilateral carpal tunnel release surgery with good resolution of symptoms.  She had a repeated nerve conduction study in June to assess this which demonstrated mild median nerve impingement but no other specific lesions identified.  MRI imaging of the brain showed some nonspecific white matter lesions.  There were no lesions consistent with MS in the spine imaging but does have significant  multilevel cervical spine disease with spinal stenosis.  Activities of Daily Living:  Patient reports morning stiffness for 30-60 minutes.   Patient Reports nocturnal pain.  Difficulty dressing/grooming: Denies Difficulty climbing stairs: Reports Difficulty getting out of chair: Reports Difficulty using hands for taps, buttons, cutlery, and/or writing: Reports  Review of Systems  Constitutional:  Positive for fatigue.  HENT:  Positive for mouth dryness. Negative for mouth sores.   Eyes:  Positive for dryness.  Respiratory:  Positive for shortness of breath.   Cardiovascular:  Positive for chest pain and palpitations.  Gastrointestinal:  Negative for blood in stool, constipation and diarrhea.  Endocrine: Positive for increased urination.  Genitourinary:  Negative for involuntary urination.  Musculoskeletal:  Positive for joint pain, gait problem, joint pain, joint swelling, myalgias, muscle weakness, morning stiffness, muscle tenderness and myalgias.  Skin:  Positive for hair loss. Negative for color change, rash and sensitivity to sunlight.  Allergic/Immunologic: Negative for susceptible to infections.  Neurological:  Positive for dizziness and headaches.  Hematological:  Positive for swollen glands.  Psychiatric/Behavioral:  Positive for depressed mood and sleep disturbance. The patient is nervous/anxious.     PMFS History:  Patient Active Problem List   Diagnosis Date Noted   Abnormal ANCA test 05/11/2022   Cervicogenic headache 05/11/2022   Bilateral hand pain 05/11/2022   Barrett's esophagus without dysplasia 05/04/2022   Gastroesophageal reflux disease without esophagitis 05/04/2022   MDD (major  depressive disorder), recurrent severe, without psychosis ([place]) 03/16/2022   GAD (generalized anxiety disorder) 03/12/2022   PTSD (post-traumatic stress disorder) 03/12/2022   Intermittent explosive disorder 03/12/2022   Stimulant use disorder 03/12/2022   Abdominal pain,  epigastric 02/19/2022   Hemorrhoids 02/19/2022   Nausea without vomiting 02/19/2022   Encounter for screening colonoscopy 02/19/2022   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   Cocaine-induced mood disorder ([place])     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   Hypertension    Hypothyroidism    Kidney disease, chronic, stage III (moderate, EGFR 30-59 ml/min) (HCC)    Movement disorder    PTSD (post-traumatic stress disorder)    Restless leg syndrome    Sciatica of right side    Spondylosis    Cervical and lumbar   Tortuous colon     Family History  Problem Relation Age of Onset   Hypertension Father    Stroke Father    Hypertension Sister    Dystonia Sister    Past Surgical History:  Procedure Laterality Date   BIOPSY  04/29/2022   Procedure: BIOPSY;  Surgeon: Harvel Quale, MD;  [facility]: [facility];  Service: Gastroenterology;;   Ferdinand Lango TUNNEL RELEASE Bilateral    COLONOSCOPY WITH PROPOFOL N/A 04/29/2022   Procedure: COLONOSCOPY WITH PROPOFOL;  Surgeon: Harvel Quale, MD;  Location: [facility];  Service: Gastroenterology;  Laterality: N/A;  145   ECTOPIC PREGNANCY SURGERY     ESOPHAGOGASTRODUODENOSCOPY (EGD) WITH PROPOFOL N/A 04/29/2022   Procedure: ESOPHAGOGASTRODUODENOSCOPY (EGD) WITH PROPOFOL;  Surgeon: Harvel Quale, MD;  Location: [facility];  Service: Gastroenterology;  Laterality: N/A;   SHOULDER SURGERY Right    SHOULDER SURGERY Right    TUBAL LIGATION     Social History   Social History Narrative   Not on file   Immunization History  Administered Date(s) Administered   Moderna SARS-COV2 Booster Vaccination 06/27/2020   Moderna Sars-Covid-2 Vaccination 12/14/2019, 01/18/2020   Tdap 04/18/2018     Objective: Vital Signs: BP 106/74 (BP Location: Right Arm, Patient  Position: Sitting, Cuff Size: Normal)   Pulse 98   Resp 14   Ht 5' 5.5" (1.664 m)   Wt 198 lb 6.4 oz (90 kg)   BMI 32.51 kg/m    Physical Exam Cardiovascular:     Rate and Rhythm: Normal rate and regular rhythm.  Pulmonary:     Effort: Pulmonary effort is normal.     Breath sounds: Normal breath sounds.  Lymphadenopathy:     Cervical: No cervical adenopathy.  Skin:    General: Skin is warm and dry.     Comments: Excoriated round lesions on both shins, some unroofed some residual chronic hyperpigmented patches  Neurological:     Mental Status: She is alert.  Psychiatric:     Comments: Somewhat flat affect      Musculoskeletal Exam:  Neck full ROM right sided paraspinal muscle in back with palpable knot and severe radiating pain up to scalp and down back Paraspinal muscle tenderness to pressure at multiple levels, extensive symptom radiation along back or into shoulders Elbows full ROM no tenderness or swelling Wrists full ROM no tenderness or swelling Fingers full ROM no tenderness DIP finger nodules or swelling Knees full ROM no tenderness or swelling Ankles full ROM no  tenderness or swelling   Investigation: No additional findings.  Imaging: XR Hand 2 View Left  Result Date: 05/11/2022 X-ray left hand 2 views Radiocarpal joint space appears normal.  Chronic appearing changes and ulnocarpal joint space and ulnar styloid process.  Moderate first CMC joint osteophyte formation.  MCP and PIP joint spaces appear normal.  Mild DIP joint osteoarthritis with very early osteophytes worst at fifth finger.  No erosions or abnormal calcifications seen.  Bone mineralization appears normal. Impression Osteoarthritis changes most advanced at the first [facility] joint lesser at DIPs  XR Hand 2 View Right  Result Date: 05/11/2022 X-ray right hand 2 views Radiocarpal joint space appears normal.  There are mild degenerative changes of the first [facility] joint with several subchondral cysts.  MCP  joint space appears normal.  PIP joint space appears normal.  There are some cystic appearing changes at multiple DIP joints and periarticular calcification at the second DIP.  No erosions seen.  Bone mineralization appears normal. Impression Degenerative appearing arthritis changes most at first [facility] and in DIP joints  DG Lumbar Spine 2-3 Views  Result Date: 04/17/2022 X-rays of the lumbar spine were obtained in clinic today.  No acute injuries are noted.  Well-maintained disc height.  There are some small osteophytes noted on the anterior aspects of multiple vertebral bodies.  No evidence of anterolisthesis.  Impression: Lumbar spine x-rays with mild degenerative changes    Recent Labs: Lab Results  Component Value Date   WBC 12.0 (H) 04/28/2022   HGB 12.8 04/28/2022   PLT 280 04/28/2022   NA 137 04/28/2022   K 4.5 04/28/2022   CL 105 04/28/2022   CO2 25 04/28/2022   GLUCOSE 111 (H) 04/28/2022   BUN 31 (H) 04/28/2022   CREATININE 1.00 04/28/2022   BILITOT 0.5 03/12/2022   ALKPHOS 87 03/12/2022   AST 26 03/12/2022   ALT 29 03/12/2022   PROT 7.2 03/12/2022   ALBUMIN 3.9 03/12/2022   CALCIUM 8.7 (L) 04/28/2022    Speciality Comments: No specialty comments available.  Procedures:  No procedures performed Allergies: Codeine, Methocarbamol, and Nsaids   Assessment / Plan:     Visit Diagnoses: Abnormal ANCA test - Plan: ANCA Screen Reflex Titer(QUEST), C3 and C4, Cyclic citrul peptide antibody, IgG  Joint pain in multiple areas and skin rashes otherwise no specific clinical criteria.  Previous ANCA was fairly low titer we will recheck screen with reflex titer today.  Also checking CCP antibody screening for RA and serum complements.  Overall lower pretest suspicion for connective tissue disease causing the current symptom complaints.  Cervicogenic headache  Reported headaches are highly suggestive for cervicogenic source.  There is palpable muscular knots in paraspinal muscles may  need combination of treatment to improve such as local injections as well as physical therapy.  Bilateral hand pain - Plan: XR Hand 2 View Right, XR Hand 2 View Left  No obvious inflammatory changes in hands on exam.  X-ray of bilateral hands show mild to moderate degenerative changes most severe at the first [facility] joint of hands with some DIP joint involvement.  No erosions or abnormal calcifications indicate chronic inflammatory disease activity.  Orders: Orders Placed This Encounter  Procedures   XR Hand 2 View Right   XR Hand 2 View Left   ANCA Screen Reflex Titer(QUEST)   C3 and C4   Cyclic citrul peptide antibody, IgG   No orders of the defined types were placed in this encounter.    Follow-Up  Instructions: Return in about 3 weeks (around 06/01/2022) for New pt ?ANCA/myo f/u.   Collier Salina, MD  Note - This record has been created using [employer].  Chart creation errors have been sought, but may not always  have been located. Such creation errors do not reflect on  the standard of medical care.

## 2022-05-11 ENCOUNTER — Ambulatory Visit (INDEPENDENT_AMBULATORY_CARE_PROVIDER_SITE_OTHER): Payer: Medicaid Other

## 2022-05-11 ENCOUNTER — Encounter: Payer: Self-pay | Admitting: Internal Medicine

## 2022-05-11 ENCOUNTER — Ambulatory Visit: Payer: Medicaid Other | Attending: Internal Medicine | Admitting: Internal Medicine

## 2022-05-11 VITALS — BP 106/74 | HR 98 | Resp 14 | Ht 65.5 in | Wt 198.4 lb

## 2022-05-11 DIAGNOSIS — M19042 Primary osteoarthritis, left hand: Secondary | ICD-10-CM | POA: Insufficient documentation

## 2022-05-11 DIAGNOSIS — M19041 Primary osteoarthritis, right hand: Secondary | ICD-10-CM | POA: Diagnosis not present

## 2022-05-11 DIAGNOSIS — Z905 Acquired absence of kidney: Secondary | ICD-10-CM | POA: Diagnosis not present

## 2022-05-11 DIAGNOSIS — M79642 Pain in left hand: Secondary | ICD-10-CM

## 2022-05-11 DIAGNOSIS — Z79899 Other long term (current) drug therapy: Secondary | ICD-10-CM | POA: Diagnosis not present

## 2022-05-11 DIAGNOSIS — M79641 Pain in right hand: Secondary | ICD-10-CM | POA: Insufficient documentation

## 2022-05-11 DIAGNOSIS — G4486 Cervicogenic headache: Secondary | ICD-10-CM | POA: Diagnosis not present

## 2022-05-11 DIAGNOSIS — M25511 Pain in right shoulder: Secondary | ICD-10-CM | POA: Insufficient documentation

## 2022-05-11 DIAGNOSIS — R768 Other specified abnormal immunological findings in serum: Secondary | ICD-10-CM | POA: Diagnosis not present

## 2022-05-11 DIAGNOSIS — M2550 Pain in unspecified joint: Secondary | ICD-10-CM | POA: Diagnosis not present

## 2022-05-12 ENCOUNTER — Other Ambulatory Visit (HOSPITAL_COMMUNITY)
Admission: RE | Admit: 2022-05-12 | Discharge: 2022-05-12 | Disposition: A | Discharge status: Home / Self Care | Payer: Medicaid Other | Source: Ambulatory Visit | Attending: Internal Medicine | Admitting: Internal Medicine

## 2022-05-12 DIAGNOSIS — R768 Other specified abnormal immunological findings in serum: Secondary | ICD-10-CM | POA: Insufficient documentation

## 2022-05-13 ENCOUNTER — Encounter: Payer: Self-pay | Admitting: Physician Assistant

## 2022-05-13 ENCOUNTER — Ambulatory Visit: Payer: Medicaid Other | Admitting: Physician Assistant

## 2022-05-13 VITALS — BP 108/68 | HR 111 | Temp 97.7°F | Wt 198.0 lb

## 2022-05-13 DIAGNOSIS — F489 Nonpsychotic mental disorder, unspecified: Secondary | ICD-10-CM

## 2022-05-13 DIAGNOSIS — G8929 Other chronic pain: Secondary | ICD-10-CM

## 2022-05-13 LAB — C3 COMPLEMENT: C3 Complement: 167 mg/dL (ref 82–167)

## 2022-05-13 LAB — CYCLIC CITRUL PEPTIDE ANTIBODY, IGG/IGA: CCP Antibodies IgG/IgA: 1 units (ref 0–19)

## 2022-05-13 LAB — C4 COMPLEMENT: Complement C4, Body Fluid: 32 mg/dL (ref 12–38)
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
## 2022-05-14 LAB — ANCA TITERS
Atypical P-ANCA titer: <1:20 {titer}
C-ANCA: <1:20 {titer}
P-ANCA: <1:20 {titer}

## 2022-05-18 NOTE — Progress Notes (Deleted)
Office Visit Note  Patient: Briana Wyatt             Date of Birth: [birth year]           MRN: [id number]             PCP: Soyla Dryer, PA-C Referring: Soyla Dryer, PA-C Visit Date: 06/01/2022   Subjective:  No chief complaint on file.   History of Present Illness: Briana Wyatt is a 51 y.o. female here for follow up for evaluation of low positive ANCA with MPO Abs   Previous HPI 05/11/2022 Briana Wyatt is a 51 y.o. female here for evaluation of low positive ANCA with MPO Abs. Autoimmune lab workup has been in the setting of numerous symptoms including headaches, fatigue, paresthesias, and joint pain in numerous areas. Imaging consistent with brain white matter lesions no spinal lesions but does have severe stenosis.  Her biggest complaint in particular has been pain stiffness numbness and swelling affecting both of her hands.  She notices symptoms coming and going throughout the day no specific time activity or position.  When she sees swelling it is pretty diffuse throughout the hand without any discoloration.  She is having a persistent numbness issues on the right hand affecting the fourth and fifth finger and part of the third finger.  She thinks this has been somewhat progressive over time.  She also has a lot of stiffness or pain up at the right shoulder or right side of her neck.  She does not take many OTC medications due to having only 1 kidney.  She is prescribed gabapentin 900 mg twice daily, duloxetine 30 mg twice daily, and tizanidine 2 mg at bedtime. She did have a previous arthroscopic repair of right shoulder rotator cuff tear.  She has had bilateral carpal tunnel release surgery with good resolution of symptoms.  She had a repeated nerve conduction study in June to assess this which demonstrated mild median nerve impingement but no other specific lesions identified.  MRI imaging of the brain showed some nonspecific white matter lesions.  There were no  lesions consistent with MS in the spine imaging but does have significant multilevel cervical spine disease with spinal stenosis.   Activities of Daily Living:  Patient reports morning stiffness for 30-60 minutes.   Patient Reports nocturnal pain.  Difficulty dressing/grooming: Denies Difficulty climbing stairs: Reports Difficulty getting out of chair: Reports Difficulty using hands for taps, buttons, cutlery, and/or writing: Reports   No Rheumatology ROS completed.   PMFS History:  Patient Active Problem List   Diagnosis Date Noted   Abnormal ANCA test 05/11/2022   Cervicogenic headache 05/11/2022   Bilateral hand pain 05/11/2022   Barrett's esophagus without dysplasia 05/04/2022   Gastroesophageal reflux disease without esophagitis 05/04/2022   MDD (major depressive disorder), recurrent severe, without psychosis ([place]) 03/16/2022   GAD (generalized anxiety disorder) 03/12/2022   PTSD (post-traumatic stress disorder) 03/12/2022   Intermittent explosive disorder 03/12/2022   Stimulant use disorder 03/12/2022   Abdominal pain, epigastric 02/19/2022   Hemorrhoids 02/19/2022   Nausea without vomiting 02/19/2022   Encounter for screening colonoscopy 02/19/2022   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   Cocaine-induced mood disorder ([place])     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   Hypertension    Hypothyroidism  Kidney disease, chronic, stage III (moderate, EGFR 30-59 ml/min) (HCC)    Movement disorder    PTSD (post-traumatic stress disorder)    Restless leg syndrome    Sciatica of right side    Spondylosis    Cervical and lumbar   Tortuous colon     Family History  Problem Relation Age of Onset   Hypertension Father    Stroke Father    Hypertension Sister    Dystonia Sister    Past Surgical History:  Procedure  Laterality Date   BIOPSY  04/29/2022   Procedure: BIOPSY;  Surgeon: Harvel Quale, MD;  [facility];  Service: Gastroenterology;;   CARPAL TUNNEL RELEASE Bilateral    COLONOSCOPY WITH PROPOFOL N/A 04/29/2022   Procedure: COLONOSCOPY WITH PROPOFOL;  Surgeon: Harvel Quale, MD;  Location: [facility];  Service: Gastroenterology;  Laterality: N/A;  145   ECTOPIC PREGNANCY SURGERY     ESOPHAGOGASTRODUODENOSCOPY (EGD) WITH PROPOFOL N/A 04/29/2022   Procedure: ESOPHAGOGASTRODUODENOSCOPY (EGD) WITH PROPOFOL;  Surgeon: Harvel Quale, MD;  Location: [facility];  Service: Gastroenterology;  Laterality: N/A;   SHOULDER SURGERY Right    SHOULDER SURGERY Right    TUBAL LIGATION     Social History   Social History Narrative   Not on file   Immunization History  Administered Date(s) Administered   Moderna SARS-COV2 Booster Vaccination 06/27/2020   Moderna Sars-Covid-2 Vaccination 12/14/2019, 01/18/2020   Tdap 04/18/2018     Objective: Vital Signs: There were no vitals taken for this visit.   Physical Exam   Musculoskeletal Exam: ***  CDAI Exam: CDAI Score: -- Patient Global: --; Provider Global: -- Swollen: --; Tender: -- Joint Exam 06/01/2022   No joint exam has been documented for this visit   There is currently no information documented on the homunculus. Go to the Rheumatology activity and complete the homunculus joint exam.  Investigation: No additional findings.  Imaging: XR Hand 2 View Left  Result Date: 05/11/2022 X-ray left hand 2 views Radiocarpal joint space appears normal.  Chronic appearing changes and ulnocarpal joint space and ulnar styloid process.  Moderate first CMC joint osteophyte formation.  MCP and PIP joint spaces appear normal.  Mild DIP joint osteoarthritis with very early osteophytes worst at fifth finger.  No erosions or abnormal calcifications seen.  Bone mineralization appears normal. Impression  Osteoarthritis changes most advanced at the first [facility] joint lesser at DIPs  XR Hand 2 View Right  Result Date: 05/11/2022 X-ray right hand 2 views Radiocarpal joint space appears normal.  There are mild degenerative changes of the first [facility] joint with several subchondral cysts.  MCP joint space appears normal.  PIP joint space appears normal.  There are some cystic appearing changes at multiple DIP joints and periarticular calcification at the second DIP.  No erosions seen.  Bone mineralization appears normal. Impression Degenerative appearing arthritis changes most at first [facility] and in DIP joints   Recent Labs: Lab Results  Component Value Date   WBC 12.0 (H) 04/28/2022   HGB 12.8 04/28/2022   PLT 280 04/28/2022   NA 137 04/28/2022   K 4.5 04/28/2022   CL 105 04/28/2022   CO2 25 04/28/2022   GLUCOSE 111 (H) 04/28/2022   BUN 31 (H) 04/28/2022   CREATININE 1.00 04/28/2022   BILITOT 0.5 03/12/2022   ALKPHOS 87 03/12/2022   AST 26 03/12/2022   ALT 29 03/12/2022   PROT 7.2 03/12/2022   ALBUMIN 3.9 03/12/2022  CALCIUM 8.7 (L) 04/28/2022    Speciality Comments: No specialty comments available.  Procedures:  No procedures performed Allergies: Codeine, Methocarbamol, and Nsaids   Assessment / Plan:     Visit Diagnoses: No diagnosis found.  ***  Orders: No orders of the defined types were placed in this encounter.  No orders of the defined types were placed in this encounter.    Follow-Up Instructions: No follow-ups on file.   Bertram Savin, RT  Note - This record has been created using Editor, commissioning.  Chart creation errors have been sought, but may not always  have been located. Such creation errors do not reflect on  the standard of medical care.

## 2022-05-19 NOTE — Progress Notes (Signed)
Lab results are all negative for evidence of rheumatoid arthritis or vasculitis. The previous abnormal antibody test is now normal. Hand xrays show osteoarthritis but nothing concerning for inflammatory disease. I don't think we need to keep scheduled follow up since testing is negative. With her neck pain, headaches, and numbness I think she needs to follow up with her neurosurgeon or spine specialist.

## 2022-05-21 ENCOUNTER — Ambulatory Visit (HOSPITAL_COMMUNITY)
Admission: RE | Admit: 2022-05-21 | Discharge: 2022-05-21 | Disposition: A | Discharge status: Home / Self Care | Payer: Medicaid Other | Source: Ambulatory Visit | Attending: Gastroenterology | Admitting: Gastroenterology

## 2022-05-21 ENCOUNTER — Encounter (HOSPITAL_COMMUNITY): Payer: Self-pay

## 2022-05-21 DIAGNOSIS — R112 Nausea with vomiting, unspecified: Secondary | ICD-10-CM | POA: Insufficient documentation

## 2022-05-21 DIAGNOSIS — R1013 Epigastric pain: Secondary | ICD-10-CM | POA: Insufficient documentation

## 2022-05-21 DIAGNOSIS — R110 Nausea: Secondary | ICD-10-CM | POA: Insufficient documentation

## 2022-05-21 MED ORDER — IOHEXOL 300 MG/ML  SOLN
100.0000 mL | Freq: Once | INTRAMUSCULAR | Status: AC | PRN
  Administered 2022-05-21: 100 mL via INTRAVENOUS

## 2022-05-24 ENCOUNTER — Encounter: Payer: Self-pay | Admitting: Physician Assistant

## 2022-05-25 ENCOUNTER — Telehealth: Payer: Self-pay | Admitting: Orthopedic Surgery

## 2022-05-25 NOTE — Telephone Encounter (Signed)
Patient called to mention a right shoulder pain issue; relayed a new problem of right shoulder pain. Discussed appointment - patient said she is needing to re-apply for her [place] discount. Michela Pitcher will call when approved to schedule.

## 2022-05-27 ENCOUNTER — Encounter (INDEPENDENT_AMBULATORY_CARE_PROVIDER_SITE_OTHER): Payer: Self-pay

## 2022-06-01 ENCOUNTER — Ambulatory Visit: Payer: Medicaid Other | Admitting: Internal Medicine

## 2022-06-01 DIAGNOSIS — M79641 Pain in right hand: Secondary | ICD-10-CM

## 2022-06-01 DIAGNOSIS — R768 Other specified abnormal immunological findings in serum: Secondary | ICD-10-CM

## 2022-06-01 DIAGNOSIS — G4486 Cervicogenic headache: Secondary | ICD-10-CM

## 2022-06-10 ENCOUNTER — Other Ambulatory Visit: Payer: Self-pay | Admitting: Physician Assistant

## 2022-06-10 ENCOUNTER — Ambulatory Visit: Payer: Medicaid Other | Admitting: Physician Assistant

## 2022-06-10 ENCOUNTER — Encounter: Payer: Self-pay | Admitting: Physician Assistant

## 2022-06-10 VITALS — BP 136/70 | HR 51 | Temp 96.9°F | Wt 202.0 lb

## 2022-06-10 DIAGNOSIS — N951 Menopausal and female climacteric states: Secondary | ICD-10-CM

## 2022-06-10 DIAGNOSIS — M79672 Pain in left foot: Secondary | ICD-10-CM

## 2022-06-10 DIAGNOSIS — F489 Nonpsychotic mental disorder, unspecified: Secondary | ICD-10-CM

## 2022-06-10 MED ORDER — PREMPRO 0.3-1.5 MG PO TABS: 1.0000 | ORAL_TABLET | Freq: Every day | ORAL | 1 refills | Status: DC

## 2022-06-10 NOTE — Patient Instructions (Addendum)
[facility]=    [phone] Reschedule appointment from June for pain   Stop the potassium since you stopped the furosemide

## 2022-06-10 NOTE — Progress Notes (Unsigned)
BP 136/70   Pulse (!) 51   Temp (!) 96.9 F (36.1 C)   Wt 202 lb (91.6 kg)   SpO2 96%   BMI 33.10 kg/m    Subjective:    Patient ID: Briana Wyatt, female    DOB: [birth year], 51 y.o.   MRN: [id number]  HPI: Briana Wyatt is a 51 y.o. female presenting on 06/10/2022 for No chief complaint on file.   HPI  Pt is in today to discuss HRT.   She has significant problems with being hot  and having hot flashes.  She says she was on estradiol in the past.  She has a uterus.  LMP January 2021  She does not smoke, she has never had a stroke.   Her older sister has had several blood clots and has had a bunch of strokes.  There is No family history of breast cancer.   She was on estradiol in the past.   She was on it for about 6 months.  Stopped it about April last year.    2.  Pt Wants more referrals to more specialists She says she wants to see gyn because she has fibroids.   She had CT done in September that showed fibroids.    She says they cause her pain.   Pt has not mentioned this in the past.   3. Pt says she wants to go to the Spine doctor because her back hurts  4. Pt says she wants to go to the Dentist  5. Pt wants to go to ENT for she thinks she has a sinus issue  6. Pt wants to go see a Podiatrist for her left foot hurts.  She says she popped it a couple years ago and it sometimes hurts.  She does not report any recent injury.      Relevant past medical, surgical, family and social history reviewed and updated as indicated. Interim medical history since our last visit reviewed. Allergies and medications reviewed and updated.   Current Outpatient Medications:    acetaminophen (TYLENOL) 650 MG CR tablet, Take 650 mg by mouth every 8 (eight) hours as needed for pain or fever., Disp: , Rfl:    albuterol (VENTOLIN HFA) 108 (90 Base) MCG/ACT inhaler, Inhale 1-2 puffs into the lungs every 4 (four) hours as needed for wheezing or shortness of breath., Disp: , Rfl:     Calcium Carbonate-Vitamin D (CALTRATE 600+D PO), Take by mouth., Disp: , Rfl:    cetirizine (ZYRTEC) 10 MG tablet, Take 10 mg by mouth daily., Disp: , Rfl:    Cholecalciferol (VITAMIN D-3 PO), Take 1 tablet by mouth daily., Disp: , Rfl:    Erenumab-aooe (AIMOVIG, 140 MG DOSE, [place]), One a month, Disp: , Rfl:    furosemide (LASIX) 20 MG tablet, TAKE 1 TABLET(20 MG) BY MOUTH DAILY, Disp: 14 tablet, Rfl: 0   gabapentin (NEURONTIN) 300 MG capsule, Take 3 capsules (900 mg total) by mouth 2 (two) times daily., Disp: 180 capsule, Rfl: 0   hydrOXYzine (ATARAX) 25 MG tablet, Take by mouth., Disp: , Rfl:    losartan (COZAAR) 50 MG tablet, TAKE 1 Tablet BY MOUTH ONCE EVERY DAY, Disp: 90 tablet, Rfl: 1   Multiple Vitamins-Minerals (CENTRUM SILVER PO), Take 1 tablet by mouth daily., Disp: , Rfl:    omeprazole (PRILOSEC) 40 MG capsule, TAKE 1 Capsule BY MOUTH ONCE EVERY DAY, Disp: 90 capsule, Rfl: 1   phenylephrine-shark liver oil-mineral oil-petrolatum (PREPARATION H) 0.25-14-74.9 % rectal ointment,  Place 1 Application rectally 2 (two) times daily as needed for hemorrhoids., Disp: , Rfl:    potassium chloride (KLOR-CON M) 10 MEQ tablet, Take 1 tablet (10 mEq total) by mouth every other day., Disp: 90 tablet, Rfl: 0   rizatriptan (MAXALT) 10 MG tablet, Take 10 mg by mouth as needed for migraine., Disp: , Rfl:    tiZANidine (ZANAFLEX) 2 MG tablet, Take 2 mg by mouth 2 (two) times daily., Disp: , Rfl:    DULoxetine (CYMBALTA) 30 MG capsule, Take 1 capsule (30 mg total) by mouth 2 (two) times daily., Disp: 60 capsule, Rfl: 0   topiramate (TOPAMAX) 25 MG tablet, Take 1 tablet (25 mg total) by mouth 2 (two) times daily., Disp: 60 tablet, Rfl: 0   Review of Systems  Per HPI unless specifically indicated above     Objective:    BP 136/70   Pulse (!) 51   Temp (!) 96.9 F (36.1 C)   Wt 202 lb (91.6 kg)   SpO2 96%   BMI 33.10 kg/m   Wt Readings from Last 3 Encounters:  06/10/22 202 lb (91.6 kg)   05/13/22 198 lb (89.8 kg)  05/11/22 198 lb 6.4 oz (90 kg)    Physical Exam Vitals reviewed.  Constitutional:      General: She is not in acute distress.    Appearance: She is well-developed. She is obese. She is not toxic-appearing.  HENT:     Head: Normocephalic and atraumatic.  Cardiovascular:     Rate and Rhythm: Normal rate and regular rhythm.  Pulmonary:     Effort: Pulmonary effort is normal.     Breath sounds: Normal breath sounds.  Abdominal:     General: Bowel sounds are normal.     Palpations: Abdomen is soft. There is no mass.     Tenderness: There is no abdominal tenderness. There is no guarding or rebound.  Musculoskeletal:     Cervical back: Neck supple.     Right lower leg: No edema.     Left lower leg: No edema.     Left foot: Normal range of motion and normal capillary refill. Tenderness present. No swelling, deformity or bony tenderness. Normal pulse.     Comments: Pt responds with reaction of significant pain with even light touch over entire left foot and ankle.    Lymphadenopathy:     Cervical: No cervical adenopathy.  Skin:    General: Skin is warm and dry.  Neurological:     Mental Status: She is alert and oriented to person, place, and time.  Psychiatric:        Behavior: Behavior normal.           Assessment & Plan:   Encounter Diagnoses  Name Primary?   Symptomatic menopausal or female climacteric states Yes   Left foot pain    Mental health problem       HRT.  After a long discussion with pt about risks & benefits of HRT  including increased risk of clot and stroke, she wants to go on it.  -rx prempro  2  gyn-   pt had appt to see gyn in June-   appt was cancelled-  Note in epic says " (CANCELED NOT ABOUT TO PAY FOR VISIT [facility] DOESN'T HAVE THE ISSUES)"    pt is given the number to call to reschedule  3. Spine doctor- Discussed with pt that she has already been sent to spine doctor.  She saw orthopedist  04/14/22 and notes given  diagnosis lumbar pain   4. Dentist-  pt is Already on dental list  5. Podiatrist-  pt foot pain is atypical with tenderness over entire foot and ankle, even with just mild touch.  Will obtain xray    Pt is instructed to Stop K+ since she stopped the lasix   As appointment was ending, pt says she wants to go off her gabapentin so she can take pregabalin.  Discussed with pt that this office didn't prescribe the medication and it was prescribed for her [place] issues.  Also as it's almost 2pm (her appointment was at 1) that there is no more time left today.  We can discuss further at her next appointment.    Pt is scheduled to follow up 1 month

## 2022-06-12 ENCOUNTER — Ambulatory Visit (HOSPITAL_COMMUNITY)
Admission: RE | Admit: 2022-06-12 | Discharge: 2022-06-12 | Disposition: A | Discharge status: Home / Self Care | Payer: Medicaid Other | Source: Ambulatory Visit | Attending: Physician Assistant | Admitting: Physician Assistant

## 2022-06-12 DIAGNOSIS — M79672 Pain in left foot: Secondary | ICD-10-CM | POA: Insufficient documentation

## 2022-06-13 ENCOUNTER — Encounter: Payer: Self-pay | Admitting: Internal Medicine

## 2022-06-13 ENCOUNTER — Encounter: Payer: Self-pay | Admitting: Nurse Practitioner

## 2022-06-13 DIAGNOSIS — R7989 Other specified abnormal findings of blood chemistry: Secondary | ICD-10-CM

## 2022-06-16 ENCOUNTER — Encounter: Payer: Self-pay | Admitting: Physician Assistant

## 2022-06-19 NOTE — Telephone Encounter (Signed)
Patient has cervical spine degenerative arthritis which I suspect this is causing nerve impingement. Recommended referral to spine specialist or neurosurgery for this whoever most available with [employer] plan.

## 2022-06-24 ENCOUNTER — Ambulatory Visit: Payer: Medicaid Other | Admitting: Nurse Practitioner

## 2022-06-25 ENCOUNTER — Telehealth: Payer: Self-pay

## 2022-06-25 NOTE — Telephone Encounter (Signed)
Patient called to schedule an appointment with our office, but she has had treatment somewhere in [place]. I told her that she needs  to get notes,xrays, etc so the doctor can review them before scheduling appointment.

## 2022-06-26 ENCOUNTER — Ambulatory Visit (INDEPENDENT_AMBULATORY_CARE_PROVIDER_SITE_OTHER): Payer: Self-pay | Admitting: Orthopedic Surgery

## 2022-06-26 ENCOUNTER — Encounter: Payer: Self-pay | Admitting: Orthopedic Surgery

## 2022-06-26 DIAGNOSIS — G8929 Other chronic pain: Secondary | ICD-10-CM

## 2022-06-26 DIAGNOSIS — M25511 Pain in right shoulder: Secondary | ICD-10-CM

## 2022-06-26 NOTE — Patient Instructions (Signed)

## 2022-06-27 NOTE — Progress Notes (Signed)
Orthopaedic Clinic Return  Assessment: Marajade Lei is a 51 y.o. female with the following: Chronic right shoulder pain   Plan: Mrs. Siebel continues to have right shoulder pain.  She has a history of 2 rotator cuff repairs, with poor results.  Prior steroid injection did provide some relief of her symptoms.  She has seen a neurosurgeon about her cervical spine, and despite the MRI findings, she is not a good candidate for surgery.  It is unclear how much of her current pain is a result of the prior rotator cuff repair, or the findings on the MRI.  This was discussed with the patient.  She states understanding.  She will follow-up as needed.  Procedure note injection - Right shoulder    Verbal consent was obtained to inject the right shoulder, subacromial space Timeout was completed to confirm the site of injection.   The skin was prepped with alcohol and ethyl chloride was sprayed at the injection site.  A 21-gauge needle was used to inject 40 mg of Depo-Medrol and 1% lidocaine (3 cc) into the subacromial space of the right shoulder using a posterolateral approach.  There were no complications.  A sterile bandage was applied.     Follow-up: Return if symptoms worsen or fail to improve.   Subjective:  Chief Complaint  Patient presents with   Shoulder Pain    RT shoulder pain/ sometimes has numbness radiating into fingers    History of Present Illness: Katrin Grabel is a 51 y.o. female who returns to clinic for repeat evaluation of right shoulder pain.  Briefly, she has had 2 arthroscopic right shoulder rotator cuff repairs, and has done poorly following both.  She has seen a neurosurgeon for her cervical spine.  There is some evidence of compression on the MRI, but she is not a candidate for surgery.  Continues to have pain throughout the anterior and lateral aspect of the right shoulder.  Difficulty with overhead motion.  She did have some improvement with prior steroid  injection.  Review of Systems: No fevers or chills No numbness or tingling No chest pain No shortness of breath No bowel or bladder dysfunction No GI distress No headaches   Objective: There were no vitals taken for this visit.  Physical Exam:  Right shoulder with well-healed surgical incisions.  Active forward flexion limited to 90 degrees.  Abduction at her side to 85 degrees.  Tenderness palpation of the medial epicondyle, as well as within the cubital tunnel.  Positive Tinel's at the cubital tunnel.  Decreased sensation to the small finger.  Grip strength is 4+/5.  Restricted range of motion, especially rotation of her neck to the right.  Tenderness to palpation within the right side cervical muscles.  Tenderness to palpation within the trapezius.  2+ radial pulse.  IMAGING: I personally ordered and reviewed the following images:  No new imaging obtained today.  Mordecai Rasmussen, MD 06/27/2022 10:12 PM

## 2022-07-06 ENCOUNTER — Encounter (INDEPENDENT_AMBULATORY_CARE_PROVIDER_SITE_OTHER): Payer: Self-pay

## 2022-07-06 ENCOUNTER — Ambulatory Visit (INDEPENDENT_AMBULATORY_CARE_PROVIDER_SITE_OTHER): Payer: Medicaid Other | Admitting: Gastroenterology

## 2022-07-07 ENCOUNTER — Ambulatory Visit: Payer: Medicaid Other | Admitting: Nurse Practitioner

## 2022-07-07 ENCOUNTER — Ambulatory Visit: Payer: Medicaid Other | Admitting: Physician Assistant

## 2022-07-08 LAB — T4, FREE: Free T4: 1.01 ng/dL (ref 0.82–1.77)

## 2022-07-08 LAB — TSH: TSH: 0.827 u[IU]/mL (ref 0.450–4.500)

## 2022-07-08 LAB — T3, FREE: T3, Free: 2.9 pg/mL (ref 2.0–4.4)

## 2022-07-13 ENCOUNTER — Encounter (HOSPITAL_COMMUNITY): Payer: Self-pay

## 2022-07-13 NOTE — Therapy (Signed)
PHYSICAL THERAPY DISCHARGE SUMMARY  Visits from Start of Care: 5  Current functional level related to goals / functional outcomes: NA   Remaining deficits: NA   Education / Equipment: NA   Patient agrees to discharge. Patient goals were not met. Patient is being discharged due to not returning since the last visit.  Patient did not return for any follow up appointments after 01/20/22  

## 2022-07-13 NOTE — Progress Notes (Unsigned)
GI Office Note    Referring Provider: No ref. provider found Primary Care Physician:  Patient, No Pcp Per Primary Gastroenterologist: Harvel Quale, MD  Date:  07/14/2022  ID:  Briana Wyatt, DOB 1971-04-20, MRN [id number]   Chief Complaint   Chief Complaint  Patient presents with   Abdominal Pain    Upper stomach pain, stools are black and hard. Lots of gas all the time.     History of Present Illness  Briana Wyatt is a 51 y.o. female with a history of reflux, craniofacial hyperhidrosis, HTN, restless leg, hemorrhoids presenting today for follow up.  (Previously seen in June reporting large hemorrhoids, large painful passage of stools with multiple itchy hematochezia.  Hemoglobin stable on oral iron.  Recommend to stop iron pills and scheduled for EGD and colonoscopy.  Start MiraLAX and use Anusol 3 times daily)  Last office visit 05/04/2022.  Recent hemoglobin stable at 12.8.  Reported continued lower abdominal/groin pain present for many years.  Has had an unremarkable GYN work-up.  Denied urinary symptoms and reported laying in fetal position to help improve pains.  Pain does not improve with BMs.  Denied constipation, reported stool softer since colonoscopy with about 2-3 bowel movements daily.  Lots of flatulence.  Hemorrhoids present but denied any itching, burning, or pain.  Weight Loss since Prior Visit.  Continued Nausea Possibly Related to Migraines.  GERD Controlled with Omeprazole 40 Mg Daily without Breakthrough.  Reported some dysphagia likely related to her dentures and feel like food stuck in her sternal notch.  Denies any food impactions.  Advise, gas producing foods, continue omeprazole 40 mg daily, and use over-the-counter Phazyme or Gas-X for flatulence.  Advised to follow-up in 1 year.  Colonoscopy 04/29/2022: -2 mm polyp in the transverse colon - hyperplastic -Tortuous colon -Nonbleeding external and internal hemorrhoids -Recommended CT A/P to  further evaluate abdominal pain -Repeat colonoscopy in 10 years  EGD 04/29/2022: -Esophageal mucosal changes suspicious for Barrett's (pathology with BE w/o dysplasia) -2 cm hiatal hernia -GE flap Hill grade 2 -Erosive gastropathy with no recent bleeding s/p biopsy - normal -Normal duodenum -Repeat EGD in 5 years  CT A/P 05/21/2022: Diffuse hepatic steatosis (NASH), unremarkable gallbladder, pancreas, spleen, adrenals.  Moderate volume of formed stool throughout the colon may reflect constipation.  Uterine leiomyomas nabothian cyst in the cervix, no suspicious mass. (Per result note uterine fibroids could be contributing to abdominal pain, recommended follow-up with OB/GYN.  Provided Mediterranean diet handout to assist with management of hepatic steatosis).   Today: Having some abdominal bloating as well as some upper abdominal pain. Also having a lot of flatulence and foul smelling. Also reporting black stools. Was not having black stools prior to her prior EGD. Last BM was a couple days ago. Has not tried anything in the past for constipation. Having varied stools consistencies and not able to strain anymore (weak pelvic floor). Sometimes stools are bigger and then taper out and then have looser stools. Has to wait to use the bathroom until she has a big urge to go. Has incomplete emptying as well. Started taking oil of oregano. Denies frequent NSAID use, alcohol use, tobacco use. Has been taking Pepto bismol 3 times per day for gas/bloating.   Lives in a camper, does not have a great diet. Does not eat, does not have much of an appetite. Breakfast she may have a protein shake, coffee, or a banana. Working on trying to lose weight. Usually has at least one good  balanced meal per day. (Last night she had shredded chicken with salsa and sauteed veggies with soft tacos). Has dentures but still has issues with chewing. Has been eating a lot of peanut butter as snacks throughout the day. Drinks water  all day, usually powerade and propel as well.   Denies frequent break though symptoms of heartburn. Previously took pantoprazole and has been controlled since switching to omeprazole.    Current Outpatient Medications  Medication Sig Dispense Refill   acetaminophen (TYLENOL) 650 MG CR tablet Take 650 mg by mouth every 8 (eight) hours as needed for pain or fever.     albuterol (VENTOLIN HFA) 108 (90 Base) MCG/ACT inhaler Inhale 1-2 puffs into the lungs every 4 (four) hours as needed for wheezing or shortness of breath.     Apple Cider Vinegar 500 MG TABS      ARIPiprazole (ABILIFY) 2 MG tablet Take 2 mg by mouth daily.     Ascorbic Acid (VITAMIN C) 500 MG CHEW      Calcium Carbonate-Vitamin D (CALTRATE 600+D PO) Take by mouth.     cetirizine (ZYRTEC) 10 MG tablet Take 10 mg by mouth daily.     Cholecalciferol (VITAMIN D-3 PO) Take 1 tablet by mouth daily.     DULoxetine (CYMBALTA) 30 MG capsule Take 1 capsule (30 mg total) by mouth 2 (two) times daily. 60 capsule 0   Erenumab-aooe (AIMOVIG, 140 MG DOSE, Comfrey) One a month     gabapentin (NEURONTIN) 300 MG capsule Take 3 capsules (900 mg total) by mouth 2 (two) times daily. 180 capsule 0   hydrOXYzine (ATARAX) 25 MG tablet Take by mouth.     linaclotide (LINZESS) 145 MCG CAPS capsule Take 1 capsule (145 mcg total) by mouth daily before breakfast. 30 capsule 2   losartan (COZAAR) 50 MG tablet TAKE 1 Tablet BY MOUTH ONCE EVERY DAY 90 tablet 1   Multiple Vitamins-Minerals (CENTRUM SILVER PO) Take 1 tablet by mouth daily.     omeprazole (PRILOSEC) 40 MG capsule TAKE 1 Capsule BY MOUTH ONCE EVERY DAY 90 capsule 1   rizatriptan (MAXALT) 10 MG tablet Take 10 mg by mouth as needed for migraine.     Simethicone 125 MG TABS Take 1 tablet (125 mg total) by mouth 2 (two) times daily as needed. 60 tablet 1   tiZANidine (ZANAFLEX) 2 MG tablet Take 2 mg by mouth 2 (two) times daily.     No current facility-administered medications for this visit.    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   Hypertension    Hypothyroidism    Kidney disease, chronic, stage III (moderate, EGFR 30-59 ml/min) (HCC)    Movement disorder    PTSD (post-traumatic stress disorder)    Restless leg syndrome    Sciatica of right side    Spondylosis    Cervical and lumbar   Tortuous colon     Past Surgical History:  Procedure Laterality Date   BIOPSY  04/29/2022   Procedure: BIOPSY;  Surgeon: Harvel Quale, MD;  Location: [facility];  Service: Gastroenterology;;   Wilmon Pali RELEASE Bilateral    COLONOSCOPY WITH PROPOFOL N/A 04/29/2022   Procedure: COLONOSCOPY WITH PROPOFOL;  Surgeon: Harvel Quale, MD;  Location: [facility];  Service: Gastroenterology;  Laterality: N/A;  145   ECTOPIC PREGNANCY SURGERY     ESOPHAGOGASTRODUODENOSCOPY (EGD) WITH PROPOFOL N/A 04/29/2022   Procedure: ESOPHAGOGASTRODUODENOSCOPY (EGD) WITH PROPOFOL;  Surgeon: Harvel Quale, MD;  Location: [facility];  Service: Gastroenterology;  Laterality: N/A;   SHOULDER SURGERY Right    SHOULDER SURGERY Right    TUBAL LIGATION      Family History  Problem Relation Age of Onset   Hypertension Father    Stroke Father    Hypertension Sister    Dystonia Sister     Allergies as of 07/14/2022 - Review Complete 07/14/2022  Allergen Reaction Noted   Codeine Nausea And Vomiting 05/26/2017   Methocarbamol Itching 05/26/2017   Nsaids  04/07/2019    Social History   Socioeconomic History   Marital status: Single    Spouse name: Not on file   Number of children: 3   Years of education: Not on file   Highest education level: GED or equivalent  Occupational History   Not on file  Tobacco Use   Smoking status: Former    Types: Cigarettes    Quit date: 08/25/1995    Years since quitting: 26.9     Passive exposure: Past   Smokeless tobacco: Never  Vaping Use   Vaping Use: Never used  Substance and Sexual Activity   Alcohol use: No   Drug use: Not Currently    Types: "Crack" cocaine    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   Sexual activity: Not Currently    Birth control/protection: Surgical  Other Topics Concern   Not on file  Social History Narrative   Not on file   Social Determinants of Health   Financial Resource Strain: Not on file  Food Insecurity: No Food Insecurity (11/06/2021)   Hunger Vital Sign    Worried About Running Out of Food in the Last Year: Never true    Ran Out of Food in the Last Year: Never true  Transportation Needs: No Transportation Needs (11/06/2021)   PRAPARE - Hydrologist (Medical): No    Lack of Transportation (Non-Medical): No  Physical Activity: Not on file  Stress: Not on file  Social Connections: Not on file     Review of Systems   Gen: Denies fever, chills, anorexia. Denies fatigue, weakness, weight loss.  CV: Denies chest pain, palpitations, syncope, peripheral edema, and claudication. Resp: Denies dyspnea at rest, cough, wheezing, coughing up blood, and pleurisy. GI: See HPI Derm: Denies rash, itching, dry skin Psych: Denies depression, anxiety, memory loss, confusion. No homicidal or suicidal ideation.  Heme: Denies bruising, bleeding, and enlarged lymph nodes.   Physical Exam   BP 127/79 (BP Location: Right Arm, Patient Position: Sitting, Cuff Size: Normal)   Pulse 90   Temp 97.9 F (36.6 C) (Temporal)   Ht _0  (1.651 m)   Wt 207 lb 6.4 oz (94.1 kg)   SpO2 97%   BMI 34.51 kg/m   General:   Alert and oriented. No distress noted. Pleasant and cooperative.  Head:  Normocephalic and atraumatic. Eyes:  Conjuctiva clear without scleral icterus. Mouth:  Oral mucosa pink and moist. Good dentition. No lesions. Lungs:  Clear to auscultation bilaterally. No wheezes,  rales, or rhonchi. No distress.  Heart:  S1, S2 present without murmurs appreciated.  Abdomen:  +BS, soft, mild distention.  Tenderness to epigastric region.  No rebound or guarding. No HSM or masses noted. Rectal: deferred Msk:  Symmetrical  without gross deformities. Normal posture. Extremities:  Without edema. Neurologic:  Alert and  oriented x4 Psych:  Alert and cooperative. Normal mood and affect.   Assessment  Briana Wyatt is a 51 y.o. female with a history of reflux, craniofacial hyperhidrosis, HTN, restless leg, hemorrhoids presenting today for follow up.  Constipation, abdominal pain, bloating: Previously seen regarding bloating and GERD earlier this year. Underwent colonoscopy with hyperplastic polyp removed and hemorrhoids present. Recent CT A/P with contrast in September revealing uterine fibroids and likely NASH. Fibroids likely cause of some RLQ discomfort.  Also with moderate volume of retained stool in the colon.  Likely could have component of IBS with predominant constipation.  Lower abdominal exam benign with active bowel sounds.  Constipation is likely contributing to some of her upper GI symptoms as well.  Has been having black stools secondary to 3 doses of Pepto-Bismol daily.  Advised patient to stop and monitor for ongoing black stools, she will call the office if it continues for further work-up (CBC, iFOBT).  We will proceed with a regular bowel regimen with Linzess 145 mcg daily, samples provided.  Also advised to begin increasing fiber in her diet and taking Benefiber 2-3 teaspoons daily.  Also discussed avoiding high gas producing foods and simethicone 125 mg once to twice daily as needed sent in for relief of flatulence and bloating.  GERD, Barrett's esophagus: Having ongoing upper abdominal pain. EGD in September with short segment barrett's esophagus and erosive gastropathy without recent bleeding.  Bloating as stated above.  Suspect a large part of her bloating and  some upper abdominal discomfort is related to her constipation as well as her reactive gastropathy.  She has maintained on omeprazole 40 mg daily and not having any typical breakthrough heartburn symptoms.  Melena as stated above, likely related to frequent Pepto-Bismol use.  For now she will continue daily PPI, we will consider increasing to twice daily if she continues to have significant epigastric pain unrelieved with dietary changes.   PLAN   Continue omeprazole 40 mg daily.  Benefiber 2-3 teaspoons daily. GERD diet.  Stop Pepto bismol- monitor for black stools Avoid high gas producing foods Simethicone 125 mg once to twice daily as needed.  Start Linzess 145 mcg daily, 30 minutes prior to breakfast. Samples provided today.  Follow up in 3 months     Venetia Night, MSN, FNP-BC, AGACNP-BC [facility]  I have reviewed the note and agree with the APP's assessment as described in this progress note  Maylon Peppers, MD Gastroenterology and [facility]

## 2022-07-14 ENCOUNTER — Ambulatory Visit (INDEPENDENT_AMBULATORY_CARE_PROVIDER_SITE_OTHER): Payer: Self-pay | Admitting: Gastroenterology

## 2022-07-14 ENCOUNTER — Telehealth: Payer: Self-pay | Admitting: *Deleted

## 2022-07-14 ENCOUNTER — Encounter: Payer: Self-pay | Admitting: Gastroenterology

## 2022-07-14 VITALS — BP 127/79 | HR 90 | Temp 97.9°F | Ht 65.0 in | Wt 207.4 lb

## 2022-07-14 DIAGNOSIS — R143 Flatulence: Secondary | ICD-10-CM

## 2022-07-14 DIAGNOSIS — R1013 Epigastric pain: Secondary | ICD-10-CM

## 2022-07-14 DIAGNOSIS — K219 Gastro-esophageal reflux disease without esophagitis: Secondary | ICD-10-CM

## 2022-07-14 DIAGNOSIS — K2270 Barrett's esophagus without dysplasia: Secondary | ICD-10-CM

## 2022-07-14 DIAGNOSIS — K5904 Chronic idiopathic constipation: Secondary | ICD-10-CM

## 2022-07-14 MED ORDER — LINACLOTIDE 145 MCG PO CAPS: 145.0000 ug | ORAL_CAPSULE | Freq: Every day | ORAL | 2 refills | Status: DC

## 2022-07-14 MED ORDER — SIMETHICONE 125 MG PO TABS: 125.0000 mg | ORAL_TABLET | Freq: Two times a day (BID) | ORAL | 1 refills | Status: DC | PRN

## 2022-07-14 NOTE — Patient Instructions (Addendum)
I am providing you with some samples of Linzess today. You will take 145 mcg daily on an empty stomach.   How to take Linzess: Once a day every day on empty stomach, at least 30 minutes before your first meal of the day. It is best to keep medications at a stable temperature Medication is best kept in its original bottle with the disket present.  It is a medication that is meant for everyday use and not to be used as needed.   What to expect: Constipation relief is typically felt in about 1 week Relief of abdominal pain, discomfort, and bloating begins in about 1 week with symptoms typically improving over 12 weeks and beyond. Diarrhea is most common side effect and typically begins within the first 2 weeks and can take 3-4 weeks to resolve It would be helpful to begin treatment over the weekend or when you can be closer to a bathroom   You can go to [email or web address] for patient support and sign up for daily medication reminders.   Stop taking Pepto-Bismol.  If you continue to have black stools shortness of breath, lightheadedness, dizziness, or bright red blood you should proceed to the ED. I have sent in a prescription for simethicone 125 mg for you to take once or twice daily as needed for gas/bloating.   To assist you in your constipation I want you to begin taking benefiber 2-3 teaspoons daily in 8 oz of water.   Avoid gas-producing foods (eg, garlic, cabbage, legumes, onions, broccoli, brussel sprouts, wheat, and potatoes).   Continue taking your omeprazole daily.   Follow a GERD diet:  Avoid fried, fatty, greasy, spicy, citrus foods. Avoid caffeine and carbonated beverages. Avoid chocolate. Try eating 4-6 small meals a day rather than 3 large meals. Do not eat within 3 hours of laying down. Prop head of bed up on wood or bricks to create a 6 inch incline.  Hope you have a great Thanksgiving!  It was a pleasure to see you today. I want to create trusting relationships  with patients. If you receive a survey regarding your visit,  I greatly appreciate you taking time to fill this out on paper or through your MyChart. I value your feedback.  Venetia Night, MSN, FNP-BC, AGACNP-BC [facility]

## 2022-07-14 NOTE — Telephone Encounter (Signed)
Pt called and states she took 2 Linzess 79mg this morning and an hour later she had diarrhea. She states she has had diarrhea all day. When she wiped she seen blood on toilet paper.She thinks it's her hemorrhoids. Pt states she will only take 1 Linzess 75m in the morning and see how that does. Informed to call office in a week with update.

## 2022-07-15 ENCOUNTER — Telehealth (INDEPENDENT_AMBULATORY_CARE_PROVIDER_SITE_OTHER): Payer: Self-pay | Admitting: *Deleted

## 2022-07-15 ENCOUNTER — Encounter (INDEPENDENT_AMBULATORY_CARE_PROVIDER_SITE_OTHER): Payer: Self-pay | Admitting: *Deleted

## 2022-07-15 NOTE — Telephone Encounter (Signed)
Patient seen yesterday and called today because she forgot to mention she has been having pain in right flank area and pain under right shoulder. She reports she has had right shoulder surgery twice and not sure if from that and also concerned because she has absent left kidney and worried something may be wrong with right kidney. No pain with urination, no fever. She reports pain is constant for years - a dull pain but at times can be sharp. I asked her to call pcp. She reports she is getting established next Tuesday with a new pcp at [facility]. I asked her to discuss with her pcp next Tuesday.

## 2022-07-20 NOTE — Telephone Encounter (Addendum)
Spoke to pt informed her of recommendations. She voiced understanding. She states her stomach is still hurting, but not as bad as it was. She states she is taking 1 Linzess 83mg before breakfast. She is still having some diarrhea.

## 2022-07-21 ENCOUNTER — Ambulatory Visit (INDEPENDENT_AMBULATORY_CARE_PROVIDER_SITE_OTHER): Payer: Self-pay | Admitting: Nurse Practitioner

## 2022-07-21 ENCOUNTER — Ambulatory Visit (INDEPENDENT_AMBULATORY_CARE_PROVIDER_SITE_OTHER): Payer: Self-pay | Admitting: Family Medicine

## 2022-07-21 ENCOUNTER — Encounter: Payer: Self-pay | Admitting: Nurse Practitioner

## 2022-07-21 ENCOUNTER — Encounter: Payer: Self-pay | Admitting: Family Medicine

## 2022-07-21 ENCOUNTER — Telehealth: Payer: Self-pay | Admitting: Family Medicine

## 2022-07-21 VITALS — BP 129/83 | HR 97 | Ht 65.0 in | Wt 200.2 lb

## 2022-07-21 VITALS — BP 130/84 | HR 87 | Ht 65.0 in | Wt 200.1 lb

## 2022-07-21 DIAGNOSIS — M25511 Pain in right shoulder: Secondary | ICD-10-CM

## 2022-07-21 DIAGNOSIS — Z114 Encounter for screening for human immunodeficiency virus [HIV]: Secondary | ICD-10-CM

## 2022-07-21 DIAGNOSIS — E7849 Other hyperlipidemia: Secondary | ICD-10-CM

## 2022-07-21 DIAGNOSIS — M47812 Spondylosis without myelopathy or radiculopathy, cervical region: Secondary | ICD-10-CM | POA: Insufficient documentation

## 2022-07-21 DIAGNOSIS — F411 Generalized anxiety disorder: Secondary | ICD-10-CM

## 2022-07-21 DIAGNOSIS — R7989 Other specified abnormal findings of blood chemistry: Secondary | ICD-10-CM

## 2022-07-21 DIAGNOSIS — E559 Vitamin D deficiency, unspecified: Secondary | ICD-10-CM

## 2022-07-21 DIAGNOSIS — R7301 Impaired fasting glucose: Secondary | ICD-10-CM

## 2022-07-21 DIAGNOSIS — M4802 Spinal stenosis, cervical region: Secondary | ICD-10-CM

## 2022-07-21 DIAGNOSIS — M797 Fibromyalgia: Secondary | ICD-10-CM | POA: Insufficient documentation

## 2022-07-21 DIAGNOSIS — H9312 Tinnitus, left ear: Secondary | ICD-10-CM | POA: Insufficient documentation

## 2022-07-21 DIAGNOSIS — E041 Nontoxic single thyroid nodule: Secondary | ICD-10-CM

## 2022-07-21 DIAGNOSIS — Z1159 Encounter for screening for other viral diseases: Secondary | ICD-10-CM

## 2022-07-21 DIAGNOSIS — G8929 Other chronic pain: Secondary | ICD-10-CM | POA: Insufficient documentation

## 2022-07-21 DIAGNOSIS — Z23 Encounter for immunization: Secondary | ICD-10-CM

## 2022-07-21 NOTE — Telephone Encounter (Signed)
n

## 2022-07-21 NOTE — Telephone Encounter (Signed)
Spoke to pt, informed her to call office in a week with an update on how the Linzess is working. She voiced understanding.

## 2022-07-21 NOTE — Patient Instructions (Signed)
I appreciate the opportunity to provide care to you today!    Follow up:  3 months  Labs: please stop by the lab during the week to get your blood drawn (CBC, CMP, Lipid profile, HgA1c, Vit D)  Screening: HIV and Hep C     Please continue to a heart-healthy diet and increase your physical activities. Try to exercise for 42mns at least three times a week.      It was a pleasure to see you and I look forward to continuing to work together on your health and well-being. Please do not hesitate to call the office if you need care or have questions about your care.   Have a wonderful day and week. With Gratitude, GAlvira MondayMSN, FNP-BC

## 2022-07-21 NOTE — Progress Notes (Signed)
07/21/2022     Endocrinology Consult Note    Subjective:    Patient ID: Briana Wyatt, female    DOB: 1971/07/24, PCP Patient, No Pcp Per.   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   Hypertension    Hypothyroidism    Kidney disease, chronic, stage III (moderate, EGFR 30-59 ml/min) (HCC)    Movement disorder    PTSD (post-traumatic stress disorder)    Restless leg syndrome    Sciatica of right side    Spondylosis    Cervical and lumbar   Tortuous colon     Past Surgical History:  Procedure Laterality Date   BIOPSY  04/29/2022   Procedure: BIOPSY;  Surgeon: Harvel Quale, MD;  [facility];  Service: Gastroenterology;;   Wilmon Pali RELEASE Bilateral    COLONOSCOPY WITH PROPOFOL N/A 04/29/2022   Procedure: COLONOSCOPY WITH PROPOFOL;  Surgeon: Harvel Quale, MD;  [facility];  Service: Gastroenterology;  Laterality: N/A;  145   ECTOPIC PREGNANCY SURGERY     ESOPHAGOGASTRODUODENOSCOPY (EGD) WITH PROPOFOL N/A 04/29/2022   Procedure: ESOPHAGOGASTRODUODENOSCOPY (EGD) WITH PROPOFOL;  Surgeon: Harvel Quale, MD;  [facility];  Service: Gastroenterology;  Laterality: N/A;   SHOULDER SURGERY Right    SHOULDER SURGERY Right    TUBAL LIGATION      Social History   Socioeconomic History   Marital status: Single    Spouse name: Not on file   Number of children: 3   Years of education: Not on file   Highest education level: GED or equivalent  Occupational History   Not on file  Tobacco Use   Smoking status: Former    Types: Cigarettes    Quit date: 08/25/1995    Years since quitting: 26.9    Passive exposure: Past   Smokeless tobacco: Never  Vaping Use   Vaping Use: Never used  Substance and Sexual Activity   Alcohol use: No   Drug use:  Not Currently    Types: "Crack" cocaine    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   Sexual activity: Not Currently    Birth control/protection: Surgical  Other Topics Concern   Not on file  Social History Narrative   Not on file   Social Determinants of Health   Financial Resource Strain: Not on file  Food Insecurity: No Food Insecurity (11/06/2021)   Hunger Vital Sign    Worried About Running Out of Food in the Last Year: Never true    Ran Out of Food in the Last Year: Never true  Transportation Needs: No Transportation Needs (11/06/2021)   PRAPARE - Hydrologist (Medical): No    Lack of Transportation (Non-Medical): No  Physical Activity: Not on file  Stress: Not on file  Social Connections: Not on file    Family History  Problem Relation Age of Onset   Hypertension Father    Stroke Father    Hypertension Sister    Dystonia Sister     Outpatient Encounter Medications as of 07/21/2022  Medication Sig   acetaminophen (TYLENOL) 650 MG CR tablet Take 650 mg by mouth every 8 (eight) hours as needed for pain or fever.   albuterol (VENTOLIN HFA) 108 (  90 Base) MCG/ACT inhaler Inhale 1-2 puffs into the lungs every 4 (four) hours as needed for wheezing or shortness of breath.   Apple Cider Vinegar 500 MG TABS    ARIPiprazole (ABILIFY) 2 MG tablet Take 2 mg by mouth daily.   Ascorbic Acid (VITAMIN C) 500 MG CHEW    Calcium Carbonate-Vitamin D (CALTRATE 600+D PO) Take by mouth.   cetirizine (ZYRTEC) 10 MG tablet Take 10 mg by mouth daily.   Cholecalciferol (VITAMIN D-3 PO) Take 1 tablet by mouth daily.   Erenumab-aooe (AIMOVIG, 140 MG DOSE, [place]) One a month   hydrOXYzine (ATARAX) 25 MG tablet Take by mouth.   linaclotide (LINZESS) 145 MCG CAPS capsule Take 1 capsule (145 mcg total) by mouth daily before breakfast.   losartan (COZAAR) 50 MG tablet TAKE 1 Tablet BY MOUTH ONCE EVERY DAY   Multiple Vitamins-Minerals (CENTRUM SILVER PO)  Take 1 tablet by mouth daily.   omeprazole (PRILOSEC) 40 MG capsule TAKE 1 Capsule BY MOUTH ONCE EVERY DAY   rizatriptan (MAXALT) 10 MG tablet Take 10 mg by mouth as needed for migraine.   tiZANidine (ZANAFLEX) 2 MG tablet Take 2 mg by mouth 2 (two) times daily.   DULoxetine (CYMBALTA) 30 MG capsule Take 1 capsule (30 mg total) by mouth 2 (two) times daily.   gabapentin (NEURONTIN) 300 MG capsule Take 3 capsules (900 mg total) by mouth 2 (two) times daily.   Simethicone 125 MG TABS Take 1 tablet (125 mg total) by mouth 2 (two) times daily as needed.   No facility-administered encounter medications on file as of 07/21/2022.    ALLERGIES: Allergies  Allergen Reactions   Codeine Nausea And Vomiting    High doses of codeine pt reports greater than 73m she can't tolerate    Methocarbamol Itching    "feels like bugs are crawling under skin"   Nsaids     Other reaction(s): Contraindicated CKD, only one kidney Other reaction(s): Contraindicated CKD, only one kidney    VACCINATION STATUS: Immunization History  Administered Date(s) Administered   Moderna SARS-COV2 Booster Vaccination 06/27/2020   Moderna Sars-Covid-2 Vaccination 12/14/2019, 01/18/2020   Tdap 04/18/2018     HPI  Briana Wyatt 51y.o. female who presents today with a medical history as above. she is being seen in consultation for hyperthyroidism and multinodular thyroid requested by Patient, No Pcp Per.  she has been dealing with symptoms of heat intolerance, anxiety, difficulty swallowing, difficulty breathing while laying down, and palpitations on/off since 2009. These symptoms are progressively worsening and troubling to her.  her most recent thyroid labs revealed mildly suppressed TSH of 0.21 on 10/08/21 (no corresponding FreeT4 done at that time).  she denies choking, no recent voice change.  She does have associated dysphagia and shortness of breath.   she does have family history of thyroid dysfunction in  her mother (hypothyroidism), but denies family hx of thyroid cancer. [place]  She previously had ultrasound in 2009 and had a large nodule on right gland biopsied (unsure of exact location) which was benign.  She has also been on Methimazole in the past, not currently on any medication.   she is not on any anti-thyroid medications nor on any thyroid hormone supplements. Denies use of Biotin containing supplements.  she is willing to proceed with appropriate work up and therapy for thyrotoxicosis and MNG.   Review of systems  Constitutional: + Minimally  fluctuating body weight, current Body mass index is 33.32 kg/m., no fatigue, + subjective hyperthermia, no subjective hypothermia, excessive sweating Eyes: no blurry vision, no xerophthalmia ENT: no sore throat, + nodules palpated in throat, + dysphagia, no odynophagia, no hoarseness Cardiovascular: no chest pain, + shortness of breath, + palpitations, no leg swelling Respiratory: no cough, + shortness of breath Gastrointestinal: no nausea/vomiting/diarrhea Musculoskeletal: c/o right shoulder pain-chronic Skin: no rashes, no hyperemia Neurological: no tremors, no numbness, no tingling, no dizziness Psychiatric: no depression, + anxiety   Objective:    BP 129/83 (BP Location: Right Arm, Patient Position: Sitting, Cuff Size: Normal)   Pulse 97   Ht _0  (1.651 m)   Wt 200 lb 3.2 oz (90.8 kg)   BMI 33.32 kg/m   Wt Readings from Last 3 Encounters:  07/21/22 200 lb 3.2 oz (90.8 kg)  07/14/22 207 lb 6.4 oz (94.1 kg)  06/10/22 202 lb (91.6 kg)     BP Readings from Last 3 Encounters:  07/21/22 129/83  07/14/22 127/79  06/10/22 136/70                        Physical Exam- Limited  Constitutional:  Body mass index is 33.32 kg/m. , not in acute distress, moderately anxious state of mind, fidgety Eyes:  EOMI, no exophthalmos Neck: Supple Thyroid: + gross goiter  R>L with palpable right sided nodule; tenderness to palpation- right inferior Cardiovascular: RRR, no murmurs, rubs, or gallops, no edema Respiratory: Adequate breathing efforts, no crackles, rales, rhonchi, or wheezing Musculoskeletal: no gross deformities, strength intact in all four extremities, no gross restriction of joint movements Skin:  no rashes, no hyperemia, + multiple scabbed areas to BUE and BLE in various stages of healing Neurological: no tremor with outstretched hands   CMP     Component Value Date/Time   NA 137 04/28/2022 1546   K 4.5 04/28/2022 1546   CL 105 04/28/2022 1546   CO2 25 04/28/2022 1546   GLUCOSE 111 (H) 04/28/2022 1546   BUN 31 (H) 04/28/2022 1546   CREATININE 1.00 04/28/2022 1546   CALCIUM 8.7 (L) 04/28/2022 1546   PROT 7.2 03/12/2022 0623   ALBUMIN 3.9 03/12/2022 0623   AST 26 03/12/2022 0623   ALT 29 03/12/2022 0623   ALKPHOS 87 03/12/2022 0623   BILITOT 0.5 03/12/2022 0623   GFRNONAA >60 04/28/2022 1546     CBC    Component Value Date/Time   WBC 12.0 (H) 04/28/2022 1546   RBC 4.52 04/28/2022 1546   HGB 12.8 04/28/2022 1546   HCT 38.8 04/28/2022 1546   PLT 280 04/28/2022 1546   MCV 85.8 04/28/2022 1546   MCH 28.3 04/28/2022 1546   MCHC 33.0 04/28/2022 1546   RDW 12.8 04/28/2022 1546   LYMPHSABS 1.6 04/01/2022 1457   MONOABS 0.7 04/01/2022 1457   EOSABS 0.2 04/01/2022 1457   BASOSABS 0.0 04/01/2022 1457     Diabetic Labs (most recent): Lab Results  Component Value Date   HGBA1C 5.7 (H) 03/12/2022   HGBA1C 5.8 (H) 10/08/2021   HGBA1C 5.9 (H) 09/17/2020    Lipid Panel     Component Value Date/Time   CHOL 218 (H) 03/13/2022 0636   TRIG 366 (H) 03/13/2022 0636   HDL 38 (L) 03/13/2022 0636   CHOLHDL 5.7 03/13/2022 0636   VLDL 73 (H) 03/13/2022 0636   LDLCALC 107 (H) 03/13/2022 0636     Lab Results  Component Value Date   TSH  0.827 07/07/2022   TSH 0.129 (L) 03/12/2022   TSH 0.796 11/03/2021   TSH 0.210 (L) 10/08/2021    TSH 0.807 09/17/2020   FREET4 1.01 07/07/2022   FREET4 0.77 03/13/2022   FREET4 0.91 11/03/2021     Thyroid US from 10/15/21 CLINICAL DATA:  Goiter.   EXAM: THYROID ULTRASOUND   TECHNIQUE: Ultrasound examination of the thyroid gland and adjacent soft tissues was performed.   COMPARISON:  None.   FINDINGS: Parenchymal Echotexture: Moderately heterogenous   Isthmus: 0.3 cm   Right lobe: 5.4 x 2.3 x 2.4 cm   Left lobe: 2.9 x 1.2 x 0.7 cm   _________________________________________________________   Estimated total number of nodules >/= 1 cm: 2   Number of spongiform nodules >/=  2 cm not described below (TR1): 0   Number of mixed cystic and solid nodules >/= 1.5 cm not described below (TR2): 0   _________________________________________________________   Nodule # 1:   Location: Right; Superior   Maximum size: 3.3 cm; Other 2 dimensions: 2.7 x 2.0 cm   Composition: mixed cystic and solid (1)   Echogenicity: hypoechoic (2)   Shape: not taller-than-wide (0)   Margins: ill-defined (0)   Echogenic foci: none (0)   ACR TI-RADS total points: 3.   ACR TI-RADS risk category: TR3 (3 points).   ACR TI-RADS recommendations:   **Given size (>/= 2.5 cm) and appearance, fine needle aspiration of this mildly suspicious nodule should be considered based on TI-RADS criteria.   _________________________________________________________   Nodule # 2:   Location: Right; Inferior   Maximum size: 2.3 cm; Other 2 dimensions: 2.1 x 1.5 cm   Composition: mixed cystic and solid (1)   Echogenicity: isoechoic (1)   Shape: not taller-than-wide (0)   Margins: ill-defined (0)   Echogenic foci: none (0)   ACR TI-RADS total points: 2.   ACR TI-RADS risk category: TR2 (2 points).   ACR TI-RADS recommendations:   This nodule does NOT meet TI-RADS criteria for biopsy or dedicated follow-up.   _________________________________________________________   There are 2  additional 0.6 cm less hypoechoic nodules in the left inferior thyroid which appear definitively benign and do not warrant follow-up.   IMPRESSION: 1. Multinodular goiter. 2. Solid-cystic nodule in the right superior thyroid (labeled 1, 3.3 cm) meets criteria (TI-RADS category 3) for tissue sampling. Recommend ultrasound-guided fine-needle aspiration. 3. The remaining visualized bilateral thyroid nodules appear benign and do not warrant additional follow-up.   The above is in keeping with the ACR TI-RADS recommendations - J Am Coll Radiol 2017;14:587-595.   Ruthann Cancer, MD   [facility]   [facility]     Electronically Signed   By: Ruthann Cancer M.D.   On: 10/15/2021 15:58      Assessment & Plan:   1. Thyroid nodule Her FNA biopsy of the right superior thyroid nodule was benign- see pathology report above.  She did have additional nodules, none of which warrant dedicated follow up at this time.  Will order follow up ultrasound to be done prior to follow up in 1 year.  2. Abnormal TSH-resolved  she is being seen at a kind request of Patient, No Pcp Per.  Her repeat thyroid function tests have returned to normal without intervention, favoring acute inflammation?  Will repeat TFTs in 1 year for surveillance.     -Patient is advised to maintain close follow up with Patient, No Pcp Per for primary care needs.  I spent 20 minutes in the care of the patient today including review of labs from Thyroid Function, CMP, and other relevant labs ; imaging/biopsy records (current and previous including abstractions from other facilities); face-to-face time discussing  her lab results and symptoms, medications doses, her options of short and long term treatment based on the latest standards of care / guidelines;   and documenting the encounter.  Briana Wyatt  participated in the discussions, expressed understanding, and  voiced agreement with the above plans.  All questions were answered to her satisfaction. she is encouraged to contact clinic should she have any questions or concerns prior to her return visit.  Follow up plan: Return in about 1 year (around 07/22/2023) for Thyroid follow up, thyroid ultrasound, Previsit labs.   Thank you for involving me in the care of this pleasant patient, and I will continue to update you with her progress.   Rayetta Pigg, [facility] [address] [place], [place] [address] Phone: [phone] Fax: [phone]  07/21/2022, 11:35 AM

## 2022-07-21 NOTE — Assessment & Plan Note (Signed)
She would like surgical intervention for cervical spinal stenosis, but this was discouraged by her previous provider  She would like a second opinion and a referral to another provider Referral placed to neurosurgery

## 2022-07-21 NOTE — Progress Notes (Signed)
New Patient Office Visit  Subjective:  Patient ID: Briana Wyatt, female    DOB: 1971-05-01  Age: 51 y.o. MRN: [id number]  CC:  Chief Complaint  Patient presents with   Establish Care    New patient, establishing care previously seen by free clinic. Would like referral for spine & neurologist due to spine scoliosis, pt also needs ENT referral due to left ear discomfort feels like there is something in there since (07/21/2016) also needs referral to ortho, pt reports shoulder pain from shoulder surgery back in 2020 and 2021.    HPI Briana Wyatt is a 51 y.o. female with past medical history of GERD, cocaine induced mood disorder, and abdominal pain presents for establishing care.  Cervical spinal stenosis: She followed up with neurosurgery with [facility] on 04/16/2022 with a constellation of symptoms including headaches, bilateral paresthesia and in her hands, back pain and lower extremity pain.  MRI of the cervical spine was reviewed and she was noted to have moderate stenosis at multiple levels.  Surgical intervention was discourage due to patient having major psychiatric disturbances and suicidal attempt.  She takes Cymbalta 30 mg twice daily and reports better relief of symptoms with Lyrica.  Left ear pain: She reports having tendinitis with a sensation of a bug crawling in her ears.  She reports seeing ENT years ago in [place] with no abnormal findings.  She complains of a loud buzzing sound in her ear that is constant with symptoms of dizziness and vertigo.  No abnormal discharge or decrease in hearing in the affected ear was noted.  Chronic right shoulder pain: History of 2 rotator cuff repairs with poor results. she followed up with ortho care and saw Dr. Amedeo Kinsman.  She reports receiving a steroid injection in the affected shoulder on 06/26/2022 with minimal relief of her symptoms.  She reports wanting shoulder replacement surgery but was told that she did not meet the age  criteria for the procedure.  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   Hypertension    Hypothyroidism    Kidney disease, chronic, stage III (moderate, EGFR 30-59 ml/min) (HCC)    Movement disorder    PTSD (post-traumatic stress disorder)    Restless leg syndrome    Sciatica of right side    Spondylosis    Cervical and lumbar   Tortuous colon     Past Surgical History:  Procedure Laterality Date   BIOPSY  04/29/2022   Procedure: BIOPSY;  Surgeon: Harvel Quale, MD;  [facility];  Service: Gastroenterology;;   Wilmon Pali RELEASE Bilateral    COLONOSCOPY WITH PROPOFOL N/A 04/29/2022   Procedure: COLONOSCOPY WITH PROPOFOL;  Surgeon: Harvel Quale, MD;  [facility];  Service: Gastroenterology;  Laterality: N/A;  145   ECTOPIC PREGNANCY SURGERY     ESOPHAGOGASTRODUODENOSCOPY (EGD) WITH PROPOFOL N/A 04/29/2022   Procedure: ESOPHAGOGASTRODUODENOSCOPY (EGD) WITH PROPOFOL;  Surgeon: Harvel Quale, MD;  [facility];  Service: Gastroenterology;  Laterality: N/A;   SHOULDER SURGERY Right    SHOULDER SURGERY Right    TUBAL LIGATION      Family History  Problem Relation Age of Onset   Hypertension Father    Stroke Father    Hypertension Sister    Dystonia Sister  Social History   Socioeconomic History   Marital status: Single    Spouse name: Not on file   Number of children: 3   Years of education: Not on file   Highest education level: GED or equivalent  Occupational History   Not on file  Tobacco Use   Smoking status: Former    Types: Cigarettes    Quit date: 08/25/1995    Years since quitting: 26.9    Passive exposure: Past   Smokeless tobacco: Never  Vaping Use   Vaping Use: Never used  Substance and Sexual Activity   Alcohol use: No   Drug  use: Not Currently    Types: "Crack" cocaine    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   Sexual activity: Not Currently    Birth control/protection: Surgical  Other Topics Concern   Not on file  Social History Narrative   Not on file   Social Determinants of Health   Financial Resource Strain: Not on file  Food Insecurity: No Food Insecurity (11/06/2021)   Hunger Vital Sign    Worried About Running Out of Food in the Last Year: Never true    Ran Out of Food in the Last Year: Never true  Transportation Needs: No Transportation Needs (11/06/2021)   PRAPARE - Hydrologist (Medical): No    Lack of Transportation (Non-Medical): No  Physical Activity: Not on file  Stress: Not on file  Social Connections: Not on file  Intimate Partner Violence: Not on file    ROS Review of Systems  Constitutional:  Negative for fever.  Eyes:  Negative for visual disturbance.  Respiratory:  Negative for chest tightness and shortness of breath.   Gastrointestinal:  Negative for nausea and vomiting.  Musculoskeletal:  Positive for arthralgias and back pain.    Objective:   Today's Vitals: BP 130/84   Pulse 87   Ht _0  (1.651 m)   Wt 200 lb 1.3 oz (90.8 kg)   SpO2 97%   BMI 33.30 kg/m   Physical Exam HENT:     Head: Normocephalic.     Left Ear: Hearing, tympanic membrane and external ear normal. No decreased hearing noted. No drainage or tenderness.  No middle ear effusion. There is no impacted cerumen. No foreign body.  Cardiovascular:     Rate and Rhythm: Normal rate and regular rhythm.     Heart sounds: Normal heart sounds.  Pulmonary:     Effort: Pulmonary effort is normal.     Breath sounds: Normal breath sounds.  Musculoskeletal:     Comments: Tenderness with palpation of the lumbar musculature Positive CVA tenderness No overlying skin changes Limited range of motion of the right shoulder Patient unable to perform extension,  external and internal rotation of the right shoulder   Neurological:     Mental Status: She is alert.      Assessment & Plan:   Cervical spinal stenosis Assessment & Plan: She would like surgical intervention for cervical spinal stenosis, but this was discouraged by her previous provider  She would like a second opinion and a referral to another provider Referral placed to neurosurgery  Orders: -     Ambulatory referral to Neurosurgery  Tinnitus of left ear Assessment & Plan: Chronic condition Referral placed to ENT for further evaluation  Orders: -     Ambulatory referral to ENT  Chronic right shoulder pain Assessment & Plan: She reports minimal relief  of her symptoms with steroid injections She would like to proceed with shoulder replacement surgery and would like to see another provider Referral placed to orthopedic surgery in [place]    Orders: -     Ambulatory referral to Orthopedic Surgery  Immunization due -     Varicella-zoster vaccine IM  Encounter for hepatitis C screening test for low risk patient -     Hepatitis C antibody  Encounter for screening for HIV -     HIV Antibody (routine testing w rflx)  GAD (generalized anxiety disorder)  IFG (impaired fasting glucose) -     Hemoglobin A1c  Vitamin D deficiency -     VITAMIN D 25 Hydroxy (Vit-D Deficiency, Fractures)  Other hyperlipidemia -     Lipid panel -     CMP14+EGFR -     CBC with Differential/Platelet     Follow-up: No follow-ups on file.   Alvira Monday, FNP

## 2022-07-21 NOTE — Assessment & Plan Note (Signed)
Chronic condition Referral placed to ENT for further evaluation

## 2022-07-21 NOTE — Assessment & Plan Note (Signed)
She reports minimal relief of her symptoms with steroid injections She would like to proceed with shoulder replacement surgery and would like to see another provider Referral placed to orthopedic surgery in [facility]

## 2022-07-22 ENCOUNTER — Encounter: Payer: Self-pay | Admitting: Family Medicine

## 2022-07-22 NOTE — Telephone Encounter (Signed)
Pt informed

## 2022-07-23 ENCOUNTER — Telehealth: Payer: Self-pay | Admitting: Orthopedic Surgery

## 2022-07-23 NOTE — Telephone Encounter (Signed)
Patient has an appointment tomorrow w/Dr. Amedeo Kinsman, she is stating that she received her medical records via email and wants to know if they can be downloaded.  Pt's # [phone]

## 2022-07-24 ENCOUNTER — Ambulatory Visit: Payer: Medicaid Other | Admitting: Orthopedic Surgery

## 2022-07-24 ENCOUNTER — Encounter: Payer: Self-pay | Admitting: Orthopedic Surgery

## 2022-07-24 VITALS — BP 130/88 | HR 88 | Ht 65.0 in | Wt 202.0 lb

## 2022-07-24 DIAGNOSIS — G8929 Other chronic pain: Secondary | ICD-10-CM

## 2022-07-24 DIAGNOSIS — M25511 Pain in right shoulder: Secondary | ICD-10-CM

## 2022-07-24 NOTE — Progress Notes (Signed)
Orthopaedic Clinic Return  Assessment: Briana Wyatt is a 51 y.o. female with the following: Chronic right shoulder pain   Plan: [name] continues to have right shoulder pain.  Most recent injection did not provide sustained relief.  Concerned that she has not healed her most recent surgery, or has another injury.  No recent PT, will place a referral.  If not improving, will obtain an MRI with contrast.  Follow up as needed.     Follow-up: Return if symptoms worsen or fail to improve.   Subjective:  Chief Complaint  Patient presents with   Shoulder Pain    RT shoulder/painful    History of Present Illness: Briana Wyatt is a 51 y.o. female who returns to clinic for repeat evaluation of right shoulder pain. She continues to have pain in the anterior shoulder.  Limited overhead motion.  Most recent injection was not very helpful.    Review of Systems: No fevers or chills No numbness or tingling No chest pain No shortness of breath No bowel or bladder dysfunction No GI distress No headaches   Objective: BP 130/88   Pulse 88   Ht '5\' 5"'$  (1.651 m)   Wt 202 lb (91.6 kg)   SpO2 98%   BMI 33.61 kg/m   Physical Exam:  Right shoulder with well-healed surgical incisions.  Active forward flexion limited to 90 degrees.  Abduction at her side to 85 degrees.  Tenderness palpation of the medial epicondyle, as well as within the cubital tunnel.  Positive Tinel's at the cubital tunnel.  Decreased sensation to the small finger.  Grip strength is 4+/5.  Restricted range of motion, especially rotation of her neck to the right.  Tenderness to palpation within the right side cervical muscles.  Tenderness to palpation within the trapezius.  2+ radial pulse.  IMAGING: I personally ordered and reviewed the following images:  No new imaging obtained today.  Mordecai Rasmussen, MD 07/24/2022 10:10 PM

## 2022-07-29 ENCOUNTER — Ambulatory Visit: Payer: Medicaid Other | Admitting: Family Medicine

## 2022-07-31 ENCOUNTER — Encounter: Payer: Self-pay | Admitting: Family Medicine

## 2022-07-31 ENCOUNTER — Telehealth: Payer: Self-pay | Admitting: Radiology

## 2022-07-31 ENCOUNTER — Ambulatory Visit: Payer: Medicaid Other | Admitting: Family Medicine

## 2022-07-31 VITALS — BP 117/87 | HR 103 | Ht 65.0 in | Wt 199.1 lb

## 2022-07-31 DIAGNOSIS — G4739 Other sleep apnea: Secondary | ICD-10-CM

## 2022-07-31 DIAGNOSIS — M4802 Spinal stenosis, cervical region: Secondary | ICD-10-CM

## 2022-07-31 DIAGNOSIS — G4730 Sleep apnea, unspecified: Secondary | ICD-10-CM

## 2022-07-31 DIAGNOSIS — Q600 Renal agenesis, unilateral: Secondary | ICD-10-CM | POA: Diagnosis not present

## 2022-07-31 DIAGNOSIS — N1831 Chronic kidney disease, stage 3a: Secondary | ICD-10-CM

## 2022-07-31 DIAGNOSIS — F332 Major depressive disorder, recurrent severe without psychotic features: Secondary | ICD-10-CM

## 2022-07-31 NOTE — Telephone Encounter (Signed)
Patient said she emailed me some records to review. She did and I then emailed them to you all as she had an appt.  She is mentioning that she was told that I needed to review the records before we could give her an appt.  I am unsure what she is referring to as she has already seen Dr Amedeo Kinsman.  Can you please call her or elaborate and let FO know if appt is needed?  Thanks.

## 2022-07-31 NOTE — Progress Notes (Unsigned)
Established Patient Office Visit  Subjective:  Patient ID: Briana Wyatt, female    DOB: 10/18/1970  Age: 51 y.o. MRN: [id number]  CC:  Chief Complaint  Patient presents with   Back Pain    Pt reports flare up of back pain, pain level is a 9/10.    Chronic Kidney Disease    Needs a referral to a kidney specialist.    Insomnia    Needs a referral for a sleep study locally instead of [place].     HPI Briana Wyatt is a 51 y.o. female with past medical history of chronic kidney disease stage, PTSD, GAD, and  MDD presents for f/u of  chronic medical conditions. For the details of today's visit, please refer to the assessment and plan.     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   Hypertension    Hypothyroidism    Kidney disease, chronic, stage III (moderate, EGFR 30-59 ml/min) (HCC)    Movement disorder    PTSD (post-traumatic stress disorder)    Restless leg syndrome    Sciatica of right side    Spondylosis    Cervical and lumbar   Tortuous colon     Past Surgical History:  Procedure Laterality Date   BIOPSY  04/29/2022   Procedure: BIOPSY;  Surgeon: Harvel Quale, MD;  [facility];  Service: Gastroenterology;;   Wilmon Pali RELEASE Bilateral    COLONOSCOPY WITH PROPOFOL N/A 04/29/2022   Procedure: COLONOSCOPY WITH PROPOFOL;  Surgeon: Harvel Quale, MD;  Location: [facility];  Service: Gastroenterology;  Laterality: N/A;  145   ECTOPIC PREGNANCY SURGERY     ESOPHAGOGASTRODUODENOSCOPY (EGD) WITH PROPOFOL N/A 04/29/2022   Procedure: ESOPHAGOGASTRODUODENOSCOPY (EGD) WITH PROPOFOL;  Surgeon: Harvel Quale, MD;  Location: [facility];  Service: Gastroenterology;  Laterality: N/A;   SHOULDER SURGERY Right    SHOULDER SURGERY Right    TUBAL LIGATION       Family History  Problem Relation Age of Onset   Hypertension Father    Stroke Father    Hypertension Sister    Dystonia Sister     Social History   Socioeconomic History   Marital status: Single    Spouse name: Not on file   Number of children: 3   Years of education: Not on file   Highest education level: GED or equivalent  Occupational History   Not on file  Tobacco Use   Smoking status: Former    Types: Cigarettes    Quit date: 08/25/1995    Years since quitting: 26.9    Passive exposure: Past   Smokeless tobacco: Never  Vaping Use   Vaping Use: Never used  Substance and Sexual Activity   Alcohol use: No   Drug use: Not Currently    Types: "Crack" cocaine    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   Sexual activity: Not Currently    Birth control/protection: Surgical  Other Topics Concern   Not on file  Social History Narrative   Not on file   Social Determinants of Health   Financial Resource Strain: Not on file  Food Insecurity: No Food Insecurity (11/06/2021)   Hunger Vital Sign    Worried About Running Out of Food in the  Last Year: Never true    [place] in the Last Year: Never true  Transportation Needs: No Transportation Needs (11/06/2021)   PRAPARE - Hydrologist (Medical): No    Lack of Transportation (Non-Medical): No  Physical Activity: Not on file  Stress: Not on file  Social Connections: Not on file  Intimate Partner Violence: Not on file    Outpatient Medications Prior to Visit  Medication Sig Dispense Refill   acetaminophen (TYLENOL) 650 MG CR tablet Take 650 mg by mouth every 8 (eight) hours as needed for pain or fever.     albuterol (VENTOLIN HFA) 108 (90 Base) MCG/ACT inhaler Inhale 1-2 puffs into the lungs every 4 (four) hours as needed for wheezing or shortness of breath.     Apple Cider Vinegar 500 MG TABS      ARIPiprazole (ABILIFY) 2 MG tablet Take 2 mg by mouth daily.      Ascorbic Acid (VITAMIN C) 500 MG CHEW      Calcium Carbonate-Vitamin D (CALTRATE 600+D PO) Take by mouth.     cetirizine (ZYRTEC) 10 MG tablet Take 10 mg by mouth daily.     Cholecalciferol (VITAMIN D-3 PO) Take 1 tablet by mouth daily.     Erenumab-aooe (AIMOVIG, 140 MG DOSE, [place]) One a month     hydrOXYzine (ATARAX) 25 MG tablet Take by mouth.     linaclotide (LINZESS) 145 MCG CAPS capsule Take 1 capsule (145 mcg total) by mouth daily before breakfast. 30 capsule 2   losartan (COZAAR) 50 MG tablet TAKE 1 Tablet BY MOUTH ONCE EVERY DAY 90 tablet 1   Multiple Vitamins-Minerals (CENTRUM SILVER PO) Take 1 tablet by mouth daily.     omeprazole (PRILOSEC) 40 MG capsule TAKE 1 Capsule BY MOUTH ONCE EVERY DAY 90 capsule 1   rizatriptan (MAXALT) 10 MG tablet Take 10 mg by mouth as needed for migraine.     tiZANidine (ZANAFLEX) 2 MG tablet Take 2 mg by mouth 2 (two) times daily.     DULoxetine (CYMBALTA) 30 MG capsule Take 1 capsule (30 mg total) by mouth 2 (two) times daily. 60 capsule 0   gabapentin (NEURONTIN) 300 MG capsule Take 3 capsules (900 mg total) by mouth 2 (two) times daily. 180 capsule 0   No facility-administered medications prior to visit.    Allergies  Allergen Reactions   Codeine Nausea And Vomiting    High doses of codeine pt reports greater than 33m she can't tolerate    Methocarbamol Itching    "feels like bugs are crawling under skin"   Nsaids     Other reaction(s): Contraindicated CKD, only one kidney Other reaction(s): Contraindicated CKD, only one kidney    ROS Review of Systems    Objective:    Physical Exam  BP 117/87   Pulse (!) 103   Ht _0  (1.651 m)   Wt 199 lb 1.3 oz (90.3 kg)   SpO2 97%   BMI 33.13 kg/m  Wt Readings from Last 3 Encounters:  07/31/22 199 lb 1.3 oz (90.3 kg)  07/24/22 202 lb (91.6 kg)  07/21/22 200 lb 1.3 oz (90.8 kg)    Lab Results  Component Value Date   TSH 0.827 07/07/2022   Lab Results  Component Value Date   WBC  12.0 (H) 04/28/2022   HGB 12.8 04/28/2022   HCT 38.8 04/28/2022   MCV 85.8 04/28/2022   PLT 280 04/28/2022   Lab Results  Component Value Date   NA 137 04/28/2022   K 4.5 04/28/2022   CO2 25 04/28/2022   GLUCOSE 111 (H) 04/28/2022   BUN 31 (H) 04/28/2022   CREATININE 1.00 04/28/2022   BILITOT 0.5 03/12/2022   ALKPHOS 87 03/12/2022   AST 26 03/12/2022   ALT 29 03/12/2022   PROT 7.2 03/12/2022   ALBUMIN 3.9 03/12/2022   CALCIUM 8.7 (L) 04/28/2022   ANIONGAP 7 04/28/2022   Lab Results  Component Value Date   CHOL 218 (H) 03/13/2022   Lab Results  Component Value Date   HDL 38 (L) 03/13/2022   Lab Results  Component Value Date   LDLCALC 107 (H) 03/13/2022   Lab Results  Component Value Date   TRIG 366 (H) 03/13/2022   Lab Results  Component Value Date   CHOLHDL 5.7 03/13/2022   Lab Results  Component Value Date   HGBA1C 5.7 (H) 03/12/2022      Assessment & Plan:  Sleep apnea in adult -     Ambulatory referral to Sleep Studies  Chronic kidney disease, stage 3a ([place]) Assessment & Plan: She reports history of unilateral renal agenesis She was following up with a nephrologist in [place], Dr Ardyth Man Referral placed  Orders: -     Ambulatory referral to Nephrology  Cervical spinal stenosis Assessment & Plan: She complains of flareup of back pain Pain is rated 9 out of 10 and is sharp She admits that nothing relieves her pain She has allergies to NSAIDs and cannot receive Toradol 60 mg IM injection in the clinic today She would like surgical intervention for cervical spinal stenosis, but this was discouraged by her previous provider  She would like a second opinion and a referral to another provider Referral placed to neurosurgery  Orders: -     Ambulatory referral to Neurosurgery -     AMB Referral to Pharmacy Medication Management  Renal agenesis, unilateral -     Ambulatory referral to Nephrology  MDD (major depressive disorder),  recurrent severe, without psychosis ([place]) -     AMB Referral to Pharmacy Medication Management  Other sleep apnea Assessment & Plan: She complains of snoring and apneic events during sleep A referral was placed for a sleep study in [place] but she would like a referral locally Referral placed     Follow-up: Return in about 3 months (around 10/21/2022).   Alvira Monday, FNP

## 2022-07-31 NOTE — Patient Instructions (Addendum)
I appreciate the opportunity to provide care to you today!    Follow up:  10/21/22  Labs: please stop by the lab  during the week  to get your blood drawn (CBC, CMP, TSH, Lipid profile, HgA1c, Vit D)  Referrals today- Sleep study, Nephrology, Pharmacy Medication management, and neurosurgery.   Please continue to a heart-healthy diet and increase your physical activities. Try to exercise for 24mns at least three times a week.      It was a pleasure to see you and I look forward to continuing to work together on your health and well-being. Please do not hesitate to call the office if you need care or have questions about your care.   Have a wonderful day and week. With Gratitude, GAlvira MondayMSN, FNP-BC

## 2022-08-01 DIAGNOSIS — G4730 Sleep apnea, unspecified: Secondary | ICD-10-CM | POA: Insufficient documentation

## 2022-08-01 NOTE — Assessment & Plan Note (Signed)
She reports history of unilateral renal agenesis She was following up with a nephrologist in [place], Dr Ardyth Man Referral placed

## 2022-08-01 NOTE — Assessment & Plan Note (Signed)
She complains of snoring and apneic events during sleep A referral was placed for a sleep study in [place] but she would like a referral locally Referral placed

## 2022-08-01 NOTE — Assessment & Plan Note (Signed)
She complains of flareup of back pain Pain is rated 9 out of 10 and is sharp She admits that nothing relieves her pain She has allergies to NSAIDs and cannot receive Toradol 60 mg IM injection in the clinic today She would like surgical intervention for cervical spinal stenosis, but this was discouraged by her previous provider  She would like a second opinion and a referral to another provider Referral placed to neurosurgery

## 2022-08-03 ENCOUNTER — Other Ambulatory Visit: Payer: Self-pay

## 2022-08-03 ENCOUNTER — Telehealth: Payer: Self-pay | Admitting: Family Medicine

## 2022-08-03 DIAGNOSIS — K219 Gastro-esophageal reflux disease without esophagitis: Secondary | ICD-10-CM

## 2022-08-03 MED ORDER — OMEPRAZOLE 40 MG PO CPDR: DELAYED_RELEASE_CAPSULE | ORAL | 1 refills | Status: AC

## 2022-08-03 MED ORDER — OMEPRAZOLE 40 MG PO CPDR: DELAYED_RELEASE_CAPSULE | ORAL | 1 refills | Status: DC

## 2022-08-03 NOTE — Telephone Encounter (Signed)
Refill sent.

## 2022-08-03 NOTE — Telephone Encounter (Signed)
Patient called left voicemail need med refills  omeprazole (PRILOSEC) 40 MG capsule [[id number]]   Pharmacy: [facility]

## 2022-08-08 ENCOUNTER — Emergency Department (HOSPITAL_COMMUNITY): Payer: Medicaid Other

## 2022-08-08 ENCOUNTER — Emergency Department (HOSPITAL_COMMUNITY)
Admission: EM | Admit: 2022-08-08 | Discharge: 2022-08-08 | Disposition: A | Discharge status: Home / Self Care | Payer: Medicaid Other | Attending: Emergency Medicine | Admitting: Emergency Medicine

## 2022-08-08 DIAGNOSIS — M25512 Pain in left shoulder: Secondary | ICD-10-CM | POA: Diagnosis not present

## 2022-08-08 DIAGNOSIS — I10 Essential (primary) hypertension: Secondary | ICD-10-CM | POA: Diagnosis not present

## 2022-08-08 DIAGNOSIS — S3992XA Unspecified injury of lower back, initial encounter: Secondary | ICD-10-CM | POA: Diagnosis not present

## 2022-08-08 DIAGNOSIS — M542 Cervicalgia: Secondary | ICD-10-CM | POA: Diagnosis present

## 2022-08-08 DIAGNOSIS — M25519 Pain in unspecified shoulder: Secondary | ICD-10-CM | POA: Diagnosis not present

## 2022-08-08 DIAGNOSIS — M5412 Radiculopathy, cervical region: Secondary | ICD-10-CM | POA: Insufficient documentation

## 2022-08-08 DIAGNOSIS — Y92410 Unspecified street and highway as the place of occurrence of the external cause: Secondary | ICD-10-CM | POA: Insufficient documentation

## 2022-08-08 DIAGNOSIS — S199XXA Unspecified injury of neck, initial encounter: Secondary | ICD-10-CM | POA: Diagnosis not present

## 2022-08-08 MED ORDER — OXYCODONE HCL 5 MG PO TABS: 5.0000 mg | ORAL_TABLET | Freq: Three times a day (TID) | ORAL | 0 refills | Status: DC | PRN

## 2022-08-08 MED ORDER — OXYCODONE-ACETAMINOPHEN 5-325 MG PO TABS
1.0000 | ORAL_TABLET | Freq: Once | ORAL | Status: AC
  Administered 2022-08-08: 1 via ORAL
  Filled 2022-08-08: qty 1

## 2022-08-08 MED ORDER — TIZANIDINE HCL 4 MG PO TABS
2.0000 mg | ORAL_TABLET | Freq: Once | ORAL | Status: AC
  Administered 2022-08-08: 2 mg via ORAL
  Filled 2022-08-08: qty 1
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
## 2022-08-08 NOTE — ED Notes (Signed)
Discharge instructions reviewed with patient. Patient denies any questions or concerns. Patient ambulatory out of ED.

## 2022-08-10 ENCOUNTER — Encounter: Payer: Self-pay | Admitting: Obstetrics & Gynecology

## 2022-08-10 ENCOUNTER — Ambulatory Visit (INDEPENDENT_AMBULATORY_CARE_PROVIDER_SITE_OTHER): Payer: Medicaid Other | Admitting: Obstetrics & Gynecology

## 2022-08-10 VITALS — BP 138/81 | HR 90 | Ht 65.0 in | Wt 207.0 lb

## 2022-08-10 DIAGNOSIS — R69 Illness, unspecified: Secondary | ICD-10-CM | POA: Diagnosis not present

## 2022-08-10 DIAGNOSIS — Z114 Encounter for screening for human immunodeficiency virus [HIV]: Secondary | ICD-10-CM | POA: Diagnosis not present

## 2022-08-10 DIAGNOSIS — E041 Nontoxic single thyroid nodule: Secondary | ICD-10-CM | POA: Diagnosis not present

## 2022-08-10 DIAGNOSIS — R7301 Impaired fasting glucose: Secondary | ICD-10-CM | POA: Diagnosis not present

## 2022-08-10 DIAGNOSIS — D219 Benign neoplasm of connective and other soft tissue, unspecified: Secondary | ICD-10-CM | POA: Diagnosis not present

## 2022-08-10 DIAGNOSIS — R7989 Other specified abnormal findings of blood chemistry: Secondary | ICD-10-CM | POA: Diagnosis not present

## 2022-08-10 DIAGNOSIS — N951 Menopausal and female climacteric states: Secondary | ICD-10-CM | POA: Diagnosis not present

## 2022-08-10 DIAGNOSIS — Z1159 Encounter for screening for other viral diseases: Secondary | ICD-10-CM | POA: Diagnosis not present

## 2022-08-10 DIAGNOSIS — E559 Vitamin D deficiency, unspecified: Secondary | ICD-10-CM | POA: Diagnosis not present

## 2022-08-10 MED ORDER — VEOZAH 45 MG PO TABS: 1.0000 | ORAL_TABLET | Freq: Every day | ORAL | 11 refills | Status: DC

## 2022-08-10 NOTE — Progress Notes (Signed)
Follow up appointment for results: CT scan fibroids  Chief Complaint  Patient presents with   Fibroids    Pelvic pain    Blood pressure 138/81, pulse 90, height _0  (1.651 m), weight 207 lb (93.9 kg), last menstrual period 08/25/2019.  Narrative & Impression  CLINICAL DATA:  Nausea and vomiting, chronic abdominal pain.   EXAM: CT ABDOMEN AND PELVIS WITH CONTRAST   TECHNIQUE: Multidetector CT imaging of the abdomen and pelvis was performed using the standard protocol following bolus administration of intravenous contrast.   RADIATION DOSE REDUCTION: This exam was performed according to the departmental dose-optimization program which includes automated exposure control, adjustment of the mA and/or kV according to patient size and/or use of iterative reconstruction technique.   CONTRAST:  132m OMNIPAQUE IOHEXOL 300 MG/ML  SOLN   COMPARISON:  Renal ultrasound Dec 26, 2021   FINDINGS: Lower chest: No acute abnormality.   Hepatobiliary: Diffuse hepatic steatosis. Gallbladder is unremarkable. No biliary ductal dilation.   Pancreas: No pancreatic ductal dilation or evidence of acute inflammation.   Spleen: No splenomegaly or focal splenic lesion.   Adrenals/Urinary Tract: Bilateral adrenal glands are within normal limits.   Chronic atrophy of the left kidney. Right kidney appears normal without hydronephrosis. Urinary bladder is unremarkable for degree of distension.   Stomach/Bowel: Radiopaque enteric contrast material traverses the descending colon. Stomach is unremarkable for degree of distension. No pathologic dilation of small or large bowel. No evidence of acute bowel inflammation. Moderate volume of formed stool throughout the colon may reflect constipation.   Vascular/Lymphatic: Normal caliber abdominal aorta. No pathologically enlarged abdominal or pelvic lymph nodes.   Reproductive: Uterine leiomyomas. Nabothian cysts in the cervix. No suspicious  adnexal mass.   Other: No significant abdominopelvic free fluid.   Musculoskeletal: No acute osseous abnormality.   IMPRESSION: 1. No acute abnormality identified in the abdomen or pelvis. 2. Diffuse hepatic steatosis which in the setting of steatohepatitis can be a cause of abdominal pain. 3. Chronic left renal atrophy. 4. Uterine leiomyomas.     Electronically Signed   By: JDahlia BailiffM.D.   On: 05/21/2022 17:43   I viewed the scan and the fibroids are small clinically insignificant Pt is menopausal for 2 years or so  MEDS ordered this encounter: Meds ordered this encounter  Medications   Fezolinetant (VEOZAH) 45 MG TABS    Sig: Take 1 tablet by mouth at bedtime.    Dispense:  30 tablet    Refill:  11    Orders for this encounter: No orders of the defined types were placed in this encounter.   Impression + Management Plan   ICD-10-CM   1. Fibroids, small diagnosed with CT scan done for other reasons  D21.9     2. Vasomotor symptoms due to menopause  N95.1    5-6 per day and 5-6 per night-->begin veozah 45 mg qhs-->savings card given      Follow Up: Return in about 6 weeks (around 09/21/2022) for Follow up, with Dr EElonda Husky     All questions were answered.  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   Hypertension    Hypothyroidism    Kidney disease, chronic, stage III (moderate, EGFR 30-59 ml/min) (HCC)    Movement disorder    PTSD (post-traumatic stress disorder)  Restless leg syndrome    Sciatica of right side    Spondylosis    Cervical and lumbar   Tortuous colon     Past Surgical History:  Procedure Laterality Date   BIOPSY  04/29/2022   Procedure: BIOPSY;  Surgeon: Harvel Quale, MD;  [facility];  Service: Gastroenterology;;   CARPAL TUNNEL RELEASE Bilateral     COLONOSCOPY WITH PROPOFOL N/A 04/29/2022   Procedure: COLONOSCOPY WITH PROPOFOL;  Surgeon: Harvel Quale, MD;  [facility];  Service: Gastroenterology;  Laterality: N/A;  145   ECTOPIC PREGNANCY SURGERY     ESOPHAGOGASTRODUODENOSCOPY (EGD) WITH PROPOFOL N/A 04/29/2022   Procedure: ESOPHAGOGASTRODUODENOSCOPY (EGD) WITH PROPOFOL;  Surgeon: Harvel Quale, MD;  [facility];  Service: Gastroenterology;  Laterality: N/A;   SHOULDER SURGERY Right    SHOULDER SURGERY Right    TUBAL LIGATION      OB History     Gravida  3   Para  3   Term  3   Preterm      AB      Living  3      SAB      IAB      Ectopic      Multiple      Live Births              Allergies  Allergen Reactions   Codeine Nausea And Vomiting    High doses of codeine pt reports greater than 62m she can't tolerate    Methocarbamol Itching    "feels like bugs are crawling under skin"   Nsaids     Other reaction(s): Contraindicated CKD, only one kidney Other reaction(s): Contraindicated CKD, only one kidney    Social History   Socioeconomic History   Marital status: Single    Spouse name: Not on file   Number of children: 3   Years of education: Not on file   Highest education level: GED or equivalent  Occupational History   Not on file  Tobacco Use   Smoking status: Former    Types: Cigarettes    Quit date: 08/25/1995    Years since quitting: 26.9    Passive exposure: Past   Smokeless tobacco: Never  Vaping Use   Vaping Use: Never used  Substance and Sexual Activity   Alcohol use: No   Drug use: Not Currently    Types: "Crack" cocaine    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   Sexual activity: Not Currently    Birth control/protection: Surgical  Other Topics Concern   Not on file  Social History Narrative   Not on file   Social Determinants of Health   Financial Resource Strain: Low Risk  (08/10/2022)    Overall Financial Resource Strain (CARDIA)    Difficulty of Paying Living Expenses: Not hard at all  Food Insecurity: Food Insecurity Present (08/10/2022)   Hunger Vital Sign    Worried About Running Out of Food in the Last Year: Never true    Ran Out of Food in the Last Year: Sometimes true  Transportation Needs: Unmet Transportation Needs (08/10/2022)   PRAPARE - Transportation    Lack of Transportation (Medical): Yes    Lack of Transportation (Non-Medical): Yes  Physical Activity: Insufficiently Active (08/10/2022)   Exercise Vital Sign    Days of Exercise per Week: 3 days    Minutes of Exercise per Session: 20 min  Stress: Stress  Concern Present (08/10/2022)   [place]    Feeling of Stress : Very much  Social Connections: Socially Isolated (08/10/2022)   Social Connection and Isolation Panel [NHANES]    Frequency of Communication with Friends and Family: Three times a week    Frequency of Social Gatherings with Friends and Family: Twice a week    Attends Religious Services: Never    Marine scientist or Organizations: No    Attends Archivist Meetings: Never    Marital Status: Divorced    Family History  Problem Relation Age of Onset   Hypertension Father    Stroke Father    Hypertension Sister    Dystonia Sister

## 2022-08-11 ENCOUNTER — Ambulatory Visit (HOSPITAL_COMMUNITY): Payer: No Typology Code available for payment source | Admitting: Occupational Therapy

## 2022-08-11 LAB — CMP14+EGFR
ALT: 27 IU/L (ref 0–32)
AST: 27 IU/L (ref 0–40)
Albumin/Globulin Ratio: 1.7 (ref 1.2–2.2)
Albumin: 3.7 g/dL — ABNORMAL LOW (ref 3.8–4.9)
Alkaline Phosphatase: 108 IU/L (ref 44–121)
BUN/Creatinine Ratio: 30 — ABNORMAL HIGH (ref 9–23)
BUN: 23 mg/dL (ref 6–24)
Bilirubin Total: <0.2 mg/dL (ref 0.0–1.2)
CO2: 20 mmol/L (ref 20–29)
Calcium: 9.2 mg/dL (ref 8.7–10.2)
Chloride: 103 mmol/L (ref 96–106)
Creatinine, Ser: 0.77 mg/dL (ref 0.57–1.00)
Globulin, Total: 2.2 g/dL (ref 1.5–4.5)
Glucose: 109 mg/dL — ABNORMAL HIGH (ref 70–99)
Potassium: 4.5 mmol/L (ref 3.5–5.2)
Sodium: 140 mmol/L (ref 134–144)
Total Protein: 5.9 g/dL — ABNORMAL LOW (ref 6.0–8.5)
eGFR: 92 mL/min/{1.73_m2} (ref >59)

## 2022-08-11 LAB — CBC WITH DIFFERENTIAL/PLATELET
Basophils Absolute: 0.1 10*3/uL (ref 0.0–0.2)
Basos: 1 %
EOS (ABSOLUTE): 0.2 10*3/uL (ref 0.0–0.4)
Eos: 5 %
Hematocrit: 34.5 % (ref 34.0–46.6)
Hemoglobin: 11.9 g/dL (ref 11.1–15.9)
Immature Grans (Abs): 0 10*3/uL (ref 0.0–0.1)
Immature Granulocytes: 1 %
Lymphocytes Absolute: 1.5 10*3/uL (ref 0.7–3.1)
Lymphs: 38 %
MCH: 27.9 pg (ref 26.6–33.0)
MCHC: 34.5 g/dL (ref 31.5–35.7)
MCV: 81 fL (ref 79–97)
Monocytes Absolute: 0.4 10*3/uL (ref 0.1–0.9)
Monocytes: 10 %
Neutrophils Absolute: 1.8 10*3/uL (ref 1.4–7.0)
Neutrophils: 45 %
Platelets: 239 10*3/uL (ref 150–450)
RBC: 4.26 x10E6/uL (ref 3.77–5.28)
RDW: 11.6 % — ABNORMAL LOW (ref 11.7–15.4)
WBC: 4 10*3/uL (ref 3.4–10.8)

## 2022-08-11 LAB — T3, FREE: T3, Free: 3.7 pg/mL (ref 2.0–4.4)

## 2022-08-11 LAB — LIPID PANEL
Chol/HDL Ratio: 5 ratio — ABNORMAL HIGH (ref 0.0–4.4)
Cholesterol, Total: 217 mg/dL — ABNORMAL HIGH (ref 100–199)
HDL: 43 mg/dL (ref >39)
LDL Chol Calc (NIH): 92 mg/dL (ref 0–99)
Triglycerides: 498 mg/dL — ABNORMAL HIGH (ref 0–149)
VLDL Cholesterol Cal: 82 mg/dL — ABNORMAL HIGH (ref 5–40)

## 2022-08-11 LAB — TSH: TSH: 1.55 u[IU]/mL (ref 0.450–4.500)

## 2022-08-11 LAB — HEMOGLOBIN A1C
Est. average glucose Bld gHb Est-mCnc: 123 mg/dL
Hgb A1c MFr Bld: 5.9 % — ABNORMAL HIGH (ref 4.8–5.6)

## 2022-08-11 LAB — VITAMIN D 25 HYDROXY (VIT D DEFICIENCY, FRACTURES): Vit D, 25-Hydroxy: 26.5 ng/mL — ABNORMAL LOW (ref 30.0–100.0)

## 2022-08-11 LAB — HIV ANTIBODY (ROUTINE TESTING W REFLEX): HIV Screen 4th Generation wRfx: NONREACTIVE

## 2022-08-11 LAB — HEPATITIS C ANTIBODY: Hep C Virus Ab: NONREACTIVE

## 2022-08-11 LAB — T4, FREE: Free T4: 1.15 ng/dL (ref 0.82–1.77)

## 2022-08-11 NOTE — Progress Notes (Signed)
She did these labs too soon.  They were supposed to be done prior to her appointment next year.  I am holding off on reordering the labs, just to make sure she doesn't get them done earlier than needed.

## 2022-08-12 ENCOUNTER — Ambulatory Visit (HOSPITAL_COMMUNITY)
Admission: RE | Admit: 2022-08-12 | Discharge: 2022-08-12 | Disposition: A | Discharge status: Home / Self Care | Payer: Medicaid Other | Source: Ambulatory Visit | Attending: Nurse Practitioner | Admitting: Nurse Practitioner

## 2022-08-12 DIAGNOSIS — R7989 Other specified abnormal findings of blood chemistry: Secondary | ICD-10-CM | POA: Insufficient documentation

## 2022-08-12 DIAGNOSIS — E041 Nontoxic single thyroid nodule: Secondary | ICD-10-CM | POA: Diagnosis not present

## 2022-08-13 ENCOUNTER — Telehealth: Payer: Self-pay | Admitting: *Deleted

## 2022-08-13 DIAGNOSIS — M47812 Spondylosis without myelopathy or radiculopathy, cervical region: Secondary | ICD-10-CM | POA: Diagnosis not present

## 2022-08-13 DIAGNOSIS — G5621 Lesion of ulnar nerve, right upper limb: Secondary | ICD-10-CM | POA: Diagnosis not present

## 2022-08-13 DIAGNOSIS — M47816 Spondylosis without myelopathy or radiculopathy, lumbar region: Secondary | ICD-10-CM | POA: Diagnosis not present

## 2022-08-13 NOTE — Progress Notes (Signed)
Patient was called and given results per Rayetta Pigg, NP. Per Whitney a follow up is good for 1 year.

## 2022-08-13 NOTE — Progress Notes (Signed)
Her thyroid ultrasound was stable, no significant changes.  Good to follow up in 1 year.

## 2022-08-13 NOTE — Telephone Encounter (Signed)
Patient was called and given her lab an Ultrasound results per Rayetta Pigg , NP Per Loree Fee is is okay for a follow up visit in 1 year.

## 2022-08-13 NOTE — Telephone Encounter (Signed)
-----   Message from Brita Romp, NP sent at 08/13/2022 11:36 AM EST ----- Her thyroid ultrasound was stable, no significant changes.  Good to follow up in 1 year.

## 2022-08-13 NOTE — Telephone Encounter (Signed)
-----   Message from Brita Romp, NP sent at 08/11/2022  7:30 AM EST ----- She did these labs too soon.  They were supposed to be done prior to her appointment next year.  I am holding off on reordering the labs, just to make sure she doesn't get them done earlier than needed.

## 2022-08-13 NOTE — Telephone Encounter (Signed)
Pt's insurance has denied covering [place]. Please advise. Thanks! [place]

## 2022-08-14 ENCOUNTER — Telehealth: Payer: Self-pay | Admitting: *Deleted

## 2022-08-14 ENCOUNTER — Encounter: Payer: Medicaid Other | Admitting: Orthopedic Surgery

## 2022-08-14 NOTE — Telephone Encounter (Signed)
Pt aware to take the samples for the month and let us know before she runs out how it goes and we will speak to the rep if it works well to get it for you. Pt voiced understanding. [place]

## 2022-08-18 ENCOUNTER — Ambulatory Visit: Payer: Medicaid Other | Admitting: Internal Medicine

## 2022-08-18 ENCOUNTER — Encounter: Payer: Self-pay | Admitting: Internal Medicine

## 2022-08-18 VITALS — BP 109/80 | HR 116 | Resp 16 | Ht 65.0 in | Wt 209.0 lb

## 2022-08-18 DIAGNOSIS — M25552 Pain in left hip: Secondary | ICD-10-CM | POA: Diagnosis not present

## 2022-08-18 DIAGNOSIS — M25551 Pain in right hip: Secondary | ICD-10-CM | POA: Diagnosis not present

## 2022-08-18 DIAGNOSIS — M545 Low back pain, unspecified: Secondary | ICD-10-CM

## 2022-08-18 DIAGNOSIS — R8290 Unspecified abnormal findings in urine: Secondary | ICD-10-CM | POA: Diagnosis not present

## 2022-08-18 LAB — POCT URINALYSIS DIP (CLINITEK)
Bilirubin, UA: NEGATIVE
Blood, UA: NEGATIVE
Glucose, UA: NEGATIVE mg/dL
Ketones, POC UA: NEGATIVE mg/dL
Leukocytes, UA: NEGATIVE
Nitrite, UA: NEGATIVE
POC PROTEIN,UA: NEGATIVE
Spec Grav, UA: 1.015 (ref 1.010–1.025)
Urobilinogen, UA: 0.2 E.U./dL
pH, UA: 6 (ref 5.0–8.0)
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
## 2022-08-18 NOTE — Progress Notes (Signed)
Please inform the patient that her labs indicate that she is prediabetic. I recommend decreasing her intake of high-sugar foods. Her vitamin D is slightly low; I recommend taking an over-the-counter vitamin D supplement of 1000 IU daily. Her triglyceride levels are slightly elevated; I recommend that she take fish oil 2000 mg twice daily and decrease her intake of carbs and saturated fats. Her kidneys, liver, and thyroid levels are stable.

## 2022-08-19 NOTE — Assessment & Plan Note (Addendum)
No acute findings on CT lumbar and cervical spine in ED. Patient has appointment scheduled with neurosurgery and reports MRI cervical spine + EMG have been ordered. No further workup to add at this time.

## 2022-08-25 DIAGNOSIS — F6381 Intermittent explosive disorder: Secondary | ICD-10-CM | POA: Diagnosis not present

## 2022-08-25 DIAGNOSIS — F329 Major depressive disorder, single episode, unspecified: Secondary | ICD-10-CM | POA: Diagnosis not present

## 2022-08-25 DIAGNOSIS — F4310 Post-traumatic stress disorder, unspecified: Secondary | ICD-10-CM | POA: Diagnosis not present

## 2022-08-25 DIAGNOSIS — F419 Anxiety disorder, unspecified: Secondary | ICD-10-CM | POA: Diagnosis not present

## 2022-08-26 ENCOUNTER — Encounter (HOSPITAL_COMMUNITY): Payer: Self-pay | Admitting: Occupational Therapy

## 2022-08-31 ENCOUNTER — Encounter: Payer: Self-pay | Admitting: Orthopedic Surgery

## 2022-08-31 ENCOUNTER — Ambulatory Visit (INDEPENDENT_AMBULATORY_CARE_PROVIDER_SITE_OTHER): Payer: Medicaid Other | Admitting: Orthopedic Surgery

## 2022-08-31 DIAGNOSIS — M7061 Trochanteric bursitis, right hip: Secondary | ICD-10-CM | POA: Diagnosis not present
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
## 2022-08-31 NOTE — Patient Instructions (Signed)

## 2022-09-02 ENCOUNTER — Encounter: Payer: Self-pay | Admitting: Obstetrics & Gynecology

## 2022-09-02 ENCOUNTER — Other Ambulatory Visit: Payer: Self-pay | Admitting: Pharmacist

## 2022-09-02 NOTE — Progress Notes (Signed)
   09/02/2022  Patient ID: Briana Wyatt, female   DOB: 09-07-1970, 52 y.o.   MRN: [id number]  Referred by: Alvira Monday, FNP Reason for referral : medication assistance   Unsuccessful outreach to patient today.  VM was left with patient requesting return call to PharmD   Will outreach again within 2 weeks unless call returned sooner    Regina Eck, PharmD, BCPS, [place] Clinical Pharmacist, [place]

## 2022-09-08 MED ORDER — KETOROLAC TROMETHAMINE 10 MG PO TABS: 10.0000 mg | ORAL_TABLET | Freq: Three times a day (TID) | ORAL | 0 refills | Status: DC | PRN

## 2022-09-08 NOTE — Addendum Note (Signed)
Addended by: Florian Buff on: 09/08/2022 04:25 PM   Modules accepted: Orders

## 2022-09-08 NOTE — Addendum Note (Signed)
Addended by: Florian Buff on: 09/08/2022 09:29 AM   Modules accepted: Orders

## 2022-09-09 ENCOUNTER — Other Ambulatory Visit: Payer: Medicaid Other | Admitting: Pharmacist

## 2022-09-14 ENCOUNTER — Encounter: Payer: Self-pay | Admitting: *Deleted

## 2022-09-16 IMAGING — CT CT HEAD W/O CM
4 series · 15 of 47 positions shown, 17 images · non-contrast
Comparison: CT head 09/14/2020

CLINICAL DATA: Hit her head while getting out of and and slipped
and fell landing on her back, back pain



[Series 2: head w o · axial · 0.46mm/px · z∈[+55,+175]mm · 7 of 33 slices shown, 9 images]
[im 5/33  brain]
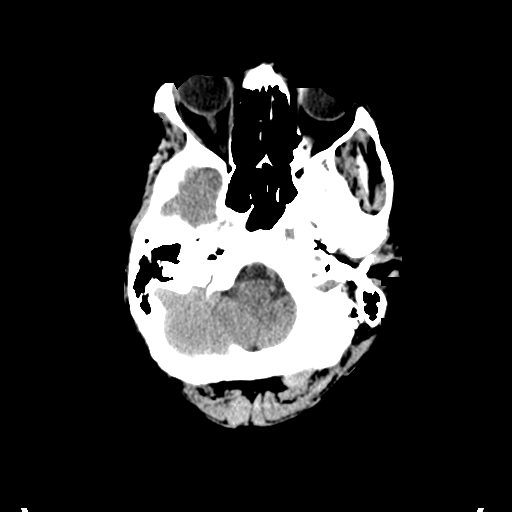
[im 5/33  bone]
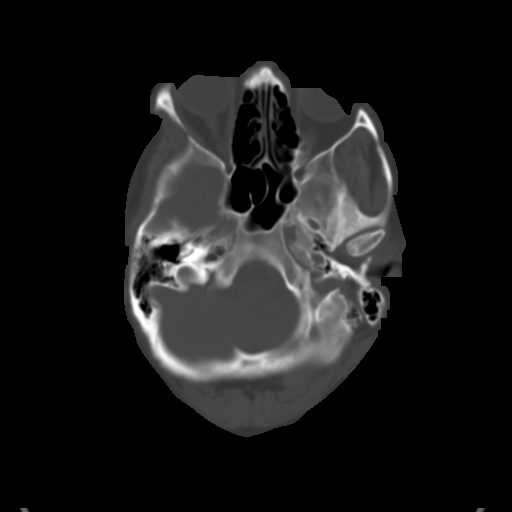
[im 9/33  brain]
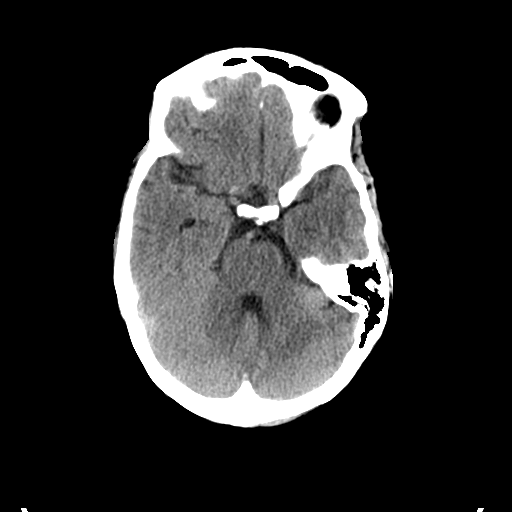
[im 13/33  brain]
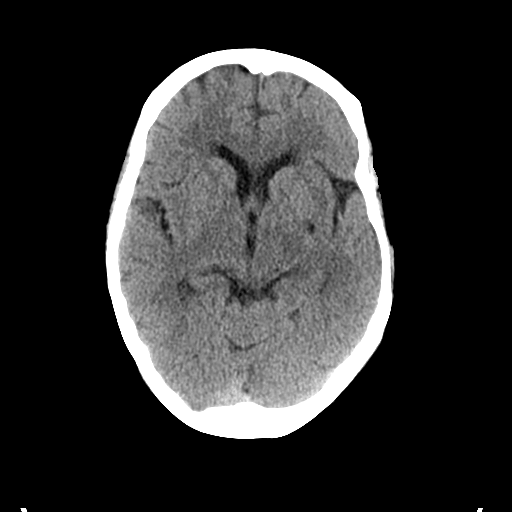
[im 17/33  brain]
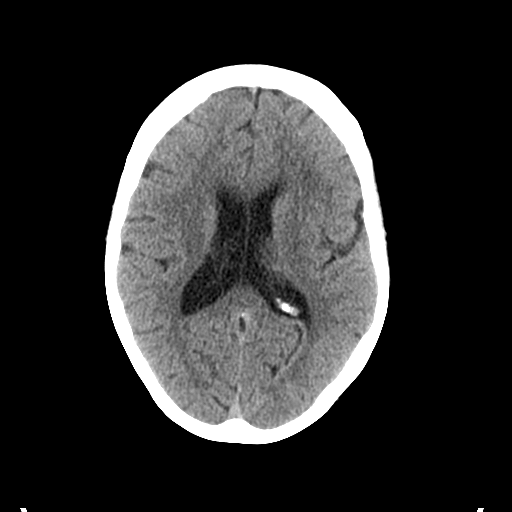
[im 21/33  brain]
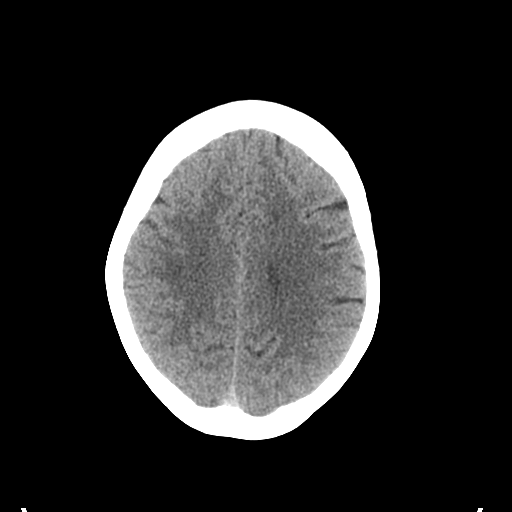
[im 21/33  bone]
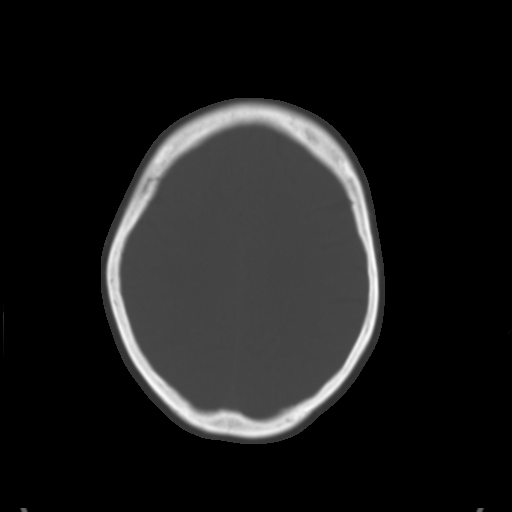
[im 25/33  brain]
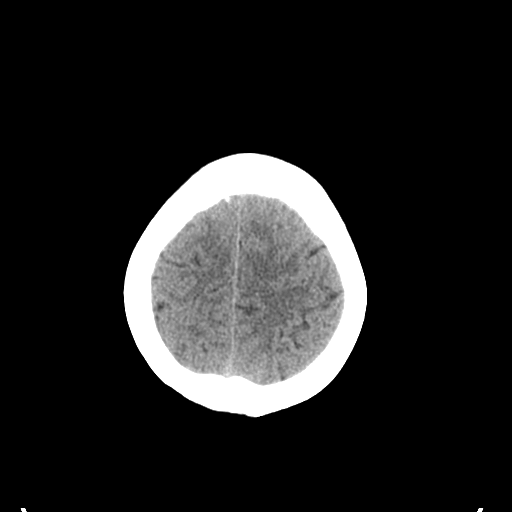
[im 29/33  brain]
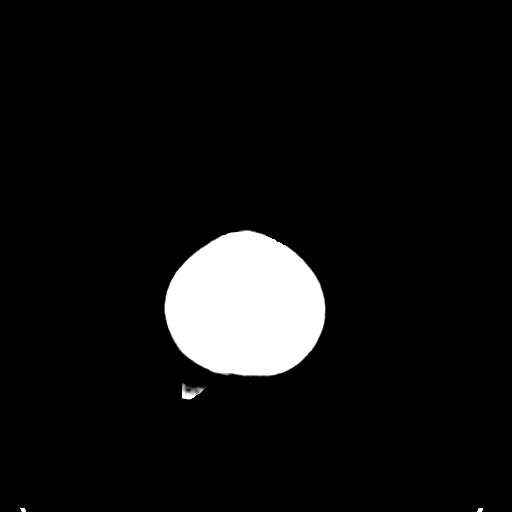

[Series 3: head bone · axial · 0.46mm/px · z∈[+51,+67]mm · 2 of 82 slices shown]
[im 9/82  bone]
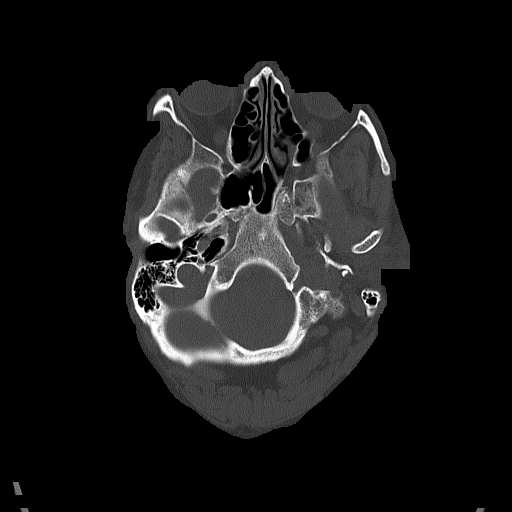
[im 17/82  bone]
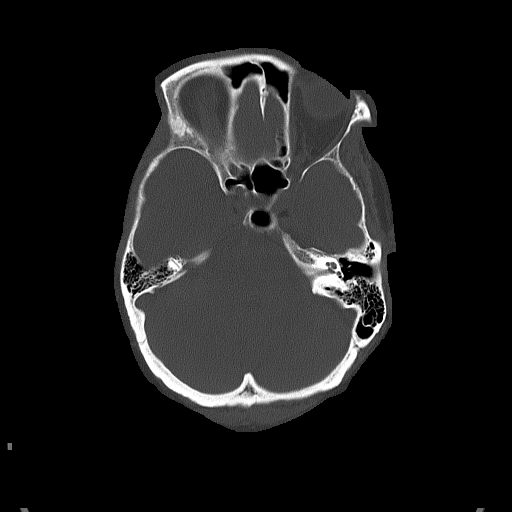

[Series 4: coronal soft · coronal · 0.32mm/px · 3 of 70 slices shown]
[im 24/70  brain]
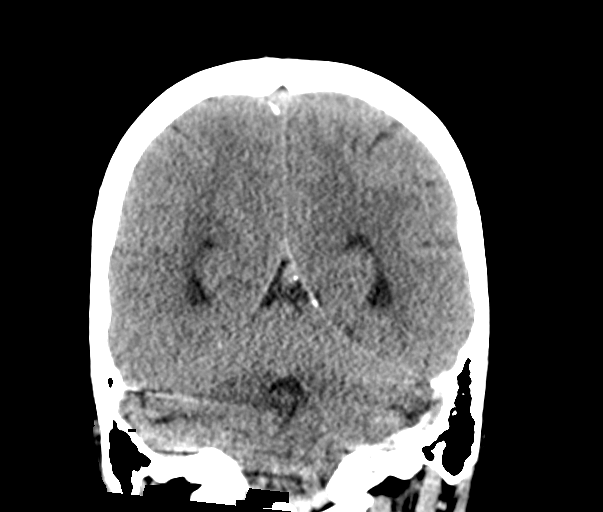
[im 31/70  brain]
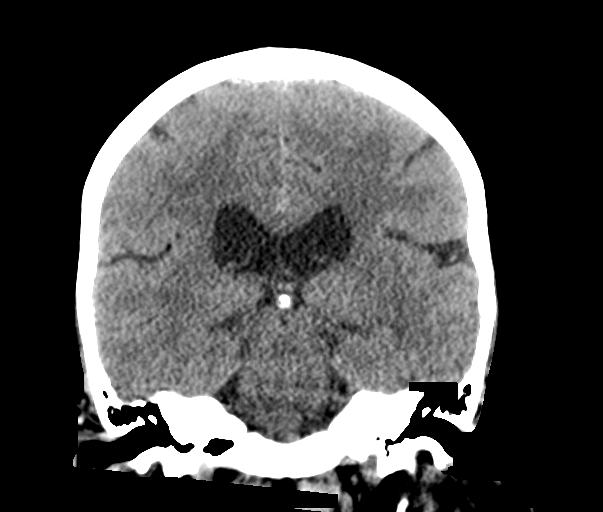
[im 39/70  brain]
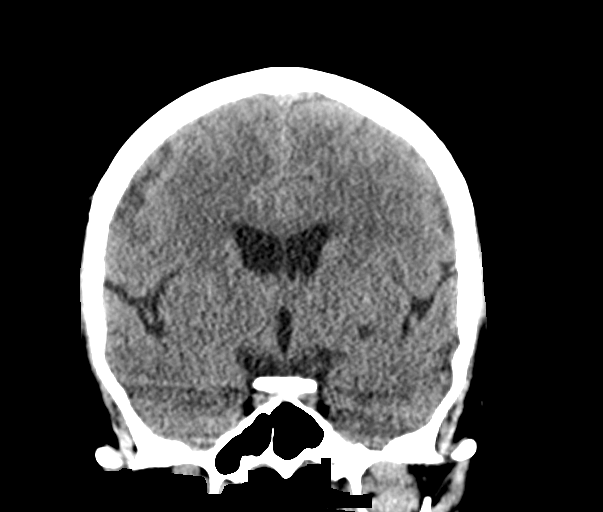

[Series 5: sagittal soft · sagittal · 0.35mm/px · 3 of 55 slices shown]
[im 19/55  brain]
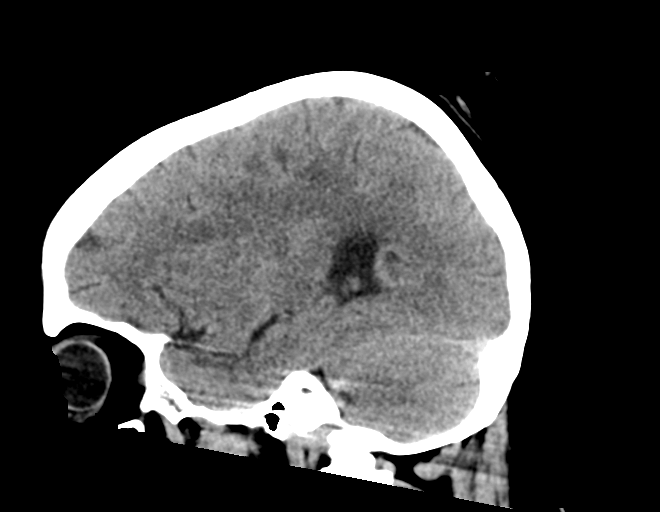
[im 28/55  brain]
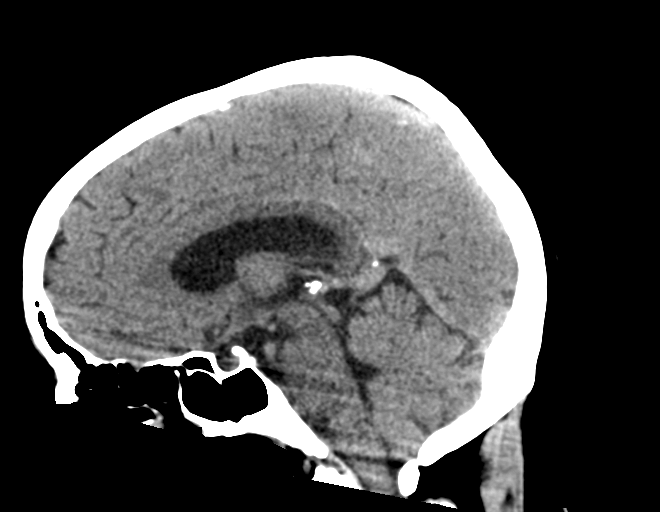
[im 36/55  brain]
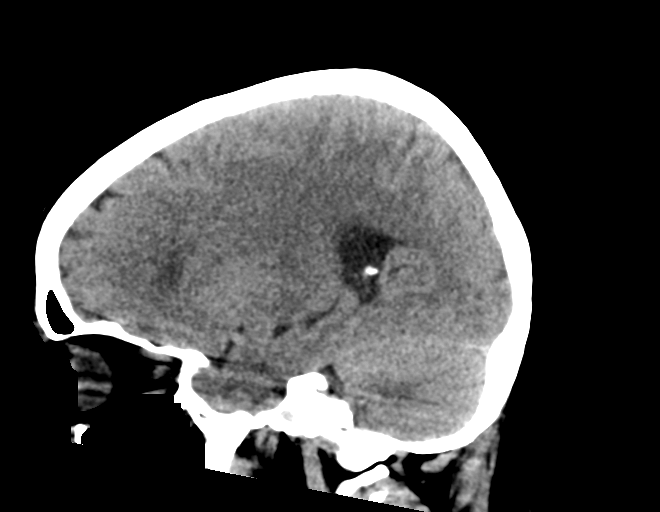

[15 of 47 positions shown; findings below may reference images not displayed]

FINDINGS: CT HEAD FINDINGS

Brain: Normal ventricular morphology. No midline shift or mass
effect. Minimal small vessel chronic ischemic changes of deep
cerebral white matter. Otherwise normal appearance of brain
parenchyma. No intracranial hemorrhage, mass lesion, or evidence of
acute infarction. No extra-axial fluid collections.

Vascular: No hyperdense vessels.

Skull: Calvaria intact

Sinuses/Orbits: Clear

Other: N/A

CT CERVICAL SPINE FINDINGS

Alignment: Normal

Skull base and vertebrae: Osseous mineralization normal. Vertebral
body and disc space heights maintained. No fracture, subluxation, or
bone destruction.

Soft tissues and spinal canal: Prevertebral soft tissues normal
thickness. RIGHT thyroid nodule 3.5 x 2.6 cm; recommend follow-up
non emergent thyroid US (ref: [HOSPITAL]. [DATE]):
143-50). No additional abnormalities.

Disc levels:  Unremarkable

Upper chest: Lung apices clear

Other: N/A
IMPRESSION: Minimal small vessel chronic ischemic changes of deep cerebral white
matter.

No acute intracranial abnormalities.

No acute cervical spine abnormalities.

RIGHT thyroid nodule 3.5 x 2.6 cm; follow-up non emergent thyroid
ultrasound recommended as above.

## 2022-09-17 ENCOUNTER — Encounter: Payer: Self-pay | Admitting: *Deleted

## 2022-09-17 ENCOUNTER — Encounter: Payer: Self-pay | Admitting: Family Medicine

## 2022-09-18 ENCOUNTER — Other Ambulatory Visit: Payer: Self-pay

## 2022-09-18 MED ORDER — LOSARTAN POTASSIUM 50 MG PO TABS: ORAL_TABLET | ORAL | 1 refills | Status: AC

## 2022-09-23 ENCOUNTER — Encounter (HOSPITAL_COMMUNITY): Payer: Medicaid Other | Admitting: Occupational Therapy

## 2022-09-24 ENCOUNTER — Encounter: Payer: Self-pay | Admitting: Obstetrics & Gynecology

## 2022-09-24 ENCOUNTER — Ambulatory Visit (INDEPENDENT_AMBULATORY_CARE_PROVIDER_SITE_OTHER): Payer: Medicaid Other | Admitting: Obstetrics & Gynecology

## 2022-09-24 ENCOUNTER — Encounter: Payer: Self-pay | Admitting: Orthopedic Surgery

## 2022-09-24 VITALS — BP 117/75 | HR 85

## 2022-09-24 DIAGNOSIS — N951 Menopausal and female climacteric states: Secondary | ICD-10-CM

## 2022-09-24 NOTE — Progress Notes (Signed)
Follow up appointment for results: Response to [facility]  Chief Complaint  Patient presents with   Follow-up    On Veozah    Blood pressure 117/75, pulse 85, last menstrual period 08/25/2019.  No results found.  Begun on veozah 6 weeks ago with "80% response" "significantly" improved  Defintiely wants to continue  Sleeps better  MEDS ordered this encounter: No orders of the defined types were placed in this encounter.   Orders for this encounter: No orders of the defined types were placed in this encounter.   Impression + Management Plan   ICD-10-CM   1. Vasomotor symptoms due to menopause-->great response to veozah 45 mg qhs, continue  N95.1       Follow Up: Return in about 1 year (around 09/25/2023) for Follow up, with Dr Elonda Husky.     All questions were answered.  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   Hypertension    Hypothyroidism    Kidney disease, chronic, stage III (moderate, EGFR 30-59 ml/min) (HCC)    Movement disorder    PTSD (post-traumatic stress disorder)    Restless leg syndrome    Sciatica of right side    Spondylosis    Cervical and lumbar   Tortuous colon     Past Surgical History:  Procedure Laterality Date   BIOPSY  04/29/2022   Procedure: BIOPSY;  Surgeon: Harvel Quale, MD;  [facility];  Service: Gastroenterology;;   [place] RELEASE Bilateral    COLONOSCOPY WITH PROPOFOL N/A 04/29/2022   Procedure: COLONOSCOPY WITH PROPOFOL;  Surgeon: Harvel Quale, MD;  [facility];  Service: Gastroenterology;  Laterality: N/A;  145   ECTOPIC PREGNANCY SURGERY     ESOPHAGOGASTRODUODENOSCOPY (EGD) WITH PROPOFOL N/A 04/29/2022   Procedure: ESOPHAGOGASTRODUODENOSCOPY (EGD) WITH PROPOFOL;  Surgeon: Harvel Quale, MD;  [facility];   Service: Gastroenterology;  Laterality: N/A;   SHOULDER SURGERY Right    SHOULDER SURGERY Right    TUBAL LIGATION      OB History     Gravida  3   Para  3   Term  3   Preterm      AB      Living  3      SAB      IAB      Ectopic      Multiple      Live Births              Allergies  Allergen Reactions   Codeine Nausea And Vomiting    High doses of codeine pt reports greater than '30mg'$  she can't tolerate    Methocarbamol Itching    "feels like bugs are crawling under skin"   Nsaids     Other reaction(s): Contraindicated CKD, only one kidney Other reaction(s): Contraindicated CKD, only one kidney    Social History   Socioeconomic History   Marital status: Single    Spouse name: Not on file   Number of children: 3   Years of education: Not on file   Highest education level: GED or equivalent  Occupational History   Not on file  Tobacco Use   Smoking status: Former    Types: Cigarettes    Quit date: 08/25/1995    Years since quitting: 26.1  Passive exposure: Past   Smokeless tobacco: Never  Vaping Use   Vaping Use: Never used  Substance and Sexual Activity   Alcohol use: No   Drug use: Not Currently    Types: "Crack" cocaine    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   Sexual activity: Not Currently    Birth control/protection: Surgical  Other Topics Concern   Not on file  Social History Narrative   Not on file   Social Determinants of Health   Financial Resource Strain: Low Risk  (08/10/2022)   Overall Financial Resource Strain (CARDIA)    Difficulty of Paying Living Expenses: Not hard at all  Food Insecurity: [place] Present (08/10/2022)   Hunger Vital Sign    Worried About Running Out of Food in the Last Year: Never true    [place] in the Last Year: Sometimes true  Transportation Needs: Unmet Transportation Needs (08/10/2022)   PRAPARE - Transportation    Lack of Transportation (Medical):  Yes    Lack of Transportation (Non-Medical): Yes  Physical Activity: Insufficiently Active (08/10/2022)   Exercise Vital Sign    Days of Exercise per Week: 3 days    Minutes of Exercise per Session: 20 min  Stress: Stress Concern Present (08/10/2022)   [place]    Feeling of Stress : Very much  Social Connections: Socially Isolated (08/10/2022)   Social Connection and Isolation Panel [NHANES]    Frequency of Communication with Friends and Family: Three times a week    Frequency of Social Gatherings with Friends and Family: Twice a week    Attends Religious Services: Never    Marine scientist or Organizations: No    Attends Archivist Meetings: Never    Marital Status: Divorced    Family History  Problem Relation Age of Onset   Hypertension Father    Stroke Father    Hypertension Sister    Dystonia Sister

## 2022-10-05 ENCOUNTER — Encounter: Payer: Self-pay | Admitting: Family Medicine

## 2022-10-07 ENCOUNTER — Other Ambulatory Visit: Payer: Medicaid Other | Admitting: Pharmacist

## 2022-10-07 NOTE — Progress Notes (Signed)
10/07/2022 Name: Briana Wyatt MRN: [id number] DOB: 1971-03-20  Chief Complaint  Patient presents with   Patient Education    Briana Wyatt is a 52 y.o. year old female who presented for a telephone visit.   They were referred to the pharmacist by their PCP for assistance in managing medication access.   Patient is participating in a Managed Medicaid Plan:  Yes  Subjective:  Care Team: Primary Care Provider: Alvira Monday, FNP   Medication Access/Adherence  Current Pharmacy:  [facility], [address] [place] [address] Phone: [phone] Fax: [phone]  [place], [place] - [place] [address] [place] [address] [place] [address] Phone: [phone] Fax: [phone]   Patient reports affordability concerns with their medications: No  Patient reports access/transportation concerns to their pharmacy: Yes  Patient reports adherence concerns with their medications:  No      Objective:  Lab Results  Component Value Date   HGBA1C 5.9 (H) 08/10/2022    Lab Results  Component Value Date   CREATININE 0.77 08/10/2022   BUN 23 08/10/2022   NA 140 08/10/2022   K 4.5 08/10/2022   CL 103 08/10/2022   CO2 20 08/10/2022    Lab Results  Component Value Date   CHOL 217 (H) 08/10/2022   HDL 43 08/10/2022   LDLCALC 92 08/10/2022   TRIG 498 (H) 08/10/2022   CHOLHDL 5.0 (H) 08/10/2022    Medications Reviewed Today     Reviewed by Briana Buff, MD (Physician) on 09/24/22 at 14  Med List Status: <None>   Medication Order Taking? Sig Documenting Provider Last Dose Status Informant  acetaminophen (TYLENOL) 650 MG CR tablet [id number] Yes Take 650 mg by mouth every 8 (eight) hours as needed for pain or fever. [provider] Taking Active Self, Pharmacy Records  Apple Cider Vinegar 500 MG TABS [id number] Yes  [provider] Taking Active   ARIPiprazole (ABILIFY) 2 MG tablet [id number] Yes Take 2 mg by mouth daily. [provider] Taking Active   Ascorbic Acid (VITAMIN C) 500 MG CHEW [id number] Yes  [provider] Taking Active   Calcium Carbonate-Vitamin D (CALTRATE 600+D PO) [id number] Yes Take by mouth. [provider] Taking Active Self, Pharmacy Records  cetirizine (ZYRTEC) 10 MG tablet [id number] Yes Take 10 mg by mouth daily. [provider] Taking Active Self, Pharmacy Records  Cholecalciferol (VITAMIN D-3 PO) [id number] Yes Take 1 tablet by mouth daily. [provider] Taking Active Self, Pharmacy Records  DULoxetine (CYMBALTA) 30 MG capsule [id number]  Take 1 capsule (30 mg total) by mouth 2 (two) times daily. Janine Limbo, MD  Expired 07/22/22 2359   Erenumab-aooe (AIMOVIG, 140 MG DOSE, [place]) [id number] Yes One a month [provider] Taking Active Self, Pharmacy Records  Fezolinetant [facility]) 45 MG TABS [id number] Yes Take 1 tablet by mouth at bedtime. Briana Buff, MD Taking Active   hydrOXYzine (ATARAX) 25 MG tablet [id number] Yes Take by mouth. [provider] Taking Active   losartan (COZAAR) 50 MG tablet [id number] Yes TAKE 1 Tablet BY MOUTH ONCE EVERY DAY Briana Monday, FNP Taking Active   Multiple Vitamins-Minerals (CENTRUM SILVER PO) [id number] Yes Take 1 tablet by mouth daily. [provider] Taking Active Self, Pharmacy Records  omeprazole (PRILOSEC) 40 MG capsule [id number] Yes TAKE 1 Capsule BY MOUTH ONCE EVERY DAY  Briana Monday, FNP Taking Active   tiZANidine (ZANAFLEX) 2 MG tablet [id number] Yes Take 2 mg by mouth 2 (two) times daily. [provider] Taking Active Self, Pharmacy Records              Assessment/Plan:   Patient pleasantly declined medication needs or management.  She reports her medications are covered and accessible under Medicaid.  Reviewed medications.  No new allergies.  She  reports transportation issues for which her SW is assisting  Follow Up Plan: Encouraged patient to reach out if needed arise    Briana Wyatt, PharmD, BCPS, [place]

## 2022-10-10 ENCOUNTER — Encounter: Payer: Self-pay | Admitting: Family Medicine

## 2022-10-10 ENCOUNTER — Encounter: Payer: Self-pay | Admitting: Internal Medicine

## 2022-10-12 ENCOUNTER — Other Ambulatory Visit: Payer: Self-pay | Admitting: Family Medicine

## 2022-10-12 DIAGNOSIS — G8929 Other chronic pain: Secondary | ICD-10-CM

## 2022-10-12 NOTE — Telephone Encounter (Signed)
Ref placed to pain management

## 2022-10-12 NOTE — Telephone Encounter (Signed)
Kindly inform the patient to schedule an appt

## 2022-10-20 ENCOUNTER — Ambulatory Visit: Payer: Medicaid Other | Admitting: Family Medicine

## 2022-10-21 ENCOUNTER — Encounter: Payer: Self-pay | Admitting: Family Medicine

## 2022-10-21 ENCOUNTER — Encounter: Payer: Self-pay | Admitting: Obstetrics & Gynecology

## 2022-10-21 ENCOUNTER — Ambulatory Visit: Payer: Medicaid Other | Admitting: Family Medicine

## 2022-10-22 ENCOUNTER — Encounter: Payer: Self-pay | Admitting: Radiology

## 2022-10-28 ENCOUNTER — Encounter: Payer: Self-pay | Admitting: Family Medicine

## 2022-10-28 ENCOUNTER — Ambulatory Visit: Payer: Medicaid Other | Admitting: Family Medicine

## 2022-11-04 ENCOUNTER — Telehealth: Payer: Self-pay | Admitting: *Deleted

## 2022-11-04 NOTE — Telephone Encounter (Signed)
[place] 45 mg samples given. 2 boxes. Lot # [id number] exp 03/2023. Pt to pick up at front desk. [place]

## 2022-11-06 ENCOUNTER — Other Ambulatory Visit: Payer: Self-pay | Admitting: *Deleted

## 2022-11-06 MED ORDER — VEOZAH 45 MG PO TABS: 1.0000 | ORAL_TABLET | Freq: Every day | ORAL | 11 refills | Status: DC

## 2022-11-16 ENCOUNTER — Encounter: Payer: Self-pay | Admitting: Obstetrics & Gynecology

## 2022-11-18 ENCOUNTER — Encounter: Payer: Self-pay | Admitting: *Deleted

## 2022-11-19 ENCOUNTER — Encounter: Payer: Self-pay | Admitting: Family Medicine

## 2022-11-19 ENCOUNTER — Telehealth: Payer: Self-pay | Admitting: *Deleted

## 2022-11-19 ENCOUNTER — Ambulatory Visit: Payer: Medicaid Other | Admitting: Family Medicine

## 2022-11-19 ENCOUNTER — Telehealth: Payer: Self-pay | Admitting: Family Medicine

## 2022-11-19 NOTE — Telephone Encounter (Signed)
Pt talked with [facility]. She has to fill out some paperwork. 2 sample boxes given. Lot # [id number] exp 03/2023. [place]

## 2022-11-19 NOTE — Telephone Encounter (Signed)
Kindly proceed with the clinic policy, given the patient has been fully aware of the clinic's policy and subsequent actions. Kindly inform the patient that I will refill medications prescribed by me for 30 days.

## 2022-11-19 NOTE — Telephone Encounter (Signed)
Pt has missed 3 appts (10/21/22, 10/28/22 & 11/19/22). How would you like to proceed?

## 2022-11-20 ENCOUNTER — Ambulatory Visit: Payer: Medicaid Other | Admitting: Family Medicine

## 2022-11-25 ENCOUNTER — Encounter: Payer: Self-pay | Admitting: *Deleted

## 2022-11-30 ENCOUNTER — Encounter: Payer: Self-pay | Admitting: Obstetrics & Gynecology

## 2022-12-01 ENCOUNTER — Other Ambulatory Visit: Payer: Self-pay | Admitting: Neurosurgery

## 2022-12-01 ENCOUNTER — Telehealth: Payer: Self-pay | Admitting: *Deleted

## 2022-12-01 NOTE — Telephone Encounter (Signed)
Briana Wyatt has been approved with a $30.00 copay. Pt aware. Case # [id number]. JSY

## 2022-12-02 ENCOUNTER — Ambulatory Visit: Payer: Medicaid Other | Admitting: Orthopedic Surgery

## 2022-12-02 ENCOUNTER — Other Ambulatory Visit: Payer: Self-pay | Admitting: Neurosurgery

## 2022-12-02 DIAGNOSIS — G43909 Migraine, unspecified, not intractable, without status migrainosus: Secondary | ICD-10-CM | POA: Insufficient documentation

## 2022-12-02 DIAGNOSIS — D259 Leiomyoma of uterus, unspecified: Secondary | ICD-10-CM | POA: Insufficient documentation

## 2022-12-04 ENCOUNTER — Ambulatory Visit: Payer: Medicaid Other | Admitting: Family Medicine

## 2022-12-09 NOTE — Pre-Procedure Instructions (Signed)
Surgical Instructions    Your procedure is scheduled on December 16, 2022.  Report to [facility] Main Entrance "A" at 5:30 A.M., then check in with the Admitting office.  Call this number if you have problems the morning of surgery:  [phone]  If you have any questions prior to your surgery date call [phone]: Open Monday-Friday 8am-4pm If you experience any cold or flu symptoms such as cough, fever, chills, shortness of breath, etc. between now and your scheduled surgery, please notify us at the above number.     Remember:  Do not eat or drink after midnight the night before your surgery     Take these medicines the morning of surgery with A SIP OF WATER:  ARIPiprazole (ABILIFY)   cetirizine (ZYRTEC)   DULoxetine (CYMBALTA)   omeprazole (PRILOSEC)   tiZANidine (ZANAFLEX)    May take these medicines IF NEEDED:  acetaminophen (TYLENOL)  hydrOXYzine (ATARAX)    As of today, STOP taking any Aspirin (unless otherwise instructed by your surgeon) Aleve, Naproxen, Ibuprofen, Motrin, Advil, Goody's, BC's, all herbal medications, fish oil, and all vitamins.                     Do NOT Smoke (Tobacco/Vaping) for 24 hours prior to your procedure.  If you use a CPAP at night, you may bring your mask/headgear for your overnight stay.   Contacts, glasses, piercing's, hearing aid's, dentures or partials may not be worn into surgery, please bring cases for these belongings.    For patients admitted to the hospital, discharge time will be determined by your treatment team.   Patients discharged the day of surgery will not be allowed to drive home, and someone needs to stay with them for 24 hours.  SURGICAL WAITING ROOM VISITATION Patients having surgery or a procedure may have no more than 2 support people in the waiting area - these visitors may rotate.   Children under the age of 62 must have an adult with them who is not the patient. If the patient needs to stay at the hospital  during part of their recovery, the visitor guidelines for inpatient rooms apply. Pre-op nurse will coordinate an appropriate time for 1 support person to accompany patient in pre-op.  This support person may not rotate.   Please refer to the [facility] website for the visitor guidelines for Inpatients (after your surgery is over and you are in a regular room).    Special instructions:   [facility]- Preparing For Surgery  Before surgery, you can play an important role. Because skin is not sterile, your skin needs to be as free of germs as possible. You can reduce the number of germs on your skin by washing with CHG (chlorahexidine gluconate) Soap before surgery.  CHG is an antiseptic cleaner which kills germs and bonds with the skin to continue killing germs even after washing.    Oral Hygiene is also important to reduce your risk of infection.  Remember - BRUSH YOUR TEETH THE MORNING OF SURGERY WITH YOUR REGULAR TOOTHPASTE  Please do not use if you have an allergy to CHG or antibacterial soaps. If your skin becomes reddened/irritated stop using the CHG.  Do not shave (including legs and underarms) for at least 48 hours prior to first CHG shower. It is OK to shave your face.  Please follow these instructions carefully.   Shower the NIGHT BEFORE SURGERY and the MORNING OF SURGERY  If you chose to wash your  hair, wash your hair first as usual with your normal shampoo.  After you shampoo, rinse your hair and body thoroughly to remove the shampoo.  Use CHG Soap as you would any other liquid soap. You can apply CHG directly to the skin and wash gently with a scrungie or a clean washcloth.   Apply the CHG Soap to your body ONLY FROM THE NECK DOWN.  Do not use on open wounds or open sores. Avoid contact with your eyes, ears, mouth and genitals (private parts). Wash Face and genitals (private parts)  with your normal soap.   Wash thoroughly, paying special attention to the area where your surgery  will be performed.  Thoroughly rinse your body with warm water from the neck down.  DO NOT shower/wash with your normal soap after using and rinsing off the CHG Soap.  Pat yourself dry with a CLEAN TOWEL.  Wear CLEAN PAJAMAS to bed the night before surgery  Place CLEAN SHEETS on your bed the night before your surgery  DO NOT SLEEP WITH PETS.   Day of Surgery: Take a shower with CHG soap. Do not wear jewelry or makeup Do not wear lotions, powders, perfumes/colognes, or deodorant. Do not shave 48 hours prior to surgery.  Men may shave face and neck. Do not bring valuables to the hospital.  [facility] is not responsible for any belongings or valuables. Do not wear nail polish, gel polish, artificial nails, or any other type of covering on natural nails (fingers and toes) If you have artificial nails or gel coating that need to be removed by a nail salon, please have this removed prior to surgery. Artificial nails or gel coating may interfere with anesthesia's ability to adequately monitor your vital signs.\  Wear Clean/Comfortable clothing the morning of surgery Remember to brush your teeth WITH YOUR REGULAR TOOTHPASTE.   Please read over the following fact sheets that you were given.    If you received a COVID test during your pre-op visit  it is requested that you wear a mask when out in public, stay away from anyone that may not be feeling well and notify your surgeon if you develop symptoms. If you have been in contact with anyone that has tested positive in the last 10 days please notify you surgeon.

## 2022-12-10 ENCOUNTER — Encounter (HOSPITAL_COMMUNITY): Payer: Self-pay

## 2022-12-10 ENCOUNTER — Telehealth: Payer: Self-pay | Admitting: *Deleted

## 2022-12-10 ENCOUNTER — Other Ambulatory Visit: Payer: Self-pay

## 2022-12-10 ENCOUNTER — Encounter (HOSPITAL_COMMUNITY)
Admission: RE | Admit: 2022-12-10 | Discharge: 2022-12-10 | Disposition: A | Discharge status: Home / Self Care | Payer: Medicaid Other | Source: Ambulatory Visit | Attending: Neurosurgery | Admitting: Neurosurgery

## 2022-12-10 VITALS — BP 138/97 | HR 95 | Temp 97.9°F | Resp 18 | Ht 65.0 in | Wt 204.5 lb

## 2022-12-10 DIAGNOSIS — D259 Leiomyoma of uterus, unspecified: Secondary | ICD-10-CM | POA: Diagnosis not present

## 2022-12-10 DIAGNOSIS — J449 Chronic obstructive pulmonary disease, unspecified: Secondary | ICD-10-CM | POA: Insufficient documentation

## 2022-12-10 DIAGNOSIS — N183 Chronic kidney disease, stage 3 unspecified: Secondary | ICD-10-CM | POA: Insufficient documentation

## 2022-12-10 DIAGNOSIS — M797 Fibromyalgia: Secondary | ICD-10-CM | POA: Diagnosis not present

## 2022-12-10 DIAGNOSIS — G2581 Restless legs syndrome: Secondary | ICD-10-CM | POA: Insufficient documentation

## 2022-12-10 DIAGNOSIS — K7581 Nonalcoholic steatohepatitis (NASH): Secondary | ICD-10-CM | POA: Diagnosis not present

## 2022-12-10 DIAGNOSIS — Z01812 Encounter for preprocedural laboratory examination: Secondary | ICD-10-CM | POA: Diagnosis present

## 2022-12-10 DIAGNOSIS — F1910 Other psychoactive substance abuse, uncomplicated: Secondary | ICD-10-CM | POA: Insufficient documentation

## 2022-12-10 DIAGNOSIS — Z87891 Personal history of nicotine dependence: Secondary | ICD-10-CM | POA: Diagnosis not present

## 2022-12-10 DIAGNOSIS — G629 Polyneuropathy, unspecified: Secondary | ICD-10-CM | POA: Diagnosis not present

## 2022-12-10 DIAGNOSIS — E039 Hypothyroidism, unspecified: Secondary | ICD-10-CM | POA: Diagnosis not present

## 2022-12-10 DIAGNOSIS — R7303 Prediabetes: Secondary | ICD-10-CM | POA: Insufficient documentation

## 2022-12-10 DIAGNOSIS — K219 Gastro-esophageal reflux disease without esophagitis: Secondary | ICD-10-CM | POA: Insufficient documentation

## 2022-12-10 DIAGNOSIS — M47816 Spondylosis without myelopathy or radiculopathy, lumbar region: Secondary | ICD-10-CM | POA: Diagnosis not present

## 2022-12-10 DIAGNOSIS — Z01818 Encounter for other preprocedural examination: Secondary | ICD-10-CM

## 2022-12-10 DIAGNOSIS — I129 Hypertensive chronic kidney disease with stage 1 through stage 4 chronic kidney disease, or unspecified chronic kidney disease: Secondary | ICD-10-CM | POA: Insufficient documentation

## 2022-12-10 HISTORY — DX: Chronic obstructive pulmonary disease, unspecified: J44.9

## 2022-12-10 HISTORY — DX: Other psychoactive substance abuse, uncomplicated: F19.10

## 2022-12-10 HISTORY — DX: Headache, unspecified: R51.9

## 2022-12-10 HISTORY — DX: Fibromyalgia: M79.7

## 2022-12-10 HISTORY — DX: Dyspnea, unspecified: R06.00

## 2022-12-10 HISTORY — DX: Fatty (change of) liver, not elsewhere classified: K76.0

## 2022-12-10 HISTORY — DX: Prediabetes: R73.03

## 2022-12-10 LAB — CBC
HCT: 39.2 % (ref 36.0–46.0)
Hemoglobin: 13.1 g/dL (ref 12.0–15.0)
MCH: 27.5 pg (ref 26.0–34.0)
MCHC: 33.4 g/dL (ref 30.0–36.0)
MCV: 82.4 fL (ref 80.0–100.0)
Platelets: 229 10*3/uL (ref 150–400)
RBC: 4.76 MIL/uL (ref 3.87–5.11)
RDW: 12.9 % (ref 11.5–15.5)
WBC: 6.9 10*3/uL (ref 4.0–10.5)
nRBC: 0 % (ref 0.0–0.2)

## 2022-12-10 LAB — COMPREHENSIVE METABOLIC PANEL
ALT: 31 U/L (ref 0–44)
AST: 31 U/L (ref 15–41)
Albumin: 3.5 g/dL (ref 3.5–5.0)
Alkaline Phosphatase: 92 U/L (ref 38–126)
Anion gap: 10 (ref 5–15)
BUN: 23 mg/dL — ABNORMAL HIGH (ref 6–20)
CO2: 24 mmol/L (ref 22–32)
Calcium: 9.5 mg/dL (ref 8.9–10.3)
Chloride: 107 mmol/L (ref 98–111)
Creatinine, Ser: 1.01 mg/dL — ABNORMAL HIGH (ref 0.44–1.00)
GFR, Estimated: >60 mL/min (ref >60)
Glucose, Bld: 118 mg/dL — ABNORMAL HIGH (ref 70–99)
Potassium: 3.8 mmol/L (ref 3.5–5.1)
Sodium: 141 mmol/L (ref 135–145)
Total Bilirubin: 0.5 mg/dL (ref 0.3–1.2)
Total Protein: 6.3 g/dL — ABNORMAL LOW (ref 6.5–8.1)

## 2022-12-10 NOTE — Progress Notes (Signed)
PCP - Gilmore Laroche, FNP (Will change to [place] [facility] at [facility] as of June 1) Cardiologist - Saw. Dr. Dietrich Pates in 2023 for CP  PPM/ICD - Denies Device Orders - n/a Rep Notified - n/a  Chest x-ray - n/a EKG - 04/02/2022 Stress Test - Denies ECHO - 01/08/2022 Cardiac Cath - Denies Coronary CT - 02/02/2022  Sleep Study - Denies CPAP - n/a  No DM  Last dose of GLP1 agonist- n/a GLP1 instructions: n/a  Blood Thinner Instructions: n/a Aspirin Instructions: n/a  NPO after midnight   COVID TEST- n/a   Anesthesia review: Yes. Pt had echocardiogram done 2023 with no follow-up note seen from Dr. Tenny Craw. Shonna Chock, PA-C discussed this with patient during PAT visit  Patient denies shortness of breath, fever, cough and chest pain at PAT appointment. Pt endorses a runny nose that is from a deviated septum that is an on-going symptom. Pt instructed to let us know iif she develops any additional symptoms like productive cough, fever and/or sore throat. Pt understood instructions.   All instructions explained to the patient, with a verbal understanding of the material. Patient agrees to go over the instructions while at home for a better understanding. Patient also instructed to self quarantine after being tested for COVID-19. The opportunity to ask questions was provided.

## 2022-12-10 NOTE — Telephone Encounter (Signed)
   Pre-operative Risk Assessment    Patient Name: Briana Wyatt  DOB: [birth year] MRN: [id number]      Request for Surgical Clearance    Procedure:   ULNAR NERVE RELEASE  Date of Surgery:  Clearance 12/16/22                                 Surgeon:  DR. Lisbeth Renshaw Surgeon's Group or Practice Name:  [facility] Phone number:  [phone] Fax number:  [phone] ATTN: [id number]   Type of Clearance Requested:   - Medical ; NO MEDICATIONS LISTED AS NEEDING TO BE HELD   Type of Anesthesia:  General    Additional requests/questions:    Elpidio Anis   12/10/2022, 6:02 PM

## 2022-12-10 NOTE — Progress Notes (Signed)
Med Rec was not complete at time of PAT appointment. Pt stated that she can more accurately review medications if they were in front of her. RN gave pt the Pharmacy Call Center phone number and told her to call today or tomorrow to review all medications with them. Pt understood instructions

## 2022-12-10 NOTE — Progress Notes (Addendum)
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  History includes former smoker (quit 08/25/95), HTN, CKD (stage 3), congenital single kidney (chronic left renal atrophy 05/21/22 CT), COPD, dyspnea, PTSD, pre-diabetes, fatty liver, fibromyalgia, hypothyroidism, acid reflux, RLS, movement disorder, substance abuse (crack cocaine), depression (SI/SA 2023).  She had cardiologist evaluation by Dietrich Pates, MD on 11/12/21 for chest pain evaluation. Had been seen in ED on 09/07/21 and was also having body aches and migraine. Known history of crack cocaine use. She ruled out for ACS. Dr. Tenny Craw thought chest symptoms were more consistent with musculoskeletal. Echo ordered due to chronic dyspnea and done on 11/28/21 showing lateral wall hypokinesis, LVEF 50-55%, mildly dilated LV cavity, normal RVSF, trivial MR. A limited echo with Definity was ordered to further evaluate EF and wall motion and done on 01/08/22 showing LVEF 50-55% with hypokinesis of the inferolateral/lateral wall, low normal RVSF. A CCTA was then ordered and done on 02/02/22 showing Coronary Calcium Score of 0 with no evidence of CAD. Cardiology follow-up recommendations were not specified.   She denied chest pain. She says her exertional dyspnea is about the same. She is able to do ADLs, but would likely have to stop and use her inhaler if she had to walk a further distance like to the parking deck. She suffers from chronic pain, but says she tries to walk about 20 minutes a few times a week as her provider recommended. She uses her inhaler about once a week. She says a sleep study was planned because she will wake up about three times a week  feeling like she can't catch her breath. She otherwise says she can tolerate lying flat for short time frames such as about 30 minutes. I encouraged her to follow through with getting the sleep study scheduled.   She is currently living with her mom. She continues to struggle with drug use. Last crack cocaine was about a month ago.  She says she is trigger to buy when she gets money, so she now has her mother managing her finances. We discussed she should refrain from use prior to surgery due to associated risks.   She has chronic DOE which she feels is stable since last year. Wall motion abnormality with mildly depressed LVEF in 2023. Unclear cardiology follow-up recommendations. She is on ARB therapy. No CAD by CCTA. Discussed case with anesthesiologist Arrie Aran, MD. Recommend preoperative cardiology input. I discussed this with the patient and notified Nikki at Dr. Val Riles office. Lowella Bandy will reach out to Dr. Tenny Craw' office for input/recommendations.    ADDENDUM 12/14/22 10:46 AM: Patient had a preoperative cardiology evaluation this morning by Gillian Shields, PA-C. She did not recommend ischemic evaluation. She wrote, "Per AHA/ACC guidelines, she is deemed acceptable risk for the planned procedure without additional cardiovascular testing." Appears sleep study was discussed. Follow-up in 3 months recommended.     Anesthesia team to evaluate on the day of surgery.   VS: BP (!) 138/97   Pulse 95   Temp 36.6 C (Oral)   Resp 18   Ht  (1.651 m)   Wt 92.8 kg   LMP 08/25/2019  SpO2 98%   BMI 34.03 kg/m  Heart RRR, no murmur noted. Lungs were clear, but faint bilateral expiratory wheeze noted bilateral neck. She was not having any conversational dyspnea or increased work of breathing, unremarkable voice quality. No carotid bruits noted. Patent airways 02/02/22 CT and no mention of tracheal deviation or stenosis on recent c-spine or thyroid imaging.)  No known difficult intubation  history.    PROVIDERSGilmore Laroche, FNP was her PCP, but recently dismissed due to 3 no show appointments in one months. She says she is scheduled to get established with Coral Ceo, NP at [facility] in [place] within the next few months - Dietrich Pates, MD is cardiologist  - Duane Lope, MD is GYN - Marguerita Merles, Reuel Boom, MD is GI - Ronny Bacon, NP is endocrinology provider. Last visit 07/21/22 for follow-up right thyroid nodule and previously abnormal (low)TSH which had resolved. Stable thyroid US 08/12/22 and normal thyroid panel 08/10/22. One year follow-up planned.    LABS: Labs reviewed: Acceptable for surgery.  A1c 5.9% on 08/10/2022.  Nonreactive hepatitis C and HIV antibody on 08/10/2022. (all labs ordered are listed, but only abnormal results are displayed)  Labs Reviewed  COMPREHENSIVE METABOLIC PANEL - Abnormal; Notable for the following components:      Result Value   Glucose, Bld 118 (*)    BUN 23 (*)    Creatinine, Ser 1.01 (*)    Total Protein 6.3 (*)    All other components within normal limits  CBC     IMAGES: US Thyroid 08/12/22: IMPRESSION: 1. Similar to slight interval enlargement of recently biopsied nodule in the right mid gland. Recommend correlation with prior biopsy results. 2. No new nodules or suspicious features. - S/p Right thyroid nodule FNA 11/13/21: Atypia of undetermined significance (Bethesda category III)   CT C-spine 08/08/22: IMPRESSION: No acute fracture or traumatic listhesis of the cervical spine.   CT L-spine 08/08/22: IMPRESSION: 1. No acute fracture or traumatic malalignment of the lumbar spine. 2. Mild lumbar spondylosis.  CT Abd/pelvis 05/21/22: IMPRESSION: 1. No acute abnormality identified in the abdomen or pelvis. 2. Diffuse hepatic steatosis which in the setting of steatohepatitis can be a cause of abdominal pain. 3. Chronic left renal atrophy. 4. Uterine leiomyomas.   CT Chest (overread  from CCTA) 02/02/22: IMPRESSION: 1. Mild basilar atelectasis. 2. Hepatic steatosis difficult to quantify on the current study. Correlate with any clinical or laboratory evidence of hepatic dysfunction or with signs of metabolic syndrome.    OTHER: EGD 04/29/22: IMPRESSIONS: 1.  Esophageal mucosal changes suspicious for short segment Barrett's esophagus, classified as Barrett's stage C0-M1 per Prague criteria.  Biopsied. [Squamocolumnar junction mucosa with intestinal metaplasia consistent with Barrett's esophagus, negative for dysplasia] 2.  2 cm hiatal hernia. 3.  Gastroesophageal flap valve classified as Hill Grade II (fold present, opens with respiration). 4.  Erosive gastropathy with no stigmata of recent bleeding.  Biopsied. [Antral type mucosa with intestinal metaplasia, negative for dysplasia, no H. pylori organisms identified] 5.  Normal examined duodenum.  Colonoscopy 04/29/22: IMPRESSIONS: 1.  Hemorrhoids found on perianal exam. 2.  One 2 mm polyp in the transverse colon, removed with cold biopsy forceps.  Resected and retrieved. [Hyperplastic polyp] 3.  Tortuous colon. 4.  Nonbleeding external and internal hemorrhoids.   EKG: EKG 04/01/22: Normal sinus rhythm Moderate voltage criteria for LVH, may be normal variant ( R in aVL , Cornell product ) When compared with ECG of 31-Mar-2022 00:28, PREVIOUS ECG IS  PRESENT Confirmed by Alvester Chou [phone]) on 04/01/2022 2:27:55 PM   CV: CT Coronary 02/02/22: IMPRESSION: 1. Coronary calcium score of 0. This was 0 percentile for age and sex matched control. 2. Normal coronary origin with right dominance. 3. No evidence of CAD. CAD-RADS 0. No evidence of CAD (0%). Consider non-atherosclerotic causes of chest pain. - Reviewed by Dr. Tenny Craw, "CT of heart shows no signfiicant coronary artery plaquing Her calcium score is 0      Very good   CT scan does show fatty liver changes   Hepatic steatosis.   Needs to cut out sugars (less than  24 g added sugar per day), cut out alcohol if drinking    Low carb Send to PCP to review"   Echo (limited with Definity) 01/08/22: IMPRESSIONS   1. Limited images with Definity Acoustic windows difficult      OVeralll LVEF is appears to be approximately 50 to 55% with  hypokinesis of the inferolateal/lateral wall (base, mid).   2. Right ventricular systolic function is low normal. The right  ventricular size is normal.  - Per Dr. Tenny Craw, "Echo (lmited) shows overall LVEF low normal to a little down   ONe wall doesn't appear to be moving as ell  But it was a very difficult study given acoustic windows I would reocmm  CT coronary angiogram to evaluate for CAD".   Echo 11/28/21: IMPRESSIONS   1. Difficult acoustic windows. Lateral wall appears hypokinetic. COnsider  limited echo with Defiinity to confirm wall motion. . Left ventricular  ejection fraction, by estimation, is 50 to 55%. The left ventricle has low  normal function. The left  ventricular internal cavity size was mildly dilated. Left ventricular  diastolic parameters were normal.   2. Right ventricular systolic function is normal. The right ventricular  size is normal.   3. The mitral valve is normal in structure. Trivial mitral valve  regurgitation.   4. The aortic valve is normal in structure. Aortic valve regurgitation is  not visualized.  - Dr. Tenny Craw recommended limited echo with Definity to confirm regional wall motion.    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   Dyspnea    Fatty liver    Fibromyalgia    Headache    Hypertension    Hypothyroidism    Kidney disease, chronic, stage III (moderate, EGFR 30-59 ml/min)    Movement disorder    Pre-diabetes    PTSD (post-traumatic stress disorder)    Restless leg syndrome     Sciatica of right side    Spondylosis    Cervical and lumbar   Substance abuse    Hx Crack/cocaine   Tortuous colon     Past Surgical History:  Procedure Laterality Date   BIOPSY  04/29/2022   Procedure: BIOPSY;  Surgeon: Dolores Frame, MD;  Location: [facility];  Service: Gastroenterology;;   Emiliano Dyer TUNNEL RELEASE Bilateral    COLONOSCOPY WITH PROPOFOL N/A 04/29/2022   Procedure: COLONOSCOPY WITH PROPOFOL;  Surgeon: Dolores Frame, MD;  Location: [facility];  Service: Gastroenterology;  Laterality: N/A;  145   ECTOPIC PREGNANCY SURGERY     ESOPHAGOGASTRODUODENOSCOPY (EGD) WITH PROPOFOL N/A 04/29/2022   Procedure: ESOPHAGOGASTRODUODENOSCOPY (EGD) WITH PROPOFOL;  Surgeon: Dolores Frame,  MD;  Location: [facility];  Service: Gastroenterology;  Laterality: N/A;   SHOULDER SURGERY Right    SHOULDER SURGERY Right    TUBAL LIGATION      MEDICATIONS:  acetaminophen (TYLENOL) 650 MG CR tablet   Apple Cider Vinegar 500 MG TABS   ARIPiprazole (ABILIFY) 2 MG tablet   Ascorbic Acid (VITAMIN C) 500 MG CHEW   Calcium Carbonate-Vitamin D (CALTRATE 600+D PO)   cetirizine (ZYRTEC) 10 MG tablet   Cholecalciferol (VITAMIN D-3 PO)   DULoxetine (CYMBALTA) 30 MG capsule   Erenumab-aooe (AIMOVIG, 140 MG DOSE, )   Fezolinetant (VEOZAH) 45 MG TABS   hydrOXYzine (ATARAX) 25 MG tablet   losartan (COZAAR) 50 MG tablet   Multiple Vitamins-Minerals (CENTRUM SILVER PO)   omeprazole (PRILOSEC) 40 MG capsule   tiZANidine (ZANAFLEX) 2 MG tablet   No current facility-administered medications for this encounter.    Shonna Chock, PA-C Surgical Short Stay/Anesthesiology [facility] Phone [phone] [facility] Phone [phone] 12/10/2022 6:19 PM

## 2022-12-11 ENCOUNTER — Other Ambulatory Visit (HOSPITAL_COMMUNITY): Payer: Self-pay | Admitting: Neurosurgery

## 2022-12-11 ENCOUNTER — Ambulatory Visit: Payer: Medicaid Other | Admitting: Orthopedic Surgery

## 2022-12-11 DIAGNOSIS — M47812 Spondylosis without myelopathy or radiculopathy, cervical region: Secondary | ICD-10-CM

## 2022-12-11 NOTE — Telephone Encounter (Signed)
Primary Cardiologist:Paula Tenny Craw, MD  Chart reviewed as part of pre-operative protocol coverage. Because of Briana Wyatt past medical history and time since last visit, he/she will require a follow-up visit in order to better assess preoperative cardiovascular risk.  Pre-op covering staff: - Please schedule appointment and call patient to inform them. - Please contact requesting surgeon's office via preferred method (i.e, phone, fax) to inform them of need for appointment prior to surgery.  No cardiac medications to hold.   Briana Aland, NP-C  12/11/2022, 7:25 AM [address] Office [phone] Fax [phone]

## 2022-12-11 NOTE — Telephone Encounter (Signed)
I left message for the pt to call the office today ASAP. Pt is going to need an appt for pre op clearance. Per Eligha Bridegroom, NP per pre op protocol the pt needs IN OFFICE appt. I d/w Edd Fabian, FNP to see if he would be ok to see the pt today at 2:45. Per FNP he said that is fine. I did tell him that I left message for the pt to call back ASAP to schedule the appt for today with Edd Fabian, FNP. Pe FNP I have held the time slot for the pt.   I will send FYI to requesting office the pt is going to need an appt and I left message for her to back as we can see her today at 2:45 at our [employer] location at [facility], same building as [employer].

## 2022-12-11 NOTE — Telephone Encounter (Signed)
Patient returned phone call. Patient is unable to make it to the appt today. I have the patient scheduled for Monday at our [place] office with Gillian Shields for her Pre-Op Clearance.

## 2022-12-11 NOTE — Telephone Encounter (Signed)
Pt has called back and has been scheduled to see Gillian Shields, NP 12/14/22 9:15 at the [facility] location. Pt states she could not come in today. I will update all parties involved.

## 2022-12-14 ENCOUNTER — Encounter (HOSPITAL_BASED_OUTPATIENT_CLINIC_OR_DEPARTMENT_OTHER): Payer: Self-pay | Admitting: Family

## 2022-12-14 ENCOUNTER — Ambulatory Visit (INDEPENDENT_AMBULATORY_CARE_PROVIDER_SITE_OTHER): Payer: Medicaid Other | Admitting: Family

## 2022-12-14 ENCOUNTER — Telehealth: Payer: Self-pay | Admitting: *Deleted

## 2022-12-14 VITALS — BP 127/78 | HR 65 | Ht 65.0 in | Wt 207.0 lb

## 2022-12-14 DIAGNOSIS — R0683 Snoring: Secondary | ICD-10-CM | POA: Diagnosis not present

## 2022-12-14 DIAGNOSIS — Z01810 Encounter for preprocedural cardiovascular examination: Secondary | ICD-10-CM

## 2022-12-14 DIAGNOSIS — I10 Essential (primary) hypertension: Secondary | ICD-10-CM | POA: Diagnosis not present

## 2022-12-14 NOTE — Telephone Encounter (Signed)
Ok to activate itamar device. Briana Wyatt will be notified to contact the patient for activation of the device.

## 2022-12-14 NOTE — Anesthesia Preprocedure Evaluation (Addendum)
Anesthesia Evaluation  Patient identified by MRN, date of birth, ID band Patient awake    Reviewed: Allergy & Precautions, NPO status , Patient's Chart, lab work & pertinent test results  Airway Mallampati: II  TM Distance: >3 FB Neck ROM: Full    Dental  (+) Dental Advisory Given, Poor Dentition, Missing, Edentulous Lower   Pulmonary shortness of breath, sleep apnea , COPD,  COPD inhaler, former smoker   Pulmonary exam normal breath sounds clear to auscultation       Cardiovascular hypertension, Pt. on medications (-) angina (-) CAD and (-) Past MI Normal cardiovascular exam Rhythm:Regular Rate:Normal     Neuro/Psych  Headaches PSYCHIATRIC DISORDERS Anxiety Depression     Neuromuscular disease    GI/Hepatic ,GERD  Medicated,,(+)     substance abuse  cocaine use  Endo/Other  Hypothyroidism  Obesity   Renal/GU Renal InsufficiencyRenal disease     Musculoskeletal  (+) Arthritis ,  Fibromyalgia -  Abdominal   Peds  Hematology negative hematology ROS (+)   Anesthesia Other Findings   Reproductive/Obstetrics                             Anesthesia Physical Anesthesia Plan  ASA: 3  Anesthesia Plan: MAC   Post-op Pain Management: Tylenol PO (pre-op)*   Induction: Intravenous  PONV Risk Score and Plan: 2 and TIVA, Midazolam, Dexamethasone and Ondansetron  Airway Management Planned: Natural Airway and Simple Face Mask  Additional Equipment:   Intra-op Plan:   Post-operative Plan:   Informed Consent: I have reviewed the patients History and Physical, chart, labs and discussed the procedure including the risks, benefits and alternatives for the proposed anesthesia with the patient or authorized representative who has indicated his/her understanding and acceptance.     Dental advisory given  Plan Discussed with: CRNA  Anesthesia Plan Comments: (PAT note written by Shonna Chock,  PA-C.  )       Anesthesia Quick Evaluation

## 2022-12-14 NOTE — Progress Notes (Signed)
Office Visit    Patient Name: Briana Wyatt Date of Encounter: 12/14/2022  PCP:  Wilmon Pali, FNP   [facility]  Cardiologist:  Dietrich Pates, MD  Advanced Practice Provider:  No care team member to display Electrophysiologist:  None    Chief Complaint    Briana Wyatt is a 52 y.o. 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   Dyspnea    Fatty liver    Fibromyalgia    Headache    Hypertension    Hypothyroidism    Kidney disease, chronic, stage III (moderate, EGFR 30-59 ml/min)    Movement disorder    Pre-diabetes    PTSD (post-traumatic stress disorder)    Restless leg syndrome    Sciatica of right side    Spondylosis    Cervical and lumbar   Substance abuse    Hx Crack/cocaine   Tortuous colon    Past Surgical History:  Procedure Laterality Date   BIOPSY  04/29/2022   Procedure: BIOPSY;  Surgeon: Dolores Frame, MD;  [facility];  Service: Gastroenterology;;   Fidela Salisbury RELEASE Bilateral    COLONOSCOPY WITH PROPOFOL N/A 04/29/2022   Procedure: COLONOSCOPY WITH PROPOFOL;  Surgeon: Dolores Frame, MD;  [facility];  Service: Gastroenterology;  Laterality: N/A;  145   ECTOPIC PREGNANCY SURGERY     ESOPHAGOGASTRODUODENOSCOPY (EGD) WITH PROPOFOL N/A 04/29/2022   Procedure: ESOPHAGOGASTRODUODENOSCOPY (EGD) WITH PROPOFOL;  Surgeon: Dolores Frame, MD;  [facility];  Service: Gastroenterology;  Laterality: N/A;   SHOULDER SURGERY Right    SHOULDER SURGERY Right    TUBAL LIGATION      Allergies  Allergies  Allergen Reactions   Codeine Nausea And Vomiting    High doses of codeine pt reports greater  than 30mg  she can't tolerate    Methocarbamol Itching    "feels like bugs are crawling under skin"   Nsaids     Other reaction(s): Contraindicated CKD, only one kidney Other reaction(s): Contraindicated CKD, only one kidney    History of Present Illness    Briana Wyatt is a 52 y.o. female with a hx of depression, chronic pain, Barrett's esophagus, prior tobacco and cocaine use, known 1 kidney last seen 11/12/2021 by Dr. Tenny Craw.  Seen 11/12/2021 for left-sided chest pain and exertional dyspnea.  Echo 01/08/2022 LVEF 50 to 55%, hypokinesis noted.  Subsequent cardiac CTA 02/02/22 coronary calcium score of 0.  Inadvertent finding of fatty liver changes with hepatic steatosis recommended to reduce sugars and follow low carbohydrate diet.  Presents today for preoperative clearance. Notes she has numbness in her hand and wrist which has been ongoing for years. Is pending ulnar nerve release at elbow later this week.  Walking a couple times per week 0.5 mile to the park and back without chest pain. Mild exertional dyspnea which is stable at her baseline and she attributes to inactivity. Does note she may need future hip surgery  Did have one episode of chest pain yesterday while standing cooking. She had taken a bite of small potato which was hot. Describes pain as "sharp" and "fiery" lasted a minute. It self  resolved. Reassurance provided likely esophageal spasm.   No edema, orthopnea. Reports occasional episodes of PND as well as snoring. Reports no palpitations.     EKGs/Labs/Other Studies Reviewed:   The following studies were reviewed today: Cardiac Studies & Procedures       ECHOCARDIOGRAM  ECHOCARDIOGRAM LIMITED 01/09/2022  Narrative ECHOCARDIOGRAM LIMITED REPORT    Patient Name:   Briana Wyatt Date of Exam: 01/08/2022 Medical Rec #:  [id number]         Height:       66.0 in Accession #:    [id number]        Weight:       197.0 lb Date of Birth:  [birth year]         BSA:           1.987 m Patient Age:    51 years          BP:           136/93 mmHg Patient Gender: F                 HR:           69 bpm. [facility]  Procedure: Limited Echo  Indications:    R93.1 (ICD-10-CM) - Regional wall motion abnormality of heart  History:        Patient has prior history of Echocardiogram examinations, most recent 11/28/2021. Risk Factors:Hypertension. Acid reflux (From Hx).  Sonographer:    Celesta Gentile RCS Referring Phys: [id number] PAULA V ROSS  IMPRESSIONS   1. Limited images with Definity Acoustic windows difficult OVeralll LVEF is appears to be approximately 50 to 55% with hypokinesis of the inferolateal/lateral wall (base, mid). 2. Right ventricular systolic function is low normal. The right ventricular size is normal.  FINDINGS Left Ventricle: Limited images with Definity Acoustic windows difficult OVeralll LVEF is appears to be approximately 50 to 55% with hypokinesis of the inferolateal/lateral wall (base, mid). Definity contrast agent was given IV to delineate the left ventricular endocardial borders.  Right Ventricle: The right ventricular size is normal. Right vetricular wall thickness was not assessed. Right ventricular systolic function is low normal.  Left Atrium: Left atrial size was normal in size.  Right Atrium: Right atrial size was normal in size.  Pericardium: Trivial pericardial effusion is present.  IAS/Shunts: No atrial level shunt detected by color flow Doppler.  Dietrich Pates MD Electronically signed by Dietrich Pates MD Signature Date/Time: 01/09/2022/1:05:30 PM    Final     CT SCANS  CT CORONARY MORPH W/CTA COR W/SCORE 02/02/2022  Addendum 02/02/2022  8:04 PM ADDENDUM REPORT: 02/02/2022 20:02  CLINICAL DATA:  This is a 52 year old female with anginal symptoms.  EXAM: Cardiac/Coronary  CTA  TECHNIQUE: The patient was scanned on a [employer].  FINDINGS: A 100 kV prospective scan was triggered in the  descending thoracic aorta at 111 HU's. Axial non-contrast 3 mm slices were carried out through the heart. The data set was analyzed on a dedicated work station and scored using the Agatson method. Gantry rotation speed was 250 msecs and collimation was .6 mm. No beta blockade and 0.8 mg of sl NTG was given. The 3D data set was reconstructed in 5% intervals of the 67-82 % of the R-R cycle. Diastolic phases were analyzed on a dedicated work station using MPR, MIP and VRT modes. The patient received 80 cc of contrast.  Study Quality: Fair with misregistration artifact.  Aorta: Normal size.  No calcifications.  No dissection.  Aortic Valve:  Trileaflet.  No calcifications.  Coronary Arteries:  Normal coronary origin.  Right dominance.  RCA is a large dominant artery that gives rise to PDA and PLA. There is no plaque.  Left main is a large artery that gives rise to LAD and LCX arteries.  LAD is a large vessel that has no plaque.  LCX is a non-dominant artery that gives rise to one large OM1 branch. There is no plaque.  Coronary Calcium Score:  Left main: 0  Left anterior descending artery: 0  Left circumflex artery: 0  Right coronary artery: 0  Total: 0  Percentile: 0  Other findings:  Normal pulmonary vein drainage into the left atrium.  Normal left atrial appendage without a thrombus.  Normal size of the pulmonary artery.  IMPRESSION: 1. Coronary calcium score of 0. This was 0 percentile for age and sex matched control.  2. Normal coronary origin with right dominance.  3. No evidence of CAD. CAD-RADS 0. No evidence of CAD (0%). Consider non-atherosclerotic causes of chest pain.  The noncardiac portion of this study will be interpreted in separate report by the radiologist.   Electronically Signed By: Thomasene Ripple D.O. On: 02/02/2022 20:02  Narrative EXAM: OVER-READ INTERPRETATION  CT CHEST  The following report is a limited chest CT over-read  performed by radiologist Dr. Donzetta Kohut of [facility] on 02/02/2022. The coronary calcium and coronary CT angiography interpretation by the cardiologist is attached.  COMPARISON:  None Available.  FINDINGS: Vascular: Please see dedicated report for cardiovascular details.  Mediastinum/Nodes: Visualized portions of the mediastinum without signs of acute process or evidence of adenopathy.  Lungs/Pleura: Mild basilar atelectasis. Visualized lungs are otherwise unremarkable, visualized airways are patent.  Upper Abdomen: Hepatic steatosis difficult to quantify on the current study.  Musculoskeletal: No acute bone finding. No destructive bone process. Spinal degenerative changes. Degenerative changes are moderate.  IMPRESSION: 1. Mild basilar atelectasis. 2. Hepatic steatosis difficult to quantify on the current study. Correlate with any clinical or laboratory evidence of hepatic dysfunction or with signs of metabolic syndrome.  Electronically Signed: By: Donzetta Kohut M.D. On: 02/02/2022 15:51           EKG:  EKG is  ordered today.  The ekg ordered today demonstrates NSR 65 bpm with no acute ST/T wave changes.   Recent Labs: 03/31/2022: B Natriuretic Peptide 7.0 08/10/2022: TSH 1.550 12/10/2022: ALT 31; BUN 23; Creatinine, Ser 1.01; Hemoglobin 13.1; Platelets 229; Potassium 3.8; Sodium 141  Recent Lipid Panel    Component Value Date/Time   CHOL 217 (H) 08/10/2022 1039   TRIG 498 (H) 08/10/2022 1039   HDL 43 08/10/2022 1039   CHOLHDL 5.0 (H) 08/10/2022 1039   CHOLHDL 5.7 03/13/2022 0636   VLDL 73 (H) 03/13/2022 0636   LDLCALC 92 08/10/2022 1039    Home Medications   Current Meds  Medication Sig   acetaminophen (TYLENOL) 650 MG CR tablet Take 650 mg by mouth every 8 (eight) hours as needed for pain or fever.   albuterol (VENTOLIN HFA) 108 (90 Base) MCG/ACT inhaler Inhale 2 puffs into the lungs daily as needed for wheezing or shortness of breath (COPD).    ARIPiprazole (ABILIFY) 5 MG tablet Take 5 mg by mouth daily.   cetirizine (ZYRTEC) 10 MG tablet Take 10 mg by mouth daily.   Erenumab-aooe (AIMOVIG, 140 MG DOSE, [place]) Inject 140 mg into the skin every 30 (thirty) days. One a month  Fezolinetant (VEOZAH) 45 MG TABS Take 1 tablet (45 mg total) by mouth at bedtime.   hydrOXYzine (ATARAX) 50 MG tablet Take 50 mg by mouth daily. Additional 50 mg if needed for anxiety   losartan (COZAAR) 50 MG tablet TAKE 1 Tablet BY MOUTH ONCE EVERY DAY   omeprazole (PRILOSEC) 40 MG capsule TAKE 1 Capsule BY MOUTH ONCE EVERY DAY   pregabalin (LYRICA) 150 MG capsule Take 150 mg by mouth 2 (two) times daily.   tiZANidine (ZANAFLEX) 2 MG tablet Take 2 mg by mouth daily.   traZODone (DESYREL) 50 MG tablet Take 50 mg by mouth at bedtime.     Review of Systems      All other systems reviewed and are otherwise negative except as noted above.  Physical Exam    VS:  BP 127/78   Pulse 65   Ht  (1.651 m)   Wt 207 lb (93.9 kg)   LMP 08/25/2019   BMI 34.45 kg/m  , BMI Body mass index is 34.45 kg/m.  Wt Readings from Last 3 Encounters:  12/14/22 207 lb (93.9 kg)  12/10/22 204 lb 8 oz (92.8 kg)  08/18/22 209 lb (94.8 kg)     GEN: Well nourished, overweight, well developed, in no acute distress. HEENT: normal. Neck: Supple, no JVD, carotid bruits, or masses. Cardiac: RRR, no murmurs, rubs, or gallops. No clubbing, cyanosis, edema.  Radials/PT 2+ and equal bilaterally.  Respiratory:  Respirations regular and unlabored, clear to auscultation bilaterally. GI: Soft, nontender, nondistended. MS: No deformity or atrophy. Skin: Warm and dry, no rash. Neuro:  Strength and sensation are intact. Psych: Normal affect.  Assessment & Plan     Preoperative clearance - Per AHA/ACC guidelines, she is deemed acceptable risk for the planned procedure without additional cardiovascular testing. Will route to surgical team so they are aware.    Chest pain - Cardiac  CTA 01/2022 coronary calcium score of 0. One episode of chest pain described as "fire" after eating a bite of hot food.  Exercising by walking 0.5 miles 3 times per week without chest pain.  Likely etiology esophageal spasm.  EKG today no acute ST/T wave changes.  Reassurance provided.  No indication for ischemic evaluation.  HTN - BP well controlled. Continue current antihypertensive regimen.  Continue to follow with PCP.   Snores - STOPBang 5. Itamar home sleep study provided.         Disposition: Follow up in 3 month(s) with Dietrich Pates, MD or APP.  Signed, Alver Sorrow, NP 12/14/2022, 9:23 AM [facility]

## 2022-12-14 NOTE — Patient Instructions (Signed)
Medication Instructions:  Your physician recommends that you continue on your current medications as directed. Please refer to the Current Medication list given to you today.  Testing/Procedures: WatchPAT?  Is a FDA cleared portable home sleep study test that uses a watch and 3 points of contact to monitor 7 different channels, including your heart rate, oxygen saturations, body position, snoring, and chest motion.  The study is easy to use from the comfort of your own home and accurately detect sleep apnea.  Before bed, you attach the chest sensor, attached the sleep apnea bracelet to your nondominant hand, and attach the finger probe.  After the study, the raw data is downloaded from the watch and scored for apnea events.   For more information: [email or web address]  Patient Testing Instructions:  Do not put battery into the device until bedtime when you are ready to begin the test. Please call the support number if you need assistance after following the instructions below: 24 hour support line- [phone] or ITAMAR support at [phone] (option 2)  Download the The [employer] AmericanWatchPAT One" app through the google play store or App Store  Be sure to turn on or enable access to bluetooth in settlings on your smartphone/ device  Make sure no other bluetooth devices are on and within the vicinity of your smartphone/ device and WatchPAT watch during testing.  Make sure to leave your smart phone/ device plugged in and charging all night.  When ready for bed:  Follow the instructions step by step in the WatchPAT One App to activate the testing device. For additional instructions, including video instruction, visit the WatchPAT One video on Youtube. You can search for [place] One within Youtube (video is 4 minutes and 18 seconds) or enter: https://youtube/watch?v=BCce_vbiwxE Please note: You will be prompted to enter a Pin to connect via bluetooth when starting the test. The PIN will be  assigned to you when you receive the test.  The device is disposable, but it recommended that you retain the device until you receive a call letting you know the study has been received and the results have been interpreted.  We will let you know if the study did not transmit to [place] properly after the test is completed. You do not need to call us to confirm the receipt of the test.  Please complete the test within 48 hours of receiving PIN.   Frequently Asked Questions:  What is Watch [place] one?  A single use fully disposable home sleep apnea testing device and will not need to be returned after completion.  What are the requirements to use WatchPAT one?  The be able to have a successful watchpat one sleep study, you should have your Watch pat one device, your smart phone, watch pat one app, your PIN number and Internet access What type of phone do I need?  You should have a smart phone that uses Android 5.1 and above or any Iphone with IOS 10 and above How can I download the WatchPAT one app?  Based on your device type search for WatchPAT one app either in google play for android devices or APP store for Iphone's Where will I get my PIN for the study?  Your PIN will be provided by your physician's office. It is used for authentication and if you lose/forget your PIN, please reach out to your providers office.  I do not have Internet at home. Can I do WatchPAT one study?  WatchPAT One needs Internet connection throughout the night to  be able to transmit the sleep data. You can use your home/local internet or your cellular's data package. However, it is always recommended to use home/local Internet. It is estimated that between 20MB-30MB will be used with each study.However, the application will be looking for space in the phone to start the study.  What happens if I lose internet or bluetooth connection?  During the internet disconnection, your phone will not be able to transmit the sleep data.  All the data, will be stored in your phone. As soon as the internet connection is back on, the phone will being sending the sleep data. During the bluetooth disconnection, WatchPAT one will not be able to to send the sleep data to your phone. Data will be kept in the [facility] one until two devices have bluetooth connection back on. As soon as the connection is back on, WatchPAT one will send the sleep data to the phone.  How long do I need to wear the WatchPAT one?  After you start the study, you should wear the device at least 6 hours.  How far should I keep my phone from the device?  During the night, your phone should be within 15 feet.  What happens if I leave the room for restroom or other reasons?  Leaving the room for any reason will not cause any problem. As soon as your get back to the room, both devices will reconnect and will continue to send the sleep data. Can I use my phone during the sleep study?  Yes, you can use your phone as usual during the study. But it is recommended to put your watchpat one on when you are ready to go to bed.  How will I get my study results?  A soon as you completed your study, your sleep data will be sent to the provider. They will then share the results with you when they are ready.  Follow-Up: At [facility], you and your health needs are our priority.  As part of our continuing mission to provide you with exceptional heart care, we have created designated Provider Care Teams.  These Care Teams include your primary Cardiologist (physician) and Advanced Practice Providers (APPs -  Physician Assistants and Nurse Practitioners) who all work together to provide you with the care you need, when you need it.  We recommend signing up for the patient portal called "MyChart".  Sign up information is provided on this After Visit Summary.  MyChart is used to connect with patients for Virtual Visits (Telemedicine).  Patients are able to view lab/test results,  encounter notes, upcoming appointments, etc.  Non-urgent messages can be sent to your provider as well.   To learn more about what you can do with MyChart, go to [email or web address].    Your next appointment:   3 month(s)  Provider:   Dr. Tenny Craw or Gillian Shields, NP

## 2022-12-15 ENCOUNTER — Ambulatory Visit: Payer: Medicaid Other | Admitting: Orthopedic Surgery

## 2022-12-16 ENCOUNTER — Ambulatory Visit (HOSPITAL_BASED_OUTPATIENT_CLINIC_OR_DEPARTMENT_OTHER): Payer: Medicaid Other | Admitting: Anesthesiology

## 2022-12-16 ENCOUNTER — Encounter (HOSPITAL_COMMUNITY)
Admission: RE | Disposition: A | Discharge status: Home / Self Care | Payer: Self-pay | Source: Home / Self Care | Attending: Neurosurgery

## 2022-12-16 ENCOUNTER — Ambulatory Visit (HOSPITAL_COMMUNITY)
Admission: RE | Admit: 2022-12-16 | Discharge: 2022-12-16 | Disposition: A | Discharge status: Home / Self Care | Payer: Medicaid Other | Attending: Neurosurgery | Admitting: Neurosurgery

## 2022-12-16 ENCOUNTER — Other Ambulatory Visit: Payer: Self-pay

## 2022-12-16 ENCOUNTER — Encounter (HOSPITAL_COMMUNITY): Payer: Self-pay | Admitting: Neurosurgery

## 2022-12-16 ENCOUNTER — Ambulatory Visit (HOSPITAL_COMMUNITY): Payer: Medicaid Other | Admitting: Vascular Surgery

## 2022-12-16 DIAGNOSIS — E039 Hypothyroidism, unspecified: Secondary | ICD-10-CM | POA: Diagnosis not present

## 2022-12-16 DIAGNOSIS — M797 Fibromyalgia: Secondary | ICD-10-CM | POA: Diagnosis not present

## 2022-12-16 DIAGNOSIS — I129 Hypertensive chronic kidney disease with stage 1 through stage 4 chronic kidney disease, or unspecified chronic kidney disease: Secondary | ICD-10-CM | POA: Diagnosis not present

## 2022-12-16 DIAGNOSIS — I10 Essential (primary) hypertension: Secondary | ICD-10-CM

## 2022-12-16 DIAGNOSIS — J449 Chronic obstructive pulmonary disease, unspecified: Secondary | ICD-10-CM | POA: Insufficient documentation

## 2022-12-16 DIAGNOSIS — Z09 Encounter for follow-up examination after completed treatment for conditions other than malignant neoplasm: Secondary | ICD-10-CM | POA: Diagnosis not present

## 2022-12-16 DIAGNOSIS — F418 Other specified anxiety disorders: Secondary | ICD-10-CM | POA: Insufficient documentation

## 2022-12-16 DIAGNOSIS — G5621 Lesion of ulnar nerve, right upper limb: Secondary | ICD-10-CM

## 2022-12-16 DIAGNOSIS — Z87891 Personal history of nicotine dependence: Secondary | ICD-10-CM

## 2022-12-16 DIAGNOSIS — N183 Chronic kidney disease, stage 3 unspecified: Secondary | ICD-10-CM | POA: Insufficient documentation

## 2022-12-16 DIAGNOSIS — K219 Gastro-esophageal reflux disease without esophagitis: Secondary | ICD-10-CM | POA: Diagnosis not present

## 2022-12-16 HISTORY — PX: ULNAR NERVE TRANSPOSITION: SHX2595

## 2022-12-16 SURGERY — ULNAR NERVE DECOMPRESSION/TRANSPOSITION
Anesthesia: Monitor Anesthesia Care | Laterality: Right

## 2022-12-16 MED ORDER — MIDAZOLAM HCL 2 MG/2ML IJ SOLN
INTRAMUSCULAR | Status: AC
  Filled 2022-12-16: qty 2

## 2022-12-16 MED ORDER — PHENYLEPHRINE 80 MCG/ML (10ML) SYRINGE FOR IV PUSH (FOR BLOOD PRESSURE SUPPORT)
PREFILLED_SYRINGE | INTRAVENOUS | Status: DC | PRN
  Administered 2022-12-16: 80 ug via INTRAVENOUS

## 2022-12-16 MED ORDER — BACITRACIN ZINC 500 UNIT/GM EX OINT
TOPICAL_OINTMENT | CUTANEOUS | Status: AC
  Filled 2022-12-16: qty 28.35

## 2022-12-16 MED ORDER — CHLORHEXIDINE GLUCONATE 0.12 % MT SOLN: 15.0000 mL | Freq: Once | OROMUCOSAL | Status: AC

## 2022-12-16 MED ORDER — FENTANYL CITRATE (PF) 250 MCG/5ML IJ SOLN
INTRAMUSCULAR | Status: DC | PRN
  Administered 2022-12-16: 50 ug via INTRAVENOUS

## 2022-12-16 MED ORDER — ACETAMINOPHEN 500 MG PO TABS: 1000.0000 mg | ORAL_TABLET | Freq: Once | ORAL | Status: AC

## 2022-12-16 MED ORDER — CHLORHEXIDINE GLUCONATE CLOTH 2 % EX PADS: 6.0000 | MEDICATED_PAD | Freq: Once | CUTANEOUS | Status: DC

## 2022-12-16 MED ORDER — BUPIVACAINE HCL (PF) 0.5 % IJ SOLN
INTRAMUSCULAR | Status: DC | PRN
  Administered 2022-12-16: 4 mL

## 2022-12-16 MED ORDER — HYDROCODONE-ACETAMINOPHEN 5-325 MG PO TABS: 1.0000 | ORAL_TABLET | Freq: Four times a day (QID) | ORAL | 0 refills | Status: AC | PRN

## 2022-12-16 MED ORDER — BUPIVACAINE HCL (PF) 0.5 % IJ SOLN
INTRAMUSCULAR | Status: AC
  Filled 2022-12-16: qty 30

## 2022-12-16 MED ORDER — CEFAZOLIN SODIUM-DEXTROSE 2-4 GM/100ML-% IV SOLN
2.0000 g | INTRAVENOUS | Status: AC
  Administered 2022-12-16: 2 g via INTRAVENOUS

## 2022-12-16 MED ORDER — ORAL CARE MOUTH RINSE: 15.0000 mL | Freq: Once | OROMUCOSAL | Status: AC

## 2022-12-16 MED ORDER — ONDANSETRON HCL 4 MG/2ML IJ SOLN: 4.0000 mg | Freq: Once | INTRAMUSCULAR | Status: DC | PRN

## 2022-12-16 MED ORDER — 0.9 % SODIUM CHLORIDE (POUR BTL) OPTIME
TOPICAL | Status: DC | PRN
  Administered 2022-12-16: 1000 mL

## 2022-12-16 MED ORDER — ACETAMINOPHEN 500 MG PO TABS
ORAL_TABLET | ORAL | Status: AC
  Administered 2022-12-16: 1000 mg via ORAL
  Filled 2022-12-16: qty 2

## 2022-12-16 MED ORDER — PROPOFOL 10 MG/ML IV BOLUS
INTRAVENOUS | Status: DC | PRN
  Administered 2022-12-16: 50 mg via INTRAVENOUS

## 2022-12-16 MED ORDER — FENTANYL CITRATE (PF) 250 MCG/5ML IJ SOLN
INTRAMUSCULAR | Status: AC
  Filled 2022-12-16: qty 5

## 2022-12-16 MED ORDER — MIDAZOLAM HCL 2 MG/2ML IJ SOLN
INTRAMUSCULAR | Status: DC | PRN
  Administered 2022-12-16: 2 mg via INTRAVENOUS

## 2022-12-16 MED ORDER — LACTATED RINGERS IV SOLN: INTRAVENOUS | Status: DC

## 2022-12-16 MED ORDER — CHLORHEXIDINE GLUCONATE 0.12 % MT SOLN
OROMUCOSAL | Status: AC
  Administered 2022-12-16: 15 mL via OROMUCOSAL
  Filled 2022-12-16: qty 15

## 2022-12-16 MED ORDER — LIDOCAINE-EPINEPHRINE 1 %-1:100000 IJ SOLN
INTRAMUSCULAR | Status: DC | PRN
  Administered 2022-12-16: 4 mL

## 2022-12-16 MED ORDER — LIDOCAINE-EPINEPHRINE 1 %-1:100000 IJ SOLN
INTRAMUSCULAR | Status: AC
  Filled 2022-12-16: qty 1

## 2022-12-16 MED ORDER — PROPOFOL 500 MG/50ML IV EMUL
INTRAVENOUS | Status: DC | PRN
  Administered 2022-12-16: 100 ug/kg/min via INTRAVENOUS

## 2022-12-16 MED ORDER — FENTANYL CITRATE (PF) 100 MCG/2ML IJ SOLN: 25.0000 ug | INTRAMUSCULAR | Status: DC | PRN

## 2022-12-16 MED ORDER — BACITRACIN ZINC 500 UNIT/GM EX OINT
TOPICAL_OINTMENT | CUTANEOUS | Status: DC | PRN
  Administered 2022-12-16: 1 via TOPICAL

## 2022-12-16 SURGICAL SUPPLY — 45 items
BAG COUNTER SPONGE SURGICOUNT (BAG) ×1 IMPLANT
BAG SPNG CNTER NS LX DISP (BAG) ×1
BAND INSRT 18 STRL LF DISP RB (MISCELLANEOUS)
BAND RUBBER #18 3X1/16 STRL (MISCELLANEOUS) IMPLANT
BNDG CMPR 5X3 KNIT ELC UNQ LF (GAUZE/BANDAGES/DRESSINGS) ×1
BNDG CMPR STD VLCR NS LF 5.8X3 (GAUZE/BANDAGES/DRESSINGS) ×1
BNDG ELASTIC 3INX 5YD STR LF (GAUZE/BANDAGES/DRESSINGS) ×1 IMPLANT
BNDG ELASTIC 3X5.8 VLCR NS LF (GAUZE/BANDAGES/DRESSINGS) IMPLANT
BNDG GAUZE DERMACEA FLUFF 4 (GAUZE/BANDAGES/DRESSINGS) ×1 IMPLANT
BNDG GZE DERMACEA 4 6PLY (GAUZE/BANDAGES/DRESSINGS) ×1
DRAPE EXTREMITY T 121X128X90 (DISPOSABLE) ×1 IMPLANT
DRAPE HALF SHEET 40X57 (DRAPES) ×1 IMPLANT
DRAPE MICROSCOPE SLANT 54X150 (MISCELLANEOUS) IMPLANT
DRSG XEROFORM 1X8 (GAUZE/BANDAGES/DRESSINGS) IMPLANT
DURAPREP 26ML APPLICATOR (WOUND CARE) ×1 IMPLANT
GAUZE 4X4 16PLY ~~LOC~~+RFID DBL (SPONGE) ×1 IMPLANT
GAUZE SPONGE 4X4 12PLY STRL (GAUZE/BANDAGES/DRESSINGS) ×1 IMPLANT
GAUZE XEROFORM 1X8 LF (GAUZE/BANDAGES/DRESSINGS) ×1 IMPLANT
GLOVE BIOGEL PI IND STRL 7.5 (GLOVE) ×1 IMPLANT
GLOVE ECLIPSE 7.0 STRL STRAW (GLOVE) ×1 IMPLANT
GLOVE EXAM NITRILE XL STR (GLOVE) IMPLANT
GOWN STRL REUS W/ TWL LRG LVL3 (GOWN DISPOSABLE) ×2 IMPLANT
GOWN STRL REUS W/ TWL XL LVL3 (GOWN DISPOSABLE) IMPLANT
GOWN STRL REUS W/TWL 2XL LVL3 (GOWN DISPOSABLE) IMPLANT
GOWN STRL REUS W/TWL LRG LVL3 (GOWN DISPOSABLE) ×2
GOWN STRL REUS W/TWL XL LVL3 (GOWN DISPOSABLE)
KIT BASIN OR (CUSTOM PROCEDURE TRAY) ×1 IMPLANT
KIT TURNOVER KIT B (KITS) ×1 IMPLANT
NDL HYPO 25X1 1.5 SAFETY (NEEDLE) ×1 IMPLANT
NEEDLE HYPO 25X1 1.5 SAFETY (NEEDLE) ×1 IMPLANT
NS IRRIG 1000ML POUR BTL (IV SOLUTION) ×1 IMPLANT
PACK LAMINECTOMY NEURO (CUSTOM PROCEDURE TRAY) ×1 IMPLANT
PAD ARMBOARD 7.5X6 YLW CONV (MISCELLANEOUS) ×3 IMPLANT
SOL ELECTROSURG ANTI STICK (MISCELLANEOUS)
SOLUTION ELECTROSURG ANTI STCK (MISCELLANEOUS) ×1 IMPLANT
SPIKE FLUID TRANSFER (MISCELLANEOUS) ×1 IMPLANT
STOCKINETTE 4X48 STRL (DRAPES) ×1 IMPLANT
SUT ETHILON 3 0 PS 1 (SUTURE) ×1 IMPLANT
SUT VIC AB 2-0 CP2 18 (SUTURE) IMPLANT
SUT VICRYL 3-0 CR8 SH (SUTURE) IMPLANT
SYR BULB EAR ULCER 3OZ GRN STR (SYRINGE) ×1 IMPLANT
TOWEL GREEN STERILE (TOWEL DISPOSABLE) ×1 IMPLANT
TOWEL GREEN STERILE FF (TOWEL DISPOSABLE) ×1 IMPLANT
UNDERPAD 30X36 HEAVY ABSORB (UNDERPADS AND DIAPERS) ×1 IMPLANT
WATER STERILE IRR 1000ML POUR (IV SOLUTION) ×1 IMPLANT

## 2022-12-16 NOTE — H&P (Signed)
Chief Complaint   Right arm pain  History of Present Illness  Briana Wyatt is a 52 y.o. female initially seen in the clinic with multiple different complaints including right arm pain c/w ulnar neuropathy. This was confirmed with EMG/NCS and she presents today for right ulnar nerve release.  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   Dyspnea    Fatty liver    Fibromyalgia    Headache    Hypertension    Hypothyroidism    Kidney disease, chronic, stage III (moderate, EGFR 30-59 ml/min)    Movement disorder    Pre-diabetes    PTSD (post-traumatic stress disorder)    Restless leg syndrome    Sciatica of right side    Spondylosis    Cervical and lumbar   Substance abuse    Hx Crack/cocaine   Tortuous colon     Past Surgical History   Past Surgical History:  Procedure Laterality Date   BIOPSY  04/29/2022   Procedure: BIOPSY;  Surgeon: Dolores Frame, MD;  [facility];  Service: Gastroenterology;;   Emiliano Dyer TUNNEL RELEASE Bilateral    COLONOSCOPY WITH PROPOFOL N/A 04/29/2022   Procedure: COLONOSCOPY WITH PROPOFOL;  Surgeon: Dolores Frame, MD;  [facility];  Service: Gastroenterology;  Laterality: N/A;  145   ECTOPIC PREGNANCY SURGERY     ESOPHAGOGASTRODUODENOSCOPY (EGD) WITH PROPOFOL N/A 04/29/2022   Procedure: ESOPHAGOGASTRODUODENOSCOPY (EGD) WITH PROPOFOL;  Surgeon: Dolores Frame, MD;  [facility];  Service: Gastroenterology;  Laterality: N/A;   SHOULDER SURGERY Right    SHOULDER SURGERY Right    TUBAL LIGATION      Social History   Social History   Tobacco Use   Smoking status: Former    Types: Cigarettes    Quit date: 08/25/1995    Years since quitting: 27.3    Passive  exposure: Past   Smokeless tobacco: Never  Vaping Use   Vaping Use: Never used  Substance Use Topics   Alcohol use: No   Drug use: Yes    Types: "Crack" cocaine    Comment: last used one month ago    Medications   Prior to Admission medications   Medication Sig Start Date End Date Taking? Authorizing Provider  acetaminophen (TYLENOL) 650 MG CR tablet Take 650 mg by mouth every 8 (eight) hours as needed for pain or fever. 03/02/19  Yes [provider]  albuterol (VENTOLIN HFA) 108 (90 Base) MCG/ACT inhaler Inhale 2 puffs into the lungs daily as needed for wheezing or shortness of breath (COPD).   Yes [provider]  ARIPiprazole (ABILIFY) 5 MG tablet Take 5 mg by mouth daily.   Yes [provider]  cetirizine (ZYRTEC) 10 MG tablet Take 10 mg by mouth daily.   Yes [provider]  DULoxetine (CYMBALTA) 30 MG capsule Take 1 capsule (30 mg total) by mouth 2 (two) times daily. 03/16/22 12/16/22 Yes [place], Harrold Donath, MD  Erenumab-aooe (AIMOVIG, 140 MG DOSE, [place]) Inject 140 mg into the skin every 30 (thirty) days. One a month 02/18/22  Yes [provider]  Fezolinetant (VEOZAH) 45 MG TABS Take 1 tablet (45 mg total) by mouth at bedtime. 11/06/22  Yes Duane Lope  H, MD  hydrOXYzine (ATARAX) 50 MG tablet Take 50 mg by mouth daily. Additional 50 mg if needed for anxiety   Yes [provider]  losartan (COZAAR) 50 MG tablet TAKE 1 Tablet BY MOUTH ONCE EVERY DAY 09/18/22  Yes Gilmore Laroche, FNP  omeprazole (PRILOSEC) 40 MG capsule TAKE 1 Capsule BY MOUTH ONCE EVERY DAY 08/03/22  Yes Gilmore Laroche, FNP  pregabalin (LYRICA) 150 MG capsule Take 150 mg by mouth 2 (two) times daily. 11/30/22  Yes [provider]  tiZANidine (ZANAFLEX) 2 MG tablet Take 2 mg by mouth daily. 02/02/22  Yes [provider]  traZODone (DESYREL) 50 MG tablet Take 50 mg by mouth at bedtime.   Yes [provider]    Allergies   Allergies  Allergen  Reactions   Codeine Nausea And Vomiting    High doses of codeine pt reports greater than  she can't tolerate    Methocarbamol Itching    "feels like bugs are crawling under skin"   Nsaids     Other reaction(s): Contraindicated CKD, only one kidney Other reaction(s): Contraindicated CKD, only one kidney    Review of Systems  ROS  Neurologic Exam  Awake, alert, oriented Memory and concentration grossly intact Speech fluent, appropriate CN grossly intact Motor exam: Upper Extremities Deltoid Bicep Tricep Grip  Right 5/5 5/5 5/5 5/5  Left 5/5 5/5 5/5 5/5   Lower Extremities IP Quad PF DF EHL  Right 5/5 5/5 5/5 5/5 5/5  Left 5/5 5/5 5/5 5/5 5/5   Sensation grossly intact to LT  Impression  - 52 y.o. female with clinical and electrodiagnostic evidence of right ulnar nerve release  Plan  - Will proceed with right ulnar nerve release at the elbow  I have reviewed the indications for the procedure as well as the details of the procedure and the expected postoperative course and recovery at length with the patient in the office. We have also reviewed in detail the risks, benefits, and alternatives to the procedure. All questions were answered and Briana Wyatt provided informed consent to proceed.  Lisbeth Renshaw, MD [facility]

## 2022-12-16 NOTE — Anesthesia Postprocedure Evaluation (Signed)
Anesthesia Post Note  Patient: Briana Wyatt  Procedure(s) Performed: ULNAR NERVE RELEASE AT ELBOW (Right)     Patient location during evaluation: PACU Anesthesia Type: MAC Level of consciousness: awake and alert Pain management: pain level controlled Vital Signs Assessment: post-procedure vital signs reviewed and stable Respiratory status: spontaneous breathing, nonlabored ventilation, respiratory function stable and patient connected to nasal cannula oxygen Cardiovascular status: stable and blood pressure returned to baseline Postop Assessment: no apparent nausea or vomiting Anesthetic complications: no   No notable events documented.  Last Vitals:  Vitals:   12/16/22 0908 12/16/22 0915  BP:  (!) 114/90  Pulse:  70  Resp:  (!) 8  Temp: (!) 36.1 C (!) 36.2 C  SpO2:  95%    Last Pain:  Vitals:   12/16/22 0915  TempSrc:   PainSc: 0-No pain                 Collene Schlichter

## 2022-12-16 NOTE — Op Note (Signed)
  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 Lisbeth Renshaw, MD  ASSISTANT: None  ANESTHESIA: MAC with local  EBL: Minimal  SPECIMENS: None  DRAINS: None  COMPLICATIONS: None immediate  CONDITION: Stable to PACU  HISTORY: Briana Wyatt is a 52 y.o. female initially presenting to the outpatient neurosurgery clinic with multiple varied pain complaints, 1 of which included right-sided arm pain and paresthesias consistent with ulnar neuropathy.  She underwent electrodiagnostic testing confirming ulnar neuropathy at the elbow.  She therefore elected to proceed with surgical decompression.  The risks, benefits, and alternatives to surgery were all reviewed in detail with the patient in the office.  After all her questions were answered informed consent was obtained and witnessed.  PROCEDURE IN DETAIL: The patient was brought to the operating room. After induction of anesthesia, the patient was positioned on the operative table in the supine position. All pressure points were meticulously padded.  Skin of the right arm was then prepped and draped in the usual sterile fashion.  After timeout was conducted, a small curvilinear skin incision was infiltrated with local anesthetic, just underneath the medial epicondyle.  Incision was then made sharply and carried down through subcutaneous tissue.  The ulnar nerve was easily identified inferior to the epicondyle.  External neurolysis was performed, and the nerve was traced proximally into the arm and its emergence from the tricep muscle.  No significant compression was noted proximally.  In a similar fashion, external neurolysis was performed more distally, and there did appear to be a significant amount of compression as the nerve entered the forearm and into the FCU.  There was a musculotendinous band which was  divided and this allowed complete decompression of the nerve at its entrance into the muscle.  At this point the wound was irrigated.  No active bleeding was identified.  Deep subcuticular stitches were placed.  The skin was closed with interrupted vertical mattress 3-0 nylon stitches.  Bacitracin ointment and sterile dressing was applied.  The arm was then wrapped.  Patient was then taken to the postanesthesia care unit in stable hemodynamic condition.  At the end of the case all sponge, needle, instrument, and cottonoid counts were correct.   Lisbeth Renshaw, MD [facility]

## 2022-12-16 NOTE — Anesthesia Procedure Notes (Signed)
Procedure Name: MAC Date/Time: 12/16/2022 8:08 AM  Performed by: Zollie Beckers, CRNAPre-anesthesia Checklist: Patient identified, Emergency Drugs available, Suction available and Patient being monitored Patient Re-evaluated:Patient Re-evaluated prior to induction Oxygen Delivery Method: Nasal cannula Placement Confirmation: positive ETCO2

## 2022-12-16 NOTE — Transfer of Care (Signed)
Immediate Anesthesia Transfer of Care Note  Patient: Briana Wyatt  Procedure(s) Performed: ULNAR NERVE RELEASE AT ELBOW (Right)  Patient Location: PACU  Anesthesia Type:MAC  Level of Consciousness: awake and alert   Airway & Oxygen Therapy: Patient Spontanous Breathing  Post-op Assessment: Report given to RN and Post -op Vital signs reviewed and stable  Post vital signs: Reviewed and stable  Last Vitals:  Vitals Value Taken Time  BP 123/98 12/16/22 0907  Temp    Pulse 70 12/16/22 0909  Resp 10 12/16/22 0909  SpO2 95 % 12/16/22 0909  Vitals shown include unvalidated device data.  Last Pain:  Vitals:   12/16/22 0612  TempSrc:   PainSc: 8       Patients Stated Pain Goal: 0 (12/16/22 0612)  Complications: No notable events documented.

## 2022-12-16 NOTE — Discharge Summary (Signed)
Physician Discharge Summary  Patient ID: Briana Wyatt MRN: [id number] DOB/AGE: Sep 25, 1970 52 y.o.  Admit date: 12/16/2022 Discharge date: 12/16/2022  Admission Diagnoses:  Right ulnar neuropathy  Discharge Diagnoses:  Same Active Problems:   * No active hospital problems. *   Discharged Condition: Stable  Hospital Course:  Briana Wyatt is a 52 y.o. female who underwent uncomplicated right ulnar nerve release at the elbow. She was at baseline postop and discharged from PACU.  Treatments: Surgery - Right ulnar nerve release  Discharge Exam: Blood pressure 120/77, pulse 77, temperature (!) 97.5 F (36.4 C), temperature source Oral, resp. rate 18, height  (1.651 m), weight 93 kg, last menstrual period 08/25/2019, SpO2 91 %. Awake, alert, oriented Speech fluent, appropriate CN grossly intact 5/5 BUE/BLE Wound c/d/i  Disposition: Discharge disposition: 01-Home or Self Care       Discharge Instructions      Remove dressing in 72 hours   Complete by: As directed    Call MD for:  redness, tenderness, or signs of infection (pain, swelling, redness, odor or green/yellow discharge around incision site)   Complete by: As directed    Call MD for:  temperature >100.4   Complete by: As directed    Diet - low sodium heart healthy   Complete by: As directed    Discharge instructions   Complete by: As directed    Walk at home as much as possible, at least 4 times / day   Increase activity slowly   Complete by: As directed    Lifting restrictions   Complete by: As directed    No lifting > 10 lbs   May shower / Bathe   Complete by: As directed    48 hours after surgery   May walk up steps   Complete by: As directed    Other Restrictions   Complete by: As directed    No bending/twisting at waist      Allergies as of 12/16/2022       Reactions   Codeine Nausea And Vomiting   High doses of codeine pt reports greater than  she can't tolerate    Methocarbamol Itching   "feels like bugs are crawling under skin"   Nsaids    Other reaction(s): Contraindicated CKD, only one kidney Other reaction(s): Contraindicated CKD, only one kidney        Medication List     TAKE these medications    acetaminophen 650 MG CR tablet Commonly known as: TYLENOL Take 650 mg by mouth every 8 (eight) hours as needed for pain or fever.   AIMOVIG (140 MG DOSE) Briana Wyatt Inject 140 mg into the skin every 30 (thirty) days. One a month   albuterol 108 (90 Base) MCG/ACT inhaler Commonly known as: VENTOLIN HFA Inhale 2 puffs into the lungs daily as needed for wheezing or shortness of breath (COPD).   ARIPiprazole 5 MG tablet Commonly known as: ABILIFY Take 5 mg by mouth daily.   cetirizine 10 MG tablet Commonly known as: ZYRTEC Take 10 mg by mouth daily.   DULoxetine 30 MG capsule Commonly known as: CYMBALTA Take 1 capsule (30 mg total) by mouth 2 (two) times daily.   HYDROcodone-acetaminophen 5-325 MG tablet Commonly known as: NORCO/VICODIN Take 1 tablet by mouth every 6 (six) hours as needed for up to 5 days for moderate pain.   hydrOXYzine 50 MG tablet Commonly known as: ATARAX Take 50 mg by mouth daily. Additional 50 mg if needed for anxiety  losartan 50 MG tablet Commonly known as: COZAAR TAKE 1 Tablet BY MOUTH ONCE EVERY DAY   omeprazole 40 MG capsule Commonly known as: PRILOSEC TAKE 1 Capsule BY MOUTH ONCE EVERY DAY   pregabalin 150 MG capsule Commonly known as: LYRICA Take 150 mg by mouth 2 (two) times daily.   tiZANidine 2 MG tablet Commonly known as: ZANAFLEX Take 2 mg by mouth daily.   traZODone 50 MG tablet Commonly known as: DESYREL Take 50 mg by mouth at bedtime.   Veozah 45 MG Tabs Generic drug: Fezolinetant Take 1 tablet (45 mg total) by mouth at bedtime.        Follow-up Information     Lisbeth Renshaw, MD Follow up.   Specialty: Neurosurgery Why: Already has appt scheduled Contact  information: [address] [place] [address] [phone]                 Signed: Jackelyn Hoehn 12/16/2022, 9:04 AM

## 2022-12-17 ENCOUNTER — Ambulatory Visit: Payer: Medicaid Other | Admitting: Obstetrics & Gynecology

## 2022-12-17 ENCOUNTER — Encounter (HOSPITAL_COMMUNITY): Payer: Self-pay | Admitting: Neurosurgery

## 2022-12-18 ENCOUNTER — Encounter (INDEPENDENT_AMBULATORY_CARE_PROVIDER_SITE_OTHER): Payer: Medicaid Other | Admitting: Cardiology

## 2022-12-18 DIAGNOSIS — G4733 Obstructive sleep apnea (adult) (pediatric): Secondary | ICD-10-CM | POA: Diagnosis not present

## 2022-12-20 ENCOUNTER — Ambulatory Visit: Payer: Medicaid Other | Attending: Family

## 2022-12-20 DIAGNOSIS — R0683 Snoring: Secondary | ICD-10-CM

## 2022-12-20 NOTE — Procedures (Signed)
Patient Information Study Date: 12/18/2022 Patient Name: Briana Wyatt Patient ID: [id number] Birth Date: 08-21-1971 Age: 52 Gender: Female BMI: 34.5 (W=207 lb, H=5' 5'') Referring Physician: Gillian Shields, NP  TEST DESCRIPTION: Home sleep apnea testing was completed using the WatchPat, a Type 1 device, utilizing peripheral arterial tonometry (PAT), chest movement, actigraphy, pulse oximetry, pulse rate, body position and snore. AHI was calculated with apnea and hypopnea using valid sleep time as the denominator. RDI includes apneas, hypopneas, and RERAs. The data acquired and the scoring of sleep and all associated events were performed in accordance with the recommended standards and specifications as outlined in the AASM Manual for the Scoring of Sleep and Associated Events 2.2.0 (2015).   FINDINGS:   1. Mild Obstructive Sleep Apnea with AHI 12.1/hr and Severe during REM sleep with a REM AHI of 44.7/hr.   2. No Central Sleep Apnea with pAHIc 0/hr.   3. Oxygen desaturations as low as 76%.   4. Mild snoring was present. O2 sats were < 88% for 9.4 min.   5. Total sleep time was 7 hrs and 34 min.   6. 22.7% of total sleep time was spent in REM sleep.   7. Normal sleep onset latency at 17 min.   8. Prolonged REM sleep onset latency at 255 min.   9. Total awakenings were 7.  10. Arrhythmia detection: None.  DIAGNOSIS: Mild Obstructive Sleep Apnea (G47.33) Nocturnal Hypoxemia  RECOMMENDATIONS:   1.  Clinical correlation of these findings is necessary.  The decision to treat obstructive sleep apnea (OSA) is usually based on the presence of apnea symptoms or the presence of associated medical conditions such as Hypertension, Congestive Heart Failure, Atrial Fibrillation or Obesity.  The most common symptoms of OSA are snoring, gasping for breath while sleeping, daytime sleepiness and fatigue.   2.  Initiating apnea therapy is recommended given the presence of symptoms and/or  associated conditions. Recommend proceeding with one of the following:     a.  Auto-CPAP therapy with a pressure range of 5-20cm H2O.     b.  An oral appliance (OA) that can be obtained from certain dentists with expertise in sleep medicine.  These are primarily of use in non-obese patients with mild and moderate disease.     c.  An ENT consultation which may be useful to look for specific causes of obstruction and possible treatment options.     d.  If patient is intolerant to PAP therapy, consider referral to ENT for evaluation for hypoglossal nerve stimulator.   3.  Close follow-up is necessary to ensure success with CPAP or oral appliance therapy for maximum benefit.  4.  A follow-up oximetry study on CPAP is recommended to assess the adequacy of therapy and determine the need for supplemental oxygen or the potential need for Bi-level therapy.  An arterial blood gas to determine the adequacy of baseline ventilation and oxygenation should also be considered.  5.  Healthy sleep recommendations include:  adequate nightly sleep (normal 7-9 hrs/night), avoidance of caffeine after noon and alcohol near bedtime, and maintaining a sleep environment that is cool, dark and quiet.  6.  Weight loss for overweight patients is recommended.  Even modest amounts of weight loss can significantly improve the severity of sleep apnea.  7.  Snoring recommendations include:  weight loss where appropriate, side sleeping, and avoidance of alcohol before bed.  8.  Operation of motor vehicle should be avoided when sleepy.  Signature: Armanda Magic, MD; [facility];  Diplomat, Biomedical engineer of Sleep Medicine Electronically Signed: 12/20/2022 7:57:16 PM .

## 2022-12-22 ENCOUNTER — Ambulatory Visit: Payer: Medicaid Other | Admitting: Orthopedic Surgery

## 2022-12-23 ENCOUNTER — Encounter: Payer: Self-pay | Admitting: Orthopedic Surgery

## 2022-12-23 ENCOUNTER — Ambulatory Visit (INDEPENDENT_AMBULATORY_CARE_PROVIDER_SITE_OTHER): Payer: Medicaid Other | Admitting: Orthopedic Surgery

## 2022-12-23 DIAGNOSIS — M7061 Trochanteric bursitis, right hip: Secondary | ICD-10-CM

## 2022-12-23 DIAGNOSIS — N938 Other specified abnormal uterine and vaginal bleeding: Secondary | ICD-10-CM | POA: Insufficient documentation

## 2022-12-23 NOTE — Progress Notes (Signed)
Orthopaedic Clinic Return  Assessment: Briana Wyatt is a 52 y.o. female with the following: Right greater trochanteric bursitis  Plan: Briana Wyatt has pain in the lateral hip, consistent with greater trochanteric bursitis.  I have previously injected her, and this provided good relief of her symptoms for a couple of months.  She is interested in a repeat injection today, and this was completed without issues.  Procedure note injection - Right lateral hip   Verbal consent was obtained to inject the Right lateral hip.  Patient localized the pain. Timeout was completed to confirm the site of injection.  The skin was prepped with alcohol and ethyl chloride was sprayed at the injection site.  A 21-gauge needle was used to inject 40 mg of Depo-Medrol and 1% lidocaine (4 cc) into the Right lateral hip, directly over the localized tenderness using a direct lateral approach.  There were no complications. A sterile bandage was applied.    Follow-up: Return if symptoms worsen or fail to improve.   Subjective:  Chief Complaint  Patient presents with   Hip Pain    Right wants injection states she thinks last injection helped but she didn't realize it helped until it wore off     History of Present Illness: Briana Wyatt is a 52 y.o. female who returns to clinic for evaluation of right hip pain.  She has pain over the lateral hip.  I have previously seen her for greater trochanteric bursitis.  Previous injection was helpful.  She states it provided relief of her symptoms for over a month.  More specifically, she stated she did not realize it helped, until it started to wear off and her pain was worse.  Of note, she is just recently undergone decompression of the ulnar nerve at the cubital tunnel.   Review of Systems: No fevers or chills + numbness & tingling No chest pain No shortness of breath No bowel or bladder dysfunction No GI distress No headaches   Objective: LMP  08/25/2019   Physical Exam:  Alert and oriented.  No acute distress.  Right-sided antalgic gait.  Tenderness palpation along the lower back.  She has tenderness to palpation over the greater trochanter on the right.  Toes warm and well-perfused.   IMAGING: I personally ordered and reviewed the following images:  No new imaging obtained today.  Oliver Barre, MD 12/23/2022 10:45 AM

## 2022-12-23 NOTE — Patient Instructions (Signed)

## 2022-12-28 ENCOUNTER — Other Ambulatory Visit (HOSPITAL_COMMUNITY): Payer: Self-pay | Admitting: Obstetrics & Gynecology

## 2022-12-28 ENCOUNTER — Ambulatory Visit: Payer: Medicaid Other | Admitting: Obstetrics & Gynecology

## 2022-12-28 DIAGNOSIS — Z1231 Encounter for screening mammogram for malignant neoplasm of breast: Secondary | ICD-10-CM

## 2022-12-31 ENCOUNTER — Ambulatory Visit (HOSPITAL_COMMUNITY): Payer: Medicaid Other

## 2022-12-31 ENCOUNTER — Encounter (HOSPITAL_COMMUNITY): Payer: Self-pay

## 2023-01-06 ENCOUNTER — Ambulatory Visit (HOSPITAL_COMMUNITY)
Admission: RE | Admit: 2023-01-06 | Discharge: 2023-01-06 | Disposition: A | Discharge status: Home / Self Care | Payer: Medicaid Other | Source: Ambulatory Visit | Attending: Obstetrics & Gynecology | Admitting: Obstetrics & Gynecology

## 2023-01-06 DIAGNOSIS — Z1231 Encounter for screening mammogram for malignant neoplasm of breast: Secondary | ICD-10-CM | POA: Insufficient documentation

## 2023-01-19 ENCOUNTER — Ambulatory Visit (INDEPENDENT_AMBULATORY_CARE_PROVIDER_SITE_OTHER): Payer: Medicaid Other | Admitting: Obstetrics & Gynecology

## 2023-01-19 ENCOUNTER — Encounter: Payer: Self-pay | Admitting: Obstetrics & Gynecology

## 2023-01-19 VITALS — BP 138/87 | HR 88 | Ht 65.0 in | Wt 207.0 lb

## 2023-01-19 DIAGNOSIS — R102 Pelvic and perineal pain: Secondary | ICD-10-CM | POA: Diagnosis not present

## 2023-01-19 DIAGNOSIS — D219 Benign neoplasm of connective and other soft tissue, unspecified: Secondary | ICD-10-CM

## 2023-01-19 DIAGNOSIS — N951 Menopausal and female climacteric states: Secondary | ICD-10-CM | POA: Diagnosis not present

## 2023-01-19 NOTE — Progress Notes (Signed)
Follow up appointment for results: Pelvic pain  Chief Complaint  Patient presents with   Follow-up    Wants to discuss getting a hysterectomy, also wants ovarian cyst removed.     Blood pressure 138/87, pulse 88, height 5\' 5"  (1.651 m), weight 207 lb (93.9 kg), last menstrual period 08/25/2019.  Still has pelvic pain CT images reviewed again with patient, samll insignificant fibroids  MEDS ordered this encounter: No orders of the defined types were placed in this encounter.   Orders for this encounter: No orders of the defined types were placed in this encounter.   Impression + Management Plan   ICD-10-CM   1. Fibroids, small diagnosed with CT scan done for other reasons  D21.9     2. Pelvic pain  R10.2    I told patient I think it unlikely her fibroids are a source of her pain but she is considering hysterectomy, I told her to think about hard first    3. Vasomotor symptoms due to menopause-->great response to veozah 45 mg qhs, continue  N95.1       Follow Up: Return if symptoms worsen or fail to improve, for [employer] is in your court. She will send my chart message with her decision     All questions were answered.  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   Dyspnea    Fatty liver    Fibromyalgia    Headache    Hypertension    Hypothyroidism    Kidney disease, chronic, stage III (moderate, EGFR 30-59 ml/min) (HCC)    Movement disorder    Pre-diabetes    PTSD (post-traumatic stress disorder)    Restless leg syndrome    Sciatica of right side    Spondylosis    Cervical and lumbar   Substance abuse (HCC)    Hx Crack/cocaine   Tortuous colon     Past Surgical History:  Procedure Laterality Date   BIOPSY  04/29/2022   Procedure: BIOPSY;  Surgeon: Dolores Frame, MD;  Location: [facility];  Service: Gastroenterology;;   Emiliano Dyer TUNNEL RELEASE Bilateral    COLONOSCOPY WITH PROPOFOL N/A 04/29/2022   Procedure: COLONOSCOPY WITH PROPOFOL;  Surgeon: Dolores Frame, MD;  Location: [facility];  Service: Gastroenterology;  Laterality: N/A;  145   ECTOPIC PREGNANCY SURGERY     ESOPHAGOGASTRODUODENOSCOPY (EGD) WITH PROPOFOL N/A 04/29/2022   Procedure: ESOPHAGOGASTRODUODENOSCOPY (EGD) WITH PROPOFOL;  Surgeon: Dolores Frame, MD;  Location: [facility];  Service: Gastroenterology;  Laterality: N/A;   SHOULDER SURGERY Right    SHOULDER SURGERY Right    TUBAL LIGATION     ULNAR NERVE TRANSPOSITION Right 12/16/2022   Procedure: ULNAR NERVE RELEASE AT ELBOW;  Surgeon: Lisbeth Renshaw, MD;  Location[facility];  Service: Neurosurgery;  Laterality: Right;    OB History     Gravida  3   Para  3   Term  3   Preterm      AB      Living  3      SAB      IAB      Ectopic      Multiple      Live Births  Allergies  Allergen Reactions   Codeine Nausea And Vomiting    High doses of codeine pt reports greater than 30mg  she can't tolerate    Methocarbamol Itching    "feels like bugs are crawling under skin"   Nsaids     Other reaction(s): Contraindicated CKD, only one kidney Other reaction(s): Contraindicated CKD, only one kidney    Social History   Socioeconomic History   Marital status: Single    Spouse name: Not on file   Number of children: 3   Years of education: Not on file   Highest education level: GED or equivalent  Occupational History   Not on file  Tobacco Use   Smoking status: Former    Types: Cigarettes    Quit date: 08/25/1995    Years since quitting: 27.4    Passive exposure: Past   Smokeless tobacco: Never  Vaping Use   Vaping Use: Never used  Substance and Sexual Activity   Alcohol use: No   Drug use: Yes    Types: "Crack" cocaine    Comment: last used  one month ago   Sexual activity: Not Currently    Birth control/protection: Surgical  Other Topics Concern   Not on file  Social History Narrative   Not on file   Social Determinants of Health   Financial Resource Strain: Low Risk  (11/16/2022)   Overall Financial Resource Strain (CARDIA)    Difficulty of Paying Living Expenses: Not very hard  Food Insecurity: No Food Insecurity (11/16/2022)   Hunger Vital Sign    Worried About Running Out of Food in the Last Year: Never true    Ran Out of Food in the Last Year: Never true  Transportation Needs: Unmet Transportation Needs (11/16/2022)   PRAPARE - Administrator, Civil Service (Medical): Yes    Lack of Transportation (Non-Medical): No  Physical Activity: Insufficiently Active (11/16/2022)   Exercise Vital Sign    Days of Exercise per Week: 3 days    Minutes of Exercise per Session: 20 min  Stress: Stress Concern Present (11/16/2022)   [employer] of Occupational Health - Occupational Stress Questionnaire    Feeling of Stress : Very much  Social Connections: Unknown (11/16/2022)   Social Connection and Isolation Panel [NHANES]    Frequency of Communication with Friends and Family: More than three times a week    Frequency of Social Gatherings with Friends and Family: More than three times a week    Attends Religious Services: Patient declined    Database administrator or Organizations: No    Attends Banker Meetings: Never    Marital Status: Divorced    Family History  Problem Relation Age of Onset   Hypertension Father    Stroke Father    Hypertension Sister    Dystonia Sister

## 2023-01-20 ENCOUNTER — Encounter: Payer: Self-pay | Admitting: Orthopedic Surgery

## 2023-01-20 ENCOUNTER — Other Ambulatory Visit (INDEPENDENT_AMBULATORY_CARE_PROVIDER_SITE_OTHER): Payer: Medicaid Other

## 2023-01-20 ENCOUNTER — Ambulatory Visit: Payer: Medicaid Other | Admitting: Orthopedic Surgery

## 2023-01-20 VITALS — BP 143/88 | HR 80 | Ht 66.0 in | Wt 205.0 lb

## 2023-01-20 DIAGNOSIS — M25512 Pain in left shoulder: Secondary | ICD-10-CM | POA: Diagnosis not present

## 2023-01-20 DIAGNOSIS — G8929 Other chronic pain: Secondary | ICD-10-CM | POA: Diagnosis not present

## 2023-01-20 NOTE — Progress Notes (Signed)
Orthopaedic Clinic Return  Assessment: Atiya Wyatt is a 52 y.o. female with the following: Chronic left shoulder pain   Plan: [name] has left shoulder pain that has been progressively worsening.  No specific injury.  She states the pain in the left shoulder, is similar to the pain she had in the right shoulder before she required rotator cuff repair.  Medications have not been helpful.  I offered her an injection in clinic today, and she elected to proceed.  This was completed in clinic today without issues.  Procedure note injection Left shoulder    Verbal consent was obtained to inject the left shoulder, subacromial space Timeout was completed to confirm the site of injection.  The skin was prepped with alcohol and ethyl chloride was sprayed at the injection site.  A 21-gauge needle was used to inject 40 mg of Depo-Medrol and 1% lidocaine (3 cc) into the subacromial space of the left shoulder using a posterolateral approach.  There were no complications. A sterile bandage was applied.   Follow-up: Return if symptoms worsen or fail to improve.   Subjective:  Chief Complaint  Patient presents with   Left shoulder pain    No specific injury    History of Present Illness: Briana Wyatt is a 52 y.o. female who returns to clinic for evaluation of left shoulder pain.  She is left-hand dominant.  She has been having progressively worsening pain in the left shoulder.  She states her pain is currently similar to the pain that she had previously for her right shoulder.  Ultimately, she required rotator cuff repair surgery on the right.  Medications have not been effective.  She has difficulty with overhead motion.  She is interested in an injection.  Review of Systems: No fevers or chills No numbness or tingling No chest pain No shortness of breath No bowel or bladder dysfunction No GI distress No headaches   Objective: BP (!) 143/88   Pulse 80   Ht 5\' 6"  (1.676  m)   Wt 205 lb (93 kg)   LMP 08/25/2019   BMI 33.09 kg/m   Physical Exam:  Alert and oriented.  No acute distress.  Left shoulder without deformity.  No bruising.  No swelling.  She has tenderness to palpation diffusely.  Forward flexion is limited to approximately 110 degrees.  She has pain with additional motion.  Positive Jobe's.  Tenderness palpation over the anterior shoulder.  Fingers are warm and well-perfused.   IMAGING: I personally ordered and reviewed the following images:  X-rays of the left shoulder obtained in clinic today.  No acute injuries are noted.  Well-positioned glenohumeral joint.  Noticed proximal humeral migration.  There are some degenerative changes at the footprint of the rotator cuff.  There appear to be some chronic changes in the undersurface of the acromion.  Impression: Negative left shoulder x-ray  Oliver Barre, MD 01/20/2023 4:22 PM

## 2023-01-20 NOTE — Patient Instructions (Signed)

## 2023-01-21 ENCOUNTER — Telehealth: Payer: Self-pay | Admitting: Orthopedic Surgery

## 2023-01-21 NOTE — Telephone Encounter (Signed)
Returned the pt's call, lvm for her to call us back.  She had left a message stating that when she was here for her appointment yesterday that she heard someone else making an appointment for their feet and she's interested in scheduling an appointment for her feet.

## 2023-01-25 ENCOUNTER — Ambulatory Visit (HOSPITAL_COMMUNITY)
Admission: RE | Admit: 2023-01-25 | Discharge: 2023-01-25 | Disposition: A | Discharge status: Home / Self Care | Payer: Medicaid Other | Source: Ambulatory Visit | Attending: Neurosurgery | Admitting: Neurosurgery

## 2023-01-25 DIAGNOSIS — M47812 Spondylosis without myelopathy or radiculopathy, cervical region: Secondary | ICD-10-CM | POA: Diagnosis present

## 2023-01-26 ENCOUNTER — Encounter: Payer: Medicaid Other | Admitting: Orthopedic Surgery

## 2023-01-27 ENCOUNTER — Telehealth: Payer: Self-pay | Admitting: *Deleted

## 2023-01-27 DIAGNOSIS — G4733 Obstructive sleep apnea (adult) (pediatric): Secondary | ICD-10-CM

## 2023-01-27 DIAGNOSIS — I2510 Atherosclerotic heart disease of native coronary artery without angina pectoris: Secondary | ICD-10-CM

## 2023-01-27 DIAGNOSIS — I10 Essential (primary) hypertension: Secondary | ICD-10-CM

## 2023-01-27 NOTE — Telephone Encounter (Signed)
-----   Message from Gaynelle Cage, CMA sent at 12/25/2022 10:55 AM EDT -----  ----- Message ----- From: Quintella Reichert, MD Sent: 12/20/2022   7:59 PM EDT To: [facility]  Please let patient know that they have sleep apnea.  Recommend therapeutic CPAP titration for treatment of patient's sleep disordered breathing.  If unable to perform an in lab titration then initiate ResMed auto CPAP from 4 to 15cm H2O with heated humidity and mask of choice and overnight pulse ox on CPAP.

## 2023-01-27 NOTE — Telephone Encounter (Signed)
The patient has been notified of the result. Left detailed message on voicemail and informed patient to call back..Rie Mcneil Green, CMA   

## 2023-02-02 NOTE — Telephone Encounter (Signed)
The patient has been notified of the result and verbalized understanding.  All questions (if any) were answered. Latrelle Dodrill, CMA 02/02/2023 4:01 PM    Will precert titration

## 2023-02-02 NOTE — Addendum Note (Signed)
Addended by: Reesa Chew on: 02/02/2023 04:02 PM   Modules accepted: Orders

## 2023-02-09 ENCOUNTER — Other Ambulatory Visit: Payer: Self-pay | Admitting: Neurosurgery

## 2023-02-10 ENCOUNTER — Encounter (INDEPENDENT_AMBULATORY_CARE_PROVIDER_SITE_OTHER): Payer: Self-pay | Admitting: Gastroenterology

## 2023-02-10 ENCOUNTER — Other Ambulatory Visit: Payer: Self-pay | Admitting: Family Medicine

## 2023-02-10 DIAGNOSIS — K219 Gastro-esophageal reflux disease without esophagitis: Secondary | ICD-10-CM

## 2023-02-11 NOTE — Pre-Procedure Instructions (Signed)
Surgical Instructions   Your procedure is scheduled on Tuesday, July 2nd. Report to [facility] Main Entrance "A" at 09:50 A.M., then check in with the Admitting office. Any questions or running late day of surgery: call [phone]  Questions prior to your surgery date: call [phone], Monday-Friday, 8am-4pm. If you experience any cold or flu symptoms such as cough, fever, chills, shortness of breath, etc. between now and your scheduled surgery, please notify us at the above number.     Remember:  Do not eat or drink after midnight the night before your surgery      Take these medicines the morning of surgery with A SIP OF WATER  ARIPiprazole (ABILIFY)  atorvastatin (LIPITOR)  cetirizine (ZYRTEC)  DULoxetine (CYMBALTA)  omeprazole (PRILOSEC)  pregabalin (LYRICA)    May take these medicines IF NEEDED: acetaminophen (TYLENOL)  albuterol (VENTOLIN HFA)- bring inhaler with you on day of surgery hydrOXYzine (ATARAX)  tiZANidine (ZANAFLEX)    One week prior to surgery, STOP taking any Aspirin (unless otherwise instructed by your surgeon) Aleve, Naproxen, Ibuprofen, Motrin, Advil, Goody's, BC's, all herbal medications, fish oil, and non-prescription vitamins.                     Do NOT Smoke (Tobacco/Vaping) for 24 hours prior to your procedure.  If you use a CPAP at night, you may bring your mask/headgear for your overnight stay.   You will be asked to remove any contacts, glasses, piercing's, hearing aid's, dentures/partials prior to surgery. Please bring cases for these items if needed.    Patients discharged the day of surgery will not be allowed to drive home, and someone needs to stay with them for 24 hours.  SURGICAL WAITING ROOM VISITATION Patients may have no more than 2 support people in the waiting area - these visitors may rotate.   Pre-op nurse will coordinate an appropriate time for 1 ADULT support person, who may not rotate, to accompany patient in pre-op.   Children under the age of 68 must have an adult with them who is not the patient and must remain in the main waiting area with an adult.  If the patient needs to stay at the hospital during part of their recovery, the visitor guidelines for inpatient rooms apply.  Please refer to the [facility] website for the visitor guidelines for any additional information.   If you received a COVID test during your pre-op visit  it is requested that you wear a mask when out in public, stay away from anyone that may not be feeling well and notify your surgeon if you develop symptoms. If you have been in contact with anyone that has tested positive in the last 10 days please notify you surgeon.      Pre-operative 5 CHG Bath Instructions   You can play a key role in reducing the risk of infection after surgery. Your skin needs to be as free of germs as possible. You can reduce the number of germs on your skin by washing with CHG (chlorhexidine gluconate) soap before surgery. CHG is an antiseptic soap that kills germs and continues to kill germs even after washing.   DO NOT use if you have an allergy to chlorhexidine/CHG or antibacterial soaps. If your skin becomes reddened or irritated, stop using the CHG and notify one of our RNs at [phone].   Please shower with the CHG soap starting 4 days before surgery using the following schedule:     Please  keep in mind the following:  DO NOT shave, including legs and underarms, starting the day of your first shower.   You may shave your face at any point before/day of surgery.  Place clean sheets on your bed the day you start using CHG soap. Use a clean washcloth (not used since being washed) for each shower. DO NOT sleep with pets once you start using the CHG.   CHG Shower Instructions:  If you choose to wash your hair and private area, wash first with your normal shampoo/soap.  After you use shampoo/soap, rinse your hair and body thoroughly to remove  shampoo/soap residue.  Turn the water OFF and apply about 3 tablespoons (45 ml) of CHG soap to a CLEAN washcloth.  Apply CHG soap ONLY FROM YOUR NECK DOWN TO YOUR TOES (washing for 3-5 minutes)  DO NOT use CHG soap on face, private areas, open wounds, or sores.  Pay special attention to the area where your surgery is being performed.  If you are having back surgery, having someone wash your back for you may be helpful. Wait 2 minutes after CHG soap is applied, then you may rinse off the CHG soap.  Pat dry with a clean towel  Put on clean clothes/pajamas   If you choose to wear lotion, please use ONLY the CHG-compatible lotions on the back of this paper.   Additional instructions for the day of surgery: DO NOT APPLY any lotions, deodorants, cologne, or perfumes.   Do not bring valuables to the hospital. [facility] is not responsible for any belongings/valuables. Do not wear nail polish, gel polish, artificial nails, or any other type of covering on natural nails (fingers and toes) Do not wear jewelry or makeup Put on clean/comfortable clothes.  Please brush your teeth.  Ask your nurse before applying any prescription medications to the skin.     CHG Compatible Lotions   Aveeno Moisturizing lotion  Cetaphil Moisturizing Cream  Cetaphil Moisturizing Lotion  Clairol Herbal Essence Moisturizing Lotion, Dry Skin  Clairol Herbal Essence Moisturizing Lotion, Extra Dry Skin  Clairol Herbal Essence Moisturizing Lotion, Normal Skin  Curel Age Defying Therapeutic Moisturizing Lotion with Alpha Hydroxy  Curel Extreme Care Body Lotion  Curel Soothing Hands Moisturizing Hand Lotion  Curel Therapeutic Moisturizing Cream, Fragrance-Free  Curel Therapeutic Moisturizing Lotion, Fragrance-Free  Curel Therapeutic Moisturizing Lotion, Original Formula  Eucerin Daily Replenishing Lotion  Eucerin Dry Skin Therapy Plus Alpha Hydroxy Crme  Eucerin Dry Skin Therapy Plus Alpha Hydroxy Lotion  Eucerin  Original Crme  Eucerin Original Lotion  Eucerin Plus Crme Eucerin Plus Lotion  Eucerin TriLipid Replenishing Lotion  Keri Anti-Bacterial Hand Lotion  Keri Deep Conditioning Original Lotion Dry Skin Formula Softly Scented  Keri Deep Conditioning Original Lotion, Fragrance Free Sensitive Skin Formula  Keri Lotion Fast Absorbing Fragrance Free Sensitive Skin Formula  Keri Lotion Fast Absorbing Softly Scented Dry Skin Formula  Keri Original Lotion  Keri Skin Renewal Lotion Keri Silky Smooth Lotion  Keri Silky Smooth Sensitive Skin Lotion  Nivea Body Creamy Conditioning Oil  Nivea Body Extra Enriched Lotion  Nivea Body Original Lotion  Nivea Body Sheer Moisturizing Lotion Nivea Crme  Nivea Skin Firming Lotion  NutraDerm 30 Skin Lotion  NutraDerm Skin Lotion  NutraDerm Therapeutic Skin Cream  NutraDerm Therapeutic Skin Lotion  ProShield Protective Hand Cream  Provon moisturizing lotion  Please read over the following fact sheets that you were given.

## 2023-02-12 ENCOUNTER — Encounter (HOSPITAL_COMMUNITY)
Admission: RE | Admit: 2023-02-12 | Discharge: 2023-02-12 | Disposition: A | Discharge status: Home / Self Care | Payer: Medicaid Other | Source: Ambulatory Visit | Attending: Neurosurgery | Admitting: Neurosurgery

## 2023-02-12 ENCOUNTER — Encounter (HOSPITAL_COMMUNITY): Payer: Self-pay

## 2023-02-12 ENCOUNTER — Other Ambulatory Visit: Payer: Self-pay

## 2023-02-12 VITALS — BP 120/81 | HR 69 | Temp 98.6°F | Resp 18 | Ht 65.0 in | Wt 200.1 lb

## 2023-02-12 DIAGNOSIS — G4733 Obstructive sleep apnea (adult) (pediatric): Secondary | ICD-10-CM | POA: Diagnosis not present

## 2023-02-12 DIAGNOSIS — N183 Chronic kidney disease, stage 3 unspecified: Secondary | ICD-10-CM | POA: Diagnosis not present

## 2023-02-12 DIAGNOSIS — K760 Fatty (change of) liver, not elsewhere classified: Secondary | ICD-10-CM | POA: Diagnosis not present

## 2023-02-12 DIAGNOSIS — M50322 Other cervical disc degeneration at C5-C6 level: Secondary | ICD-10-CM | POA: Insufficient documentation

## 2023-02-12 DIAGNOSIS — I129 Hypertensive chronic kidney disease with stage 1 through stage 4 chronic kidney disease, or unspecified chronic kidney disease: Secondary | ICD-10-CM | POA: Insufficient documentation

## 2023-02-12 DIAGNOSIS — M797 Fibromyalgia: Secondary | ICD-10-CM | POA: Diagnosis not present

## 2023-02-12 DIAGNOSIS — M47816 Spondylosis without myelopathy or radiculopathy, lumbar region: Secondary | ICD-10-CM | POA: Insufficient documentation

## 2023-02-12 DIAGNOSIS — M4802 Spinal stenosis, cervical region: Secondary | ICD-10-CM | POA: Diagnosis not present

## 2023-02-12 DIAGNOSIS — E039 Hypothyroidism, unspecified: Secondary | ICD-10-CM | POA: Diagnosis not present

## 2023-02-12 DIAGNOSIS — D259 Leiomyoma of uterus, unspecified: Secondary | ICD-10-CM | POA: Insufficient documentation

## 2023-02-12 DIAGNOSIS — Z01812 Encounter for preprocedural laboratory examination: Secondary | ICD-10-CM | POA: Diagnosis present

## 2023-02-12 DIAGNOSIS — K219 Gastro-esophageal reflux disease without esophagitis: Secondary | ICD-10-CM | POA: Diagnosis not present

## 2023-02-12 DIAGNOSIS — J449 Chronic obstructive pulmonary disease, unspecified: Secondary | ICD-10-CM | POA: Insufficient documentation

## 2023-02-12 DIAGNOSIS — R7303 Prediabetes: Secondary | ICD-10-CM | POA: Insufficient documentation

## 2023-02-12 DIAGNOSIS — Z01818 Encounter for other preprocedural examination: Secondary | ICD-10-CM

## 2023-02-12 DIAGNOSIS — G2581 Restless legs syndrome: Secondary | ICD-10-CM | POA: Insufficient documentation

## 2023-02-12 HISTORY — DX: Sleep apnea, unspecified: G47.30

## 2023-02-12 HISTORY — DX: Depression, unspecified: F32.A

## 2023-02-12 LAB — TYPE AND SCREEN
ABO/RH(D): A POS
Antibody Screen: NEGATIVE

## 2023-02-12 LAB — COMPREHENSIVE METABOLIC PANEL
ALT: 29 U/L (ref 0–44)
AST: 26 U/L (ref 15–41)
Albumin: 3.7 g/dL (ref 3.5–5.0)
Alkaline Phosphatase: 111 U/L (ref 38–126)
Anion gap: 9 (ref 5–15)
BUN: 23 mg/dL — ABNORMAL HIGH (ref 6–20)
CO2: 25 mmol/L (ref 22–32)
Calcium: 9.3 mg/dL (ref 8.9–10.3)
Chloride: 105 mmol/L (ref 98–111)
Creatinine, Ser: 1.19 mg/dL — ABNORMAL HIGH (ref 0.44–1.00)
GFR, Estimated: 54 mL/min — ABNORMAL LOW (ref >60)
Glucose, Bld: 111 mg/dL — ABNORMAL HIGH (ref 70–99)
Potassium: 4.1 mmol/L (ref 3.5–5.1)
Sodium: 139 mmol/L (ref 135–145)
Total Bilirubin: 0.6 mg/dL (ref 0.3–1.2)
Total Protein: 6.9 g/dL (ref 6.5–8.1)

## 2023-02-12 LAB — CBC
HCT: 38.3 % (ref 36.0–46.0)
Hemoglobin: 12.6 g/dL (ref 12.0–15.0)
MCH: 27.3 pg (ref 26.0–34.0)
MCHC: 32.9 g/dL (ref 30.0–36.0)
MCV: 83.1 fL (ref 80.0–100.0)
Platelets: 220 10*3/uL (ref 150–400)
RBC: 4.61 MIL/uL (ref 3.87–5.11)
RDW: 12.4 % (ref 11.5–15.5)
WBC: 6 10*3/uL (ref 4.0–10.5)
nRBC: 0 % (ref 0.0–0.2)

## 2023-02-12 LAB — SURGICAL PCR SCREEN
MRSA, PCR: NEGATIVE
Staphylococcus aureus: POSITIVE — AB

## 2023-02-12 NOTE — Progress Notes (Signed)
PCP - Coral Ceo, FNP Cardiologist - Dr. Dietrich Pates  PPM/ICD - denies   Chest x-ray - 03/31/22 EKG - 12/14/22 Stress Test - denies ECHO - 01/08/22 Cardiac Cath - denies  Sleep Study - 12/28/22, mild OSA CPAP - denies, pt states she is awaiting insurance approval for CPAP  DM- prediabetic  ASA/Blood Thinner Instructions: n/a   ERAS Protcol - no, NPO   COVID TEST- n/a   Anesthesia review: yes, cardiac hx  Patient denies shortness of breath, fever, cough and chest pain at PAT appointment   All instructions explained to the patient, with a verbal understanding of the material. Patient agrees to go over the instructions while at home for a better understanding.  The opportunity to ask questions was provided.

## 2023-02-16 ENCOUNTER — Other Ambulatory Visit: Payer: Self-pay | Admitting: Neurosurgery

## 2023-02-16 NOTE — Progress Notes (Signed)
Anesthesia Chart Review:  Case: [id number] Date/Time: 02/23/23 1135   Procedure: ACDF C45, C56, C67 - 3C   Anesthesia type: General   Pre-op diagnosis: CERVICAL SPINAL STENOSIS   [facility]: [facility] ROOM 20 / [facility] OR   Surgeons: Lisbeth Renshaw, MD       DISCUSSION: Patient is a 52 year old female scheduled for the above procedure.   History includes former smoker (quit 08/25/95), HTN, CKD (stage 3), congenital single kidney (chronic left renal atrophy 05/21/22 CT), COPD, dyspnea, PTSD, pre-diabetes, fatty liver, fibromyalgia, hypothyroidism, acid reflux, RLS, movement disorder, substance abuse (crack cocaine), depression (SI/SA 2023), ulnar neuropathy (s/p right ulnar nerve release 12/16/22). Mild OSA 12/18/22 sleep study--not currently using CPAP while awaiting insurance approval.    She had cardiologist evaluation by Dietrich Pates, MD on 11/12/21 for chest pain evaluation. Had been seen in ED on 09/07/21 and was also having body aches and migraine. Known history of crack cocaine use. She ruled out for ACS. Dr. Tenny Craw thought chest symptoms were more consistent with musculoskeletal. Echo ordered due to chronic dyspnea and done on 11/28/21 showing lateral wall hypokinesis, LVEF 50-55%, mildly dilated LV cavity, normal RVSF, trivial MR. A limited echo with Definity was ordered to further evaluate EF and wall motion and done on 01/08/22 showing LVEF 50-55% with hypokinesis of the inferolateral/lateral wall, low normal RVSF. A CCTA was then ordered and done on 02/02/22 showing Coronary Calcium Score of 0 with no evidence of CAD.  - Last cardiology evaluation was on 12/14/22 with Gillian Shields, PA-C for preoperative evaluation prior to ulnar nerve surgery. No ischemic testing recommended at that time.    Last crack cocaine use documented as 08/2022. Per communication in April 2024, she was living with her mom. Reported trigger for drug use was when she gets money, so her mother was helping to manage her finances.   No  additional cardiac testing recommended at April 2024 visit. Anesthesia team to evaluate on the day of surgery.    VS: BP 120/81   Pulse 69   Temp 37 C (Oral)   Resp 18   Ht 5\' 5"  (1.651 m)   Wt 90.8 kg   LMP 08/25/2019   SpO2 98%   BMI 33.30 kg/m    PROVIDERS: Wilmon Pali, FNP is PCP [facility] in [place]) - Dietrich Pates, MD is cardiologist  - Duane Lope, MD is GYN - Marguerita Merles, Reuel Boom, MD is GI - Ronny Bacon, NP is endocrinology provider. Last visit 07/21/22 for follow-up right thyroid nodule and previously abnormal (low)TSH which had resolved. Stable thyroid US 08/12/22 and normal thyroid panel 08/10/22. One year follow-up planned.    LABS: Labs reviewed: Acceptable for surgery. A1c 5.9% on 08/10/2022. Nonreactive hepatitis C and HIV antibody on 08/10/2022.  (all labs ordered are listed, but only abnormal results are displayed)  Labs Reviewed  SURGICAL PCR SCREEN - Abnormal; Notable for the following components:      Result Value   Staphylococcus aureus POSITIVE (*)    All other components within normal limits  COMPREHENSIVE METABOLIC PANEL - Abnormal; Notable for the following components:   Glucose, Bld 111 (*)    BUN 23 (*)    Creatinine, Ser 1.19 (*)    GFR, Estimated 54 (*)    All other components within normal limits  CBC  TYPE AND SCREEN    Sleep Study 12/18/22: FINDINGS:   1. Mild Obstructive Sleep Apnea with AHI 12.1/hr and Severe during REM sleep with a  REM AHI of 44.7/hr.   2. No Central Sleep Apnea with pAHIc 0/hr.   3. Oxygen desaturations as low as 76%.   4. Mild snoring was present. O2 sats were < 88% for 9.4 min.   5. Total sleep time was 7 hrs and 34 min.   6. 22.7% of total sleep time was spent in REM sleep.   7. Normal sleep onset latency at 17 min.   8. Prolonged REM sleep onset latency at 255 min.   9. Total awakenings were 7.  10. Arrhythmia detection: None. RECOMMENDATIONS:  1.  Clinical correlation of these  findings is necessary.  The decision to treat obstructive sleep apnea (OSA) is usually based on the presence of apnea symptoms or the presence of associated medical conditions...   2.  Initiating apnea therapy is recommended given the presence of symptoms and/or associated conditions. Recommend proceeding with one of the following:    a.  Auto-CPAP therapy.Marland KitchenMarland KitchenAn oral appliance (OA).Marland KitchenMarland KitchenAn ENT consultation.Marland KitchenMarland KitchenIf patient is intolerant to PAP therapy, consider referral to ENT for evaluation for hypoglossal nerve stimulator....   4.  A follow-up oximetry study on CPAP is recommended to assess the adequacy of therapy and determine the need for supplemental oxygen or the potential need for Bi-level therapy.  An arterial blood gas to determine the adequacy of baseline ventilation and oxygenation should also be considered...   IMAGES: MRI C-spine 01/25/23: IMPRESSION: 1. Multilevel cervical disc degeneration, worst at C5-6 where there is severe spinal stenosis and moderate bilateral neural foraminal stenosis. 2. Moderate spinal stenosis at C4-5 and C6-7. 3. Moderate neural foraminal stenosis on the right at C4-5 and on the left at C6-7.   US Thyroid 08/12/22: IMPRESSION: 1. Similar to slight interval enlargement of recently biopsied nodule in the right mid gland. Recommend correlation with prior biopsy results. 2. No new nodules or suspicious features. - S/p Right thyroid nodule FNA 11/13/21: Atypia of undetermined significance (Bethesda category III)    CT L-spine 08/08/22: IMPRESSION: 1. No acute fracture or traumatic malalignment of the lumbar spine. 2. Mild lumbar spondylosis.   CT Abd/pelvis 05/21/22: IMPRESSION: 1. No acute abnormality identified in the abdomen or pelvis. 2. Diffuse hepatic steatosis which in the setting of steatohepatitis can be a cause of abdominal pain. 3. Chronic left renal atrophy. 4. Uterine leiomyomas.   CT Chest (overread from CCTA) 02/02/22: IMPRESSION: 1. Mild  basilar atelectasis. 2. Hepatic steatosis difficult to quantify on the current study. Correlate with any clinical or laboratory evidence of hepatic dysfunction or with signs of metabolic syndrome.     OTHER: EGD 04/29/22: IMPRESSIONS: 1.  Esophageal mucosal changes suspicious for short segment Barrett's esophagus, classified as Barrett's stage C0-M1 per Prague criteria.  Biopsied. [Squamocolumnar junction mucosa with intestinal metaplasia consistent with Barrett's esophagus, negative for dysplasia] 2.  2 cm hiatal hernia. 3.  Gastroesophageal flap valve classified as Hill Grade II (fold present, opens with respiration). 4.  Erosive gastropathy with no stigmata of recent bleeding.  Biopsied. [Antral type mucosa with intestinal metaplasia, negative for dysplasia, no H. pylori organisms identified] 5.  Normal examined duodenum.   Colonoscopy 04/29/22: IMPRESSIONS: 1.  Hemorrhoids found on perianal exam. 2.  One 2 mm polyp in the transverse colon, removed with cold biopsy forceps.  Resected and retrieved. [Hyperplastic polyp] 3.  Tortuous colon. 4.  Nonbleeding external and internal hemorrhoids.     EKG: EKG 12/14/22: Normal sinus rhythm     CV: CT Coronary 02/02/22: IMPRESSION: 1. Coronary calcium score of 0.  This was 0 percentile for age and sex matched control. 2. Normal coronary origin with right dominance. 3. No evidence of CAD. CAD-RADS 0. No evidence of CAD (0%). Consider non-atherosclerotic causes of chest pain. - Reviewed by Dr. Tenny Craw, "CT of heart shows no signfiicant coronary artery plaquing Her calcium score is 0      Very good   CT scan does show fatty liver changes   Hepatic steatosis.   Needs to cut out sugars (less than 24 g added sugar per day), cut out alcohol if drinking    Low carb Send to PCP to review"     Echo (limited with Definity) 01/08/22: IMPRESSIONS   1. Limited images with Definity Acoustic windows difficult      OVeralll LVEF is appears to be  approximately 50 to 55% with  hypokinesis of the inferolateal/lateral wall (base, mid).   2. Right ventricular systolic function is low normal. The right  ventricular size is normal.  - Per Dr. Tenny Craw, "Echo (lmited) shows overall LVEF low normal to a little down   ONe wall doesn't appear to be moving as ell  But it was a very difficult study given acoustic windows I would reocmm  CT coronary angiogram to evaluate for CAD".     Echo 11/28/21: IMPRESSIONS   1. Difficult acoustic windows. Lateral wall appears hypokinetic. COnsider  limited echo with Defiinity to confirm wall motion. . Left ventricular  ejection fraction, by estimation, is 50 to 55%. The left ventricle has low  normal function. The left  ventricular internal cavity size was mildly dilated. Left ventricular  diastolic parameters were normal.   2. Right ventricular systolic function is normal. The right ventricular  size is normal.   3. The mitral valve is normal in structure. Trivial mitral valve  regurgitation.   4. The aortic valve is normal in structure. Aortic valve regurgitation is  not visualized.  - Dr. Tenny Craw recommended limited echo with Definity to confirm regional wall motion.   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   Depression    major   Dyspnea    Fatty liver    Fibromyalgia    Headache    Hypertension    Hypothyroidism    Kidney disease, chronic, stage III (moderate, EGFR 30-59 ml/min) (HCC)    Movement disorder    Pre-diabetes    PTSD (post-traumatic stress disorder)    Restless leg syndrome    Sciatica of right side    Sleep apnea    mild, pt states she is awaiting insurance approval for CPAP   Spondylosis    Cervical and lumbar   Substance abuse (HCC)    Hx Crack/cocaine (clean since 08/2022  per pt)   Tortuous colon     Past Surgical History:  Procedure Laterality Date   BIOPSY  04/29/2022   Procedure: BIOPSY;  Surgeon: Dolores Frame, MD;  [facility]: [facility];  Service: Gastroenterology;;   Emiliano Dyer TUNNEL RELEASE Bilateral    COLONOSCOPY WITH PROPOFOL N/A 04/29/2022   Procedure: COLONOSCOPY WITH PROPOFOL;  Surgeon: Dolores Frame, MD;  Location: [facility];  Service: Gastroenterology;  Laterality: N/A;  145   ECTOPIC PREGNANCY SURGERY     ESOPHAGOGASTRODUODENOSCOPY (EGD) WITH PROPOFOL N/A 04/29/2022  Procedure: ESOPHAGOGASTRODUODENOSCOPY (EGD) WITH PROPOFOL;  Surgeon: Dolores Frame, MD;  Location: [facility];  Service: Gastroenterology;  Laterality: N/A;   SHOULDER SURGERY Right    rotator cuff repair- fell 6 months later and had to have 2nd shoulder surgery   SHOULDER SURGERY Right    2nd surgery due to injury 6 months after 1st shoulder surgery   TUBAL LIGATION     ULNAR NERVE TRANSPOSITION Right 12/16/2022   Procedure: ULNAR NERVE RELEASE AT ELBOW;  Surgeon: Lisbeth Renshaw, MD;  Location: [facility] OR;  Service: Neurosurgery;  Laterality: Right;    MEDICATIONS:  acetaminophen (TYLENOL) 650 MG CR tablet   albuterol (VENTOLIN HFA) 108 (90 Base) MCG/ACT inhaler   APPLE CIDER VINEGAR PO   ARIPiprazole (ABILIFY) 5 MG tablet   atorvastatin (LIPITOR) 10 MG tablet   cetirizine (ZYRTEC) 10 MG tablet   DULoxetine (CYMBALTA) 30 MG capsule   Fezolinetant (VEOZAH) 45 MG TABS   hydrOXYzine (ATARAX) 50 MG tablet   losartan (COZAAR) 50 MG tablet   omeprazole (PRILOSEC) 40 MG capsule   pregabalin (LYRICA) 150 MG capsule   tiZANidine (ZANAFLEX) 2 MG tablet   traZODone (DESYREL) 50 MG tablet   No current facility-administered medications for this encounter.    Shonna Chock, PA-C Surgical Short Stay/Anesthesiology [facility] Phone [phone] [facility] Phone [phone] 02/16/2023 2:00 PM

## 2023-02-16 NOTE — Anesthesia Preprocedure Evaluation (Addendum)
Anesthesia Evaluation  Patient identified by MRN, date of birth, ID band Patient awake    Reviewed: Allergy & Precautions, NPO status , Patient's Chart, lab work & pertinent test results  Airway Mallampati: I  TM Distance: >3 FB Neck ROM: Full    Dental  (+) Poor Dentition, Missing, Edentulous Lower, Dental Advisory Given, Partial Upper   Pulmonary shortness of breath, sleep apnea , COPD,  COPD inhaler, former smoker   Pulmonary exam normal        Cardiovascular hypertension, Pt. on medications (-) CAD and (-) Past MI Normal cardiovascular exam  CT Coronary 02/02/22: IMPRESSION: 1. Coronary calcium score of 0. This was 0 percentile for age and sex matched control. 2. Normal coronary origin with right dominance. 3. No evidence of CAD. CAD-RADS 0. No evidence of CAD (0%). Consider non-atherosclerotic causes of chest pain. - Reviewed by Dr. Tenny Craw, "CT of heart shows no signfiicant coronary artery plaquing Her calcium score is 0      Very good   CT scan does show fatty liver changes   Hepatic steatosis.   Needs to cut out sugars (less than 24 g added sugar per day), cut out alcohol if drinking    Low carb Send to PCP to review"     Echo (limited with Definity) 01/08/22: IMPRESSIONS   1. Limited images with Definity Acoustic windows difficult      OVeralll LVEF is appears to be approximately 50 to 55% with  hypokinesis of the inferolateal/lateral wall (base, mid).   2. Right ventricular systolic function is low normal. The right  ventricular size is normal.  - Per Dr. Tenny Craw, "Echo (lmited) shows overall LVEF low normal to a little down   ONe wall doesn't appear to be moving as ell  But it was a very difficult study given acoustic windows I would reocmm  CT coronary angiogram to evaluate for CAD".      Neuro/Psych  Headaches PSYCHIATRIC DISORDERS Anxiety Depression     Neuromuscular disease    GI/Hepatic ,GERD  Medicated,,(+)      substance abuse  cocaine use  Endo/Other  Hypothyroidism  Obesity   Renal/GU Renal InsufficiencyRenal disease     Musculoskeletal  (+) Arthritis ,  Fibromyalgia -  Abdominal   Peds  Hematology negative hematology ROS (+)   Anesthesia Other Findings   Reproductive/Obstetrics                             Anesthesia Physical Anesthesia Plan  ASA: 3  Anesthesia Plan: General   Post-op Pain Management: Tylenol PO (pre-op)* and Ketamine IV*   Induction: Intravenous  PONV Risk Score and Plan: 4 or greater and Midazolam, Dexamethasone and Ondansetron  Airway Management Planned: Video Laryngoscope Planned and Oral ETT  Additional Equipment:   Intra-op Plan:   Post-operative Plan: Extubation in OR  Informed Consent: I have reviewed the patients History and Physical, chart, labs and discussed the procedure including the risks, benefits and alternatives for the proposed anesthesia with the patient or authorized representative who has indicated his/her understanding and acceptance.     Dental advisory given  Plan Discussed with: Anesthesiologist and CRNA  Anesthesia Plan Comments: (PAT note written 02/16/2023 by Shonna Chock, PA-C.  )       Anesthesia Quick Evaluation

## 2023-02-18 ENCOUNTER — Other Ambulatory Visit: Payer: Self-pay | Admitting: Family Medicine

## 2023-02-18 DIAGNOSIS — K219 Gastro-esophageal reflux disease without esophagitis: Secondary | ICD-10-CM

## 2023-02-23 ENCOUNTER — Ambulatory Visit (HOSPITAL_COMMUNITY)
Admission: RE | Disposition: A | Discharge status: Home / Self Care | Payer: Self-pay | Source: Home / Self Care | Attending: Neurosurgery

## 2023-02-23 ENCOUNTER — Other Ambulatory Visit: Payer: Self-pay

## 2023-02-23 ENCOUNTER — Observation Stay (HOSPITAL_COMMUNITY)
Admission: RE | Admit: 2023-02-23 | Discharge: 2023-02-24 | Disposition: A | Discharge status: Home / Self Care | Payer: MEDICAID | Attending: Neurosurgery | Admitting: Neurosurgery

## 2023-02-23 ENCOUNTER — Inpatient Hospital Stay (HOSPITAL_COMMUNITY): Payer: MEDICAID

## 2023-02-23 ENCOUNTER — Inpatient Hospital Stay (HOSPITAL_BASED_OUTPATIENT_CLINIC_OR_DEPARTMENT_OTHER): Payer: MEDICAID | Admitting: Anesthesiology

## 2023-02-23 ENCOUNTER — Encounter (HOSPITAL_COMMUNITY): Payer: Self-pay | Admitting: Neurosurgery

## 2023-02-23 ENCOUNTER — Inpatient Hospital Stay (HOSPITAL_COMMUNITY): Payer: MEDICAID | Admitting: Vascular Surgery

## 2023-02-23 DIAGNOSIS — M4712 Other spondylosis with myelopathy, cervical region: Secondary | ICD-10-CM | POA: Diagnosis present

## 2023-02-23 DIAGNOSIS — J449 Chronic obstructive pulmonary disease, unspecified: Secondary | ICD-10-CM | POA: Insufficient documentation

## 2023-02-23 DIAGNOSIS — Z79899 Other long term (current) drug therapy: Secondary | ICD-10-CM | POA: Diagnosis not present

## 2023-02-23 DIAGNOSIS — Z87891 Personal history of nicotine dependence: Secondary | ICD-10-CM | POA: Diagnosis not present

## 2023-02-23 DIAGNOSIS — I129 Hypertensive chronic kidney disease with stage 1 through stage 4 chronic kidney disease, or unspecified chronic kidney disease: Secondary | ICD-10-CM

## 2023-02-23 DIAGNOSIS — E039 Hypothyroidism, unspecified: Secondary | ICD-10-CM | POA: Insufficient documentation

## 2023-02-23 DIAGNOSIS — M4802 Spinal stenosis, cervical region: Secondary | ICD-10-CM

## 2023-02-23 DIAGNOSIS — N1831 Chronic kidney disease, stage 3a: Secondary | ICD-10-CM | POA: Diagnosis not present

## 2023-02-23 DIAGNOSIS — N183 Chronic kidney disease, stage 3 unspecified: Secondary | ICD-10-CM | POA: Diagnosis not present

## 2023-02-23 HISTORY — PX: ANTERIOR CERVICAL DECOMP/DISCECTOMY FUSION: SHX1161

## 2023-02-23 LAB — ABO/RH: ABO/RH(D): A POS

## 2023-02-23 SURGERY — ANTERIOR CERVICAL DECOMPRESSION/DISCECTOMY FUSION 3 LEVELS
Anesthesia: General | Site: Spine Cervical

## 2023-02-23 MED ORDER — SODIUM CHLORIDE 0.9 % IV SOLN: INTRAVENOUS | Status: DC

## 2023-02-23 MED ORDER — MENTHOL 3 MG MT LOZG: 1.0000 | LOZENGE | OROMUCOSAL | Status: DC | PRN

## 2023-02-23 MED ORDER — SENNA 8.6 MG PO TABS
1.0000 | ORAL_TABLET | Freq: Two times a day (BID) | ORAL | Status: DC
  Administered 2023-02-23 – 2023-02-24 (×2): 8.6 mg via ORAL
  Filled 2023-02-23 (×2): qty 1

## 2023-02-23 MED ORDER — LIDOCAINE-EPINEPHRINE 1 %-1:100000 IJ SOLN
INTRAMUSCULAR | Status: DC | PRN
  Administered 2023-02-23: 5 mL

## 2023-02-23 MED ORDER — FEZOLINETANT 45 MG PO TABS: 1.0000 | ORAL_TABLET | Freq: Every day | ORAL | Status: DC

## 2023-02-23 MED ORDER — PHENYLEPHRINE HCL-NACL 20-0.9 MG/250ML-% IV SOLN
INTRAVENOUS | Status: DC | PRN
  Administered 2023-02-23: 50 ug/min via INTRAVENOUS

## 2023-02-23 MED ORDER — OXYCODONE HCL 5 MG PO TABS
10.0000 mg | ORAL_TABLET | ORAL | Status: DC | PRN
  Administered 2023-02-23 – 2023-02-24 (×3): 10 mg via ORAL
  Filled 2023-02-23 (×3): qty 2

## 2023-02-23 MED ORDER — ONDANSETRON HCL 4 MG/2ML IJ SOLN: 4.0000 mg | Freq: Four times a day (QID) | INTRAMUSCULAR | Status: DC | PRN

## 2023-02-23 MED ORDER — CHLORHEXIDINE GLUCONATE CLOTH 2 % EX PADS: 6.0000 | MEDICATED_PAD | Freq: Once | CUTANEOUS | Status: DC

## 2023-02-23 MED ORDER — CEFAZOLIN SODIUM-DEXTROSE 2-4 GM/100ML-% IV SOLN
2.0000 g | Freq: Three times a day (TID) | INTRAVENOUS | Status: AC
  Administered 2023-02-23 – 2023-02-24 (×2): 2 g via INTRAVENOUS
  Filled 2023-02-23 (×2): qty 100

## 2023-02-23 MED ORDER — ACETAMINOPHEN 10 MG/ML IV SOLN: 1000.0000 mg | Freq: Once | INTRAVENOUS | Status: DC | PRN

## 2023-02-23 MED ORDER — PREGABALIN 75 MG PO CAPS
150.0000 mg | ORAL_CAPSULE | Freq: Two times a day (BID) | ORAL | Status: DC
  Administered 2023-02-23 – 2023-02-24 (×2): 150 mg via ORAL
  Filled 2023-02-23 (×2): qty 2

## 2023-02-23 MED ORDER — ONDANSETRON HCL 4 MG/2ML IJ SOLN
INTRAMUSCULAR | Status: AC
  Filled 2023-02-23: qty 6

## 2023-02-23 MED ORDER — PHENYLEPHRINE 80 MCG/ML (10ML) SYRINGE FOR IV PUSH (FOR BLOOD PRESSURE SUPPORT)
PREFILLED_SYRINGE | INTRAVENOUS | Status: DC | PRN
  Administered 2023-02-23: 80 ug via INTRAVENOUS

## 2023-02-23 MED ORDER — THROMBIN 5000 UNITS EX SOLR
CUTANEOUS | Status: AC
  Filled 2023-02-23: qty 5000

## 2023-02-23 MED ORDER — FLEET ENEMA 7-19 GM/118ML RE ENEM: 1.0000 | ENEMA | Freq: Once | RECTAL | Status: DC | PRN

## 2023-02-23 MED ORDER — FENTANYL CITRATE (PF) 100 MCG/2ML IJ SOLN
INTRAMUSCULAR | Status: AC
  Filled 2023-02-23: qty 2

## 2023-02-23 MED ORDER — SUCCINYLCHOLINE CHLORIDE 200 MG/10ML IV SOSY
PREFILLED_SYRINGE | INTRAVENOUS | Status: AC
  Filled 2023-02-23: qty 10

## 2023-02-23 MED ORDER — FENTANYL CITRATE (PF) 250 MCG/5ML IJ SOLN
INTRAMUSCULAR | Status: DC | PRN
  Administered 2023-02-23 (×2): 50 ug via INTRAVENOUS
  Administered 2023-02-23: 100 ug via INTRAVENOUS
  Administered 2023-02-23: 50 ug via INTRAVENOUS

## 2023-02-23 MED ORDER — SODIUM CHLORIDE 0.9% FLUSH: 3.0000 mL | INTRAVENOUS | Status: DC | PRN

## 2023-02-23 MED ORDER — DULOXETINE HCL 30 MG PO CPEP
30.0000 mg | ORAL_CAPSULE | Freq: Two times a day (BID) | ORAL | Status: DC
  Administered 2023-02-23 – 2023-02-24 (×2): 30 mg via ORAL
  Filled 2023-02-23 (×2): qty 1

## 2023-02-23 MED ORDER — BISACODYL 10 MG RE SUPP: 10.0000 mg | Freq: Every day | RECTAL | Status: DC | PRN

## 2023-02-23 MED ORDER — ACETAMINOPHEN 650 MG RE SUPP: 650.0000 mg | RECTAL | Status: DC | PRN

## 2023-02-23 MED ORDER — BUPIVACAINE HCL 0.5 % IJ SOLN
INTRAMUSCULAR | Status: DC | PRN
  Administered 2023-02-23: 5 mL

## 2023-02-23 MED ORDER — ALBUTEROL SULFATE HFA 108 (90 BASE) MCG/ACT IN AERS: 2.0000 | INHALATION_SPRAY | Freq: Every day | RESPIRATORY_TRACT | Status: DC | PRN

## 2023-02-23 MED ORDER — ONDANSETRON HCL 4 MG PO TABS: 4.0000 mg | ORAL_TABLET | Freq: Four times a day (QID) | ORAL | Status: DC | PRN

## 2023-02-23 MED ORDER — ROCURONIUM BROMIDE 10 MG/ML (PF) SYRINGE
PREFILLED_SYRINGE | INTRAVENOUS | Status: DC | PRN
  Administered 2023-02-23: 25 mg via INTRAVENOUS
  Administered 2023-02-23: 100 mg via INTRAVENOUS

## 2023-02-23 MED ORDER — LIDOCAINE 2% (20 MG/ML) 5 ML SYRINGE
INTRAMUSCULAR | Status: DC | PRN
  Administered 2023-02-23: 100 mg via INTRAVENOUS

## 2023-02-23 MED ORDER — TRAZODONE HCL 50 MG PO TABS
50.0000 mg | ORAL_TABLET | Freq: Every day | ORAL | Status: DC
  Administered 2023-02-23: 50 mg via ORAL
  Filled 2023-02-23: qty 1

## 2023-02-23 MED ORDER — PANTOPRAZOLE SODIUM 40 MG PO TBEC
40.0000 mg | DELAYED_RELEASE_TABLET | Freq: Every day | ORAL | Status: DC
  Administered 2023-02-24: 40 mg via ORAL
  Filled 2023-02-23 (×2): qty 1

## 2023-02-23 MED ORDER — ROCURONIUM BROMIDE 10 MG/ML (PF) SYRINGE
PREFILLED_SYRINGE | INTRAVENOUS | Status: AC
  Filled 2023-02-23: qty 10

## 2023-02-23 MED ORDER — CYCLOBENZAPRINE HCL 10 MG PO TABS
10.0000 mg | ORAL_TABLET | Freq: Three times a day (TID) | ORAL | Status: DC | PRN
  Filled 2023-02-23 (×2): qty 1

## 2023-02-23 MED ORDER — THROMBIN 20000 UNITS EX SOLR: CUTANEOUS | Status: DC | PRN

## 2023-02-23 MED ORDER — ONDANSETRON HCL 4 MG/2ML IJ SOLN
INTRAMUSCULAR | Status: DC | PRN
  Administered 2023-02-23: 4 mg via INTRAVENOUS

## 2023-02-23 MED ORDER — DEXAMETHASONE SODIUM PHOSPHATE 10 MG/ML IJ SOLN
INTRAMUSCULAR | Status: DC | PRN
  Administered 2023-02-23: 10 mg via INTRAVENOUS

## 2023-02-23 MED ORDER — DOCUSATE SODIUM 100 MG PO CAPS
100.0000 mg | ORAL_CAPSULE | Freq: Two times a day (BID) | ORAL | Status: DC
  Administered 2023-02-23 – 2023-02-24 (×2): 100 mg via ORAL
  Filled 2023-02-23 (×2): qty 1

## 2023-02-23 MED ORDER — BUPIVACAINE-EPINEPHRINE (PF) 0.25% -1:200000 IJ SOLN
INTRAMUSCULAR | Status: AC
  Filled 2023-02-23: qty 30

## 2023-02-23 MED ORDER — LIDOCAINE 2% (20 MG/ML) 5 ML SYRINGE
INTRAMUSCULAR | Status: AC
  Filled 2023-02-23: qty 10

## 2023-02-23 MED ORDER — ACETAMINOPHEN 500 MG PO TABS
1000.0000 mg | ORAL_TABLET | Freq: Once | ORAL | Status: AC
  Administered 2023-02-23: 1000 mg via ORAL
  Filled 2023-02-23: qty 2

## 2023-02-23 MED ORDER — AMISULPRIDE (ANTIEMETIC) 5 MG/2ML IV SOLN: 10.0000 mg | Freq: Once | INTRAVENOUS | Status: DC | PRN

## 2023-02-23 MED ORDER — OXYCODONE HCL 5 MG/5ML PO SOLN: 5.0000 mg | Freq: Once | ORAL | Status: DC | PRN

## 2023-02-23 MED ORDER — SUGAMMADEX SODIUM 200 MG/2ML IV SOLN
INTRAVENOUS | Status: DC | PRN
  Administered 2023-02-23: 400 mg via INTRAVENOUS

## 2023-02-23 MED ORDER — BUPIVACAINE HCL (PF) 0.5 % IJ SOLN
INTRAMUSCULAR | Status: AC
  Filled 2023-02-23: qty 30

## 2023-02-23 MED ORDER — FENTANYL CITRATE (PF) 250 MCG/5ML IJ SOLN
INTRAMUSCULAR | Status: AC
  Filled 2023-02-23: qty 5

## 2023-02-23 MED ORDER — ROCURONIUM BROMIDE 10 MG/ML (PF) SYRINGE
PREFILLED_SYRINGE | INTRAVENOUS | Status: AC
  Filled 2023-02-23: qty 20

## 2023-02-23 MED ORDER — PROPOFOL 10 MG/ML IV BOLUS
INTRAVENOUS | Status: AC
  Filled 2023-02-23: qty 20

## 2023-02-23 MED ORDER — DEXAMETHASONE SODIUM PHOSPHATE 10 MG/ML IJ SOLN
INTRAMUSCULAR | Status: AC
  Filled 2023-02-23: qty 3

## 2023-02-23 MED ORDER — OXYCODONE HCL 5 MG PO TABS: 5.0000 mg | ORAL_TABLET | Freq: Once | ORAL | Status: DC | PRN

## 2023-02-23 MED ORDER — FENTANYL CITRATE (PF) 100 MCG/2ML IJ SOLN
25.0000 ug | INTRAMUSCULAR | Status: DC | PRN
  Administered 2023-02-23: 50 ug via INTRAVENOUS
  Administered 2023-02-23 (×2): 25 ug via INTRAVENOUS

## 2023-02-23 MED ORDER — OXYCODONE HCL 5 MG PO TABS: 5.0000 mg | ORAL_TABLET | ORAL | Status: DC | PRN

## 2023-02-23 MED ORDER — SODIUM CHLORIDE 0.9 % IV SOLN: 250.0000 mL | INTRAVENOUS | Status: DC

## 2023-02-23 MED ORDER — POLYETHYLENE GLYCOL 3350 17 G PO PACK: 17.0000 g | PACK | Freq: Every day | ORAL | Status: DC | PRN

## 2023-02-23 MED ORDER — SODIUM CHLORIDE 0.9% FLUSH
3.0000 mL | Freq: Two times a day (BID) | INTRAVENOUS | Status: DC
  Administered 2023-02-23 – 2023-02-24 (×2): 3 mL via INTRAVENOUS

## 2023-02-23 MED ORDER — THROMBIN 5000 UNITS EX SOLR: OROMUCOSAL | Status: DC | PRN

## 2023-02-23 MED ORDER — DEXMEDETOMIDINE HCL IN NACL 80 MCG/20ML IV SOLN
INTRAVENOUS | Status: AC
  Filled 2023-02-23: qty 20

## 2023-02-23 MED ORDER — PROPOFOL 10 MG/ML IV BOLUS
INTRAVENOUS | Status: DC | PRN
  Administered 2023-02-23: 30 mg via INTRAVENOUS
  Administered 2023-02-23: 150 mg via INTRAVENOUS

## 2023-02-23 MED ORDER — MORPHINE SULFATE (PF) 2 MG/ML IV SOLN
2.0000 mg | INTRAVENOUS | Status: DC | PRN
  Administered 2023-02-23 – 2023-02-24 (×4): 2 mg via INTRAVENOUS
  Filled 2023-02-23 (×4): qty 1

## 2023-02-23 MED ORDER — ORAL CARE MOUTH RINSE: 15.0000 mL | Freq: Once | OROMUCOSAL | Status: AC

## 2023-02-23 MED ORDER — THROMBIN 20000 UNITS EX SOLR
CUTANEOUS | Status: AC
  Filled 2023-02-23: qty 20000

## 2023-02-23 MED ORDER — MIDAZOLAM HCL 2 MG/2ML IJ SOLN
INTRAMUSCULAR | Status: AC
  Filled 2023-02-23: qty 2

## 2023-02-23 MED ORDER — PHENYLEPHRINE 80 MCG/ML (10ML) SYRINGE FOR IV PUSH (FOR BLOOD PRESSURE SUPPORT)
PREFILLED_SYRINGE | INTRAVENOUS | Status: AC
  Filled 2023-02-23: qty 20

## 2023-02-23 MED ORDER — ARIPIPRAZOLE 5 MG PO TABS
5.0000 mg | ORAL_TABLET | Freq: Every day | ORAL | Status: DC
  Administered 2023-02-24: 5 mg via ORAL
  Filled 2023-02-23 (×2): qty 1

## 2023-02-23 MED ORDER — EPHEDRINE SULFATE-NACL 50-0.9 MG/10ML-% IV SOSY
PREFILLED_SYRINGE | INTRAVENOUS | Status: DC | PRN
  Administered 2023-02-23: 5 mg via INTRAVENOUS
  Administered 2023-02-23: 10 mg via INTRAVENOUS

## 2023-02-23 MED ORDER — LIDOCAINE-EPINEPHRINE 1 %-1:100000 IJ SOLN
INTRAMUSCULAR | Status: AC
  Filled 2023-02-23: qty 1

## 2023-02-23 MED ORDER — 0.9 % SODIUM CHLORIDE (POUR BTL) OPTIME
TOPICAL | Status: DC | PRN
  Administered 2023-02-23: 1000 mL

## 2023-02-23 MED ORDER — EPHEDRINE 5 MG/ML INJ
INTRAVENOUS | Status: AC
  Filled 2023-02-23: qty 5

## 2023-02-23 MED ORDER — CHLORHEXIDINE GLUCONATE 0.12 % MT SOLN
15.0000 mL | Freq: Once | OROMUCOSAL | Status: AC
  Administered 2023-02-23: 15 mL via OROMUCOSAL
  Filled 2023-02-23: qty 15

## 2023-02-23 MED ORDER — LACTATED RINGERS IV SOLN: INTRAVENOUS | Status: DC

## 2023-02-23 MED ORDER — CEFAZOLIN SODIUM-DEXTROSE 2-4 GM/100ML-% IV SOLN
2.0000 g | INTRAVENOUS | Status: AC
  Administered 2023-02-23: 2 g via INTRAVENOUS
  Filled 2023-02-23: qty 100

## 2023-02-23 MED ORDER — DEXMEDETOMIDINE HCL IN NACL 80 MCG/20ML IV SOLN
INTRAVENOUS | Status: DC | PRN
  Administered 2023-02-23 (×2): 8 ug via INTRAVENOUS
  Administered 2023-02-23: 4 ug via INTRAVENOUS

## 2023-02-23 MED ORDER — ACETAMINOPHEN 325 MG PO TABS: 650.0000 mg | ORAL_TABLET | ORAL | Status: DC | PRN

## 2023-02-23 MED ORDER — LOSARTAN POTASSIUM 50 MG PO TABS
50.0000 mg | ORAL_TABLET | Freq: Every day | ORAL | Status: DC
  Administered 2023-02-24: 50 mg via ORAL
  Filled 2023-02-23: qty 1

## 2023-02-23 MED ORDER — PHENOL 1.4 % MT LIQD
1.0000 | OROMUCOSAL | Status: DC | PRN
  Administered 2023-02-24: 1 via OROMUCOSAL
  Filled 2023-02-23: qty 177

## 2023-02-23 MED ORDER — MIDAZOLAM HCL 5 MG/5ML IJ SOLN
INTRAMUSCULAR | Status: DC | PRN
  Administered 2023-02-23: 2 mg via INTRAVENOUS

## 2023-02-23 SURGICAL SUPPLY — 60 items
ADH SKN CLS APL DERMABOND .7 (GAUZE/BANDAGES/DRESSINGS) ×1
APL SKNCLS STERI-STRIP NONHPOA (GAUZE/BANDAGES/DRESSINGS)
BAG COUNTER SPONGE SURGICOUNT (BAG) ×1 IMPLANT
BAG SPNG CNTER NS LX DISP (BAG) ×1
BENZOIN TINCTURE PRP APPL 2/3 (GAUZE/BANDAGES/DRESSINGS) IMPLANT
BLADE CLIPPER SURG (BLADE) IMPLANT
BLADE SURG 11 STRL SS (BLADE) ×1 IMPLANT
BLADE ULTRA TIP 2M (BLADE) IMPLANT
BUR MATCHSTICK NEURO 3.0 LAGG (BURR) ×1 IMPLANT
CAGE CERV NL MED 5 6D (Cage) IMPLANT
CANISTER SUCT 3000ML PPV (MISCELLANEOUS) ×1 IMPLANT
DERMABOND ADVANCED .7 DNX12 (GAUZE/BANDAGES/DRESSINGS) ×1 IMPLANT
DEVICE ENDSKLTN IMPLNT MED 6MM (Orthopedic Implant) IMPLANT
DEVICE ENDSKLTN TC NANOLCK 6MM (Cage) IMPLANT
DRAIN CHANNEL 10M FLAT 3/4 FLT (DRAIN) IMPLANT
DRAPE C-ARM 42X72 X-RAY (DRAPES) ×2 IMPLANT
DRAPE HALF SHEET 40X57 (DRAPES) IMPLANT
DRAPE LAPAROTOMY 100X72 PEDS (DRAPES) ×1 IMPLANT
DRAPE MICROSCOPE SLANT 54X150 (MISCELLANEOUS) ×1 IMPLANT
DRSG OPSITE POSTOP 3X4 (GAUZE/BANDAGES/DRESSINGS) ×2 IMPLANT
DRSG OPSITE POSTOP 4X6 (GAUZE/BANDAGES/DRESSINGS) IMPLANT
DURAPREP 6ML APPLICATOR 50/CS (WOUND CARE) ×1 IMPLANT
ELECT COATED BLADE 2.86 ST (ELECTRODE) ×1 IMPLANT
ENDOSKELETON IMPLANT MED 6MM (Orthopedic Implant) ×1 IMPLANT
ENDOSKELETON TC NANOLOCK 6MM (Cage) ×1 IMPLANT
EVACUATOR SILICONE 100CC (DRAIN) IMPLANT
GAUZE 4X4 16PLY ~~LOC~~+RFID DBL (SPONGE) IMPLANT
GLOVE BIOGEL PI IND STRL 7.5 (GLOVE) ×1 IMPLANT
GLOVE ECLIPSE 7.0 STRL STRAW (GLOVE) ×2 IMPLANT
GLOVE EXAM NITRILE XL STR (GLOVE) IMPLANT
GOWN STRL REUS W/ TWL LRG LVL3 (GOWN DISPOSABLE) ×2 IMPLANT
GOWN STRL REUS W/ TWL XL LVL3 (GOWN DISPOSABLE) IMPLANT
GOWN STRL REUS W/TWL 2XL LVL3 (GOWN DISPOSABLE) IMPLANT
GOWN STRL REUS W/TWL LRG LVL3 (GOWN DISPOSABLE) ×2
GOWN STRL REUS W/TWL XL LVL3 (GOWN DISPOSABLE)
HEMOSTAT POWDER KIT SURGIFOAM (HEMOSTASIS) ×1 IMPLANT
KIT BASIN OR (CUSTOM PROCEDURE TRAY) ×1 IMPLANT
KIT TURNOVER KIT B (KITS) ×1 IMPLANT
NDL HYPO 22X1.5 SAFETY MO (MISCELLANEOUS) ×1 IMPLANT
NDL SPNL 22GX3.5 QUINCKE BK (NEEDLE) ×1 IMPLANT
NEEDLE HYPO 22X1.5 SAFETY MO (MISCELLANEOUS) ×1 IMPLANT
NEEDLE SPNL 22GX3.5 QUINCKE BK (NEEDLE) ×1 IMPLANT
NS IRRIG 1000ML POUR BTL (IV SOLUTION) ×1 IMPLANT
PACK LAMINECTOMY NEURO (CUSTOM PROCEDURE TRAY) ×1 IMPLANT
PAD ARMBOARD 7.5X6 YLW CONV (MISCELLANEOUS) ×3 IMPLANT
PLATE ZEVO 1LVL 17MM (Plate) IMPLANT
PUTTY DBF 1CC CORTICAL FIBERS (Putty) IMPLANT
SCREW 3.5 SELFDRILL 15MM VARI (Screw) IMPLANT
SOL ELECTROSURG ANTI STICK (MISCELLANEOUS)
SOLUTION ELECTROSURG ANTI STCK (MISCELLANEOUS) ×1 IMPLANT
SPIKE FLUID TRANSFER (MISCELLANEOUS) ×2 IMPLANT
SPONGE INTESTINAL PEANUT (DISPOSABLE) ×1 IMPLANT
SPONGE SURGIFOAM ABS GEL 100 (HEMOSTASIS) ×1 IMPLANT
STRIP CLOSURE SKIN 1/2X4 (GAUZE/BANDAGES/DRESSINGS) IMPLANT
SUT VIC AB 3-0 SH 8-18 (SUTURE) ×1 IMPLANT
SUT VICRYL 3-0 RB1 18 ABS (SUTURE) ×1 IMPLANT
TAPE CLOTH 3X10 TAN LF (GAUZE/BANDAGES/DRESSINGS) ×1 IMPLANT
TOWEL GREEN STERILE (TOWEL DISPOSABLE) ×1 IMPLANT
TOWEL GREEN STERILE FF (TOWEL DISPOSABLE) ×1 IMPLANT
WATER STERILE IRR 1000ML POUR (IV SOLUTION) ×1 IMPLANT

## 2023-02-23 NOTE — Anesthesia Procedure Notes (Signed)
Procedure Name: Intubation Date/Time: 02/23/2023 12:32 PM  Performed by: Rachel Moulds, CRNAPre-anesthesia Checklist: Emergency Drugs available, Patient identified, Suction available, Patient being monitored and Timeout performed Patient Re-evaluated:Patient Re-evaluated prior to induction Oxygen Delivery Method: Circle system utilized Preoxygenation: Pre-oxygenation with 100% oxygen Induction Type: IV induction Ventilation: Mask ventilation without difficulty Grade View: Grade I Tube type: Oral Tube size: 7.0 mm Number of attempts: 1 Airway Equipment and Method: Video-laryngoscopy and Rigid stylet Placement Confirmation: ETT inserted through vocal cords under direct vision, positive ETCO2, CO2 detector and breath sounds checked- equal and bilateral Secured at: 20 cm Tube secured with: Tape Dental Injury: Teeth and Oropharynx as per pre-operative assessment

## 2023-02-23 NOTE — Transfer of Care (Signed)
Immediate Anesthesia Transfer of Care Note  Patient: Briana Wyatt  Procedure(s) Performed: Anterior Cervical Decompression/Discectomy Fusion, Cervical Four-Cervical Five, Cervical Five-Cervical Six, Cervical Six-Cervical Cervical Cervical Seven (Spine Cervical)  Patient Location: PACU  Anesthesia Type:General  Level of Consciousness: awake and patient cooperative  Airway & Oxygen Therapy: Patient Spontanous Breathing and Patient connected to face mask oxygen  Post-op Assessment: Report given to RN and Post -op Vital signs reviewed and stable  Post vital signs: Reviewed and stable  Last Vitals:  Vitals Value Taken Time  BP    Temp    Pulse 107 02/23/23 1550  Resp 15 02/23/23 1550  SpO2 95 % 02/23/23 1550  Vitals shown include unvalidated device data.  Last Pain:  Vitals:   02/23/23 1031  TempSrc:   PainSc: 9       Patients Stated Pain Goal: 3 (02/23/23 1031)  Complications: No notable events documented.

## 2023-02-23 NOTE — Progress Notes (Signed)
Orthopedic Tech Progress Note Patient Details:  Briana Wyatt 1970-12-03 [id number]  PACU RN called requesting an [employer] COLLAR for patient so I called in order stat to [facility]  Patient ID: Briana Wyatt, female   DOB: 22-Feb-1971, 52 y.o.   MRN: [id number]  Briana Wyatt 02/23/2023, 4:23 PM

## 2023-02-23 NOTE — H&P (Signed)
Chief Complaint   No chief complaint on file.   History of Present Illness  Mrs. Briana Wyatt is a 52 year old woman I am seeing in follow-up.  She has undergone right ulnar nerve release at the elbow a few months ago.  She continues to complain of some right-sided arm and hand paresthesias.  She has also complained of chronic neck pain.  Her MRI has revealed significant cervical stenosis and she presents today for surgical decompression.  Of note, the patient reports a history of hypertension and chronic kidney disease.  She does not report any history of heart attack or stroke.  No known lung or liver problems.  She is not on any blood thinners or antiplatelet agents.  No cancer history.  She is a previous smoker having quit decades ago.  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   Depression    major   Dyspnea    Fatty liver    Fibromyalgia    Headache    Hypertension    Hypothyroidism    Kidney disease, chronic, stage III (moderate, EGFR 30-59 ml/min) (HCC)    Movement disorder    Pre-diabetes    PTSD (post-traumatic stress disorder)    Restless leg syndrome    Sciatica of right side    Sleep apnea    mild, pt states she is awaiting insurance approval for CPAP   Spondylosis    Cervical and lumbar   Substance abuse (HCC)    Hx Crack/cocaine (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)   Tortuous colon     Past Surgical History   Past Surgical History:  Procedure Laterality Date   BIOPSY  04/29/2022   Procedure: BIOPSY;  Surgeon: Dolores Frame, MD;  [facility]: [facility];  Service: Gastroenterology;;   Emiliano Dyer TUNNEL RELEASE Bilateral    COLONOSCOPY WITH PROPOFOL N/A 04/29/2022   Procedure: COLONOSCOPY WITH PROPOFOL;  Surgeon: Dolores Frame, MD;  Location: [facility];  Service: Gastroenterology;  Laterality: N/A;  145   ECTOPIC PREGNANCY SURGERY     ESOPHAGOGASTRODUODENOSCOPY (EGD) WITH PROPOFOL N/A 04/29/2022   Procedure: ESOPHAGOGASTRODUODENOSCOPY (EGD) WITH PROPOFOL;  Surgeon: Dolores Frame, MD;  Location: [facility];  Service: Gastroenterology;  Laterality: N/A;   SHOULDER SURGERY Right    rotator cuff repair- fell 6 months later and had to have 2nd shoulder surgery   SHOULDER SURGERY Right    2nd surgery due to injury 6 months after 1st shoulder surgery   TUBAL LIGATION     ULNAR NERVE TRANSPOSITION Right 12/16/2022   Procedure: ULNAR NERVE RELEASE AT ELBOW;  Surgeon: Lisbeth Renshaw, MD;  Location: [facility] OR;  Service: Neurosurgery;  Laterality: Right;    Social History   Social History   Tobacco Use   Smoking status: Former    Types: Cigarettes    Quit date: 08/25/1995    Years since quitting: 27.5    Passive exposure: Past   Smokeless tobacco: Never  Vaping Use   Vaping Use: Never used  Substance Use Topics   Alcohol use: No   Drug use: Yes    Types: "Crack" cocaine    Comment: last used 08/2022    Medications   Prior to Admission medications  Medication Sig Start Date End Date Taking? Authorizing Provider  acetaminophen (TYLENOL) 650 MG CR tablet Take 650 mg by mouth every 8 (eight) hours as needed for pain or fever. 03/02/19  Yes [provider]  albuterol (VENTOLIN HFA) 108 (90 Base) MCG/ACT inhaler Inhale 2 puffs into the lungs daily as needed for wheezing or shortness of breath (COPD).   Yes [provider]  APPLE CIDER VINEGAR PO Take 2 capsules by mouth daily.   Yes [provider]  ARIPiprazole (ABILIFY) 5 MG tablet Take 5 mg by mouth daily.   Yes [provider]  atorvastatin (LIPITOR) 10 MG tablet Take 10 mg by mouth daily. 02/08/23  Yes [provider]  cetirizine (ZYRTEC) 10 MG tablet Take 10 mg by mouth daily.   Yes [provider]  DULoxetine (CYMBALTA) 30 MG capsule Take 1 capsule (30 mg total) by mouth 2 (two) times daily. 03/16/22 02/23/23 Yes Massengill, Harrold Donath, MD  Fezolinetant (VEOZAH) 45 MG TABS Take 1 tablet (45 mg total) by mouth at bedtime. 11/06/22  Yes Lazaro Arms, MD  hydrOXYzine (ATARAX) 50 MG tablet Take 50 mg by mouth 2 (two) times daily as needed for anxiety.   Yes [provider]  losartan (COZAAR) 50 MG tablet TAKE 1 Tablet BY MOUTH ONCE EVERY DAY 09/18/22  Yes Gilmore Laroche, FNP  omeprazole (PRILOSEC) 40 MG capsule TAKE 1 Capsule BY MOUTH ONCE EVERY DAY 08/03/22  Yes Gilmore Laroche, FNP  pregabalin (LYRICA) 150 MG capsule Take 150 mg by mouth 2 (two) times daily. 11/30/22  Yes [provider]  tiZANidine (ZANAFLEX) 2 MG tablet Take 2 mg by mouth every 6 (six) hours as needed for muscle spasms. 02/02/22  Yes [provider]  traZODone (DESYREL) 50 MG tablet Take 50 mg by mouth at bedtime.   Yes [provider]    Allergies   Allergies  Allergen Reactions   Codeine Nausea And Vomiting    High doses of codeine pt reports greater than 30mg  she can't tolerate    Methocarbamol Itching    "feels like bugs are crawling under skin"   Nsaids     Other reaction(s): Contraindicated CKD, only one kidney Other reaction(s): Contraindicated CKD, only one kidney    Review of Systems  ROS  Neurologic Exam  Awake, alert, oriented Memory and concentration grossly intact Speech fluent, appropriate CN grossly intact Motor exam: Upper Extremities Deltoid Bicep Tricep Grip  Right 5/5 5/5 5/5 5/5  Left 5/5 5/5 5/5 5/5   Lower Extremities IP Quad PF DF EHL  Right 5/5 5/5 5/5 5/5 5/5  Left 5/5 5/5 5/5 5/5 5/5   Sensation grossly intact to LT  Imaging  MRI of cervical spine dated 01/25/2023 was personally reviewed.  This demonstrates straightening of the normal cervical lordosis.  There is broad-based disc osteophyte complex most significant at C5-6 with  associated severe central stenosis, and similar finding at C4-5 and C6-7 with associated moderately severe stenosis.  There is also severe right-sided foraminal stenosis at C5-6 and moderate stenosis at C4-5 and C6-7.  Impression  52 year old woman with chronic neck pain and continued right-sided arm and hand pain and paresthesias.  She is a few months status post ulnar nerve release on the right.  Her cervical imaging does reveal significant cervical spondylosis including severe central stenosis and foraminal stenosis at C4-5 through C6-7.  I therefore think that operative decompression is reasonable.  Plan  - we will proceed with ACDF C4-5 C5-6  C6-7  I have reviewed the indications for the procedure as well as the details of the procedure and the expected postoperative course and recovery at length with the patient in the office. We have also reviewed in detail the risks, benefits, and alternatives to the procedure. All questions were answered and Naema Mantis provided informed consent to proceed.  Lisbeth Renshaw, MD [facility]

## 2023-02-23 NOTE — Anesthesia Postprocedure Evaluation (Signed)
Anesthesia Post Note  Patient: Shaconda Debruin  Procedure(s) Performed: Anterior Cervical Decompression/Discectomy Fusion, Cervical Four-Cervical Five, Cervical Five-Cervical Six, Cervical Six-Cervical Cervical Cervical Seven (Spine Cervical)     Patient location during evaluation: PACU Anesthesia Type: General Level of consciousness: sedated Pain management: pain level controlled Vital Signs Assessment: post-procedure vital signs reviewed and stable Respiratory status: spontaneous breathing and respiratory function stable Cardiovascular status: stable Postop Assessment: no apparent nausea or vomiting Anesthetic complications: no  No notable events documented.  Last Vitals:  Vitals:   02/23/23 1630 02/23/23 1645  BP: 122/76 124/88  Pulse: 82 90  Resp: 14 13  Temp:    SpO2: 98% 96%    Last Pain:  Vitals:   02/23/23 1645  TempSrc:   PainSc: Asleep    LLE Motor Response: Purposeful movement (02/23/23 1645) LLE Sensation: Full sensation (02/23/23 1645) RLE Motor Response: Purposeful movement (02/23/23 1645) RLE Sensation: Full sensation (02/23/23 1645)      Haylo Fake DANIEL

## 2023-02-23 NOTE — Brief Op Note (Signed)
  NEUROSURGERY BRIEF OPERATIVE NOTE   PREOP DX: Cervical stenosis C3 - C7  POSTOP DX: Same  PROCEDURE: ACDF C3-4, C4-5 C6-7  SURGEON: Dr. Lisbeth Renshaw, MD  ASSISTANT: Dr. Hoyt Koch, MD  ANESTHESIA: GETA  EBL: 30cc  SPECIMENS: None  DRAINS: None  COMPLICATIONS: None immediate  CONDITION: Hemodynamically stable to PACU    Lisbeth Renshaw, MD [facility]

## 2023-02-24 ENCOUNTER — Encounter (HOSPITAL_COMMUNITY): Payer: Self-pay | Admitting: Neurosurgery

## 2023-02-24 ENCOUNTER — Other Ambulatory Visit (HOSPITAL_COMMUNITY): Payer: Self-pay

## 2023-02-24 DIAGNOSIS — M4712 Other spondylosis with myelopathy, cervical region: Secondary | ICD-10-CM | POA: Diagnosis not present

## 2023-02-24 MED ORDER — OXYCODONE HCL 10 MG PO TABS: 10.0000 mg | ORAL_TABLET | Freq: Four times a day (QID) | ORAL | 0 refills | Status: AC | PRN

## 2023-02-24 MED FILL — Thrombin For Soln 5000 Unit: CUTANEOUS | Qty: 5000 | Status: AC

## 2023-02-24 NOTE — Progress Notes (Signed)
Reviewed DC with pt. Pt states understanding. Pt has medication from home with her in her personal belongings bag. Med's were never admitted to pharmacy but stayed with the pt. Those re leaving with her with the rest of her belongings. Pt wheeled off unit by this RN

## 2023-02-24 NOTE — Plan of Care (Signed)
  Problem: Activity: Goal: Ability to avoid complications of mobility impairment will improve Outcome: Progressing   

## 2023-02-24 NOTE — TOC Transition Note (Signed)
Transition of Care [facility]) - CM/SW Discharge Note   Patient Details  Name: Briana Wyatt MRN: [id number] Date of Birth: Mar 22, 1971  Transition of [facility]) CM/SW Contact:  Kermit Balo, RN Phone Number: 02/24/2023, 1:34 PM   Clinical Narrative:     Pt is discharging home with self care. No needs per TOC.  Final next level of care: Home/Self Care Barriers to Discharge: No Barriers Identified   Patient Goals and CMS Choice      Discharge Placement                         Discharge Plan and Services Additional resources added to the After Visit Summary for                                       Social Determinants of Health (SDOH) Interventions SDOH Screenings   Food Insecurity: No Food Insecurity (02/23/2023)  Housing: Low Risk  (02/23/2023)  Transportation Needs: No Transportation Needs (02/23/2023)  Utilities: Not At Risk (02/23/2023)  Alcohol Screen: Low Risk  (08/10/2022)  Depression (PHQ2-9): High Risk (08/10/2022)  Financial Resource Strain: Low Risk  (11/16/2022)  Physical Activity: Insufficiently Active (11/16/2022)  Social Connections: Unknown (11/16/2022)  Stress: Stress Concern Present (11/16/2022)  Tobacco Use: Medium Risk (02/24/2023)     Readmission Risk Interventions     No data to display

## 2023-02-24 NOTE — Progress Notes (Signed)
PT Cancellation Note  Patient Details Name: Rennette Toure MRN: [id number] DOB: 1971-03-24   Cancelled Treatment:    Reason Eval/Treat Not Completed: PT screened, no needs identified, will sign off (Pt independent with mobility with no skilled Acute PT needs.)   Wynn Maudlin, DPT Acute Rehabilitation Services Office [phone]  02/24/23 2:36 PM

## 2023-02-24 NOTE — Evaluation (Signed)
Occupational Therapy Evaluation Patient Details Name: Briana Wyatt MRN: [id number] DOB: 16-Apr-1971 Today's Date: 02/24/2023   History of Present Illness Patient is 52 y.o. female s/p ACDF C3-7 on 02/23/23. Patient has history of chronic neck pain and radiating pain and paraesthesia into Rt UE and hand. Imaging revealed significant cervical spondylosis including severe central stenosis and foraminal stenosis at C4-5 through C6-7. PMH significant for OA, barretts esophagus, COPD, depression, fibromyalgia, HTN, hypothyroidism, CKD III, PTSD, Rt LE sciatica, hx of SA.   Clinical Impression   Completed all education regarding cervical precautions for ADL and functional mobility. Handout reviewed.  Pt plans to use her cane on uneven ground as she did prior to surgery. Pt demonstrated understanding of precautions. No further OT needed.      Recommendations for follow up therapy are one component of a multi-disciplinary discharge planning process, led by the attending physician.  Recommendations may be updated based on patient status, additional functional criteria and insurance authorization.   Assistance Recommended at Discharge Intermittent Supervision/Assistance  Patient can return home with the following Assistance with cooking/housework    Functional Status Assessment  Patient has had a recent decline in their functional status and demonstrates the ability to make significant improvements in function in a reasonable and predictable amount of time.  Equipment Recommendations  None recommended by OT    Recommendations for Other Services       Precautions / Restrictions Precautions Precautions: Cervical Precaution Booklet Issued: Yes (comment) Required Braces or Orthoses: Cervical Brace Cervical Brace: Hard collar (OK to have off for showers, when in bed and nighttime trips to the bathroom; also has a soft collar)      Mobility Bed Mobility Overal bed mobility: Modified Independent                   Transfers Overall transfer level: Modified independent    Able to negotiate 2 steps with rail without difficulty                    Balance Overall balance assessment: No apparent balance deficits (not formally assessed)                                         ADL either performed or assessed with clinical judgement   ADL Overall ADL's : Modified independent                                       General ADL Comments: educated on cervical precautions; issued reacher and long handled sponge; educatedon fall precautions; ableto return demonstrate     Vision Baseline Vision/History: 1 Wears glasses       Perception     Praxis      Pertinent Vitals/Pain Pain Assessment Pain Assessment: 0-10 Pain Score: 8  Pain Location: neck Pain Descriptors / Indicators: Discomfort, Grimacing Pain Intervention(s): Limited activity within patient's tolerance, Patient requesting pain meds-RN notified     Hand Dominance Right   Extremity/Trunk Assessment Upper Extremity Assessment Upper Extremity Assessment: Overall WFL for tasks assessed (states RUE is better since surgery)   Lower Extremity Assessment Lower Extremity Assessment: Overall WFL for tasks assessed   Cervical / Trunk Assessment Cervical / Trunk Assessment: Neck Surgery   Communication     Cognition Arousal/Alertness: Awake/alert Behavior During Therapy:  WFL for tasks assessed/performed Overall Cognitive Status: Within Functional Limits for tasks assessed                                       General Comments  educated on donning/doffing hard collar - use clam shell technique    Exercises     Shoulder Instructions      Home Living Family/patient expects to be discharged to:: Private residence Living Arrangements: Parent Available Help at Discharge: Family;Available 24 hours/day Type of Home: House Home Access: Stairs to  enter [employer] of Steps: 3 Entrance Stairs-Rails: Left Home Layout: One level     Bathroom Shower/Tub: Tub/shower unit;Curtain   Bathroom Toilet: Handicapped height Bathroom Accessibility: No   Home Equipment: Shower seat;Grab bars - tub/shower;Cane - single point          Prior Functioning/Environment Prior Level of Function : Independent/Modified Independent;Driving (lives in a camper behind her mom's house; Mom will help out at DC adn she plans to stay with her Mom)                        OT Problem List: Decreased knowledge of use of DME or AE;Decreased knowledge of precautions;Pain      OT Treatment/Interventions:      OT Goals(Current goals can be found in the care plan section) Acute Rehab OT Goals Patient Stated Goal: home today OT Goal Formulation: All assessment and education complete, DC therapy  OT Frequency:      Co-evaluation              AM-PAC OT "6 Clicks" Daily Activity     Outcome Measure Help from another person eating meals?: None Help from another person taking care of personal grooming?: None Help from another person toileting, which includes using toliet, bedpan, or urinal?: None Help from another person bathing (including washing, rinsing, drying)?: None Help from another person to put on and taking off regular upper body clothing?: None Help from another person to put on and taking off regular lower body clothing?: None 6 Click Score: 24   End of Session Equipment Utilized During Treatment: Gait belt Nurse Communication: Patient requests pain meds  Activity Tolerance: Patient tolerated treatment well Patient left: in bed;with call bell/phone within reach  OT Visit Diagnosis: Pain Pain - part of body:  (neck)                Time: 8119-1478 OT Time Calculation (min): 32 min Charges:  OT General Charges $OT Visit: 1 Visit OT Evaluation $OT Eval Low Complexity: 1 Low  Luisa Dago, OT/L   Acute OT Clinical  Specialist Acute Rehabilitation Services Pager [phone] Office [phone]   [facility] 02/24/2023, 2:58 PM

## 2023-02-24 NOTE — Discharge Summary (Signed)
Physician Discharge Summary  Patient ID: Briana Wyatt MRN: [id number] DOB/AGE: [birth year] 54 y.o.  Admit date: 02/23/2023 Discharge date: 02/24/2023  Admission Diagnoses:  Cervical stenosis C4-C7  Discharge Diagnoses:  Same Principal Problem:   Cervical stenosis of spine   Discharged Condition: Stable  Hospital Course:  Briana Wyatt is a 52 y.o. female Admitted after elective ACD F. She was at baseline postoperatively and monitored on the general neuroscience floor.  She was ambulating well, tolerating diet, and voiding normally.  Neck pain is under control with oral medication.  She requested discharge home.  Treatments: Surgery - ACDF C4-C7  Discharge Exam: Blood pressure 118/83, pulse 83, temperature 98 F (36.7 C), temperature source Oral, resp. rate 18, height 5\' 5"  (1.651 m), weight 90.7 kg, last menstrual period 08/25/2019, SpO2 94 %. Awake, alert, oriented Speech fluent, appropriate CN grossly intact 5/5 BUE/BLE Wound c/d/i  Disposition: Discharge disposition: 01-Home or Self Care       Discharge Instructions     Call MD for:  redness, tenderness, or signs of infection (pain, swelling, redness, odor or green/yellow discharge around incision site)   Complete by: As directed    Call MD for:  temperature >100.4   Complete by: As directed    Diet - low sodium heart healthy   Complete by: As directed    Discharge instructions   Complete by: As directed    Walk at home as much as possible, at least 4 times / day   Incentive spirometry RT   Complete by: As directed    Increase activity slowly   Complete by: As directed    Lifting restrictions   Complete by: As directed    No lifting > 10 lbs   May shower / Bathe   Complete by: As directed    48 hours after surgery   May walk up steps   Complete by: As directed    Other Restrictions   Complete by: As directed    No bending/twisting at waist   Remove dressing in 48 hours   Complete by: As  directed       Allergies as of 02/24/2023       Reactions   Codeine Nausea And Vomiting   High doses of codeine pt reports greater than 30mg  she can't tolerate   Methocarbamol Itching   "feels like bugs are crawling under skin"   Nsaids    Other reaction(s): Contraindicated CKD, only one kidney Other reaction(s): Contraindicated CKD, only one kidney        Medication List     TAKE these medications    acetaminophen 650 MG CR tablet Commonly known as: TYLENOL Take 650 mg by mouth every 8 (eight) hours as needed for pain or fever.   albuterol 108 (90 Base) MCG/ACT inhaler Commonly known as: VENTOLIN HFA Inhale 2 puffs into the lungs daily as needed for wheezing or shortness of breath (COPD).   APPLE CIDER VINEGAR PO Take 2 capsules by mouth daily.   ARIPiprazole 5 MG tablet Commonly known as: ABILIFY Take 5 mg by mouth daily.   atorvastatin 10 MG tablet Commonly known as: LIPITOR Take 10 mg by mouth daily.   cetirizine 10 MG tablet Commonly known as: ZYRTEC Take 10 mg by mouth daily.   DULoxetine 30 MG capsule Commonly known as: CYMBALTA Take 1 capsule (30 mg total) by mouth 2 (two) times daily.   hydrOXYzine 50 MG tablet Commonly known as: ATARAX Take 50 mg by mouth 2 (two)  times daily as needed for anxiety.   losartan 50 MG tablet Commonly known as: COZAAR TAKE 1 Tablet BY MOUTH ONCE EVERY DAY   omeprazole 40 MG capsule Commonly known as: PRILOSEC TAKE 1 Capsule BY MOUTH ONCE EVERY DAY   Oxycodone HCl 10 MG Tabs Take 1 tablet (10 mg total) by mouth every 6 (six) hours as needed for up to 7 days for severe pain ((score 7 to 10)).   pregabalin 150 MG capsule Commonly known as: LYRICA Take 150 mg by mouth 2 (two) times daily.   tiZANidine 2 MG tablet Commonly known as: ZANAFLEX Take 2 mg by mouth every 6 (six) hours as needed for muscle spasms.   traZODone 50 MG tablet Commonly known as: DESYREL Take 50 mg by mouth at bedtime.   Veozah 45 MG  Tabs Generic drug: Fezolinetant Take 1 tablet (45 mg total) by mouth at bedtime.        Follow-up Information     Lisbeth Renshaw, MD Follow up.   Specialty: Neurosurgery Contact information: [address] [place] [address] [phone]                 Signed: Jackelyn Hoehn 02/24/2023, 1:23 PM

## 2023-02-25 NOTE — Op Note (Signed)
NEUROSURGERY OPERATIVE NOTE   PREOP DIAGNOSIS: Cervical Spondylosis with myelopathy, C4-5, C5-6, C6-7  POSTOP DIAGNOSIS: Same  PROCEDURE: 1. Discectomy at C4-5, C5-6, C6-7 for decompression of spinal cord and exiting nerve roots  2. Placement of intervertebral biomechanical device, Titan 6mm medium cage @ C5-6, C6-7, 5mm medium @ C4-5 3. Placement of anterior instrumentation consisting of interbody plate and screws - Medtronic Zevo 17mm plate x3, 15mm screws x 12 4. Use of morselized bone allograft  5. Arthrodesis C4-5, C5-6, C6-7, anterior interbody technique  6. Use of intraoperative microscope  SURGEON: Dr. Lisbeth Renshaw, MD  ASSISTANT: Dr. Hoyt Koch, MD  ANESTHESIA: General Endotracheal  EBL: 50cc  SPECIMENS: None  DRAINS: None  COMPLICATIONS: None immediate  CONDITION: Hemodynamically stable to PACU  HISTORY: Briana Wyatt is a 52 y.o. woman initially seen in the outpatient neurosurgery clinic with neck pain and hand paresthesias, and imaging revealing significant cervical stenosis with spinal cord compression.  She therefore presents today for surgical decompression.  The risks, benefits, and alternatives to surgery were all reviewed in detail with the patient in the office.  After all questions were answered informed consent was obtained and witnessed.  PROCEDURE IN DETAIL: The patient was brought to the operating room and transferred to the operative table. After induction of general anesthesia, the patient was positioned on the operative table in the supine position with all pressure points meticulously padded. The skin of the neck was then prepped and draped in the usual sterile fashion.  After timeout was conducted, the skin was infiltrated with local anesthetic. Skin incision was then made sharply and Bovie electrocautery was used to dissect the subcutaneous tissue until the platysma was identified. The platysma was then divided and undermined. The  sternocleidomastoid muscle was then identified and, utilizing natural fascial planes in the neck, the prevertebral fascia was identified and the carotid sheath was retracted laterally and the trachea and esophagus retracted medially. Again using fluoroscopy, the correct disc spaces were identified. Bovie electrocautery was used to dissect in the subperiosteal plane and elevate the bilateral longus coli muscles. Table mounted retractors were then placed. At this point, the microscope was draped and brought into the field, and the remainder of the case was done under the microscope using microdissecting technique.  The C6-7 disc space was incised sharply and rongeurs were use to initially complete a discectomy. The high-speed drill was then used to complete discectomy until the posterior annulus was identified and removed and the posterior longitudinal ligament was identified. Using a nerve hook, the PLL was elevated, and Kerrison rongeurs were used to remove the posterior longitudinal ligament and the ventral thecal sac was identified. Using a combination of curettes and rongeurs, complete decompression of the thecal sac and exiting nerve roots at this level was completed, and verified using micro-nerve hook.  At this point, a 6mm medium width interbody cage was sized and packed with morcellized bone allograft.  Endplates were prepared with a curette.  This was then inserted and tapped into place.  17 mm plate with 15 mm screws was then placed over the interspace and secured.  Attention was then turned to the C5-6 level. In a similar fashion, discectomy was completed initially with curettes and rongeurs, and completed with the drill. The PLL was again identified, elevated and incised. Using Kerrison rongeurs, decompression of the spinal cord and exiting roots was completed.  The C5 and C6 endplates were then prepared with a curette.  Trials were used to select a 6  mm medium with interbody cage which was  packed with bone allograft and tapped into place.  A 17 mm plate was then placed over the C5-6 interspace and secured with 15 mm screws.  Attention was then turned to the C4-5 level. In a similar fashion, discectomy was completed initially with curettes and rongeurs, and completed with the drill. The PLL was again identified, elevated and incised. Using Kerrison rongeurs, decompression of the spinal cord and exiting roots was completed and confirmed with a dissector.  The endplates were prepared with curettes at C4 and C5.  Trials were used to select a 5 mm medium width lordotic cage.  This was packed with morselized bone allograft and tapped into place.  A 17 mm plate was then placed across the C4-5 interspace and secured with 15 mm screws.  Final fluoroscopic images in lateral projection was taken to confirm good hardware placement.  At this point, after all counts were verified to be correct, meticulous hemostasis was secured using a combination of bipolar electrocautery and passive hemostatics. The platysma muscle was then closed using interrupted 3-0 Vicryl sutures, and the skin was closed with a interrupted subcuticular stitch. Sterile dressings were then applied and the drapes removed.  The patient tolerated the procedure well and was extubated in the room and taken to the postanesthesia care unit in stable condition.   Lisbeth Renshaw, MD [facility]

## 2023-03-15 ENCOUNTER — Ambulatory Visit (HOSPITAL_BASED_OUTPATIENT_CLINIC_OR_DEPARTMENT_OTHER): Payer: MEDICAID | Admitting: Family

## 2023-03-18 ENCOUNTER — Other Ambulatory Visit: Payer: Self-pay | Admitting: Family Medicine

## 2023-03-18 DIAGNOSIS — K219 Gastro-esophageal reflux disease without esophagitis: Secondary | ICD-10-CM

## 2023-04-06 ENCOUNTER — Other Ambulatory Visit: Payer: Self-pay | Admitting: Family Medicine

## 2023-04-12 ENCOUNTER — Ambulatory Visit: Payer: MEDICAID | Admitting: Cardiology

## 2023-04-13 NOTE — Progress Notes (Signed)
This encounter was created in error - please disregard.

## 2023-04-13 NOTE — Procedures (Signed)
Erroneous encounter

## 2023-04-14 ENCOUNTER — Other Ambulatory Visit: Payer: Self-pay | Admitting: Family Medicine

## 2023-04-17 ENCOUNTER — Other Ambulatory Visit: Payer: Self-pay | Admitting: Family Medicine

## 2023-04-23 ENCOUNTER — Encounter: Payer: Self-pay | Admitting: Obstetrics & Gynecology

## 2023-05-06 ENCOUNTER — Ambulatory Visit (INDEPENDENT_AMBULATORY_CARE_PROVIDER_SITE_OTHER): Payer: Medicaid Other | Admitting: Gastroenterology

## 2023-05-06 ENCOUNTER — Encounter (INDEPENDENT_AMBULATORY_CARE_PROVIDER_SITE_OTHER): Payer: Self-pay

## 2023-05-13 ENCOUNTER — Other Ambulatory Visit (HOSPITAL_COMMUNITY): Payer: Self-pay | Admitting: Neurosurgery

## 2023-05-13 DIAGNOSIS — M47816 Spondylosis without myelopathy or radiculopathy, lumbar region: Secondary | ICD-10-CM

## 2023-05-19 ENCOUNTER — Ambulatory Visit (HOSPITAL_COMMUNITY)
Admission: RE | Admit: 2023-05-19 | Discharge: 2023-05-19 | Disposition: A | Discharge status: Home / Self Care | Payer: MEDICAID | Source: Ambulatory Visit | Attending: Neurosurgery | Admitting: Neurosurgery

## 2023-05-19 DIAGNOSIS — M47816 Spondylosis without myelopathy or radiculopathy, lumbar region: Secondary | ICD-10-CM | POA: Diagnosis present

## 2023-05-24 ENCOUNTER — Ambulatory Visit (INDEPENDENT_AMBULATORY_CARE_PROVIDER_SITE_OTHER): Payer: Medicaid Other | Admitting: Gastroenterology

## 2023-06-08 ENCOUNTER — Encounter (HOSPITAL_BASED_OUTPATIENT_CLINIC_OR_DEPARTMENT_OTHER): Payer: Self-pay

## 2023-06-08 ENCOUNTER — Ambulatory Visit (HOSPITAL_BASED_OUTPATIENT_CLINIC_OR_DEPARTMENT_OTHER): Payer: MEDICAID | Attending: Cardiology | Admitting: Cardiology

## 2023-07-07 ENCOUNTER — Telehealth: Payer: Self-pay | Admitting: Nurse Practitioner

## 2023-07-07 ENCOUNTER — Other Ambulatory Visit: Payer: Self-pay

## 2023-07-07 DIAGNOSIS — R7989 Other specified abnormal findings of blood chemistry: Secondary | ICD-10-CM

## 2023-07-07 DIAGNOSIS — E041 Nontoxic single thyroid nodule: Secondary | ICD-10-CM

## 2023-07-07 NOTE — Telephone Encounter (Signed)
Please update labs

## 2023-07-07 NOTE — Telephone Encounter (Signed)
ORDER UPDATED

## 2023-07-08 ENCOUNTER — Encounter (HOSPITAL_BASED_OUTPATIENT_CLINIC_OR_DEPARTMENT_OTHER): Payer: Self-pay

## 2023-07-08 ENCOUNTER — Ambulatory Visit (HOSPITAL_BASED_OUTPATIENT_CLINIC_OR_DEPARTMENT_OTHER): Payer: MEDICAID | Attending: Cardiology | Admitting: Cardiology

## 2023-07-13 ENCOUNTER — Ambulatory Visit (HOSPITAL_COMMUNITY): Payer: MEDICAID

## 2023-07-20 ENCOUNTER — Encounter: Payer: Self-pay | Admitting: Nurse Practitioner

## 2023-07-20 ENCOUNTER — Other Ambulatory Visit: Payer: Self-pay | Admitting: Nurse Practitioner

## 2023-07-20 DIAGNOSIS — E041 Nontoxic single thyroid nodule: Secondary | ICD-10-CM

## 2023-07-20 DIAGNOSIS — R7989 Other specified abnormal findings of blood chemistry: Secondary | ICD-10-CM

## 2023-07-21 ENCOUNTER — Ambulatory Visit: Payer: Medicaid Other | Admitting: Nurse Practitioner

## 2023-07-21 DIAGNOSIS — E041 Nontoxic single thyroid nodule: Secondary | ICD-10-CM

## 2023-07-21 DIAGNOSIS — R7989 Other specified abnormal findings of blood chemistry: Secondary | ICD-10-CM

## 2023-07-21 NOTE — Telephone Encounter (Signed)
Patient needs to reschedule.  See message.

## 2023-07-27 ENCOUNTER — Ambulatory Visit (HOSPITAL_COMMUNITY)
Admission: RE | Admit: 2023-07-27 | Discharge: 2023-07-27 | Disposition: A | Discharge status: Home / Self Care | Payer: MEDICAID | Source: Ambulatory Visit | Attending: Nurse Practitioner | Admitting: Nurse Practitioner

## 2023-07-27 DIAGNOSIS — R7989 Other specified abnormal findings of blood chemistry: Secondary | ICD-10-CM | POA: Insufficient documentation

## 2023-07-27 DIAGNOSIS — E041 Nontoxic single thyroid nodule: Secondary | ICD-10-CM | POA: Diagnosis present

## 2023-08-02 ENCOUNTER — Ambulatory Visit (HOSPITAL_COMMUNITY): Payer: MEDICAID

## 2023-08-06 ENCOUNTER — Ambulatory Visit: Payer: MEDICAID | Admitting: Nurse Practitioner

## 2023-09-15 ENCOUNTER — Other Ambulatory Visit: Payer: Self-pay | Admitting: Obstetrics & Gynecology

## 2023-09-17 ENCOUNTER — Other Ambulatory Visit: Payer: Self-pay | Admitting: *Deleted

## 2023-09-17 MED ORDER — VEOZAH 45 MG PO TABS: 1.0000 | ORAL_TABLET | Freq: Every day | ORAL | 11 refills | Status: DC

## 2023-09-22 ENCOUNTER — Encounter: Payer: Self-pay | Admitting: *Deleted

## 2023-10-12 ENCOUNTER — Telehealth: Payer: Self-pay | Admitting: Nurse Practitioner

## 2023-10-12 NOTE — Telephone Encounter (Signed)
 Pt needs labs updated

## 2023-10-14 ENCOUNTER — Other Ambulatory Visit: Payer: Self-pay | Admitting: *Deleted

## 2023-10-14 DIAGNOSIS — R7989 Other specified abnormal findings of blood chemistry: Secondary | ICD-10-CM

## 2023-10-14 DIAGNOSIS — R61 Generalized hyperhidrosis: Secondary | ICD-10-CM

## 2023-10-14 DIAGNOSIS — E041 Nontoxic single thyroid nodule: Secondary | ICD-10-CM

## 2023-10-14 NOTE — Telephone Encounter (Signed)
Labs have been updated . 

## 2023-10-21 ENCOUNTER — Ambulatory Visit: Payer: MEDICAID | Admitting: Nurse Practitioner

## 2023-10-26 ENCOUNTER — Encounter: Payer: Self-pay | Admitting: *Deleted

## 2023-11-07 ENCOUNTER — Ambulatory Visit (HOSPITAL_BASED_OUTPATIENT_CLINIC_OR_DEPARTMENT_OTHER): Payer: MEDICAID | Attending: Cardiology | Admitting: Cardiology

## 2023-11-07 DIAGNOSIS — I2510 Atherosclerotic heart disease of native coronary artery without angina pectoris: Secondary | ICD-10-CM | POA: Insufficient documentation

## 2023-11-07 DIAGNOSIS — I10 Essential (primary) hypertension: Secondary | ICD-10-CM | POA: Insufficient documentation

## 2023-11-07 DIAGNOSIS — G4733 Obstructive sleep apnea (adult) (pediatric): Secondary | ICD-10-CM | POA: Diagnosis present

## 2023-11-23 ENCOUNTER — Telehealth: Payer: Self-pay

## 2023-11-23 NOTE — Telephone Encounter (Signed)
-----   Message from Armanda Magic sent at 11/23/2023  8:47 AM EDT ----- Please let patient know that they had a successful PAP titration and let DME know that orders are in EPIC.  Please set up 6 week OV with me.

## 2023-11-23 NOTE — Procedures (Signed)
   [facility] [address] [place], [place] [address] Tel: [phone]   Fax: [phone]  Titration Interpretation  Patient Name:  Briana Wyatt, Briana Wyatt Date:  11/07/2023 Referring Physician:  Gillian Shields MD  Indications for Polysomnography The patient is a 53 year-old Female who is 5\' 6"  and weighs 197.0 lbs. Her BMI equals 32.0.  A full night titration treatment study was performed.  Medication  No Data.   Polysomnogram Data A full night polysomnogram recorded the standard physiologic parameters including EEG, EOG, EMG, EKG, nasal and oral airflow.  Respiratory parameters of chest and abdominal movements were recorded with Respiratory Inductance Plethysmography belts.  Oxygen saturation was recorded by pulse oximetry.   Sleep Architecture The total recording time of the polysomnogram was 369.4 minutes.  The total sleep time was 311.0 minutes.  The patient spent 1.3% of total sleep time in Stage N1, 82.8% in Stage N2, 4.8% in Stages N3, and 11.1% in REM.  Sleep latency was 13.6 minutes.  REM latency was 133.5 minutes.  Sleep Efficiency was 84.2%.  Wake after Sleep Onset time was 45.0 minutes.  Titration Summary The patient was titrated at pressures ranging from 5 cm/H20 up to 20/16 cm/H2O.  The last pressure used in the study was 20/16 cm/H20.  Respiratory Events The polysomnogram revealed a presence of 2 obstructive, 19 central, and - mixed apneas resulting in an Apnea index of 4.1 events per hour.  There were 98 hypopneas (>=3% desaturation and/or arousal) resulting in an Apnea\Hypopnea Index (AHI >=3% desaturation and/or arousal) of 23.0 events per hour.  There were 42 hypopneas (>=4% desaturation) resulting in an Apnea\Hypopnea Index (AHI >=4% desaturation) of 12.2 events per hour.  There were - Respiratory Effort Related Arousals resulting in a RERA index of - events per hour. The Respiratory Disturbance Index is 23.0 events per hour.   The snore index was - events per hour.  Mean oxygen saturation was 92.1%.  The lowest oxygen saturation during sleep was 83.0%.  Time spent <=88% oxygen saturation was 4.0 minutes (1.1%).  Limb Activity There were 12 limb movements recorded.  Of this total, 0 were classified as PLMs.  Of the PLMs, 0 were associated with arousals.  The Limb Movement index was 2.3 per hour while the PLM index was 0 per hour.  Cardiac Summary The average pulse rate was 59.8 bpm.  The minimum pulse rate was 43.0 bpm while the maximum pulse rate was 83.0 bpm.  Cardiac rhythm was abnormal with PVCs.  Diagnosis:  Obstructive Sleep Apnea   Recommendations: Recommend a trial of ResMed BiPAP at 20/16cm H2O with heated humidity and small Simplus Full Face Mask. The patient should be counseled in good sleep hygiene and avoid sleeping in the supine position. The patient should be counseled to avoid driving when sleepy.  Followup in the Sleep Clinic in 6 weeks.   This study was personally reviewed and electronically signed by: Armanda Magic MD Accredited Board Certified in Sleep Medicine Date/Time: 11/23/2023 8:45AM

## 2023-11-23 NOTE — Telephone Encounter (Signed)
 Notified patient of CPAP Titration results and recommendations. Order for PAP device and supplies has been sent to [facility] today.

## 2023-11-23 NOTE — Addendum Note (Signed)
 Addended by: Quintella Reichert on: 11/23/2023 08:48 AM   Modules accepted: Orders

## 2023-12-17 ENCOUNTER — Observation Stay (HOSPITAL_COMMUNITY)
Admission: EM | Admit: 2023-12-17 | Discharge: 2023-12-18 | Disposition: A | Discharge status: Home / Self Care | Payer: MEDICAID | Attending: Family Medicine | Admitting: Family Medicine

## 2023-12-17 ENCOUNTER — Encounter (HOSPITAL_COMMUNITY): Payer: Self-pay

## 2023-12-17 ENCOUNTER — Other Ambulatory Visit: Payer: Self-pay

## 2023-12-17 ENCOUNTER — Emergency Department (HOSPITAL_COMMUNITY): Payer: MEDICAID

## 2023-12-17 DIAGNOSIS — Z79899 Other long term (current) drug therapy: Secondary | ICD-10-CM | POA: Diagnosis not present

## 2023-12-17 DIAGNOSIS — Z7984 Long term (current) use of oral hypoglycemic drugs: Secondary | ICD-10-CM | POA: Diagnosis not present

## 2023-12-17 DIAGNOSIS — F332 Major depressive disorder, recurrent severe without psychotic features: Secondary | ICD-10-CM | POA: Diagnosis not present

## 2023-12-17 DIAGNOSIS — I82461 Acute embolism and thrombosis of right calf muscular vein: Secondary | ICD-10-CM | POA: Insufficient documentation

## 2023-12-17 DIAGNOSIS — E66812 Obesity, class 2: Secondary | ICD-10-CM | POA: Insufficient documentation

## 2023-12-17 DIAGNOSIS — K219 Gastro-esophageal reflux disease without esophagitis: Secondary | ICD-10-CM | POA: Diagnosis present

## 2023-12-17 DIAGNOSIS — J449 Chronic obstructive pulmonary disease, unspecified: Secondary | ICD-10-CM | POA: Insufficient documentation

## 2023-12-17 DIAGNOSIS — E785 Hyperlipidemia, unspecified: Secondary | ICD-10-CM | POA: Insufficient documentation

## 2023-12-17 DIAGNOSIS — E1122 Type 2 diabetes mellitus with diabetic chronic kidney disease: Secondary | ICD-10-CM | POA: Diagnosis not present

## 2023-12-17 DIAGNOSIS — I82431 Acute embolism and thrombosis of right popliteal vein: Secondary | ICD-10-CM | POA: Diagnosis not present

## 2023-12-17 DIAGNOSIS — Z87891 Personal history of nicotine dependence: Secondary | ICD-10-CM | POA: Insufficient documentation

## 2023-12-17 DIAGNOSIS — I129 Hypertensive chronic kidney disease with stage 1 through stage 4 chronic kidney disease, or unspecified chronic kidney disease: Secondary | ICD-10-CM | POA: Diagnosis not present

## 2023-12-17 DIAGNOSIS — N1831 Chronic kidney disease, stage 3a: Secondary | ICD-10-CM | POA: Diagnosis not present

## 2023-12-17 DIAGNOSIS — Z6835 Body mass index (BMI) 35.0-35.9, adult: Secondary | ICD-10-CM | POA: Diagnosis not present

## 2023-12-17 DIAGNOSIS — M79604 Pain in right leg: Secondary | ICD-10-CM | POA: Diagnosis present

## 2023-12-17 DIAGNOSIS — I82411 Acute embolism and thrombosis of right femoral vein: Secondary | ICD-10-CM | POA: Insufficient documentation

## 2023-12-17 DIAGNOSIS — E1140 Type 2 diabetes mellitus with diabetic neuropathy, unspecified: Secondary | ICD-10-CM | POA: Insufficient documentation

## 2023-12-17 DIAGNOSIS — I2694 Multiple subsegmental pulmonary emboli without acute cor pulmonale: Principal | ICD-10-CM | POA: Insufficient documentation

## 2023-12-17 DIAGNOSIS — E039 Hypothyroidism, unspecified: Secondary | ICD-10-CM | POA: Diagnosis not present

## 2023-12-17 DIAGNOSIS — I2699 Other pulmonary embolism without acute cor pulmonale: Secondary | ICD-10-CM | POA: Diagnosis present

## 2023-12-17 DIAGNOSIS — I824Y1 Acute embolism and thrombosis of unspecified deep veins of right proximal lower extremity: Secondary | ICD-10-CM

## 2023-12-17 LAB — APTT: aPTT: 29 s (ref 24–36)

## 2023-12-17 LAB — COMPREHENSIVE METABOLIC PANEL WITH GFR
ALT: 19 U/L (ref 0–44)
AST: 19 U/L (ref 15–41)
Albumin: 3.5 g/dL (ref 3.5–5.0)
Alkaline Phosphatase: 110 U/L (ref 38–126)
Anion gap: 9 (ref 5–15)
BUN: 19 mg/dL (ref 6–20)
CO2: 24 mmol/L (ref 22–32)
Calcium: 9.6 mg/dL (ref 8.9–10.3)
Chloride: 104 mmol/L (ref 98–111)
Creatinine, Ser: 1 mg/dL (ref 0.44–1.00)
GFR, Estimated: >60 mL/min (ref >60)
Glucose, Bld: 92 mg/dL (ref 70–99)
Potassium: 4.5 mmol/L (ref 3.5–5.1)
Sodium: 137 mmol/L (ref 135–145)
Total Bilirubin: 0.8 mg/dL (ref 0.0–1.2)
Total Protein: 7.2 g/dL (ref 6.5–8.1)

## 2023-12-17 LAB — CBC WITH DIFFERENTIAL/PLATELET
Abs Immature Granulocytes: 0.02 10*3/uL (ref 0.00–0.07)
Basophils Absolute: 0.1 10*3/uL (ref 0.0–0.1)
Basophils Relative: 1 %
Eosinophils Absolute: 0.1 10*3/uL (ref 0.0–0.5)
Eosinophils Relative: 1 %
HCT: 35.9 % — ABNORMAL LOW (ref 36.0–46.0)
Hemoglobin: 11.6 g/dL — ABNORMAL LOW (ref 12.0–15.0)
Immature Granulocytes: 0 %
Lymphocytes Relative: 22 %
Lymphs Abs: 1.7 10*3/uL (ref 0.7–4.0)
MCH: 27.4 pg (ref 26.0–34.0)
MCHC: 32.3 g/dL (ref 30.0–36.0)
MCV: 84.7 fL (ref 80.0–100.0)
Monocytes Absolute: 0.7 10*3/uL (ref 0.1–1.0)
Monocytes Relative: 9 %
Neutro Abs: 5.4 10*3/uL (ref 1.7–7.7)
Neutrophils Relative %: 67 %
Platelets: 191 10*3/uL (ref 150–400)
RBC: 4.24 MIL/uL (ref 3.87–5.11)
RDW: 13 % (ref 11.5–15.5)
WBC: 7.9 10*3/uL (ref 4.0–10.5)
nRBC: 0 % (ref 0.0–0.2)

## 2023-12-17 LAB — TROPONIN I (HIGH SENSITIVITY): Troponin I (High Sensitivity): 5 ng/L (ref <18)

## 2023-12-17 LAB — PROTIME-INR
INR: 1.1 (ref 0.8–1.2)
Prothrombin Time: 14 s (ref 11.4–15.2)

## 2023-12-17 LAB — GLUCOSE, CAPILLARY: Glucose-Capillary: 221 mg/dL — ABNORMAL HIGH (ref 70–99)

## 2023-12-17 MED ORDER — LOSARTAN POTASSIUM 25 MG PO TABS: 50.0000 mg | ORAL_TABLET | Freq: Every day | ORAL | Status: DC

## 2023-12-17 MED ORDER — INSULIN ASPART 100 UNIT/ML IJ SOLN
0.0000 [IU] | Freq: Three times a day (TID) | INTRAMUSCULAR | Status: DC
  Administered 2023-12-18: 1 [IU] via SUBCUTANEOUS

## 2023-12-17 MED ORDER — OXYCODONE-ACETAMINOPHEN 5-325 MG PO TABS
1.0000 | ORAL_TABLET | Freq: Once | ORAL | Status: AC
  Administered 2023-12-17: 1 via ORAL
  Filled 2023-12-17: qty 1

## 2023-12-17 MED ORDER — PREGABALIN 75 MG PO CAPS
150.0000 mg | ORAL_CAPSULE | Freq: Two times a day (BID) | ORAL | Status: DC
  Administered 2023-12-17 – 2023-12-18 (×2): 150 mg via ORAL
  Filled 2023-12-17 (×2): qty 2

## 2023-12-17 MED ORDER — PANTOPRAZOLE SODIUM 40 MG PO TBEC
40.0000 mg | DELAYED_RELEASE_TABLET | Freq: Every day | ORAL | Status: DC
  Administered 2023-12-18: 40 mg via ORAL
  Filled 2023-12-17: qty 1

## 2023-12-17 MED ORDER — INSULIN ASPART 100 UNIT/ML IJ SOLN
0.0000 [IU] | Freq: Every day | INTRAMUSCULAR | Status: DC
Start: 2023-12-17 — End: 2023-12-18
  Administered 2023-12-17: 2 [IU] via SUBCUTANEOUS

## 2023-12-17 MED ORDER — IOHEXOL 350 MG/ML SOLN
75.0000 mL | Freq: Once | INTRAVENOUS | Status: AC | PRN
  Administered 2023-12-17: 75 mL via INTRAVENOUS

## 2023-12-17 MED ORDER — ATORVASTATIN CALCIUM 10 MG PO TABS
10.0000 mg | ORAL_TABLET | Freq: Every day | ORAL | Status: DC
  Administered 2023-12-17: 10 mg via ORAL
  Filled 2023-12-17: qty 1

## 2023-12-17 MED ORDER — TRAZODONE HCL 50 MG PO TABS
50.0000 mg | ORAL_TABLET | Freq: Every day | ORAL | Status: DC
  Administered 2023-12-17: 50 mg via ORAL
  Filled 2023-12-17: qty 1

## 2023-12-17 MED ORDER — VENLAFAXINE HCL ER 37.5 MG PO CP24
75.0000 mg | ORAL_CAPSULE | Freq: Every day | ORAL | Status: DC
  Administered 2023-12-18: 75 mg via ORAL
  Filled 2023-12-17 (×2): qty 2

## 2023-12-17 MED ORDER — HEPARIN (PORCINE) 25000 UT/250ML-% IV SOLN
1150.0000 [IU]/h | INTRAVENOUS | Status: DC
  Administered 2023-12-17: 1250 [IU]/h via INTRAVENOUS
  Filled 2023-12-17: qty 250

## 2023-12-17 MED ORDER — HYDROCODONE-ACETAMINOPHEN 5-325 MG PO TABS
1.0000 | ORAL_TABLET | ORAL | Status: DC | PRN
  Administered 2023-12-17 – 2023-12-18 (×2): 1 via ORAL
  Filled 2023-12-17 (×2): qty 1

## 2023-12-17 MED ORDER — HEPARIN BOLUS VIA INFUSION
5000.0000 [IU] | Freq: Once | INTRAVENOUS | Status: AC
  Administered 2023-12-17: 5000 [IU] via INTRAVENOUS

## 2023-12-17 MED ORDER — ARIPIPRAZOLE 5 MG PO TABS
5.0000 mg | ORAL_TABLET | Freq: Every day | ORAL | Status: DC
  Administered 2023-12-18: 5 mg via ORAL
  Filled 2023-12-17: qty 1

## 2023-12-17 NOTE — ED Notes (Signed)
 Patient c/o right lower calf pain "for a few days." Denies any injury.

## 2023-12-17 NOTE — ED Notes (Signed)
 Patient ambulatory to room from restroom.

## 2023-12-17 NOTE — H&P (Signed)
 [facility] H&P   Patient Demographics:    Briana Wyatt, is a 53 y.o. female  MRN: [id number]   DOB - January 27, 1971  Admit Date - 12/17/2023  Outpatient Primary MD for the patient is Joanne Muckle, FNP  Referring MD/NP/PA: PA Christopher  Patient coming from: home  Chief Complaint  Patient presents with   Leg Pain      HPI:    Briana Wyatt  is a 53 y.o. female, with past medical history of diabetes mellitus, hypertension, major depressive disorder, obesity class I, she presents to ED secondary to complaints of right leg pain, she denies any falls, trauma, recent surgery, and history of DVT, or long distance travel, but that she does reports she is on her car for extended period of time each day as she is driving multiple friends and families. - ED her Doppler significant for tensive occlusive DVT, so CTA chest was obtained which was significant for bilateral subsegmental PE but no right heart strain, ED discussed with vascular surgery, she is not a candidate for thrombectomy, he was started on heparin  GTT, [facility] consulted to admit    Review of systems:     A full 10 point Review of Systems was done, except as stated above, all other Review of Systems were negative.   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   Depression    major   Dyspnea    Fatty liver    Fibromyalgia    Headache    Hypertension    Hypothyroidism    Kidney disease, chronic, stage III (moderate, EGFR 30-59 ml/min) (HCC)    Movement disorder    Pre-diabetes    PTSD (post-traumatic stress disorder)    Restless leg syndrome    Sciatica of right  side    Sleep apnea    mild, pt states she is awaiting insurance approval for CPAP   Spondylosis    Cervical and lumbar   Substance abuse (HCC)    Hx Crack/cocaine (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)   Tortuous colon       Past Surgical History:  Procedure Laterality Date   ANTERIOR CERVICAL DECOMP/DISCECTOMY FUSION N/A 02/23/2023   Procedure: Anterior Cervical Decompression/Discectomy Fusion, Cervical Four-Cervical Five, Cervical Five-Cervical Six, Cervical Six-Cervical Cervical Cervical Seven;  Surgeon: Augusto Blonder, MD;  Location: [facility] OR;  Service: Neurosurgery;  Laterality: N/A;  3C   BIOPSY  04/29/2022   Procedure: BIOPSY;  Surgeon: Urban Garden, MD;  Location: [facility];  Service: Gastroenterology;;   CARPAL TUNNEL RELEASE Bilateral    COLONOSCOPY WITH PROPOFOL  N/A  04/29/2022   Procedure: COLONOSCOPY WITH PROPOFOL ;  Surgeon: Urban Garden, MD;  Location: [facility];  Service: Gastroenterology;  Laterality: N/A;  145   ECTOPIC PREGNANCY SURGERY     ESOPHAGOGASTRODUODENOSCOPY (EGD) WITH PROPOFOL  N/A 04/29/2022   Procedure: ESOPHAGOGASTRODUODENOSCOPY (EGD) WITH PROPOFOL ;  Surgeon: Urban Garden, MD;  Location: [facility];  Service: Gastroenterology;  Laterality: N/A;   SHOULDER SURGERY Right    rotator cuff repair- fell 6 months later and had to have 2nd shoulder surgery   SHOULDER SURGERY Right    2nd surgery due to injury 6 months after 1st shoulder surgery   TUBAL LIGATION     ULNAR NERVE TRANSPOSITION Right 12/16/2022   Procedure: ULNAR NERVE RELEASE AT ELBOW;  Surgeon: Augusto Blonder, MD;  Location: [facility] OR;  Service: Neurosurgery;  Laterality: Right;      Social History:     Social History   Tobacco Use   Smoking status: Former    Current packs/day: 0.00    Types: Cigarettes    Quit date: 08/25/1995    Years since quitting: 28.3    Passive exposure: Past   Smokeless tobacco: Never  Substance Use Topics   Alcohol use: No        Family History :     Family History  Problem Relation Age of Onset   Hypertension Father    Stroke Father    Hypertension Sister    Dystonia Sister      Home Medications:   Prior to Admission medications   Medication Sig Start Date End Date Taking? Authorizing Provider  acetaminophen  (TYLENOL ) 650 MG CR tablet Take 650 mg by mouth every 8 (eight) hours as needed for pain or fever. 03/02/19  Yes [provider]  APPLE CIDER VINEGAR PO Take 2 capsules by mouth daily.   Yes [provider]  ARIPiprazole  (ABILIFY ) 5 MG tablet Take 5 mg by mouth daily.   Yes [provider]  atorvastatin  (LIPITOR) 10 MG tablet Take 10 mg by mouth daily. 02/08/23  Yes [provider]  cetirizine (ZYRTEC) 10 MG tablet Take 10 mg by mouth daily.   Yes [provider]  desvenlafaxine (PRISTIQ) 50 MG 24 hr tablet Take 50 mg by mouth daily.   Yes [provider]  hydrOXYzine  (ATARAX ) 50 MG tablet Take 50 mg by mouth 2 (two) times daily as needed for anxiety.   Yes [provider]  losartan  (COZAAR ) 50 MG tablet TAKE 1 Tablet BY MOUTH ONCE EVERY DAY Patient taking differently: Take 50 mg by mouth daily. TAKE 1 Tablet BY MOUTH ONCE EVERY DAY 09/18/22  Yes Zarwolo, Gloria, FNP  metFORMIN  (GLUCOPHAGE ) 500 MG tablet Take 500 mg by mouth daily with breakfast. 10/28/23  Yes [provider]  omeprazole  (PRILOSEC) 40 MG capsule TAKE 1 Capsule BY MOUTH ONCE EVERY DAY Patient taking differently: Take 40 mg by mouth daily. TAKE 1 Capsule BY MOUTH ONCE EVERY DAY 08/03/22  Yes Zarwolo, Gloria, FNP  pregabalin  (LYRICA ) 150 MG capsule Take 150 mg by mouth 2 (two) times daily. 11/30/22  Yes [provider]  tiZANidine  (ZANAFLEX ) 2 MG tablet Take 2 mg by mouth at bedtime. 02/02/22  Yes [provider]  traZODone  (DESYREL ) 50 MG tablet Take 50 mg by mouth at bedtime.   Yes [provider]     Allergies:     Allergies  Allergen  Reactions   Codeine Nausea And Vomiting    High doses of codeine pt reports greater  than 30mg  she can't tolerate    Nsaids Other (See Comments)    CKD, only one kidney   Diclofenac Sodium Itching    Pennsaid Solution   Hydrocodone -Acetaminophen  Nausea And Vomiting    High doses makes pt vomit Nothing above 30 mg   Methocarbamol Itching and Other (See Comments)    "feels like bugs are crawling under skin"     Physical Exam:   Vitals  Blood pressure (!) 164/91, pulse 92, temperature 97.6 F (36.4 C), temperature source Oral, resp. rate (!) 24, height 5\' 5"  (1.651 m), weight 93.9 kg, last menstrual period 08/25/2019, SpO2 98%.   1. General Alert female, laying in bed, no apparent distress  2. Normal affect and insight, Not Suicidal or Homicidal, Awake Alert, Oriented X 3.  3. No F.N deficits, ALL C.Nerves Intact, Strength 5/5 all 4 extremities, Sensation intact all 4 extremities, Plantars down going.  4. Ears and Eyes appear Normal, Conjunctivae clear, PERRLA. Moist Oral Mucosa.  5. Supple Neck, No JVD, No cervical lymphadenopathy appriciated, No Carotid Bruits.  6. Symmetrical Chest wall movement, Good air movement bilaterally, CTAB.  7. RRR, No Gallops, Rubs or Murmurs, No Parasternal Heave.  8. Positive Bowel Sounds, Abdomen Soft, No tenderness, No organomegaly appriciated,No rebound -guarding or rigidity.  9.  No Cyanosis, Normal Skin Turgor, No Skin Rash or Bruise.Right lower ext +1 edema   10. Good muscle tone,  joints appear normal , no effusions, Normal ROM.    Data Review:    CBC Recent Labs  Lab 12/17/23 1547  WBC 7.9  HGB 11.6*  HCT 35.9*  PLT 191  MCV 84.7  MCH 27.4  MCHC 32.3  RDW 13.0  LYMPHSABS 1.7  MONOABS 0.7  EOSABS 0.1  BASOSABS 0.1   ------------------------------------------------------------------------------------------------------------------  Chemistries  Recent Labs  Lab 12/17/23 1547  NA 137  K 4.5  CL 104  CO2 24   GLUCOSE 92  BUN 19  CREATININE 1.00  CALCIUM  9.6  AST 19  ALT 19  ALKPHOS 110  BILITOT 0.8   ------------------------------------------------------------------------------------------------------------------ estimated creatinine clearance is 72 mL/min (by C-G formula based on SCr of 1 mg/dL). ------------------------------------------------------------------------------------------------------------------ No results for input(s): "TSH", "T4TOTAL", "T3FREE", "THYROIDAB" in the last 72 hours.  Invalid input(s): "FREET3"  Coagulation profile Recent Labs  Lab 12/17/23 1547  INR 1.1   ------------------------------------------------------------------------------------------------------------------- No results for input(s): "DDIMER" in the last 72 hours. -------------------------------------------------------------------------------------------------------------------  Cardiac Enzymes No results for input(s): "CKMB", "TROPONINI", "MYOGLOBIN" in the last 168 hours.  Invalid input(s): "CK" ------------------------------------------------------------------------------------------------------------------    Component Value Date/Time   BNP 7.0 03/31/2022 0010     ---------------------------------------------------------------------------------------------------------------  Urinalysis    Component Value Date/Time   COLORURINE YELLOW 03/12/2022 1050   APPEARANCEUR CLEAR 03/12/2022 1050   LABSPEC 1.018 03/12/2022 1050   PHURINE 6.0 03/12/2022 1050   GLUCOSEU NEGATIVE 03/12/2022 1050   HGBUR NEGATIVE 03/12/2022 1050   BILIRUBINUR negative 08/18/2022 1615   BILIRUBINUR negative 11/17/2021 1326   KETONESUR negative 08/18/2022 1615   KETONESUR NEGATIVE 03/12/2022 1050   PROTEINUR 30 (A) 03/12/2022 1050   UROBILINOGEN 0.2 08/18/2022 1615   NITRITE Negative 08/18/2022 1615   NITRITE NEGATIVE 03/12/2022 1050   LEUKOCYTESUR Negative 08/18/2022 1615   LEUKOCYTESUR SMALL (A)  03/12/2022 1050    ----------------------------------------------------------------------------------------------------------------   Imaging Results:    CT Angio Chest PE W/Cm &/Or Wo Cm Result Date: 12/17/2023 CLINICAL DATA:  Pulmonary embolism (PE) suspected, high prob. Leg pain/swelling, abnormal ultrasound with DVT. EXAM: CT ANGIOGRAPHY CHEST WITH CONTRAST  TECHNIQUE: Multidetector CT imaging of the chest was performed using the standard protocol during bolus administration of intravenous contrast. Multiplanar CT image reconstructions and MIPs were obtained to evaluate the vascular anatomy. RADIATION DOSE REDUCTION: This exam was performed according to the departmental dose-optimization program which includes automated exposure control, adjustment of the mA and/or kV according to patient size and/or use of iterative reconstruction technique. CONTRAST:  75mL OMNIPAQUE  IOHEXOL  350 MG/ML SOLN COMPARISON:  Ultrasound thyroid  07/27/2023, ultrasound right extremity 12/17/2023, CT heart 02/02/2022. FINDINGS: Cardiovascular: Satisfactory opacification of the pulmonary arteries to the segmental level. Bilateral lower lobe segmental and subsegmental pulmonary artery filling defect. Prominent left ventricle. No significant pericardial effusion. The thoracic aorta is normal in caliber. No atherosclerotic plaque of the thoracic aorta. No coronary artery calcifications. Mediastinum/Nodes: No enlarged mediastinal, hilar, or axillary lymph nodes. Trachea and esophagus demonstrate no significant findings. Enlarged right thyroid  gland with poorly visualized hypodense nodules. This has been evaluated on previous imaging. (ref: J Am Coll Radiol. 2015 Feb;12(2): 143-50). Lungs/Pleura: No focal consolidation. No pulmonary nodule. No pulmonary mass. No pleural effusion. No pneumothorax. Upper Abdomen: No acute abnormality. Musculoskeletal: No chest wall abnormality. No suspicious lytic or blastic osseous lesions. No acute  displaced fracture. Multilevel degenerative changes of the spine. Review of the MIP images confirms the above findings. IMPRESSION: Bilateral lower lobe segmental and subsegmental pulmonary emboli. No associated heart strain or pulmonary infarction. These results were called by telephone at the time of interpretation on 12/17/2023 at 6:12 pm to provider Dr. Scarlette Currier, who verbally acknowledged these results. Electronically Signed   By: Morgane  Naveau M.D.   On: 12/17/2023 18:12   US  Venous Img Lower Right (DVT Study) Result Date: 12/17/2023 CLINICAL DATA:  Right lower extremity pain and swelling for 2 days. EXAM: Right LOWER EXTREMITY VENOUS DOPPLER ULTRASOUND TECHNIQUE: Gray-scale sonography with compression, as well as color and duplex ultrasound, were performed to evaluate the deep venous system(s) from the level of the common femoral vein through the popliteal and proximal calf veins. COMPARISON:  None Available. FINDINGS: VENOUS There is regular compression of the common femoral vein with preserved flow on color and spectral Doppler. Same for the saphenofemoral junction of the profunda femoral vein. However the femoral vein, popliteal vein and calf veins have poor flow on color and spectral Doppler as well as difficult compression consistent with extensive occlusive DVT. Limited views of the contralateral common femoral vein are unremarkable. OTHER None. Limitations: none IMPRESSION: Extensive occlusive DVT involving the femoral, popliteal and calf veins of the right lower extremity. Critical Value/emergent results were called by telephone at the time of interpretation on 12/17/2023 at 3:29 pm to provider CHRISTOPHER RIGNEY , who verbally acknowledged these results. Electronically Signed   By: Adrianna Horde M.D.   On: 12/17/2023 15:29   DG Tibia/Fibula Right Result Date: 12/17/2023 CLINICAL DATA:  Right leg pain. EXAM: RIGHT TIBIA AND FIBULA - 2 VIEW COMPARISON:  Right ankle radiographs dated 05/26/2017.  FINDINGS: No acute fracture or dislocation. A well corticated ossicle adjacent to the tip of the lateral malleolus is again noted and likely relates to prior trauma. Mild nonspecific subcutaneous edema of the right lower extremity. IMPRESSION: 1. No acute osseous abnormality. 2. Mild nonspecific subcutaneous edema of the right lower extremity. Electronically Signed   By: Mannie Seek M.D.   On: 12/17/2023 14:58     EKG: Vent. rate 83 BPM PR interval 151 ms QRS duration 113 ms QT/QTcB 397/467 ms P-R-T axes 53 34 54 Sinus rhythm  Borderline intraventricular conduction delay No significant change since last tracing Confirmed by Sueellen Emery [phone]) on 4/  Assessment & Plan:    Principal Problem:   Pulmonary embolism (HCC) Active Problems:   MDD (major depressive disorder), recurrent severe, without psychosis (HCC)   Gastroesophageal reflux disease without esophagitis   Chronic kidney disease, stage 3a (HCC)    Acute bilateral PE Acute DVT - Patient presents with leg edema, initial workup significant for DVT, and CTA chest significant for acute bilateral PE -started on heparin  GTT, likely can transition to DOAC tomorrow Obtain 2D echo to ensure no right heart strain -She does report having for long periods of time, this would be her major risk factors, otherwise denies any contraception use or smoking -ED discussed with vascular surgery regarding thrombectomy, she is not a candidate  MDD, recurrent, severe, with psychosis - Continue home medication including Abilify , Pristiq,  Diabetes mellitus - Hold metformin  and keep on insulin  sliding scale  Hyper tension-continue with losartan   Hyperlipidemia - Continue with statin  GERD - Continue with PPI  Neuropathy - Continue with Lyrica   Obesity class I -Body mass index is 34.45 kg/m.   DVT Prophylaxis Heparin  -gtt  AM Labs Ordered, also please review Full Orders  Family Communication: Admission, patients  condition and plan of care including tests being ordered have been discussed with the patient  who indicate understanding and agree with the plan and Code Status.  Code Status full  Likely DC to  home  Consults called: none    Admission status: observation    Time spent in minutes : 70 minutes   Seena Dadds M.D on 12/17/2023 at 7:59 PM   [facility] - Office  [phone]

## 2023-12-17 NOTE — Progress Notes (Signed)
 PHARMACY - ANTICOAGULATION CONSULT NOTE  Pharmacy Consult for Heparin  Infusion Indication: pulmonary embolus  Allergies  Allergen Reactions   Codeine Nausea And Vomiting    High doses of codeine pt reports greater than 30mg  she can't tolerate    Methocarbamol Itching    "feels like bugs are crawling under skin"   Nsaids     Other reaction(s): Contraindicated CKD, only one kidney Other reaction(s): Contraindicated CKD, only one kidney    Patient Measurements: Height: 5\' 5"  (165.1 cm) Weight: 93.9 kg (207 lb) IBW/kg (Calculated) : 57 HEPARIN  DW (KG): 78  Vital Signs: Temp: 97.6 F (36.4 C) (04/25 1812) Temp Source: Oral (04/25 1812) BP: 145/93 (04/25 1326) Pulse Rate: 87 (04/25 1812)  Labs: Recent Labs    12/17/23 1547  HGB 11.6*  HCT 35.9*  PLT 191  CREATININE 1.00  TROPONINIHS 5    Estimated Creatinine Clearance: 72 mL/min (by C-G formula based on SCr of 1 mg/dL).   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   Depression    major   Dyspnea    Fatty liver    Fibromyalgia    Headache    Hypertension    Hypothyroidism    Kidney disease, chronic, stage III (moderate, EGFR 30-59 ml/min) (HCC)    Movement disorder    Pre-diabetes    PTSD (post-traumatic stress disorder)    Restless leg syndrome    Sciatica of right side    Sleep apnea    mild, pt states she is awaiting insurance approval for CPAP   Spondylosis    Cervical and lumbar   Substance abuse (HCC)    Hx Crack/cocaine (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)   Tortuous colon     Medications:  No on anticoagulation at home per chart review  Assessment: Patient is a 53 year old female who presented to the ED with the chief complaint of right leg swelling that started a few days ago and  is now hurting up into her hip and back. Denies history of DVT. Denies any redness or warmth to affected extremity. No reported chest pain or SOB. US  RLE showed extensive occlusive DVT involving the femoral, popliteal, and calf veins of the RLE. CTA showed bilateral lower lob segmental and subsegmental pulmonary emboli with no associated heart strain or pulmonary infarction. Pharmacy has been consulted to initiate patient on a heparin  infusion.   Baseline aPTT and INR ordered.  No signs/symptoms of bleeding noted in chart. Hgb 11.6. PLT 191.  Goal of Therapy:  Heparin  level 0.3-0.7 units/ml Monitor platelets by anticoagulation protocol: Yes   Plan:  Give 5000 unit bolus x 1 Start heparin  infusion at a rate of 1250 units/hr Check heparin  level in 6 hours after start of infusion Monitor CBC daily while on heparin   Alice Innocent, PharmD Clinical Pharmacist  12/17/2023,6:23 PM

## 2023-12-17 NOTE — ED Provider Notes (Signed)
 [place] EMERGENCY DEPARTMENT AT [facility] Provider Note   CSN: [id number] Arrival date & time: 12/17/23  1303     History  Chief Complaint  Patient presents with   Leg Pain    Briana Wyatt is a 53 y.o. female.  Patient is a 53 year old female who presents emergency department the chief complaint of pain and swelling to the right leg.  She notes that this has been ongoing for approximately past 3 days.  She denies any recent falls or blunt trauma.  She denies any history of DVT.  She notes that she does have a history of peripheral edema and has been compliant with her medications.  She notes that the pain does radiate up towards her right hip and back.  She denies any redness or warmth to the affected extremity.  She has had no associated chest pain or shortness of breath.   Leg Pain      Home Medications Prior to Admission medications   Medication Sig Start Date End Date Taking? Authorizing Provider  acetaminophen  (TYLENOL ) 650 MG CR tablet Take 650 mg by mouth every 8 (eight) hours as needed for pain or fever. 03/02/19   [provider]  albuterol  (VENTOLIN  HFA) 108 (90 Base) MCG/ACT inhaler Inhale 2 puffs into the lungs daily as needed for wheezing or shortness of breath (COPD).    [provider]  APPLE CIDER VINEGAR PO Take 2 capsules by mouth daily.    [provider]  ARIPiprazole  (ABILIFY ) 5 MG tablet Take 5 mg by mouth daily.    [provider]  atorvastatin  (LIPITOR) 10 MG tablet Take 10 mg by mouth daily. 02/08/23   [provider]  cetirizine (ZYRTEC) 10 MG tablet Take 10 mg by mouth daily.    [provider]  DULoxetine  (CYMBALTA ) 30 MG capsule Take 1 capsule (30 mg total) by mouth 2 (two) times daily. 03/16/22 02/23/23  Massengill, Elana Grayer, MD  Fezolinetant  (VEOZAH ) 45 MG TABS Take 1 tablet (45 mg total) by mouth at bedtime. 09/17/23   Wendelyn Halter, MD  hydrOXYzine  (ATARAX ) 50 MG tablet Take 50 mg by  mouth 2 (two) times daily as needed for anxiety.    [provider]  losartan  (COZAAR ) 50 MG tablet TAKE 1 Tablet BY MOUTH ONCE EVERY DAY 09/18/22   Zarwolo, Gloria, FNP  omeprazole  (PRILOSEC) 40 MG capsule TAKE 1 Capsule BY MOUTH ONCE EVERY DAY 08/03/22   Zarwolo, Gloria, FNP  pregabalin  (LYRICA ) 150 MG capsule Take 150 mg by mouth 2 (two) times daily. 11/30/22   [provider]  tiZANidine  (ZANAFLEX ) 2 MG tablet Take 2 mg by mouth every 6 (six) hours as needed for muscle spasms. 02/02/22   [provider]  traZODone  (DESYREL ) 50 MG tablet Take 50 mg by mouth at bedtime.    [provider]      Allergies    Codeine, Methocarbamol, and Nsaids    Review of Systems   Review of Systems  Musculoskeletal:        Pain and swelling to right lower extremity  All other systems reviewed and are negative.   Physical Exam Updated Vital Signs BP (!) 145/93 (BP Location: Right Arm)   Pulse 88   Temp 97.8 F (36.6 C) (Oral)   Resp 16   Ht 5\' 5"  (1.651 m)   Wt 93.9 kg   LMP 08/25/2019   SpO2 98%   BMI 34.45 kg/m  Physical Exam Vitals and nursing note reviewed.  Constitutional:      Appearance: Normal appearance.  HENT:     Head: Normocephalic and atraumatic.  Cardiovascular:     Rate and Rhythm: Normal rate and regular rhythm.     Pulses: Normal pulses.  Pulmonary:     Effort: Pulmonary effort is normal. No respiratory distress.  Abdominal:     General: Abdomen is flat. Bowel sounds are normal.     Palpations: Abdomen is soft.  Musculoskeletal:        General: Normal range of motion.     Cervical back: Normal range of motion and neck supple.     Comments: Tender to palpation noted over the posterior aspect of the right knee and right calf, nontender palpation of right hip, ankle or foot, DP and PT pulses are 2+ distally, sensation intact distally, full range of motion noted throughout, no overlying erythema or warmth, no obvious deformity or bruising,  no skin breakdown or ulceration, no lacerations or abrasions, sensation intact distally, cap refill less than 2 seconds distally  Skin:    General: Skin is warm and dry.  Neurological:     General: No focal deficit present.     Mental Status: She is alert and oriented to person, place, and time. Mental status is at baseline.  Psychiatric:        Mood and Affect: Mood normal.        Behavior: Behavior normal.        Thought Content: Thought content normal.        Judgment: Judgment normal.     ED Results / Procedures / Treatments   Labs (all labs ordered are listed, but only abnormal results are displayed) Labs Reviewed - No data to display  EKG None  Radiology No results found.  Procedures Procedures    Medications Ordered in ED Medications  oxyCODONE -acetaminophen  (PERCOCET/ROXICET) 5-325 MG per tablet 1 tablet (has no administration in time range)    ED Course/ Medical Decision Making/ A&P                                 Medical Decision Making Amount and/or Complexity of Data Reviewed Labs: ordered. Radiology: ordered.  Risk Prescription drug management. Decision regarding hospitalization.   This patient presents to the ED for concern of pain to right lower extremity, shortness of breath, this involves an extensive number of treatment options, and is a complaint that carries with it a high risk of complications and morbidity.  The differential diagnosis includes DVT, PE, muscle strain, acute CHF, peripheral edema, dependent edema   Co morbidities that complicate the patient evaluation  Frequent travel   Additional history obtained:  Additional history obtained from none External records from outside source obtained and reviewed including none   Lab Tests:  I Ordered, and personally interpreted labs.  The pertinent results include: No leukocytosis, mild anemia, normal platelets, normal electrolytes, normal kidney function liver function, negative  troponin   Imaging Studies ordered:  I ordered imaging studies including venous duplex of right lower extremity, CT of the chest I independently visualized and interpreted imaging which showed occlusive thrombus of the right lower extremity, bilateral subsegmental PE, no right heart strain I agree with the radiologist interpretation   Cardiac Monitoring: / EKG:  The patient was maintained on a cardiac monitor.  I personally viewed and interpreted the cardiac monitored which showed an underlying rhythm of: Sinus rhythm, rate of 83, normal  PR/QRS interval, normal QTc, no ST/T wave changes, no ischemic changes, no STEMI, normal axis   Consultations Obtained:  I requested consultation with the vascular surgery, Dr. Fulton Job,  and discussed lab and imaging findings as well as pertinent plan - they recommend: Can follow-up on an outpatient basis regarding the DVT, does not require thrombectomy   Problem List / ED Course / Critical interventions / Medication management  Patient is doing well at this time.  Discussed with patient we will plan for admission to the hospitalist service for her bilateral PEs and extensive DVT.  Patient has no indication for cerulea dolens or cerulea albicans.  She has intact peripheral pulses in bilateral lower extremities and no signs of acute arterial occlusion.  Blood work has otherwise been unremarkable.  She has no indication for right heart strain at this point.  Troponin is negative.  Patient has no acute complaints while at rest.  Have discussed patient case with Dr. Osborne Blazer who has excepted for admission at this time. I ordered medication including heparin , oxycodone  for DVT and acute pain Reevaluation of the patient after these medicines showed that the patient improved I have reviewed the patients home medicines and have made adjustments as needed   Social Determinants of Health:  None   Test / Admission - Considered:  Admission        Final  Clinical Impression(s) / ED Diagnoses Final diagnoses:  None    Rx / DC Orders ED Discharge Orders     None         Briana Wyatt 12/17/23 1911    Ninetta Basket, MD 12/19/23 [phone]

## 2023-12-17 NOTE — ED Triage Notes (Signed)
 Pt stated that her right leg began swelling a few days ago and is now hurting up into her hip and back.

## 2023-12-18 ENCOUNTER — Observation Stay (HOSPITAL_BASED_OUTPATIENT_CLINIC_OR_DEPARTMENT_OTHER): Payer: MEDICAID

## 2023-12-18 DIAGNOSIS — I2609 Other pulmonary embolism with acute cor pulmonale: Secondary | ICD-10-CM | POA: Diagnosis not present

## 2023-12-18 DIAGNOSIS — I2694 Multiple subsegmental pulmonary emboli without acute cor pulmonale: Secondary | ICD-10-CM | POA: Diagnosis not present

## 2023-12-18 LAB — BASIC METABOLIC PANEL WITH GFR
Anion gap: 9 (ref 5–15)
BUN: 18 mg/dL (ref 6–20)
CO2: 24 mmol/L (ref 22–32)
Calcium: 8.8 mg/dL — ABNORMAL LOW (ref 8.9–10.3)
Chloride: 106 mmol/L (ref 98–111)
Creatinine, Ser: 0.98 mg/dL (ref 0.44–1.00)
GFR, Estimated: >60 mL/min (ref >60)
Glucose, Bld: 140 mg/dL — ABNORMAL HIGH (ref 70–99)
Potassium: 3.8 mmol/L (ref 3.5–5.1)
Sodium: 139 mmol/L (ref 135–145)

## 2023-12-18 LAB — CBC
HCT: 35.1 % — ABNORMAL LOW (ref 36.0–46.0)
Hemoglobin: 11.2 g/dL — ABNORMAL LOW (ref 12.0–15.0)
MCH: 26.9 pg (ref 26.0–34.0)
MCHC: 31.9 g/dL (ref 30.0–36.0)
MCV: 84.2 fL (ref 80.0–100.0)
Platelets: 198 10*3/uL (ref 150–400)
RBC: 4.17 MIL/uL (ref 3.87–5.11)
RDW: 13.1 % (ref 11.5–15.5)
WBC: 7.3 10*3/uL (ref 4.0–10.5)
nRBC: 0 % (ref 0.0–0.2)

## 2023-12-18 LAB — HEPARIN LEVEL (UNFRACTIONATED)
Heparin Unfractionated: 0.64 [IU]/mL (ref 0.30–0.70)
Heparin Unfractionated: 0.84 [IU]/mL — ABNORMAL HIGH (ref 0.30–0.70)

## 2023-12-18 LAB — ECHOCARDIOGRAM COMPLETE
AR max vel: 2.22 cm2
AV Area VTI: 1.94 cm2
AV Area mean vel: 2.09 cm2
AV Mean grad: 5 mmHg
AV Peak grad: 6.7 mmHg
Ao pk vel: 1.29 m/s
Area-P 1/2: 3.39 cm2
Height: 65 in
S' Lateral: 3.6 cm
Weight: 3379.21 [oz_av]

## 2023-12-18 LAB — GLUCOSE, CAPILLARY: Glucose-Capillary: 139 mg/dL — ABNORMAL HIGH (ref 70–99)

## 2023-12-18 LAB — HEMOGLOBIN A1C
Hgb A1c MFr Bld: 5.4 % (ref 4.8–5.6)
Mean Plasma Glucose: 108.28 mg/dL

## 2023-12-18 LAB — HIV ANTIBODY (ROUTINE TESTING W REFLEX): HIV Screen 4th Generation wRfx: NONREACTIVE

## 2023-12-18 MED ORDER — APIXABAN 5 MG PO TABS
10.0000 mg | ORAL_TABLET | Freq: Two times a day (BID) | ORAL | Status: DC
  Administered 2023-12-18: 10 mg via ORAL
  Filled 2023-12-18: qty 2

## 2023-12-18 MED ORDER — APIXABAN 5 MG PO TABS: ORAL_TABLET | ORAL | 0 refills | Status: DC

## 2023-12-18 MED ORDER — ACETAMINOPHEN 325 MG PO TABS
650.0000 mg | ORAL_TABLET | Freq: Four times a day (QID) | ORAL | Status: DC | PRN
  Administered 2023-12-18: 650 mg via ORAL
  Filled 2023-12-18: qty 2

## 2023-12-18 MED ORDER — ONDANSETRON HCL 4 MG/2ML IJ SOLN
4.0000 mg | Freq: Three times a day (TID) | INTRAMUSCULAR | Status: DC | PRN
  Administered 2023-12-18: 4 mg via INTRAVENOUS
  Filled 2023-12-18: qty 2

## 2023-12-18 MED ORDER — APIXABAN 5 MG PO TABS: 5.0000 mg | ORAL_TABLET | Freq: Two times a day (BID) | ORAL | Status: DC

## 2023-12-18 NOTE — Discharge Summary (Signed)
 Physician Discharge Summary   Patient: Briana Wyatt MRN: [id number] DOB: [birth year]  Admit date:     12/17/2023  Discharge date: 12/18/23  Discharge Physician: Wynetta Heckle   PCP: Joanne Muckle, FNP   Recommendations at discharge:  Follow up with PCP within the next 2-4 weeks for ongoing follow up of chronic medical conditions and monitoring/refilling oral anticoagulation.  Follow up with vascular surgery after discharge for extensive RLE acute DVT that does not require intervention at this time.  Discharge Diagnoses: Principal Problem:   Pulmonary embolism (HCC) Active Problems:   MDD (major depressive disorder), recurrent severe, without psychosis (HCC)   Gastroesophageal reflux disease without esophagitis   Chronic kidney disease, stage 3a [facility])  Hospital Course: Briana Wyatt  is a 53 y.o. female, with past medical history of diabetes mellitus, hypertension, major depressive disorder, obesity class I, she presents to ED secondary to complaints of right leg pain, she denies any falls, trauma, recent surgery, and history of DVT, or long distance travel, but that she does reports she is on her car for extended period of time each day as she is driving multiple friends and families. - ED her Doppler significant for tensive occlusive DVT, so CTA chest was obtained which was significant for bilateral subsegmental PE but no right heart strain, ED discussed with vascular surgery, she is not a candidate for thrombectomy, he was started on heparin  GTT, [facility] hospitalist consulted to admit  Assessment and Plan: Acute bilateral PE Acute right femoral, popliteal, calf vein DVTs: Only provocation is frequent immobility. Symptoms have improved since initiation of anticoagulation. No bleeding noted. No RV strain radiographically and PESI score is low risk with normal vital signs and cardiac enzymes.  - Transitioned heparin  to eliquis, will continue loading dose (prescribed at discharge) -  EDP discussed with vascular surgery regarding thrombectomy, Dr. Fulton Job, who stated thrombectomy was not currently indicated. We will have her follow up after discharge.    MDD, recurrent, severe, with psychosis - Continue home medication including Abilify , Pristiq   T2DM: Continue home Tx.   HTN: Continue home losartan    Hyperlipidemia: Continue statin   GERD: Continue PPI   Neuropathy: Continue home lyrica    Obesity class II Body mass index is 35.15 kg/m.   Consultants: VVS remotely Procedures performed: Echo  Disposition: Home Diet recommendation:  Cardiac and Carb modified diet DISCHARGE MEDICATION: Allergies as of 12/18/2023       Reactions   Codeine Nausea And Vomiting   High doses of codeine pt reports greater than 30mg  she can't tolerate   Nsaids Other (See Comments)   CKD, only one kidney   Diclofenac Sodium Itching   Pennsaid Solution   Hydrocodone -acetaminophen  Nausea And Vomiting   High doses makes pt vomit Nothing above 30 mg   Methocarbamol Itching, Other (See Comments)   "feels like bugs are crawling under skin"        Medication List     TAKE these medications    acetaminophen  650 MG CR tablet Commonly known as: TYLENOL  Take 650 mg by mouth every 8 (eight) hours as needed for pain or fever.   apixaban 5 MG Tabs tablet Commonly known as: ELIQUIS Take 2 tablets (10 mg total) by mouth 2 (two) times daily for 7 days, THEN 1 tablet (5 mg total) 2 (two) times daily for 23 days. Start taking on: December 18, 2023   APPLE CIDER VINEGAR PO Take 2 capsules by mouth daily.   ARIPiprazole  5 MG tablet Commonly  known as: ABILIFY  Take 5 mg by mouth daily.   atorvastatin  10 MG tablet Commonly known as: LIPITOR Take 10 mg by mouth daily.   cetirizine 10 MG tablet Commonly known as: ZYRTEC Take 10 mg by mouth daily.   desvenlafaxine 50 MG 24 hr tablet Commonly known as: PRISTIQ Take 50 mg by mouth daily.   hydrOXYzine  50 MG tablet Commonly known as:  ATARAX  Take 50 mg by mouth 2 (two) times daily as needed for anxiety.   losartan  50 MG tablet Commonly known as: COZAAR  TAKE 1 Tablet BY MOUTH ONCE EVERY DAY What changed:  how much to take how to take this when to take this   metFORMIN  500 MG tablet Commonly known as: GLUCOPHAGE  Take 500 mg by mouth daily with breakfast.   omeprazole  40 MG capsule Commonly known as: PRILOSEC TAKE 1 Capsule BY MOUTH ONCE EVERY DAY What changed:  how much to take how to take this when to take this   pregabalin  150 MG capsule Commonly known as: LYRICA  Take 150 mg by mouth 2 (two) times daily.   tiZANidine  2 MG tablet Commonly known as: ZANAFLEX  Take 2 mg by mouth at bedtime.   traZODone  50 MG tablet Commonly known as: DESYREL  Take 50 mg by mouth at bedtime.        Follow-up Information     Joanne Muckle, FNP Follow up.   Specialty: Family Medicine Contact information: [address] [place] [address] [phone]         Young Hensen, MD. Call.   Specialty: Vascular Surgery Contact information: [address] [place] [address] [phone]                Discharge Exam: Cleavon Curls Weights   12/17/23 1322 12/17/23 2006  Weight: 93.9 kg 95.8 kg  BP (!) 143/88 (BP Location: Left Arm)   Pulse 74   Temp 97.8 F (36.6 C) (Oral)   Resp 18   Ht 5\' 5"  (1.651 m)   Wt 95.8 kg   LMP 08/25/2019   SpO2 100%   BMI 35.15 kg/m   Pleuritic chest pain and right leg soreness is improved. She is laying in bed hoping to be discharged so she can go home and get some rest.  Clear, nonlabored RRR, no MRG, mild RLE edema and mild popliteal tenderness to palpation No skin changes to RLE, foot is warm and dry.  Condition at discharge: stable  The results of significant diagnostics from this hospitalization (including imaging, microbiology, ancillary and laboratory) are listed below for reference.   Imaging Studies: CT Angio Chest PE W/Cm &/Or Wo Cm Result Date:  12/17/2023 CLINICAL DATA:  Pulmonary embolism (PE) suspected, high prob. Leg pain/swelling, abnormal ultrasound with DVT. EXAM: CT ANGIOGRAPHY CHEST WITH CONTRAST TECHNIQUE: Multidetector CT imaging of the chest was performed using the standard protocol during bolus administration of intravenous contrast. Multiplanar CT image reconstructions and MIPs were obtained to evaluate the vascular anatomy. RADIATION DOSE REDUCTION: This exam was performed according to the departmental dose-optimization program which includes automated exposure control, adjustment of the mA and/or kV according to patient size and/or use of iterative reconstruction technique. CONTRAST:  75mL OMNIPAQUE  IOHEXOL  350 MG/ML SOLN COMPARISON:  Ultrasound thyroid  07/27/2023, ultrasound right extremity 12/17/2023, CT heart 02/02/2022. FINDINGS: Cardiovascular: Satisfactory opacification of the pulmonary arteries to the segmental level. Bilateral lower lobe segmental and subsegmental pulmonary artery filling defect. Prominent left ventricle. No significant pericardial effusion. The thoracic aorta is normal in caliber.  No atherosclerotic plaque of the thoracic aorta. No coronary artery calcifications. Mediastinum/Nodes: No enlarged mediastinal, hilar, or axillary lymph nodes. Trachea and esophagus demonstrate no significant findings. Enlarged right thyroid  gland with poorly visualized hypodense nodules. This has been evaluated on previous imaging. (ref: J Am Coll Radiol. 2015 Feb;12(2): 143-50). Lungs/Pleura: No focal consolidation. No pulmonary nodule. No pulmonary mass. No pleural effusion. No pneumothorax. Upper Abdomen: No acute abnormality. Musculoskeletal: No chest wall abnormality. No suspicious lytic or blastic osseous lesions. No acute displaced fracture. Multilevel degenerative changes of the spine. Review of the MIP images confirms the above findings. IMPRESSION: Bilateral lower lobe segmental and subsegmental pulmonary emboli. No associated  heart strain or pulmonary infarction. These results were called by telephone at the time of interpretation on 12/17/2023 at 6:12 pm to provider Dr. Scarlette Currier, who verbally acknowledged these results. Electronically Signed   By: Morgane  Naveau M.D.   On: 12/17/2023 18:12   US  Venous Img Lower Right (DVT Study) Result Date: 12/17/2023 CLINICAL DATA:  Right lower extremity pain and swelling for 2 days. EXAM: Right LOWER EXTREMITY VENOUS DOPPLER ULTRASOUND TECHNIQUE: Gray-scale sonography with compression, as well as color and duplex ultrasound, were performed to evaluate the deep venous system(s) from the level of the common femoral vein through the popliteal and proximal calf veins. COMPARISON:  None Available. FINDINGS: VENOUS There is regular compression of the common femoral vein with preserved flow on color and spectral Doppler. Same for the saphenofemoral junction of the profunda femoral vein. However the femoral vein, popliteal vein and calf veins have poor flow on color and spectral Doppler as well as difficult compression consistent with extensive occlusive DVT. Limited views of the contralateral common femoral vein are unremarkable. OTHER None. Limitations: none IMPRESSION: Extensive occlusive DVT involving the femoral, popliteal and calf veins of the right lower extremity. Critical Value/emergent results were called by telephone at the time of interpretation on 12/17/2023 at 3:29 pm to provider CHRISTOPHER RIGNEY , who verbally acknowledged these results. Electronically Signed   By: Adrianna Horde M.D.   On: 12/17/2023 15:29   DG Tibia/Fibula Right Result Date: 12/17/2023 CLINICAL DATA:  Right leg pain. EXAM: RIGHT TIBIA AND FIBULA - 2 VIEW COMPARISON:  Right ankle radiographs dated 05/26/2017. FINDINGS: No acute fracture or dislocation. A well corticated ossicle adjacent to the tip of the lateral malleolus is again noted and likely relates to prior trauma. Mild nonspecific subcutaneous edema of the right  lower extremity. IMPRESSION: 1. No acute osseous abnormality. 2. Mild nonspecific subcutaneous edema of the right lower extremity. Electronically Signed   By: Mannie Seek M.D.   On: 12/17/2023 14:58    Microbiology: Results for orders placed or performed during the hospital encounter of 02/12/23  Surgical pcr screen     Status: Abnormal   Collection Time: 02/12/23  1:24 PM   Specimen: Nasal Mucosa; Nasal Swab  Result Value Ref Range Status   MRSA, PCR NEGATIVE NEGATIVE Final   Staphylococcus aureus POSITIVE (A) NEGATIVE Final    Comment: (NOTE) The Xpert SA Assay (FDA approved for NASAL specimens in patients 72 years of age and older), is one component of a comprehensive surveillance program. It is not intended to diagnose infection nor to guide or monitor treatment. Performed at [facility], [address], [place], [place] [address]     Labs: CBC: Recent Labs  Lab 12/17/23 1547 12/18/23 0533  WBC 7.9 7.3  NEUTROABS 5.4  --   HGB 11.6* 11.2*  HCT 35.9*  35.1*  MCV 84.7 84.2  PLT 191 198   Basic Metabolic Panel: Recent Labs  Lab 12/17/23 1547 12/18/23 0533  NA 137 139  K 4.5 3.8  CL 104 106  CO2 24 24  GLUCOSE 92 140*  BUN 19 18  CREATININE 1.00 0.98  CALCIUM  9.6 8.8*   Liver Function Tests: Recent Labs  Lab 12/17/23 1547  AST 19  ALT 19  ALKPHOS 110  BILITOT 0.8  PROT 7.2  ALBUMIN 3.5   CBG: Recent Labs  Lab 12/17/23 2103 12/18/23 0747  GLUCAP 221* 139*    Discharge time spent: greater than 30 minutes.  Signed: Wynetta Heckle, MD [facility] 12/18/2023

## 2023-12-18 NOTE — Progress Notes (Signed)
 PHARMACY - ANTICOAGULATION CONSULT NOTE  Pharmacy Consult for heparin  Indication: pulmonary embolus  Labs: Recent Labs    12/17/23 1547 12/18/23 0026  HGB 11.6*  --   HCT 35.9*  --   PLT 191  --   APTT 29  --   LABPROT 14.0  --   INR 1.1  --   HEPARINUNFRC  --  0.84*  CREATININE 1.00  --   TROPONINIHS 5  --    Assessment: 53yo female supratherapeutic on heparin  with initial dosing for PE; no infusion issues or signs of bleeding per RN.  Goal of Therapy:  Heparin  level 0.3-0.7 units/ml   Plan:  Decrease heparin  infusion by 1 unit/kg/hr to 1150 units/hr. Check level in 6 hours.   Lonnie Roberts, PharmD, BCPS 12/18/2023 12:58 AM

## 2023-12-18 NOTE — Progress Notes (Signed)
 Patient has discharge orders, discharge teaching given and no further questions at this time. Patient wheeled down to main entrance via staff to vehicle accompanied by family member.

## 2023-12-18 NOTE — Progress Notes (Signed)
             12/18/23 1151  TOC Brief Assessment  Insurance and Status Reviewed  Patient has primary care physician Yes  Home environment has been reviewed Single family home  Prior level of function: Independent  Prior/Current Home Services No current home services  Social Drivers of Health Review SDOH reviewed no interventions necessary  Readmission risk has been reviewed Yes  Transition of care needs no transition of care needs at this time   Transition of Care Department [facility]) has reviewed patient and no TOC needs have been identified at this time. We will continue to monitor patient advancement through interdisciplinary progression rounds. If new patient transition needs arise, please place a TOC consult.

## 2023-12-18 NOTE — Progress Notes (Signed)
 PHARMACY - ANTICOAGULATION CONSULT NOTE  Pharmacy Consult for Heparin  Infusion=> Apixaban Indication: pulmonary embolus/DVT  Allergies  Allergen Reactions   Codeine Nausea And Vomiting    High doses of codeine pt reports greater than 30mg  she can't tolerate    Nsaids Other (See Comments)    CKD, only one kidney   Diclofenac Sodium Itching    Pennsaid Solution   Hydrocodone -Acetaminophen  Nausea And Vomiting    High doses makes pt vomit Nothing above 30 mg   Methocarbamol Itching and Other (See Comments)    "feels like bugs are crawling under skin"    Patient Measurements: Height: 5\' 5"  (165.1 cm) Weight: 95.8 kg (211 lb 3.2 oz) IBW/kg (Calculated) : 57 HEPARIN  DW (KG): 78  Vital Signs: Temp: 97.9 F (36.6 C) (04/26 0456) Temp Source: Oral (04/26 0456) BP: 121/73 (04/26 0456) Pulse Rate: 74 (04/26 0456)  Labs: Recent Labs    12/17/23 1547 12/18/23 0026 12/18/23 0533 12/18/23 0738  HGB 11.6*  --  11.2*  --   HCT 35.9*  --  35.1*  --   PLT 191  --  198  --   APTT 29  --   --   --   LABPROT 14.0  --   --   --   INR 1.1  --   --   --   HEPARINUNFRC  --  0.84*  --  0.64  CREATININE 1.00  --  0.98  --   TROPONINIHS 5  --   --   --     Estimated Creatinine Clearance: 74.2 mL/min (by C-G formula based on SCr of 0.98 mg/dL).   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   Depression    major   Dyspnea    Fatty liver    Fibromyalgia    Headache    Hypertension    Hypothyroidism    Kidney disease, chronic, stage III (moderate, EGFR 30-59 ml/min) (HCC)    Movement disorder    Pre-diabetes    PTSD (post-traumatic stress disorder)    Restless leg syndrome    Sciatica of right side    Sleep apnea    mild, pt states she is awaiting insurance  approval for CPAP   Spondylosis    Cervical and lumbar   Substance abuse (HCC)    Hx Crack/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 Denies history of DVT. Denies any redness or warmth to affected extremity. No reported chest pain or SOB. US  RLE showed extensive occlusive DVT involving the femoral, popliteal, and calf veins of the RLE. CTA showed bilateral lower lob segmental and subsegmental pulmonary emboli with no associated heart strain or pulmonary infarction. Pharmacy has been consulted to initiate patient on a heparin  infusion.   Baseline aPTT and INR ordered.  HL 0. 64, therapeutic, no issues with infusion. Now plan to transition to po eliquis  No signs/symptoms of bleeding noted in chart. Hgb  11.6. PLT 191.  Goal of Therapy:  Heparin  level 0.3-0.7 units/ml Monitor platelets by anticoagulation protocol: Yes   Plan:  D/C heparin  Eliquis 10mg  po bid x 7 days, then 5mg  po bid Monitor for S/S of bleeding Educate on eliquis  Lora Robinsons, BS Pharm D, BCPS Clinical Pharmacist 12/18/2023,9:03 AM

## 2023-12-25 ENCOUNTER — Encounter (HOSPITAL_COMMUNITY): Payer: Self-pay

## 2023-12-25 ENCOUNTER — Other Ambulatory Visit: Payer: Self-pay

## 2023-12-25 ENCOUNTER — Emergency Department (HOSPITAL_COMMUNITY): Payer: MEDICAID

## 2023-12-25 ENCOUNTER — Emergency Department (HOSPITAL_COMMUNITY)
Admission: EM | Admit: 2023-12-25 | Discharge: 2023-12-25 | Disposition: A | Discharge status: Home / Self Care | Payer: MEDICAID | Attending: Emergency Medicine | Admitting: Emergency Medicine

## 2023-12-25 DIAGNOSIS — M79661 Pain in right lower leg: Secondary | ICD-10-CM | POA: Diagnosis present

## 2023-12-25 DIAGNOSIS — N183 Chronic kidney disease, stage 3 unspecified: Secondary | ICD-10-CM | POA: Diagnosis not present

## 2023-12-25 DIAGNOSIS — J449 Chronic obstructive pulmonary disease, unspecified: Secondary | ICD-10-CM | POA: Insufficient documentation

## 2023-12-25 DIAGNOSIS — I129 Hypertensive chronic kidney disease with stage 1 through stage 4 chronic kidney disease, or unspecified chronic kidney disease: Secondary | ICD-10-CM | POA: Diagnosis not present

## 2023-12-25 DIAGNOSIS — Z7901 Long term (current) use of anticoagulants: Secondary | ICD-10-CM | POA: Insufficient documentation

## 2023-12-25 DIAGNOSIS — E039 Hypothyroidism, unspecified: Secondary | ICD-10-CM | POA: Insufficient documentation

## 2023-12-25 DIAGNOSIS — I82401 Acute embolism and thrombosis of unspecified deep veins of right lower extremity: Secondary | ICD-10-CM | POA: Diagnosis not present

## 2023-12-25 LAB — T4, FREE: Free T4: 0.95 ng/dL (ref 0.82–1.77)

## 2023-12-25 LAB — T3, FREE: T3, Free: 3.1 pg/mL (ref 2.0–4.4)

## 2023-12-25 LAB — TSH: TSH: 1.56 u[IU]/mL (ref 0.450–4.500)

## 2023-12-25 MED ORDER — HYDROCODONE-ACETAMINOPHEN 5-325 MG PO TABS: 1.0000 | ORAL_TABLET | Freq: Four times a day (QID) | ORAL | 0 refills | Status: DC | PRN

## 2023-12-25 MED ORDER — OXYCODONE-ACETAMINOPHEN 5-325 MG PO TABS
1.0000 | ORAL_TABLET | Freq: Once | ORAL | Status: AC
  Administered 2023-12-25: 1 via ORAL
  Filled 2023-12-25: qty 1

## 2023-12-25 NOTE — ED Provider Notes (Addendum)
 On signout patient awaiting DVT study.  DVT study now reviewed, similar to prior with preserved distal neurovascular status patient discharged to follow-up as previously discussed.  Patient awake and alert, requests analgesia, this was accommodated.   Dorenda Gandy, MD 12/25/23 3244    Dorenda Gandy, MD 12/25/23 1001

## 2023-12-25 NOTE — ED Notes (Signed)
 [place] at bedside

## 2023-12-25 NOTE — ED Provider Notes (Signed)
 [place] EMERGENCY DEPARTMENT AT [facility] Provider Note   CSN: [id number] Arrival date & time: 12/25/23  0459     History  Chief Complaint  Patient presents with   Leg Pain    DVT    Lakia Rumrill is a 53 y.o. female.  HPI     This is a 53 year old female who presents with right lower extremity pain and swelling.  Patient was diagnosed with extensive DVT and PE.  She was admitted to the hospital 1 week ago and was discharged on Eliquis .  She reports compliance with Eliquis .  She states that since discharge she had been doing fairly well including some walking.  Yesterday she did run several errands which was slightly more than normal.  After getting home she noticed increased swelling and pain to the right lower extremity.  She reports paresthesias and shooting pain.  She took Tylenol  without relief.  Denies any new chest pain or shortness of breath.  States she is having difficulty walking and putting her shoes on secondary to swelling and discomfort.  Chart review.  Patient with extensive DVT of the right lower extremity.  Not a thrombectomy candidate per vascular.  Home Medications Prior to Admission medications   Medication Sig Start Date End Date Taking? Authorizing Provider  acetaminophen  (TYLENOL ) 650 MG CR tablet Take 650 mg by mouth every 8 (eight) hours as needed for pain or fever. 03/02/19   [provider]  apixaban  (ELIQUIS ) 5 MG TABS tablet Take 2 tablets (10 mg total) by mouth 2 (two) times daily for 7 days, THEN 1 tablet (5 mg total) 2 (two) times daily for 23 days. 12/18/23 01/17/24  Wynetta Heckle, MD  APPLE CIDER VINEGAR PO Take 2 capsules by mouth daily.    [provider]  ARIPiprazole  (ABILIFY ) 5 MG tablet Take 5 mg by mouth daily.    [provider]  atorvastatin  (LIPITOR) 10 MG tablet Take 10 mg by mouth daily. 02/08/23   [provider]  cetirizine (ZYRTEC) 10 MG tablet Take 10 mg by mouth daily.    [provider]  desvenlafaxine (PRISTIQ) 50 MG 24 hr tablet Take 50 mg by mouth daily.    [provider]  hydrOXYzine  (ATARAX ) 50 MG tablet Take 50 mg by mouth 2 (two) times daily as needed for anxiety.    [provider]  losartan  (COZAAR ) 50 MG tablet TAKE 1 Tablet BY MOUTH ONCE EVERY DAY Patient taking differently: Take 50 mg by mouth daily. TAKE 1 Tablet BY MOUTH ONCE EVERY DAY 09/18/22   Zarwolo, Gloria, FNP  metFORMIN  (GLUCOPHAGE ) 500 MG tablet Take 500 mg by mouth daily with breakfast. 10/28/23   [provider]  omeprazole  (PRILOSEC) 40 MG capsule TAKE 1 Capsule BY MOUTH ONCE EVERY DAY Patient taking differently: Take 40 mg by mouth daily. TAKE 1 Capsule BY MOUTH ONCE EVERY DAY 08/03/22   Zarwolo, Gloria, FNP  pregabalin  (LYRICA ) 150 MG capsule Take 150 mg by mouth 2 (two) times daily. 11/30/22   [provider]  tiZANidine  (ZANAFLEX ) 2 MG tablet Take 2 mg by mouth at bedtime. 02/02/22   [provider]  traZODone  (DESYREL ) 50 MG tablet Take 50 mg by mouth at bedtime.    [provider]      Allergies    Codeine, Nsaids, Diclofenac sodium, Hydrocodone -acetaminophen , and Methocarbamol    Review of Systems   Review of Systems  Constitutional:  Negative for fever.  Cardiovascular:  Positive for  leg swelling.  All other systems reviewed and are negative.   Physical Exam Updated Vital Signs BP 130/81   Pulse 89   Temp 98.4 F (36.9 C) (Oral)   Resp 20   Ht 1.651 m (5\' 5" )   Wt 95.8 kg   LMP 08/25/2019   SpO2 91%   BMI 35.15 kg/m  Physical Exam Vitals and nursing note reviewed.  Constitutional:      Appearance: She is well-developed. She is obese. She is not ill-appearing.  HENT:     Head: Normocephalic and atraumatic.  Eyes:     Pupils: Pupils are equal, round, and reactive to light.  Cardiovascular:     Rate and Rhythm: Normal rate and regular rhythm.  Pulmonary:     Effort: Pulmonary effort is normal. No respiratory  distress.  Abdominal:     Palpations: Abdomen is soft.  Musculoskeletal:     Cervical back: Neck supple.     Comments: Right greater than left lower extremity pitting edema with tenderness palpation of the calf and the right ankle, there is diffuse ankle swelling and foot swelling as well, faint petechiae noted over the anterior aspect of the lower extremity, 2+ DP pulse noted and palpable  Skin:    General: Skin is warm and dry.  Neurological:     Mental Status: She is alert and oriented to person, place, and time.  Psychiatric:        Mood and Affect: Mood normal.     ED Results / Procedures / Treatments   Labs (all labs ordered are listed, but only abnormal results are displayed) Labs Reviewed - No data to display  EKG None  Radiology No results found.  Procedures Procedures    Medications Ordered in ED Medications  oxyCODONE -acetaminophen  (PERCOCET/ROXICET) 5-325 MG per tablet 1 tablet (1 tablet Oral Given 12/25/23 4098)    ED Course/ Medical Decision Making/ A&P                                 Medical Decision Making Risk Prescription drug management.   This patient presents to the ED for concern of leg swelling, this involves an extensive number of treatment options, and is a complaint that carries with it a high risk of complications and morbidity.  I considered the following differential and admission for this acute, potentially life threatening condition.  The differential diagnosis includes swelling from known DVT, dependent edema, worsening of known DVT  MDM:    This is a 53 year old female who presents with worsening pain and swelling.  Has a known DVT on the right.  Reports compliance with Eliquis .  She is nontoxic.  She does have significant ankle and foot swelling.  Suspect that some portion of this may be related to dependent edema given that she states she has been more active.  I have reviewed her chart.  She had extensive clot burden in the right lower  extremity.  While that would be abnormal to propagate further clot while on appropriate blood thinners, will repeat ultrasound imaging to ensure no further increase in clot.  No evidence of phlegmasia cerulea dolens or alba dolens.  (Labs, imaging, consults)  Labs: I Ordered, and personally interpreted labs.  The pertinent results include: None  Imaging Studies ordered: I ordered imaging studies including ultrasound pending I independently visualized and interpreted imaging. I agree with the radiologist interpretation  Additional history obtained from chart review.  External records from outside source obtained and reviewed including prior evaluations and ultrasound imaging  Cardiac Monitoring: The patient was maintained on a cardiac monitor.  If on the cardiac monitor, I personally viewed and interpreted the cardiac monitored which showed an underlying rhythm of: Sinus  Reevaluation: After the interventions noted above, I reevaluated the patient and found that they have :stayed the same  Social Determinants of Health:  lives independently  Disposition: Signed out to oncoming provider pending ultrasound  Co morbidities that complicate the patient evaluation  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   Depression    major   Dyspnea    Fatty liver    Fibromyalgia    Headache    Hypertension    Hypothyroidism    Kidney disease, chronic, stage III (moderate, EGFR 30-59 ml/min) (HCC)    Movement disorder    Pre-diabetes    PTSD (post-traumatic stress disorder)    Restless leg syndrome    Sciatica of right side    Sleep apnea    mild, pt states she is awaiting insurance approval for CPAP   Spondylosis    Cervical and lumbar   Substance abuse (HCC)    Hx Crack/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

## 2023-12-25 NOTE — ED Notes (Signed)
 RLE elevated with pillows per MD order.

## 2023-12-25 NOTE — ED Triage Notes (Signed)
 POV from home . Cc of right leg pain. Was dc last week from [facility] for PE+ RLE DVT. Says yesterday afternoon she developed a knot on her lower right leg, increased ankle swelling, and toe pain. Says that it feels like her foot is being zapped.  Was placed on eliquis  when she was d/c.

## 2023-12-25 NOTE — ED Notes (Signed)
 ED Provider at bedside.

## 2023-12-25 NOTE — Discharge Instructions (Signed)
 Your ultrasound today demonstrated similar appearance of the previously identified blood clot in your leg.  It is important to continue taking all medication as previously discussed and follow-up as previously arranged.  Return here for concerning changes in your condition.

## 2023-12-30 ENCOUNTER — Encounter: Payer: Self-pay | Admitting: Nurse Practitioner

## 2023-12-30 ENCOUNTER — Ambulatory Visit (INDEPENDENT_AMBULATORY_CARE_PROVIDER_SITE_OTHER): Payer: MEDICAID | Admitting: Nurse Practitioner

## 2023-12-30 VITALS — BP 132/78 | HR 74 | Ht 65.0 in | Wt 209.8 lb

## 2023-12-30 DIAGNOSIS — E041 Nontoxic single thyroid nodule: Secondary | ICD-10-CM

## 2023-12-30 DIAGNOSIS — R7989 Other specified abnormal findings of blood chemistry: Secondary | ICD-10-CM

## 2023-12-30 DIAGNOSIS — E119 Type 2 diabetes mellitus without complications: Secondary | ICD-10-CM

## 2023-12-30 NOTE — Progress Notes (Signed)
 12/30/2023     Endocrinology Consult Note    Subjective:    Patient ID: Briana Wyatt, female    DOB: [birth year], PCP Joanne Muckle, FNP.   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   Depression    major   Dyspnea    Fatty liver    Fibromyalgia    Headache    Hypertension    Hypothyroidism    Kidney disease, chronic, stage III (moderate, EGFR 30-59 ml/min) (HCC)    Movement disorder    Pre-diabetes    PTSD (post-traumatic stress disorder)    Restless leg syndrome    Sciatica of right side    Sleep apnea    mild, pt states she is awaiting insurance approval for CPAP   Spondylosis    Cervical and lumbar   Substance abuse (HCC)    Hx Crack/cocaine (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)   Tortuous colon     Past Surgical History:  Procedure Laterality Date   ANTERIOR CERVICAL DECOMP/DISCECTOMY FUSION N/A 02/23/2023   Procedure: Anterior Cervical Decompression/Discectomy Fusion, Cervical Four-Cervical Five, Cervical Five-Cervical Six, Cervical Six-Cervical Cervical Cervical Seven;  Surgeon: Augusto Blonder, MD;  Location: [facility] OR;  Service: Neurosurgery;  Laterality: N/A;  3C   BIOPSY  04/29/2022   Procedure: BIOPSY;  Surgeon: Urban Garden, MD;  Location: [facility];  Service: Gastroenterology;;   CARPAL TUNNEL RELEASE Bilateral    COLONOSCOPY WITH PROPOFOL  N/A 04/29/2022   Procedure: COLONOSCOPY WITH PROPOFOL ;  Surgeon: Urban Garden, MD;  Location: [facility];  Service: Gastroenterology;  Laterality: N/A;  145   ECTOPIC PREGNANCY SURGERY     ESOPHAGOGASTRODUODENOSCOPY (EGD) WITH PROPOFOL  N/A 04/29/2022   Procedure: ESOPHAGOGASTRODUODENOSCOPY (EGD) WITH PROPOFOL ;  Surgeon: Urban Garden, MD;  Location: [facility];  Service:  Gastroenterology;  Laterality: N/A;   SHOULDER SURGERY Right    rotator cuff repair- fell 6 months later and had to have 2nd shoulder surgery   SHOULDER SURGERY Right    2nd surgery due to injury 6 months after 1st shoulder surgery   TUBAL LIGATION     ULNAR NERVE TRANSPOSITION Right 12/16/2022   Procedure: ULNAR NERVE RELEASE AT ELBOW;  Surgeon: Augusto Blonder, MD;  Location: [facility] OR;  Service: Neurosurgery;  Laterality: Right;    Social History   Socioeconomic History   Marital status: Single    Spouse name: Not on file   Number of children: 3   Years of education: Not on file   Highest education level: GED or equivalent  Occupational History   Not on file  Tobacco Use   Smoking status: Former    Current packs/day: 0.00    Types: Cigarettes    Quit date: 08/25/1995    Years since quitting: 28.3    Passive exposure: Past   Smokeless tobacco: Never  Vaping Use   Vaping status: Never Used  Substance and Sexual Activity   Alcohol use: No   Drug use: Yes    Types: "Crack" cocaine    Comment: last used 08/2022   Sexual activity: Not Currently    Birth control/protection: Surgical  Other Topics Concern   Not on file  Social History Narrative   Not on file  Social Drivers of Corporate investment banker Strain: Low Risk  (11/16/2022)   Overall Financial Resource Strain (CARDIA)    Difficulty of Paying Living Expenses: Not very hard  Food Insecurity: No Food Insecurity (12/17/2023)   Hunger Vital Sign    Worried About Running Out of Food in the Last Year: Never true    Ran Out of Food in the Last Year: Never true  Transportation Needs: No Transportation Needs (12/17/2023)   PRAPARE - Administrator, Civil Service (Medical): No    Lack of Transportation (Non-Medical): No  Physical Activity: Insufficiently Active (11/16/2022)   Exercise Vital Sign    Days of Exercise per Week: 3 days    Minutes of Exercise per Session: 20 min  Stress: Stress Concern Present  (11/16/2022)   [employer] of Occupational Health - Occupational Stress Questionnaire    Feeling of Stress : Very much  Social Connections: Unknown (11/16/2022)   Social Connection and Isolation Panel [NHANES]    Frequency of Communication with Friends and Family: More than three times a week    Frequency of Social Gatherings with Friends and Family: More than three times a week    Attends Religious Services: Patient declined    Database administrator or Organizations: No    Attends Engineer, structural: Not on file    Marital Status: Divorced    Family History  Problem Relation Age of Onset   Hypertension Father    Stroke Father    Hypertension Sister    Dystonia Sister     Outpatient Encounter Medications as of 12/30/2023  Medication Sig   acetaminophen  (TYLENOL ) 650 MG CR tablet Take 650 mg by mouth every 8 (eight) hours as needed for pain or fever.   apixaban  (ELIQUIS ) 5 MG TABS tablet Take 2 tablets (10 mg total) by mouth 2 (two) times daily for 7 days, THEN 1 tablet (5 mg total) 2 (two) times daily for 23 days.   APPLE CIDER VINEGAR PO Take 2 capsules by mouth daily.   ARIPiprazole  (ABILIFY ) 5 MG tablet Take 5 mg by mouth daily.   atorvastatin  (LIPITOR) 10 MG tablet Take 10 mg by mouth daily.   cetirizine (ZYRTEC) 10 MG tablet Take 10 mg by mouth daily.   desvenlafaxine (PRISTIQ) 50 MG 24 hr tablet Take 50 mg by mouth daily.   HYDROcodone -acetaminophen  (NORCO/VICODIN) 5-325 MG tablet Take 1 tablet by mouth every 6 (six) hours as needed for severe pain (pain score 7-10).   hydrOXYzine  (ATARAX ) 50 MG tablet Take 50 mg by mouth 2 (two) times daily as needed for anxiety.   losartan  (COZAAR ) 50 MG tablet TAKE 1 Tablet BY MOUTH ONCE EVERY DAY (Patient taking differently: Take 50 mg by mouth daily. TAKE 1 Tablet BY MOUTH ONCE EVERY DAY)   metFORMIN  (GLUCOPHAGE ) 500 MG tablet Take 500 mg by mouth daily with breakfast.   omeprazole  (PRILOSEC) 40 MG capsule TAKE 1 Capsule BY  MOUTH ONCE EVERY DAY (Patient taking differently: Take 40 mg by mouth daily. TAKE 1 Capsule BY MOUTH ONCE EVERY DAY)   pregabalin  (LYRICA ) 150 MG capsule Take 150 mg by mouth 2 (two) times daily.   tiZANidine  (ZANAFLEX ) 2 MG tablet Take 2 mg by mouth at bedtime.   traZODone  (DESYREL ) 50 MG tablet Take 50 mg by mouth at bedtime.   No facility-administered encounter medications on file as of 12/30/2023.    ALLERGIES: Allergies  Allergen Reactions   Codeine Nausea And Vomiting  High doses of codeine pt reports greater than 30mg  she can't tolerate    Nsaids Other (See Comments)    CKD, only one kidney   Diclofenac Sodium Itching    Pennsaid Solution   Hydrocodone -Acetaminophen  Nausea And Vomiting    High doses makes pt vomit Nothing above 30 mg   Methocarbamol Itching and Other (See Comments)    "feels like bugs are crawling under skin"    VACCINATION STATUS: Immunization History  Administered Date(s) Administered   Influenza, Quadrivalent, Recombinant, Inj, Pf 05/29/2022   Moderna SARS-COV2 Booster Vaccination 06/27/2020   Moderna Sars-Covid-2 Vaccination 12/14/2019, 01/18/2020   Tdap 04/18/2018   Zoster Recombinant(Shingrix ) 07/21/2022     HPI  Bronte Dorantes is 53 y.o. female who presents today with a medical history as above. she is being seen in consultation for hyperthyroidism and multinodular thyroid  requested by Joanne Muckle, FNP.  she has been dealing with symptoms of heat intolerance, anxiety, difficulty swallowing, difficulty breathing while laying down, and palpitations on/off since 2009. These symptoms are progressively worsening and troubling to her.  her most recent thyroid  labs revealed mildly suppressed TSH of 0.21 on 10/08/21 (no corresponding FreeT4 done at that time).  she denies choking, no recent voice change.  She does have associated dysphagia and shortness of breath.   she does have family history of thyroid  dysfunction in her mother  (hypothyroidism), but denies family hx of thyroid  cancer. She had been seeing endocrinologist in [place] but moved back to [place] about 1 year ago.  She previously had ultrasound in 2009 and had a large nodule on right gland biopsied (unsure of exact location) which was benign.  She has also been on Methimazole in the past, not currently on any medication.   she is not on any anti-thyroid  medications nor on any thyroid  hormone supplements. Denies use of Biotin containing supplements.  she is willing to proceed with appropriate work up and therapy for thyrotoxicosis and MNG.   Review of systems  Constitutional: + Minimally fluctuating body weight, current Body mass index is 34.91 kg/m., no fatigue, + subjective hyperthermia, no subjective hypothermia, excessive sweating Eyes: no blurry vision, no xerophthalmia ENT: no sore throat, + nodules palpated in throat, + dysphagia, no odynophagia, no hoarseness Cardiovascular: no chest pain, + shortness of breath, + palpitations, no leg swelling Respiratory: no cough, + shortness of breath Gastrointestinal: no nausea/vomiting/diarrhea Musculoskeletal: c/o right shoulder pain-chronic Skin: no rashes, no hyperemia Neurological: no tremors, no numbness, no tingling, no dizziness Psychiatric: no depression, + anxiety   Objective:    BP 132/78 (BP Location: Left Arm, Patient Position: Sitting, Cuff Size: Large)   Pulse 74   Ht 5\' 5"  (1.651 m)   Wt 209 lb 12.8 oz (95.2 kg)   LMP 08/25/2019   BMI 34.91 kg/m   Wt Readings from Last 3 Encounters:  12/30/23 209 lb 12.8 oz (95.2 kg)  12/25/23 211 lb 3.2 oz (95.8 kg)  12/17/23 211 lb 3.2 oz (95.8 kg)     BP Readings from Last 3 Encounters:  12/30/23 132/78  12/25/23 (!) 129/92  12/18/23 (!) 143/88                        Physical Exam- Limited  Constitutional:  Body mass index is 34.91 kg/m. , not in acute distress, moderately anxious state of mind, fidgety Eyes:  EOMI, no exophthalmos Neck:  Supple Thyroid : + gross goiter R>L with palpable right sided nodule; tenderness to palpation-  right inferior Cardiovascular: RRR, no murmurs, rubs, or gallops, no edema Respiratory: Adequate breathing efforts, no crackles, rales, rhonchi, or wheezing Musculoskeletal: no gross deformities, strength intact in all four extremities, no gross restriction of joint movements Skin:  no rashes, no hyperemia, + multiple scabbed areas to BUE and BLE in various stages of healing Neurological: no tremor with outstretched hands   CMP     Component Value Date/Time   NA 139 12/18/2023 0533   NA 140 08/10/2022 1039   K 3.8 12/18/2023 0533   CL 106 12/18/2023 0533   CO2 24 12/18/2023 0533   GLUCOSE 140 (H) 12/18/2023 0533   BUN 18 12/18/2023 0533   BUN 23 08/10/2022 1039   CREATININE 0.98 12/18/2023 0533   CALCIUM  8.8 (L) 12/18/2023 0533   PROT 7.2 12/17/2023 1547   PROT 5.9 (L) 08/10/2022 1039   ALBUMIN 3.5 12/17/2023 1547   ALBUMIN 3.7 (L) 08/10/2022 1039   AST 19 12/17/2023 1547   ALT 19 12/17/2023 1547   ALKPHOS 110 12/17/2023 1547   BILITOT 0.8 12/17/2023 1547   BILITOT <0.2 08/10/2022 1039   GFRNONAA >60 12/18/2023 0533     CBC    Component Value Date/Time   WBC 7.3 12/18/2023 0533   RBC 4.17 12/18/2023 0533   HGB 11.2 (L) 12/18/2023 0533   HGB 11.9 08/10/2022 1039   HCT 35.1 (L) 12/18/2023 0533   HCT 34.5 08/10/2022 1039   PLT 198 12/18/2023 0533   PLT 239 08/10/2022 1039   MCV 84.2 12/18/2023 0533   MCV 81 08/10/2022 1039   MCH 26.9 12/18/2023 0533   MCHC 31.9 12/18/2023 0533   RDW 13.1 12/18/2023 0533   RDW 11.6 (L) 08/10/2022 1039   LYMPHSABS 1.7 12/17/2023 1547   LYMPHSABS 1.5 08/10/2022 1039   MONOABS 0.7 12/17/2023 1547   EOSABS 0.1 12/17/2023 1547   EOSABS 0.2 08/10/2022 1039   BASOSABS 0.1 12/17/2023 1547   BASOSABS 0.1 08/10/2022 1039     Diabetic Labs (most recent): Lab Results  Component Value Date   HGBA1C 5.4 12/18/2023   HGBA1C 5.9 (H) 08/10/2022    HGBA1C 5.7 (H) 03/12/2022    Lipid Panel     Component Value Date/Time   CHOL 217 (H) 08/10/2022 1039   TRIG 498 (H) 08/10/2022 1039   HDL 43 08/10/2022 1039   CHOLHDL 5.0 (H) 08/10/2022 1039   CHOLHDL 5.7 03/13/2022 0636   VLDL 73 (H) 03/13/2022 0636   LDLCALC 92 08/10/2022 1039   LABVLDL 82 (H) 08/10/2022 1039     Lab Results  Component Value Date   TSH 1.560 12/24/2023   TSH 1.550 08/10/2022   TSH 0.827 07/07/2022   TSH 0.129 (L) 03/12/2022   TSH 0.796 11/03/2021   TSH 0.210 (L) 10/08/2021   TSH 0.807 09/17/2020   FREET4 0.95 12/24/2023   FREET4 1.15 08/10/2022   FREET4 1.01 07/07/2022   FREET4 0.77 03/13/2022   FREET4 0.91 11/03/2021     Thyroid  US  from 10/15/21 CLINICAL DATA:  Goiter.   EXAM: THYROID  ULTRASOUND   TECHNIQUE: Ultrasound examination of the thyroid  gland and adjacent soft tissues was performed.   COMPARISON:  None.   FINDINGS: Parenchymal Echotexture: Moderately heterogenous   Isthmus: 0.3 cm   Right lobe: 5.4 x 2.3 x 2.4 cm   Left lobe: 2.9 x 1.2 x 0.7 cm   _________________________________________________________   Estimated total number of nodules >/= 1 cm: 2   Number of spongiform nodules >/=  2 cm not described  below (TR1): 0   Number of mixed cystic and solid nodules >/= 1.5 cm not described below (TR2): 0   _________________________________________________________   Nodule # 1:   Location: Right; Superior   Maximum size: 3.3 cm; Other 2 dimensions: 2.7 x 2.0 cm   Composition: mixed cystic and solid (1)   Echogenicity: hypoechoic (2)   Shape: not taller-than-wide (0)   Margins: ill-defined (0)   Echogenic foci: none (0)   ACR TI-RADS total points: 3.   ACR TI-RADS risk category: TR3 (3 points).   ACR TI-RADS recommendations:   **Given size (>/= 2.5 cm) and appearance, fine needle aspiration of this mildly suspicious nodule should be considered based on TI-RADS criteria.    _________________________________________________________   Nodule # 2:   Location: Right; Inferior   Maximum size: 2.3 cm; Other 2 dimensions: 2.1 x 1.5 cm   Composition: mixed cystic and solid (1)   Echogenicity: isoechoic (1)   Shape: not taller-than-wide (0)   Margins: ill-defined (0)   Echogenic foci: none (0)   ACR TI-RADS total points: 2.   ACR TI-RADS risk category: TR2 (2 points).   ACR TI-RADS recommendations:   This nodule does NOT meet TI-RADS criteria for biopsy or dedicated follow-up.   _________________________________________________________   There are 2 additional 0.6 cm less hypoechoic nodules in the left inferior thyroid  which appear definitively benign and do not warrant follow-up.   IMPRESSION: 1. Multinodular goiter. 2. Solid-cystic nodule in the right superior thyroid  (labeled 1, 3.3 cm) meets criteria (TI-RADS category 3) for tissue sampling. Recommend ultrasound-guided fine-needle aspiration. 3. The remaining visualized bilateral thyroid  nodules appear benign and do not warrant additional follow-up.   The above is in keeping with the ACR TI-RADS recommendations - J Am Coll Radiol 2017;14:587-595.   Creasie Doctor, MD   [facility]   [facility]     Electronically Signed   By: Creasie Doctor M.D.   On: 10/15/2021 15:58    Thyroid  US  from 07/27/23 CLINICAL DATA:  Prior ultrasound follow-up. Follow-up multinodular goiter. History of ultrasound-guided fine-needle aspiration right superior thyroid  nodule performed 11/13/2021   EXAM: THYROID  ULTRASOUND   TECHNIQUE: Ultrasound examination of the thyroid  gland and adjacent soft tissues was performed.   COMPARISON:  08/12/2022; 10/15/2021 ultrasound-guided right-sided thyroid  nodule fine-needle aspiration-11/13/2021   FINDINGS: Parenchymal Echotexture: Moderately heterogenous   Isthmus: Normal in size measuring 0.3 cm in diameter    Right lobe: Enlarged measuring 6.2 x 3.4 x 2.4 cm, previously, 5.4 x 2.4 x 2.3 cm   Left lobe: Atrophic measuring 3.8 x 1.2 x 1.1 cm, previously, 2.4 x 2.3 x 1.7 cm   _________________________________________________________   Estimated total number of nodules >/= 1 cm: 2   Number of spongiform nodules >/=  2 cm not described below (TR1): 0   Number of mixed cystic and solid nodules >/= 1.5 cm not described below (TR2): 0   _________________________________________________________   The previously biopsied partially cystic though predominantly solid isoechoic nodule/mass within mid aspect of the right lobe of the thyroid  measures 3.9 x 2.9 x 2.2 cm, similar to the 09/2021 examination, previously, 3.3 x 2.7 x 2.0 cm, with slight size differences attributable to interval partial cystic degeneration, a typically benign finding. Correlation with previous biopsy results is advised.   The 2.1 x 1.9 x 1.8 cm partially solid though predominantly cystic isoechoic nodule within the inferior pole of the right lobe of the thyroid  (labeled 2), has decreased in size compared to  the 09/2021 examination, previously, 2.4 x 2.3 x 1.7 cm.   IMPRESSION: 1. Similar findings of multinodular goiter. No worrisome new or enlarging thyroid  nodules. 2. Previously biopsied 3.9 cm nodule/mass within the mid aspect of the right lobe of the thyroid  is grossly unchanged compared to the 09/2021 examination. Correlation with previous biopsy results is advised. Assuming a benign pathologic diagnosis, repeat sampling and/or continued dedicated follow-up is not recommended. 3. Solitary remaining left-sided thyroid  nodule is unchanged to decreased in size compared to the 09/2021 examination and again does not meet imaging criteria to recommend percutaneous sampling or continued dedicated follow-up.   The above is in keeping with the ACR TI-RADS recommendations - J Am Coll Radiol 2017;14:587-595.      Electronically Signed   By: Robbi Childs M.D.   On: 07/28/2023 13:57   Latest Reference Range & Units 03/12/22 06:23 03/12/22 18:22 03/13/22 06:36 07/07/22 15:14 08/10/22 10:38 12/24/23 13:25  TSH 0.450 - 4.500 uIU/mL 0.129 (L)   0.827 1.550 1.560  Triiodothyronine,Free,Serum 2.0 - 4.4 pg/mL   3.2 2.9 3.7 3.1  T4,Free(Direct) 0.82 - 1.77 ng/dL   1.61 0.96 0.45 4.09  Thyroperoxidase Ab SerPl-aCnc 0 - 34 IU/mL  30      Thyroglobulin Antibody 0.0 - 0.9 IU/mL  <1.0      THYROID  STIMULATING IMMUNOGLOBULIN   Rpt      (L): Data is abnormally low Rpt: View report in Results Review for more information   Assessment & Plan:   1. Thyroid  nodule Her FNA biopsy of the right superior thyroid  nodule was benign- see pathology report above.  She did have additional nodules, none of which warrant dedicated follow up at this time.  Her repeat thyroid  ultrasound shows stable nodules, no significant changes, thus we do not need additional imaging studies at this time, unless a new symptom pops up in the future.  2. Abnormal TSH-resolved  she is being seen at a kind request of Joanne Muckle, FNP.  Her repeat thyroid  function tests show euthyroid presentation.  I do recommend annual surveillance which can be done at her PCP office.     -Patient is advised to maintain close follow up with Hazeline Lister Ahmed Hough, FNP for primary care needs.    I spent  17  minutes in the care of the patient today including review of labs from Thyroid  Function, CMP, and other relevant labs ; imaging/biopsy records (current and previous including abstractions from other facilities); face-to-face time discussing  her lab results and symptoms, medications doses, her options of short and long term treatment based on the latest standards of care / guidelines;   and documenting the encounter.  Briana Wyatt  participated in the discussions, expressed understanding, and voiced agreement with the above plans.  All questions were  answered to her satisfaction. she is encouraged to contact clinic should she have any questions or concerns prior to her return visit.  Follow up plan: Return if symptoms worsen or fail to improve.   Thank you for involving me in the care of this pleasant patient, and I will continue to update you with her progress.   Hulon Magic, [facility] [address] [place], [place] [address] Phone: [phone] Fax: [phone]  12/30/2023, 1:18 PM

## 2024-01-03 NOTE — Progress Notes (Deleted)
 [facility] [place]  ASSESSMENT / PLAN: 53 y.o. female with *** - ***  CHIEF COMPLAINT: ***  HISTORY OF PRESENT ILLNESS: Briana Wyatt is a 53 y.o. female ***  VASCULAR SURGICAL HISTORY: ***  VASCULAR RISK FACTORS: {FINDINGS; POSITIVE NEGATIVE:[id number]} history of stroke / transient ischemic attack. {FINDINGS; POSITIVE NEGATIVE:[id number]} history of coronary artery disease. *** history of PCI. *** history of CABG.  {FINDINGS; POSITIVE NEGATIVE:[id number]} history of diabetes mellitus. Last A1c ***. {FINDINGS; POSITIVE NEGATIVE:[id number]} history of smoking. *** actively smoking. {FINDINGS; POSITIVE NEGATIVE:[id number]} history of hypertension. *** drug regimen with *** control. {FINDINGS; POSITIVE NEGATIVE:[id number]} history of chronic kidney disease.  Last GFR ***. CKD {stage:30421363}. {FINDINGS; POSITIVE NEGATIVE:[id number]} history of chronic obstructive pulmonary disease, treated with ***.  FUNCTIONAL STATUS: ECOG performance status: {findings; ecog performance status:31780} Ambulatory status: {TNHAmbulation:25868}  CAREY 1 AND 3 YEAR INDEX Female (2pts) 75-79 or 80-84 (2pts) >84 (3pts) Dependence in toileting (1pt) Partial or full dependence in dressing (1pt) History of malignant neoplasm (2pts) CHF (3pts) COPD (1pts) CKD (3pts)  0-3 pts 6% 1 year mortality ; 21% 3 year mortality 4-5 pts 12% 1 year mortality ; 36% 3 year mortality >5 pts 21% 1 year mortality; 54% 3 year mortality   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   Depression    major   Dyspnea    Fatty liver    Fibromyalgia    Headache    Hypertension    Hypothyroidism    Kidney disease, chronic, stage III (moderate, EGFR 30-59 ml/min) (HCC)    Movement disorder     Pre-diabetes    PTSD (post-traumatic stress disorder)    Restless leg syndrome    Sciatica of right side    Sleep apnea    mild, pt states she is awaiting insurance approval for CPAP   Spondylosis    Cervical and lumbar   Substance abuse (HCC)    Hx Crack/cocaine (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)   Tortuous colon     Past Surgical History:  Procedure Laterality Date   ANTERIOR CERVICAL DECOMP/DISCECTOMY FUSION N/A 02/23/2023   Procedure: Anterior Cervical Decompression/Discectomy Fusion, Cervical Four-Cervical Five, Cervical Five-Cervical Six, Cervical Six-Cervical Cervical Cervical Seven;  Surgeon: Augusto Blonder, MD;  Location: [facility] OR;  Service: Neurosurgery;  Laterality: N/A;  3C   BIOPSY  04/29/2022   Procedure: BIOPSY;  Surgeon: Urban Garden, MD;  Location: [facility];  Service: Gastroenterology;;   CARPAL TUNNEL RELEASE Bilateral    COLONOSCOPY WITH PROPOFOL  N/A 04/29/2022   Procedure: COLONOSCOPY WITH PROPOFOL ;  Surgeon: Urban Garden, MD;  Location: [facility];  Service: Gastroenterology;  Laterality: N/A;  145   ECTOPIC PREGNANCY SURGERY     ESOPHAGOGASTRODUODENOSCOPY (EGD) WITH PROPOFOL  N/A 04/29/2022   Procedure: ESOPHAGOGASTRODUODENOSCOPY (EGD) WITH PROPOFOL ;  Surgeon: Urban Garden, MD;  Location: [facility];  Service: Gastroenterology;  Laterality: N/A;   SHOULDER SURGERY Right    rotator cuff repair- fell 6 months later and had to have 2nd shoulder surgery   SHOULDER SURGERY Right    2nd surgery due to injury 6 months after 1st shoulder surgery   TUBAL LIGATION     ULNAR NERVE TRANSPOSITION Right 12/16/2022   Procedure: ULNAR NERVE RELEASE AT ELBOW;  Surgeon:  Augusto Blonder, MD;  Location: [facility] OR;  Service: Neurosurgery;  Laterality: Right;    Family History  Problem Relation Age of Onset   Hypertension Father    Stroke Father    Hypertension Sister    Dystonia Sister     Social History   Socioeconomic History    Marital status: Single    Spouse name: Not on file   Number of children: 3   Years of education: Not on file   Highest education level: GED or equivalent  Occupational History   Not on file  Tobacco Use   Smoking status: Former    Current packs/day: 0.00    Types: Cigarettes    Quit date: 08/25/1995    Years since quitting: 28.3    Passive exposure: Past   Smokeless tobacco: Never  Vaping Use   Vaping status: Never Used  Substance and Sexual Activity   Alcohol use: No   Drug use: Yes    Types: "Crack" cocaine    Comment: last used 08/2022   Sexual activity: Not Currently    Birth control/protection: Surgical  Other Topics Concern   Not on file  Social History Narrative   Not on file   Social Drivers of Health   Financial Resource Strain: Low Risk  (11/16/2022)   Overall Financial Resource Strain (CARDIA)    Difficulty of Paying Living Expenses: Not very hard  Food Insecurity: No Food Insecurity (12/17/2023)   Hunger Vital Sign    Worried About Running Out of Food in the Last Year: Never true    Ran Out of Food in the Last Year: Never true  Transportation Needs: No Transportation Needs (12/17/2023)   PRAPARE - Administrator, Civil Service (Medical): No    Lack of Transportation (Non-Medical): No  Physical Activity: Insufficiently Active (11/16/2022)   Exercise Vital Sign    Days of Exercise per Week: 3 days    Minutes of Exercise per Session: 20 min  Stress: Stress Concern Present (11/16/2022)   [employer] of Occupational Health - Occupational Stress Questionnaire    Feeling of Stress : Very much  Social Connections: Unknown (11/16/2022)   Social Connection and Isolation Panel [NHANES]    Frequency of Communication with Friends and Family: More than three times a week    Frequency of Social Gatherings with Friends and Family: More than three times a week    Attends Religious Services: Patient declined    Database administrator or Organizations: No     Attends Engineer, structural: Not on file    Marital Status: Divorced  Intimate Partner Violence: Not At Risk (12/17/2023)   Humiliation, Afraid, Rape, and Kick questionnaire    Fear of Current or Ex-Partner: No    Emotionally Abused: No    Physically Abused: No    Sexually Abused: No    Allergies  Allergen Reactions   Codeine Nausea And Vomiting    High doses of codeine pt reports greater than 30mg  she can't tolerate    Nsaids Other (See Comments)    CKD, only one kidney   Diclofenac Sodium Itching    Pennsaid Solution   Hydrocodone -Acetaminophen  Nausea And Vomiting    High doses makes pt vomit Nothing above 30 mg   Methocarbamol Itching and Other (See Comments)    "feels like bugs are crawling under skin"    Current Outpatient Medications  Medication Sig Dispense Refill   acetaminophen  (TYLENOL ) 650 MG CR tablet Take 650 mg  by mouth every 8 (eight) hours as needed for pain or fever.     apixaban  (ELIQUIS ) 5 MG TABS tablet Take 2 tablets (10 mg total) by mouth 2 (two) times daily for 7 days, THEN 1 tablet (5 mg total) 2 (two) times daily for 23 days. 74 tablet 0   APPLE CIDER VINEGAR PO Take 2 capsules by mouth daily.     ARIPiprazole  (ABILIFY ) 5 MG tablet Take 5 mg by mouth daily.     atorvastatin  (LIPITOR) 10 MG tablet Take 10 mg by mouth daily.     cetirizine (ZYRTEC) 10 MG tablet Take 10 mg by mouth daily.     desvenlafaxine (PRISTIQ) 50 MG 24 hr tablet Take 50 mg by mouth daily.     HYDROcodone -acetaminophen  (NORCO/VICODIN) 5-325 MG tablet Take 1 tablet by mouth every 6 (six) hours as needed for severe pain (pain score 7-10). 12 tablet 0   hydrOXYzine  (ATARAX ) 50 MG tablet Take 50 mg by mouth 2 (two) times daily as needed for anxiety.     losartan  (COZAAR ) 50 MG tablet TAKE 1 Tablet BY MOUTH ONCE EVERY DAY (Patient taking differently: Take 50 mg by mouth daily. TAKE 1 Tablet BY MOUTH ONCE EVERY DAY) 90 tablet 1   metFORMIN  (GLUCOPHAGE ) 500 MG tablet Take 500 mg by  mouth daily with breakfast.     omeprazole  (PRILOSEC) 40 MG capsule TAKE 1 Capsule BY MOUTH ONCE EVERY DAY (Patient taking differently: Take 40 mg by mouth daily. TAKE 1 Capsule BY MOUTH ONCE EVERY DAY) 90 capsule 1   pregabalin  (LYRICA ) 150 MG capsule Take 150 mg by mouth 2 (two) times daily.     tiZANidine  (ZANAFLEX ) 2 MG tablet Take 2 mg by mouth at bedtime.     traZODone  (DESYREL ) 50 MG tablet Take 50 mg by mouth at bedtime.     No current facility-administered medications for this visit.    PHYSICAL EXAM There were no vitals filed for this visit.  Constitutional: *** appearing. *** distress. Appears *** nourished.  Neurologic: CN ***. *** focal findings. *** sensory loss. Psychiatric: *** Mood and affect symmetric and appropriate. Eyes: *** No icterus. No conjunctival pallor. Ears, nose, throat: *** mucous membranes moist. Midline trachea.  Cardiac: *** rate and rhythm.  Respiratory: *** unlabored. Abdominal: *** soft, non-tender, non-distended.  Peripheral vascular: *** Extremity: *** edema. *** cyanosis. *** pallor.  Skin: *** gangrene. *** ulceration.  Lymphatic: *** Stemmer's sign. *** palpable lymphadenopathy.    PERTINENT LABORATORY AND RADIOLOGIC DATA  Most recent CBC    Latest Ref Rng & Units 12/18/2023    5:33 AM 12/17/2023    3:47 PM 02/12/2023    1:50 PM  CBC  WBC 4.0 - 10.5 K/uL 7.3  7.9  6.0   Hemoglobin 12.0 - 15.0 g/dL 60.4  54.0  98.1   Hematocrit 36.0 - 46.0 % 35.1  35.9  38.3   Platelets 150 - 400 K/uL 198  191  220      Most recent CMP    Latest Ref Rng & Units 12/18/2023    5:33 AM 12/17/2023    3:47 PM 02/12/2023    1:50 PM  CMP  Glucose 70 - 99 mg/dL 191  92  478   BUN 6 - 20 mg/dL 18  19  23    Creatinine 0.44 - 1.00 mg/dL 2.95  6.21  3.08   Sodium 135 - 145 mmol/L 139  137  139   Potassium 3.5 - 5.1 mmol/L 3.8  4.5  4.1   Chloride 98 - 111 mmol/L 106  104  105   CO2 22 - 32 mmol/L 24  24  25    Calcium  8.9 - 10.3 mg/dL 8.8  9.6  9.3    Total Protein 6.5 - 8.1 g/dL  7.2  6.9   Total Bilirubin 0.0 - 1.2 mg/dL  0.8  0.6   Alkaline Phos 38 - 126 U/L  110  111   AST 15 - 41 U/L  19  26   ALT 0 - 44 U/L  19  29     Renal function Estimated Creatinine Clearance: 74 mL/min (by C-G formula based on SCr of 0.98 mg/dL).  Hgb A1c MFr Bld (%)  Date Value  12/18/2023 5.4    LDL Chol Calc (NIH)  Date Value Ref Range Status  08/10/2022 92 0 - 99 mg/dL Final     Vascular Imaging: ***  Faraz Ponciano N. Edgardo Goodwill, MD FACS [facility] Phone Number: [phone] 01/03/2024 8:37 PM   Total time spent on preparing this encounter including chart review, data review, collecting history, examining the patient, and coordinating care: {tnhtimebilling:26202} {billinglist:27273}  Portions of this report may have been transcribed using voice recognition software.  Every effort has been made to ensure accuracy; however, inadvertent computerized transcription errors may still be present.

## 2024-01-04 ENCOUNTER — Ambulatory Visit: Payer: MEDICAID | Admitting: Vascular Surgery

## 2024-01-06 ENCOUNTER — Encounter: Payer: Self-pay | Admitting: Obstetrics & Gynecology

## 2024-01-25 ENCOUNTER — Ambulatory Visit: Payer: MEDICAID | Admitting: Nutrition

## 2024-02-10 ENCOUNTER — Ambulatory Visit: Payer: MEDICAID | Admitting: Vascular Surgery

## 2024-02-16 ENCOUNTER — Encounter: Payer: MEDICAID | Attending: Family Medicine | Admitting: Nutrition

## 2024-02-16 ENCOUNTER — Encounter: Payer: Self-pay | Admitting: Nutrition

## 2024-02-16 VITALS — Ht 65.0 in | Wt 200.0 lb

## 2024-02-16 DIAGNOSIS — E119 Type 2 diabetes mellitus without complications: Secondary | ICD-10-CM | POA: Insufficient documentation

## 2024-02-16 NOTE — Patient Instructions (Signed)
 Goals Drink 5 bottles of water per day Cut out sweet tea and cut down on  Cut out pork and processed foods and junk food Eat three meals per day Go to bed by 10 and get up at 8 am. Ask MD about exercise

## 2024-02-16 NOTE — Progress Notes (Signed)
 Medical Nutrition Therapy  Appointment Start time:  1515  Appointment End time:   [phone] Primary concerns today: Dm Type 2   Referral diagnosis: E11.9 Preferred learning style: Visual  Learning readiness: Ready   NUTRITION ASSESSMENT  53 yr old wfemale referred for Type 2 DM. She wants to lose weight and get healthier. PCP [facility] Ms. White. Referred by Benton Rio, FNP REA.  A1C 5.4%. Is on Metformin  500 mg BID. Notes she got diagnosed in the hospital a few months ago. Lost 9 lbs recently. She notes she has been larger in the past. Has family members who are morbid obese as well.  She is willing and motivated to work on eating healthier, exercising to help improve her health and reverse some medical conditions. She wants to get off of some medications.  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   Depression    major   Dyspnea    Fatty liver    Fibromyalgia    Headache    Hypertension    Hypothyroidism    Kidney disease, chronic, stage III (moderate, EGFR 30-59 ml/min) (HCC)    Movement disorder    Pre-diabetes    PTSD (post-traumatic stress disorder)    Restless leg syndrome    Sciatica of right side    Sleep apnea    mild, pt states she is awaiting insurance approval for CPAP   Spondylosis    Cervical and lumbar   Substance abuse (HCC)    Hx Crack/cocaine (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)   Tortuous colon     Medications:  Current Outpatient Medications on File Prior to Visit  Medication Sig Dispense Refill   apixaban  (ELIQUIS ) 5 MG TABS tablet Take 2 tablets (10 mg total) by mouth 2 (two) times daily for 7 days, THEN 1 tablet (5 mg total) 2 (two) times daily for 23 days. 74 tablet 0   APPLE CIDER VINEGAR PO Take 2 capsules by  mouth daily.     ARIPiprazole  (ABILIFY ) 5 MG tablet Take 5 mg by mouth daily.     atorvastatin  (LIPITOR) 10 MG tablet Take 10 mg by mouth daily.     cetirizine (ZYRTEC) 10 MG tablet Take 10 mg by mouth daily.     desvenlafaxine (PRISTIQ) 50 MG 24 hr tablet Take 50 mg by mouth daily.     Fezolinetant  (VEOZAH ) 45 MG TABS Take 1 each by mouth daily at 10 pm.     hydrOXYzine  (ATARAX ) 50 MG tablet Take 50 mg by mouth 2 (two) times daily as needed for anxiety.     losartan  (COZAAR ) 50 MG tablet TAKE 1 Tablet BY MOUTH ONCE EVERY DAY 90 tablet 1   metFORMIN  (GLUCOPHAGE ) 500 MG tablet Take 500 mg by mouth daily with breakfast.     omeprazole  (PRILOSEC) 40 MG capsule TAKE 1 Capsule BY MOUTH ONCE EVERY DAY 90 capsule 1   pregabalin  (LYRICA ) 150 MG capsule Take 150 mg by mouth 2 (two) times daily.     tiZANidine  (ZANAFLEX ) 2 MG tablet Take 2 mg by mouth at bedtime.     traZODone  (DESYREL ) 50 MG tablet Take 50 mg by mouth at bedtime.     acetaminophen  (TYLENOL ) 650 MG CR  tablet Take 650 mg by mouth every 8 (eight) hours as needed for pain or fever.     HYDROcodone -acetaminophen  (NORCO/VICODIN) 5-325 MG tablet Take 1 tablet by mouth every 6 (six) hours as needed for severe pain (pain score 7-10). 12 tablet 0   No current facility-administered medications on file prior to visit.    Labs:  Lab Results  Component Value Date   HGBA1C 5.4 12/18/2023    Notable Signs/Symptoms: Sleepy takes trazodone  at night to help go to sleep. Wants to get off of it since it makes her tired during the day and thirsty.   Lifestyle & Dietary Hx Lives with family.  Estimated daily fluid intake: 80 oz Supplements: MVI Sleep: poor Stress / self-care: some Current average weekly physical activity: ADL-- had some blood clots and on medications and unsure what she can do for exercise.  24-Hr Dietary Recall Only ate 1 time yesterday; usually eats 1-2 times per day. Usually eats late a night. Typically sleeps in til noon  or after. Third Meal: 8 pm pete's burgers and french fries, fresca soda  Beverages: water, sf liquids.  Estimated Energy Needs Calories: 1500 Carbohydrate: 170g Protein: 112g Fat: 42g   NUTRITION DIAGNOSIS  NI-1.7 Predicted excessive energy intake As related to diet itypically high n processed foods and fast foods.  As evidenced by 33.  SABRA   NUTRITION INTERVENTION  Nutrition education (E-1) on the following topics:  Nutrition and Diabetes education provided on My Plate, CHO counting, meal planning, portion sizes, timing of meals, avoiding snacks between meals unless having a low blood sugar, target ranges for A1C and blood sugars, signs/symptoms and treatment of hyper/hypoglycemia, monitoring blood sugars, taking medications as prescribed, benefits of exercising 30 minutes per day and prevention of complications of DM.  Lifestyle Medicine  - Whole Food, Plant Predominant Nutrition is highly recommended: Eat Plenty of vegetables, Mushrooms, fruits, Legumes, Whole Grains, Nuts, seeds in lieu of processed meats, processed snacks/pastries red meat, poultry, eggs.    -It is better to avoid simple carbohydrates including: Cakes, Sweet Desserts, Ice Cream, Soda (diet and regular), Sweet Tea, Candies, Chips, Cookies, Store Bought Juices, Alcohol in Excess of  1-2 drinks a day, Lemonade,  Artificial Sweeteners, Doughnuts, Coffee Creamers, Sugar-free Products, etc, etc.  This is not a complete list.....  Exercise: If you are able: 30 -60 minutes a day ,4 days a week, or 150 minutes a week.  The longer the better.  Combine stretch, strength, and aerobic activities.  If you were told in the past that you have high risk for cardiovascular diseases, you may seek evaluation by your heart doctor prior to initiating moderate to intense exercise programs.   Handouts Provided Include  Lifestyle Medicine Know your numbers  Learning Style & Readiness for Change Teaching method utilized: Visual &  Auditory  Demonstrated degree of understanding via: Teach Back  Barriers to learning/adherence to lifestyle change: None  Goals Established by Pt Goals Drink 5 bottles of water per day Cut out sweet tea and cut down on  Cut out pork and processed foods and junk food Eat three meals per day Go to bed by 10 and get up at 8 am. Ask MD about exercise   MONITORING & EVALUATION Dietary intake, weekly physical activity, and weight in 2 months.  Next Steps  Patient is to work on eating better balanced meals and getting proper sleep.SABRA

## 2024-03-14 ENCOUNTER — Ambulatory Visit: Payer: MEDICAID | Admitting: Vascular Surgery

## 2024-04-04 ENCOUNTER — Encounter: Payer: MEDICAID | Attending: Nurse Practitioner | Admitting: Nutrition

## 2024-04-17 ENCOUNTER — Encounter: Payer: MEDICAID | Admitting: Nutrition

## 2024-04-24 NOTE — Progress Notes (Deleted)
 [facility] [place]  ASSESSMENT / PLAN: 53 y.o. female with ***  CHIEF COMPLAINT: ***  HISTORY OF PRESENT ILLNESS: Briana Wyatt is a 53 y.o. female ***  VASCULAR SURGICAL HISTORY: ***  VASCULAR RISK FACTORS: {FINDINGS; POSITIVE NEGATIVE:724-496-1932} history of stroke / transient ischemic attack. {FINDINGS; POSITIVE NEGATIVE:724-496-1932} history of coronary artery disease. *** history of PCI. *** history of CABG.  {FINDINGS; POSITIVE NEGATIVE:724-496-1932} history of diabetes mellitus. Last A1c ***. {FINDINGS; POSITIVE NEGATIVE:724-496-1932} history of smoking. *** actively smoking. {FINDINGS; POSITIVE NEGATIVE:724-496-1932} history of hypertension. *** drug regimen with *** control. {FINDINGS; POSITIVE NEGATIVE:724-496-1932} history of chronic kidney disease.  Last GFR ***. CKD {stage:30421363}. {FINDINGS; POSITIVE NEGATIVE:724-496-1932} history of chronic obstructive pulmonary disease, treated with ***.  FUNCTIONAL STATUS: ECOG performance status: {findings; ecog performance status:31780} Ambulatory status: {TNHAmbulation:25868}  CAREY 1 AND 3 YEAR INDEX Female (2pts) 75-79 or 80-84 (2pts) >84 (3pts) Dependence in toileting (1pt) Partial or full dependence in dressing (1pt) History of malignant neoplasm (2pts) CHF (3pts) COPD (1pts) CKD (3pts)  0-3 pts 6% 1 year mortality ; 21% 3 year mortality 4-5 pts 12% 1 year mortality ; 36% 3 year mortality >5 pts 21% 1 year mortality; 54% 3 year mortality  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   Depression    major   Dyspnea    Fatty liver    Fibromyalgia    Headache    Hypertension    Hypothyroidism    Kidney disease, chronic, stage III (moderate, EGFR 30-59 ml/min) (HCC)    Movement disorder     Pre-diabetes    PTSD (post-traumatic stress disorder)    Restless leg syndrome    Sciatica of right side    Sleep apnea    mild, pt states she is awaiting insurance approval for CPAP   Spondylosis    Cervical and lumbar   Substance abuse (HCC)    Hx Crack/cocaine (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)   Tortuous colon     Past Surgical History:  Procedure Laterality Date   ANTERIOR CERVICAL DECOMP/DISCECTOMY FUSION N/A 02/23/2023   Procedure: Anterior Cervical Decompression/Discectomy Fusion, Cervical Four-Cervical Five, Cervical Five-Cervical Six, Cervical Six-Cervical Cervical Cervical Seven;  Surgeon: Lanis Pupa, MD;  Location: [facility];  Service: Neurosurgery;  Laterality: N/A;  3C   BIOPSY  04/29/2022   Procedure: BIOPSY;  Surgeon: Eartha Angelia Sieving, MD;  [facility];  Service: Gastroenterology;;   CARPAL TUNNEL RELEASE Bilateral    COLONOSCOPY WITH PROPOFOL  N/A 04/29/2022   Procedure: COLONOSCOPY WITH PROPOFOL ;  Surgeon: Eartha Angelia Sieving, MD;  [facility]: [facility];  Service: Gastroenterology;  Laterality: N/A;  145   ECTOPIC PREGNANCY SURGERY     ESOPHAGOGASTRODUODENOSCOPY (EGD) WITH PROPOFOL  N/A 04/29/2022   Procedure: ESOPHAGOGASTRODUODENOSCOPY (EGD) WITH PROPOFOL ;  Surgeon: Eartha Angelia Sieving, MD;  [facility]: [facility];  Service: Gastroenterology;  Laterality: N/A;   SHOULDER SURGERY Right    rotator cuff repair- fell 6 months later and had to have 2nd shoulder surgery   SHOULDER SURGERY Right    2nd surgery due to injury 6 months after 1st shoulder surgery   TUBAL LIGATION     ULNAR NERVE TRANSPOSITION Right 12/16/2022   Procedure: ULNAR NERVE RELEASE AT ELBOW;  Surgeon: Lanis Pupa, MD;  Location: [facility];  Service: Neurosurgery;  Laterality: Right;    Family History  Problem Relation Age of Onset   Hypertension Father    Stroke Father    Hypertension Sister    Dystonia Sister     Social History   Socioeconomic History    Marital status: Single    Spouse name: Not on file   Number of children: 3   Years of education: Not on file   Highest education level: GED or equivalent  Occupational History   Not on file  Tobacco Use   Smoking status: Former    Current packs/day: 0.00    Types: Cigarettes    Quit date: 08/25/1995    Years since quitting: 28.6    Passive exposure: Past   Smokeless tobacco: Never  Vaping Use   Vaping status: Never Used  Substance and Sexual Activity   Alcohol use: No   Drug use: Yes    Types: Crack cocaine    Comment: last used 08/2022   Sexual activity: Not Currently    Birth control/protection: Surgical  Other Topics Concern   Not on file  Social History Narrative   Not on file   Social Drivers of Health   Financial Resource Strain: Low Risk  (11/16/2022)   Overall Financial Resource Strain (CARDIA)    Difficulty of Paying Living Expenses: Not very hard  Food Insecurity: No Food Insecurity (12/17/2023)   Hunger Vital Sign    Worried About Running Out of Food in the Last Year: Never true    Ran Out of Food in the Last Year: Never true  Transportation Needs: No Transportation Needs (12/17/2023)   PRAPARE - Administrator, Civil Service (Medical): No    Lack of Transportation (Non-Medical): No  Physical Activity: Insufficiently Active (11/16/2022)   Exercise Vital Sign    Days of Exercise per Week: 3 days    Minutes of Exercise per Session: 20 min  Stress: Stress Concern Present (11/16/2022)   [employer] of Occupational Health - Occupational Stress Questionnaire    Feeling of Stress : Very much  Social Connections: Unknown (11/16/2022)   Social Connection and Isolation Panel    Frequency of Communication with Friends and Family: More than three times a week    Frequency of Social Gatherings with Friends and Family: More than three times a week    Attends Religious Services: Patient declined    Database administrator or Organizations: No    Attends  Engineer, structural: Not on file    Marital Status: Divorced  Intimate Partner Violence: Not At Risk (12/17/2023)   Humiliation, Afraid, Rape, and Kick questionnaire    Fear of Current or Ex-Partner: No    Emotionally Abused: No    Physically Abused: No    Sexually Abused: No    Allergies  Allergen Reactions   Codeine Nausea And Vomiting    High doses of codeine pt reports greater than 30mg  she can't tolerate    Nsaids Other (See Comments)    CKD, only one kidney   Diclofenac Sodium Itching    Pennsaid Solution   Hydrocodone -Acetaminophen  Nausea And Vomiting    High doses makes pt vomit Nothing above 30 mg   Methocarbamol Itching and Other (See Comments)    feels like bugs are crawling under skin    Current Outpatient Medications  Medication Sig Dispense Refill   acetaminophen  (TYLENOL ) 650 MG CR tablet Take 650 mg by mouth every 8 (eight)  hours as needed for pain or fever.     apixaban  (ELIQUIS ) 5 MG TABS tablet Take 2 tablets (10 mg total) by mouth 2 (two) times daily for 7 days, THEN 1 tablet (5 mg total) 2 (two) times daily for 23 days. 74 tablet 0   APPLE CIDER VINEGAR PO Take 2 capsules by mouth daily.     ARIPiprazole  (ABILIFY ) 5 MG tablet Take 5 mg by mouth daily.     atorvastatin  (LIPITOR) 10 MG tablet Take 10 mg by mouth daily.     cetirizine (ZYRTEC) 10 MG tablet Take 10 mg by mouth daily.     desvenlafaxine (PRISTIQ) 50 MG 24 hr tablet Take 50 mg by mouth daily.     Fezolinetant  (VEOZAH ) 45 MG TABS Take 1 each by mouth daily at 10 pm.     HYDROcodone -acetaminophen  (NORCO/VICODIN) 5-325 MG tablet Take 1 tablet by mouth every 6 (six) hours as needed for severe pain (pain score 7-10). 12 tablet 0   hydrOXYzine  (ATARAX ) 50 MG tablet Take 50 mg by mouth 2 (two) times daily as needed for anxiety.     losartan  (COZAAR ) 50 MG tablet TAKE 1 Tablet BY MOUTH ONCE EVERY DAY 90 tablet 1   metFORMIN  (GLUCOPHAGE ) 500 MG tablet Take 500 mg by mouth daily with breakfast.      omeprazole  (PRILOSEC) 40 MG capsule TAKE 1 Capsule BY MOUTH ONCE EVERY DAY 90 capsule 1   pregabalin  (LYRICA ) 150 MG capsule Take 150 mg by mouth 2 (two) times daily.     tiZANidine  (ZANAFLEX ) 2 MG tablet Take 2 mg by mouth at bedtime.     traZODone  (DESYREL ) 50 MG tablet Take 50 mg by mouth at bedtime.     No current facility-administered medications for this visit.    PHYSICAL EXAM There were no vitals filed for this visit. ***  PERTINENT LABORATORY AND RADIOLOGIC DATA  Most recent CBC    Latest Ref Rng & Units 12/18/2023    5:33 AM 12/17/2023    3:47 PM 02/12/2023    1:50 PM  CBC  WBC 4.0 - 10.5 K/uL 7.3  7.9  6.0   Hemoglobin 12.0 - 15.0 g/dL 88.7  88.3  87.3   Hematocrit 36.0 - 46.0 % 35.1  35.9  38.3   Platelets 150 - 400 K/uL 198  191  220      Most recent CMP    Latest Ref Rng & Units 12/18/2023    5:33 AM 12/17/2023    3:47 PM 02/12/2023    1:50 PM  CMP  Glucose 70 - 99 mg/dL 859  92  888   BUN 6 - 20 mg/dL 18  19  23    Creatinine 0.44 - 1.00 mg/dL 9.01  8.99  8.80   Sodium 135 - 145 mmol/L 139  137  139   Potassium 3.5 - 5.1 mmol/L 3.8  4.5  4.1   Chloride 98 - 111 mmol/L 106  104  105   CO2 22 - 32 mmol/L 24  24  25    Calcium  8.9 - 10.3 mg/dL 8.8  9.6  9.3   Total Protein 6.5 - 8.1 g/dL  7.2  6.9   Total Bilirubin 0.0 - 1.2 mg/dL  0.8  0.6   Alkaline Phos 38 - 126 U/L  110  111   AST 15 - 41 U/L  19  26   ALT 0 - 44 U/L  19  29     Renal function CrCl cannot  be calculated (Patient's most recent lab result is older than the maximum 21 days allowed.).  Hgb A1c MFr Bld (%)  Date Value  12/18/2023 5.4    LDL Chol Calc (NIH)  Date Value Ref Range Status  08/10/2022 92 0 - 99 mg/dL Final     Vascular Imaging: ***  Ronnisha Felber N. Magda, MD [facility] Phone Number: [phone] 04/24/2024 5:36 PM   Total time spent on preparing this encounter including chart review, data review, collecting history,  examining the patient, and coordinating care: {tnhtimebilling:26202} {billinglist:27273}  Portions of this report may have been transcribed using voice recognition software.  Every effort has been made to ensure accuracy; however, inadvertent computerized transcription errors may still be present.

## 2024-04-25 ENCOUNTER — Ambulatory Visit: Payer: MEDICAID | Admitting: Vascular Surgery

## 2024-04-26 ENCOUNTER — Encounter: Payer: MEDICAID | Attending: Nurse Practitioner | Admitting: Nutrition

## 2024-05-22 NOTE — Progress Notes (Deleted)
 Office Note     CC:  *** Requesting Provider:  Bryn Bernardino NOVAK, MD  HPI: Briana Wyatt is a 53 y.o. ([birth year]) female who presents at the request of Gerome Tillman CROME, FNP for evaluation of ***.   Venous symptoms include: positive if (X) [  ] aching [  ] heavy [  ] tired  [  ] throbbing [  ] burning  [  ] itching [  ]swelling [  ] bleeding [  ] ulcer  Onset/duration:  ***  Occupation:  *** Aggravating factors: (sitting, standing) Alleviating factors: (elevation) Compression:  *** Helps:  *** Pain medications:  *** Previous vein procedures:  *** History of DVT:  ***   The pt *** on a statin for cholesterol management.  The pt *** on a daily aspirin.   Other AC:  *** The pt *** on *** for hypertension.   The pt *** diabetic.  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   Depression    major   Dyspnea    Fatty liver    Fibromyalgia    Headache    Hypertension    Hypothyroidism    Kidney disease, chronic, stage III (moderate, EGFR 30-59 ml/min) (HCC)    Movement disorder    Pre-diabetes    PTSD (post-traumatic stress disorder)    Restless leg syndrome    Sciatica of right side    Sleep apnea    mild, pt states she is awaiting insurance approval for CPAP   Spondylosis    Cervical and lumbar   Substance abuse (HCC)    Hx Crack/cocaine (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)   Tortuous colon     Past Surgical History:  Procedure Laterality Date   ANTERIOR CERVICAL DECOMP/DISCECTOMY FUSION N/A 02/23/2023   Procedure: Anterior Cervical Decompression/Discectomy Fusion, Cervical Four-Cervical Five, Cervical Five-Cervical Six, Cervical Six-Cervical Cervical Cervical Seven;  Surgeon: Lanis Pupa, MD;  Location: [facility];  Service: Neurosurgery;   Laterality: N/A;  3C   BIOPSY  04/29/2022   Procedure: BIOPSY;  Surgeon: Eartha Angelia Sieving, MD;  [facility];  Service: Gastroenterology;;   CARPAL TUNNEL RELEASE Bilateral    COLONOSCOPY WITH PROPOFOL  N/A 04/29/2022   Procedure: COLONOSCOPY WITH PROPOFOL ;  Surgeon: Eartha Angelia Sieving, MD;  [facility];  Service: Gastroenterology;  Laterality: N/A;  145   ECTOPIC PREGNANCY SURGERY     ESOPHAGOGASTRODUODENOSCOPY (EGD) WITH PROPOFOL  N/A 04/29/2022   Procedure: ESOPHAGOGASTRODUODENOSCOPY (EGD) WITH PROPOFOL ;  Surgeon: Eartha Angelia Sieving, MD;  [facility];  Service: Gastroenterology;  Laterality: N/A;   SHOULDER SURGERY Right    rotator cuff repair- fell 6 months later and had to have 2nd shoulder surgery   SHOULDER SURGERY Right    2nd surgery due to injury 6 months after 1st shoulder surgery   TUBAL LIGATION     ULNAR NERVE TRANSPOSITION Right 12/16/2022   Procedure: ULNAR NERVE RELEASE AT ELBOW;  Surgeon: Lanis Pupa, MD;  Location[facility];  Service: Neurosurgery;  Laterality: Right;    Social History   Socioeconomic History   Marital status: Single    Spouse name: Not on file   Number of children: 3   Years of education: Not  on file   Highest education level: GED or equivalent  Occupational History   Not on file  Tobacco Use   Smoking status: Former    Current packs/day: 0.00    Types: Cigarettes    Quit date: 08/25/1995    Years since quitting: 28.7    Passive exposure: Past   Smokeless tobacco: Never  Vaping Use   Vaping status: Never Used  Substance and Sexual Activity   Alcohol use: No   Drug use: Yes    Types: Crack cocaine    Comment: last used 08/2022   Sexual activity: Not Currently    Birth control/protection: Surgical  Other Topics Concern   Not on file  Social History Narrative   Not on file   Social Drivers of Health   Financial Resource Strain: Low Risk  (11/16/2022)   Overall Financial  Resource Strain (CARDIA)    Difficulty of Paying Living Expenses: Not very hard  Food Insecurity: No Food Insecurity (12/17/2023)   Hunger Vital Sign    Worried About Running Out of Food in the Last Year: Never true    Ran Out of Food in the Last Year: Never true  Transportation Needs: No Transportation Needs (12/17/2023)   PRAPARE - Administrator, Civil Service (Medical): No    Lack of Transportation (Non-Medical): No  Physical Activity: Insufficiently Active (11/16/2022)   Exercise Vital Sign    Days of Exercise per Week: 3 days    Minutes of Exercise per Session: 20 min  Stress: Stress Concern Present (11/16/2022)   [employer] of Occupational Health - Occupational Stress Questionnaire    Feeling of Stress : Very much  Social Connections: Unknown (11/16/2022)   Social Connection and Isolation Panel    Frequency of Communication with Friends and Family: More than three times a week    Frequency of Social Gatherings with Friends and Family: More than three times a week    Attends Religious Services: Patient declined    Database administrator or Organizations: No    Attends Engineer, structural: Not on file    Marital Status: Divorced  Intimate Partner Violence: Not At Risk (12/17/2023)   Humiliation, Afraid, Rape, and Kick questionnaire    Fear of Current or Ex-Partner: No    Emotionally Abused: No    Physically Abused: No    Sexually Abused: No   *** Family History  Problem Relation Age of Onset   Hypertension Father    Stroke Father    Hypertension Sister    Dystonia Sister     Current Outpatient Medications  Medication Sig Dispense Refill   acetaminophen  (TYLENOL ) 650 MG CR tablet Take 650 mg by mouth every 8 (eight) hours as needed for pain or fever.     apixaban  (ELIQUIS ) 5 MG TABS tablet Take 2 tablets (10 mg total) by mouth 2 (two) times daily for 7 days, THEN 1 tablet (5 mg total) 2 (two) times daily for 23 days. 74 tablet 0   APPLE CIDER  VINEGAR PO Take 2 capsules by mouth daily.     ARIPiprazole  (ABILIFY ) 5 MG tablet Take 5 mg by mouth daily.     atorvastatin  (LIPITOR) 10 MG tablet Take 10 mg by mouth daily.     cetirizine (ZYRTEC) 10 MG tablet Take 10 mg by mouth daily.     desvenlafaxine (PRISTIQ) 50 MG 24 hr tablet Take 50 mg by mouth daily.     Fezolinetant  (VEOZAH ) 45 MG TABS  Take 1 each by mouth daily at 10 pm.     HYDROcodone -acetaminophen  (NORCO/VICODIN) 5-325 MG tablet Take 1 tablet by mouth every 6 (six) hours as needed for severe pain (pain score 7-10). 12 tablet 0   hydrOXYzine  (ATARAX ) 50 MG tablet Take 50 mg by mouth 2 (two) times daily as needed for anxiety.     losartan  (COZAAR ) 50 MG tablet TAKE 1 Tablet BY MOUTH ONCE EVERY DAY 90 tablet 1   metFORMIN  (GLUCOPHAGE ) 500 MG tablet Take 500 mg by mouth daily with breakfast.     omeprazole  (PRILOSEC) 40 MG capsule TAKE 1 Capsule BY MOUTH ONCE EVERY DAY 90 capsule 1   pregabalin  (LYRICA ) 150 MG capsule Take 150 mg by mouth 2 (two) times daily.     tiZANidine  (ZANAFLEX ) 2 MG tablet Take 2 mg by mouth at bedtime.     traZODone  (DESYREL ) 50 MG tablet Take 50 mg by mouth at bedtime.     No current facility-administered medications for this visit.    Allergies  Allergen Reactions   Codeine Nausea And Vomiting    High doses of codeine pt reports greater than 30mg  she can't tolerate    Nsaids Other (See Comments)    CKD, only one kidney   Diclofenac Sodium Itching    Pennsaid Solution   Hydrocodone -Acetaminophen  Nausea And Vomiting    High doses makes pt vomit Nothing above 30 mg   Methocarbamol Itching and Other (See Comments)    feels like bugs are crawling under skin     REVIEW OF SYSTEMS:  *** [X]  denotes positive finding, [ ]  denotes negative finding Cardiac  Comments:  Chest pain or chest pressure:    Shortness of breath upon exertion:    Short of breath when lying flat:    Irregular heart rhythm:        Vascular    Pain in calf, thigh, or  hip brought on by ambulation:    Pain in feet at night that wakes you up from your sleep:     Blood clot in your veins:    Leg swelling:         Pulmonary    Oxygen at home:    Productive cough:     Wheezing:         Neurologic    Sudden weakness in arms or legs:     Sudden numbness in arms or legs:     Sudden onset of difficulty speaking or slurred speech:    Temporary loss of vision in one eye:     Problems with dizziness:         Gastrointestinal    Blood in stool:     Vomited blood:         Genitourinary    Burning when urinating:     Blood in urine:        Psychiatric    Major depression:         Hematologic    Bleeding problems:    Problems with blood clotting too easily:        Skin    Rashes or ulcers:        Constitutional    Fever or chills:      PHYSICAL EXAMINATION:  There were no vitals filed for this visit.  General:  WDWN in NAD; vital signs documented above Gait: Not observed HENT: WNL, normocephalic Pulmonary: normal non-labored breathing , without Rales, rhonchi,  wheezing Cardiac: {Desc; regular/irreg:14544} HR, without  Murmurs {With/Without:20273}  carotid bruit*** Abdomen: soft, NT, no masses Skin: {With/Without:20273} rashes Vascular Exam/Pulses:  Right Left  Radial {Exam; arterial pulse strength 0-4:30167} {Exam; arterial pulse strength 0-4:30167}  Ulnar {Exam; arterial pulse strength 0-4:30167} {Exam; arterial pulse strength 0-4:30167}  Femoral {Exam; arterial pulse strength 0-4:30167} {Exam; arterial pulse strength 0-4:30167}  Popliteal {Exam; arterial pulse strength 0-4:30167} {Exam; arterial pulse strength 0-4:30167}  DP {Exam; arterial pulse strength 0-4:30167} {Exam; arterial pulse strength 0-4:30167}  PT {Exam; arterial pulse strength 0-4:30167} {Exam; arterial pulse strength 0-4:30167}   Extremities: {With/Without:20273} ischemic changes, {With/Without:20273} Gangrene , {With/Without:20273} cellulitis; {With/Without:20273} open  wounds;  Musculoskeletal: no muscle wasting or atrophy  Neurologic: A&O X 3;  No focal weakness or paresthesias are detected Psychiatric:  The pt has {Desc; normal/abnormal:11317::Normal} affect.   Non-Invasive Vascular Imaging:   ***    ASSESSMENT/PLAN:: 53 y.o. female presenting with ***   ***   Fonda FORBES Rim, MD [facility] [phone]

## 2024-05-25 ENCOUNTER — Encounter: Payer: MEDICAID | Admitting: Vascular Surgery

## 2024-05-25 ENCOUNTER — Encounter: Payer: MEDICAID | Admitting: Student-PharmD

## 2024-06-08 ENCOUNTER — Ambulatory Visit: Payer: MEDICAID | Attending: Vascular Surgery | Admitting: Pharmacist

## 2024-06-08 VITALS — BP 127/97 | HR 74 | Wt 238.4 lb

## 2024-06-08 DIAGNOSIS — I82411 Acute embolism and thrombosis of right femoral vein: Secondary | ICD-10-CM | POA: Insufficient documentation

## 2024-06-08 NOTE — Patient Instructions (Signed)
-  Continue apixaban  (Eliquis ) 5 mg twice daily. -It is important to take your medication around the same time every day.  -Avoid NSAIDs like ibuprofen  (Advil , Motrin ) and naproxen  (Aleve ) as well as aspirin  doses over 100 mg daily. -Tylenol  (acetaminophen ) is the preferred over the counter pain medication to lower the risk of bleeding. -Be sure to alert all of your health care providers that you are taking an anticoagulant prior to starting a new medication or having a procedure. -Monitor for signs and symptoms of bleeding (abnormal bruising, prolonged bleeding, nose bleeds, bleeding from gums, discolored urine, black tarry stools). If you have fallen and hit your head OR if your bleeding is severe or not stopping, seek emergency care.  -Go to the emergency room if emergent signs and symptoms of new clot occur (new or worse swelling and pain in an arm or leg, shortness of breath, chest pain, fast or irregular heartbeats, lightheadedness, dizziness, fainting, coughing up blood) or if you experience a significant color change (pale or blue) in the extremity that has the DVT.  -We recommend you wear compression stockings (20-30 mmHg) as long as you are having swelling or pain. Be sure to purchase the correct size and take them off at night.   If you have any questions or need to reschedule an appointment, please call [phone]. If you are having an emergency, call 911 or present to the nearest emergency room.   What is a DVT?  -Deep vein thrombosis (DVT) is a condition in which a blood clot forms in a vein of the deep venous system which can occur in the lower leg, thigh, pelvis, arm, or neck. This condition is serious and can be life-threatening if the clot travels to the arteries of the lungs and causing a blockage (pulmonary embolism, PE). A DVT can also damage veins in the leg, which can lead to long-term venous disease, leg pain, swelling, discoloration, and ulcers or sores (post-thrombotic  syndrome).  -Treatment may include taking an anticoagulant medication to prevent more clots from forming and the current clot from growing, wearing compression stockings, and/or surgical procedures to remove or dissolve the clot.

## 2024-06-08 NOTE — Progress Notes (Addendum)
 DVT Clinic Note  Name: Briana Wyatt     MRN: [id number]     DOB: [birth year]     Sex: female  PCP: Teresa Jenkins Jansky, FNP  Today's Visit: Visit Information: Initial Visit  Referred to DVT Clinic by: [facility] - Dr. Bernardino Come Referred to CPP by: Dr. Lanis Reason for referral:  Chief Complaint  Patient presents with   DVT   HISTORY OF PRESENT ILLNESS: Briana Wyatt is a 52 y.o. female with who presents after diagnosis of DVT for medication management. Patient was admitted to [facility] 12/17/23 to 12/18/23 for acute bilateral PE with no evidence of right heart strain and RLE DVT involving the femoral, popliteal, and calf veins. At that time, the patient was discussed with vascular surgeon, Dr. Gretta, who stated she was not a candidate for thrombectomy. She was discharged on Eliquis  and referred to DVT Clinic for follow up. She has cancelled multiple several visits at [facility] since then. Fill history shows her PCP has been refilling Eliquis  for her.  Patient presents to clinic today ambulating independently. Denies abnormal bleeding or bruising. Reports adherence to Eliquis  as prescribed, rarely misses a dose. Denies wearing compression stockings. Endorses some pain and occasional swelling in her right calf. Also reports a new pain in her left leg. Endorses some occasional SOB, but feels at her baseline and says this has been going on for years. She endorses a sedentary lifestyle. Reports spending about 3 hours per day in bed and the other hours she does things around the house. Denies prior history of DVT or family history of DVT.   Positive Thrombotic Risk Factors: Obesity Bleeding Risk Factors: None Present  Negative Thrombotic Risk Factors: Previous VTE, Recent surgery (within 3 months), Recent trauma (within 3 months), Recent admission to hospital with acute illness (within 3 months), Paralysis, paresis, or recent plaster cast immobilization of lower extremity, Central venous  catheterization, Bed rest >72 hours within 3 months, Sedentary journey lasting >8 hours within 4 weeks, Pregnancy, Estrogen therapy, Recent cesarean section (within 3 months), Within 6 weeks postpartum, Testosterone therapy, Erythropoiesis-stimulating agent, Recent COVID diagnosis (within 3 months), Known thrombophilic condition, Smoking, Non-malignant, chronic inflammatory condition, Active cancer, Obesity, Older age  Rx Insurance Coverage: Medicaid Rx Affordability: Patient states Eliquis  is affordable for her. Rx Assistance Provided: None needed Preferred Pharmacy: Patient stated her PCP already provided Eliquis  refills.  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   Depression    major   Dyspnea    Fatty liver    Fibromyalgia    Headache    Hypertension    Hypothyroidism    Kidney disease, chronic, stage III (moderate, EGFR 30-59 ml/min) (HCC)    Movement disorder    Pre-diabetes    PTSD (post-traumatic stress disorder)    Restless leg syndrome    Sciatica of right side    Sleep apnea    mild, pt states she is awaiting insurance approval for CPAP   Spondylosis    Cervical and lumbar   Substance abuse (HCC)    Hx Crack/cocaine (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)   Tortuous colon     Past Surgical History:  Procedure Laterality Date   ANTERIOR CERVICAL DECOMP/DISCECTOMY FUSION N/A 02/23/2023   Procedure: Anterior  Cervical Decompression/Discectomy Fusion, Cervical Four-Cervical Five, Cervical Five-Cervical Six, Cervical Six-Cervical Cervical Cervical Seven;  Surgeon: Lanis Pupa, MD;  Location: [facility] OR;  Service: Neurosurgery;  Laterality: N/A;  3C   BIOPSY  04/29/2022   Procedure: BIOPSY;  Surgeon: Eartha Angelia Sieving, MD;  Location: [facility];  Service: Gastroenterology;;    CARPAL TUNNEL RELEASE Bilateral    COLONOSCOPY WITH PROPOFOL  N/A 04/29/2022   Procedure: COLONOSCOPY WITH PROPOFOL ;  Surgeon: Eartha Angelia Sieving, MD;  Location: [facility];  Service: Gastroenterology;  Laterality: N/A;  145   ECTOPIC PREGNANCY SURGERY     ESOPHAGOGASTRODUODENOSCOPY (EGD) WITH PROPOFOL  N/A 04/29/2022   Procedure: ESOPHAGOGASTRODUODENOSCOPY (EGD) WITH PROPOFOL ;  Surgeon: Eartha Angelia Sieving, MD;  Location: [facility];  Service: Gastroenterology;  Laterality: N/A;   SHOULDER SURGERY Right    rotator cuff repair- fell 6 months later and had to have 2nd shoulder surgery   SHOULDER SURGERY Right    2nd surgery due to injury 6 months after 1st shoulder surgery   TUBAL LIGATION     ULNAR NERVE TRANSPOSITION Right 12/16/2022   Procedure: ULNAR NERVE RELEASE AT ELBOW;  Surgeon: Lanis Pupa, MD;  Location: [facility] OR;  Service: Neurosurgery;  Laterality: Right;    Social History   Socioeconomic History   Marital status: Single    Spouse name: Not on file   Number of children: 3   Years of education: Not on file   Highest education level: GED or equivalent  Occupational History   Not on file  Tobacco Use   Smoking status: Former    Current packs/day: 0.00    Types: Cigarettes    Quit date: 08/25/1995    Years since quitting: 28.8    Passive exposure: Past   Smokeless tobacco: Never  Vaping Use   Vaping status: Never Used  Substance and Sexual Activity   Alcohol use: No   Drug use: Yes    Types: Crack cocaine    Comment: last used 08/2022   Sexual activity: Not Currently    Birth control/protection: Surgical  Other Topics Concern   Not on file  Social History Narrative   Not on file   Social Drivers of Health   Financial Resource Strain: Low Risk  (11/16/2022)   Overall Financial Resource Strain (CARDIA)    Difficulty of Paying Living Expenses: Not very hard  Food Insecurity: No Food Insecurity (12/17/2023)   Hunger Vital Sign    Worried  About Running Out of Food in the Last Year: Never true    Ran Out of Food in the Last Year: Never true  Transportation Needs: No Transportation Needs (12/17/2023)   PRAPARE - Administrator, Civil Service (Medical): No    Lack of Transportation (Non-Medical): No  Physical Activity: Insufficiently Active (11/16/2022)   Exercise Vital Sign    Days of Exercise per Week: 3 days    Minutes of Exercise per Session: 20 min  Stress: Stress Concern Present (11/16/2022)   [employer] of Occupational Health - Occupational Stress Questionnaire    Feeling of Stress : Very much  Social Connections: Unknown (11/16/2022)   Social Connection and Isolation Panel    Frequency of Communication with Friends and Family: More than three times a week    Frequency of Social Gatherings with Friends and Family: More than three times a week    Attends Religious Services: Patient declined    Active Member of Clubs or Organizations: No    Attends Club or  Organization Meetings: Not on file    Marital Status: Divorced  Intimate Partner Violence: Not At Risk (12/17/2023)   Humiliation, Afraid, Rape, and Kick questionnaire    Fear of Current or Ex-Partner: No    Emotionally Abused: No    Physically Abused: No    Sexually Abused: No    Family History  Problem Relation Age of Onset   Hypertension Father    Stroke Father    Hypertension Sister    Dystonia Sister     Allergies as of 06/08/2024 - Review Complete 06/08/2024  Allergen Reaction Noted   Codeine Nausea And Vomiting 05/26/2017   Nsaids Other (See Comments) 04/07/2019   Diclofenac sodium Itching 04/21/2017   Hydrocodone -acetaminophen  Nausea And Vomiting 04/29/2017   Methocarbamol Itching and Other (See Comments) 04/21/2017    Current Outpatient Medications on File Prior to Visit  Medication Sig Dispense Refill   acetaminophen  (TYLENOL ) 650 MG CR tablet Take 650 mg by mouth every 8 (eight) hours as needed for pain or fever.     apixaban   (ELIQUIS ) 5 MG TABS tablet Take 5 mg by mouth 2 (two) times daily.     APPLE CIDER VINEGAR PO Take 2 capsules by mouth daily.     ARIPiprazole  (ABILIFY ) 5 MG tablet Take 5 mg by mouth daily.     atorvastatin  (LIPITOR) 10 MG tablet Take 10 mg by mouth daily.     cetirizine (ZYRTEC) 10 MG tablet Take 10 mg by mouth daily.     desvenlafaxine (PRISTIQ) 50 MG 24 hr tablet Take 50 mg by mouth daily.     Fezolinetant  (VEOZAH ) 45 MG TABS Take 1 each by mouth daily at 10 pm.     hydrOXYzine  (ATARAX ) 50 MG tablet Take 50 mg by mouth 2 (two) times daily as needed for anxiety.     losartan  (COZAAR ) 50 MG tablet TAKE 1 Tablet BY MOUTH ONCE EVERY DAY 90 tablet 1   metFORMIN  (GLUCOPHAGE ) 500 MG tablet Take 500 mg by mouth daily with breakfast.     omeprazole  (PRILOSEC) 40 MG capsule TAKE 1 Capsule BY MOUTH ONCE EVERY DAY 90 capsule 1   pregabalin  (LYRICA ) 150 MG capsule Take 150 mg by mouth 2 (two) times daily.     tiZANidine  (ZANAFLEX ) 2 MG tablet Take 2 mg by mouth at bedtime.     traZODone  (DESYREL ) 50 MG tablet Take 50 mg by mouth at bedtime.     No current facility-administered medications on file prior to visit.   REVIEW OF SYSTEMS:  Review of Systems  Respiratory:  Negative for shortness of breath.   Cardiovascular:  Negative for chest pain and leg swelling.  Musculoskeletal:  Negative for myalgias.   PHYSICAL EXAMINATION:  Vitals:   06/08/24 1353  Pulse: 88  SpO2: 99%  Weight: 238 lb 6.4 oz (108.1 kg)    Body mass index is 39.67 kg/m.  Physical Exam Vitals reviewed.  Pulmonary:     Effort: Pulmonary effort is normal.  Musculoskeletal:        General: Tenderness (right calf) present.     Right lower leg: No edema.  Neurological:     Mental Status: She is alert.    Villalta Score for Post-Thrombotic Syndrome: Pain: Mild Cramps: Absent Heaviness: Absent Paresthesia: Absent Pruritus: Absent Pretibial Edema: Absent Skin Induration: Absent Hyperpigmentation: Absent Redness:  Absent Venous Ectasia: Absent Pain on calf compression: Absent Villalta Preliminary Score: 1 Is venous ulcer present?: No If venous ulcer is present and score is <15, then  15 points total are assigned: Absent Villalta Total Score: 1  LABS:  CBC     Component Value Date/Time   WBC 7.3 12/18/2023 0533   RBC 4.17 12/18/2023 0533   HGB 11.2 (L) 12/18/2023 0533   HGB 11.9 08/10/2022 1039   HCT 35.1 (L) 12/18/2023 0533   HCT 34.5 08/10/2022 1039   PLT 198 12/18/2023 0533   PLT 239 08/10/2022 1039   MCV 84.2 12/18/2023 0533   MCV 81 08/10/2022 1039   MCH 26.9 12/18/2023 0533   MCHC 31.9 12/18/2023 0533   RDW 13.1 12/18/2023 0533   RDW 11.6 (L) 08/10/2022 1039   LYMPHSABS 1.7 12/17/2023 1547   LYMPHSABS 1.5 08/10/2022 1039   MONOABS 0.7 12/17/2023 1547   EOSABS 0.1 12/17/2023 1547   EOSABS 0.2 08/10/2022 1039   BASOSABS 0.1 12/17/2023 1547   BASOSABS 0.1 08/10/2022 1039    Hepatic Function      Component Value Date/Time   PROT 7.2 12/17/2023 1547   PROT 5.9 (L) 08/10/2022 1039   ALBUMIN 3.5 12/17/2023 1547   ALBUMIN 3.7 (L) 08/10/2022 1039   AST 19 12/17/2023 1547   ALT 19 12/17/2023 1547   ALKPHOS 110 12/17/2023 1547   BILITOT 0.8 12/17/2023 1547   BILITOT <0.2 08/10/2022 1039    Renal Function   Lab Results  Component Value Date   CREATININE 0.98 12/18/2023   CREATININE 1.00 12/17/2023   CREATININE 1.19 (H) 02/12/2023    CrCl cannot be calculated (Patient's most recent lab result is older than the maximum 21 days allowed.).   [facility] Vascular Lab Studies:  12/25/23 US  Venous Img Lower Unilateral Right: IMPRESSION: 1. Continued, extensive occlusive DVT within the right femoral vein, popliteal vein and posterior tibial vein.  12/17/23 US  Venous Img Lower Right (DVT Study): IMPRESSION: Extensive occlusive DVT involving the femoral, popliteal and calf veins of the right lower extremity.  ASSESSMENT: Location of DVT: Right femoral vein, Right popliteal vein, Right  distal vein Cause of DVT: unprovoked  Patient without prior history of DVT diagnosed with acute DVT involving the right femoral, popliteal, and posterior tibial veins as well as bilateral PE in April 2025 and was hospitalized at [facility]. She was referred to DVT Clinic on discharge, but cancelled several appointments at [facility] that were scheduled over the past few months. No clear, strong provoking factors for DVT identified today. She does have a somewhat sedentary lifestyle. She is close to completing 6 months of treatment with Eliquis  and has tolerated the medication well. Given no clear provoking factors recommend to continue Eliquis  for now and will refer her to hematology for further work up and anticoagulation management. Extensively counseled on Eliquis  and all patient questions were answered. No medication adherence barriers identified. Discussed the risk of post thrombotic syndrome and recommended to wear compression stockings and elevate her leg for swelling. Patient reported she already had Eliquis  refills available from her PCP. Counseled to follow up with PCP for new pain in left leg.  PLAN: -Continue apixaban  (Eliquis ) 5 mg twice daily. -Expected duration of therapy: per hematology. Therapy started on 12/17/23. -Patient educated on purpose, proper use and potential adverse effects of apixaban  (Eliquis ). -Discussed importance of taking medication around the same time every day. -Advised patient of medications to avoid (NSAIDs, aspirin doses >100 mg daily). -Educated that Tylenol  (acetaminophen ) is the preferred analgesic to lower the risk of bleeding. -Advised patient to alert all providers of anticoagulation therapy prior to starting a new medication or having a  procedure. -Emphasized importance of monitoring for signs and symptoms of bleeding (abnormal bruising, prolonged bleeding, nose bleeds, bleeding from gums, discolored urine, black tarry stools). -Educated patient to present to the  ED if emergent signs and symptoms of new thrombosis occur. -Counseled patient to wear compression stockings daily, removing at night.  Follow up: Referred to hematology  Izetta Henry, PharmD [facility] Clinical Pharmacist [phone]  I have evaluated the patient's chart/imaging and refer this patient to the Clinical Pharmacist Practitioner for medication management. I have reviewed the CPP's documentation and agree with her assessment and plan. I was immediately available during the visit for questions and collaboration.   Fonda FORBES Rim, MD

## 2024-06-27 ENCOUNTER — Other Ambulatory Visit (HOSPITAL_COMMUNITY): Payer: Self-pay | Admitting: Family Medicine

## 2024-06-27 DIAGNOSIS — Z1231 Encounter for screening mammogram for malignant neoplasm of breast: Secondary | ICD-10-CM

## 2024-07-05 ENCOUNTER — Ambulatory Visit (HOSPITAL_COMMUNITY)
Admission: RE | Admit: 2024-07-05 | Discharge: 2024-07-05 | Disposition: A | Discharge status: Home / Self Care | Payer: MEDICAID | Source: Ambulatory Visit | Attending: Family Medicine | Admitting: Family Medicine

## 2024-07-05 ENCOUNTER — Encounter (HOSPITAL_COMMUNITY): Payer: Self-pay

## 2024-07-05 DIAGNOSIS — Z1231 Encounter for screening mammogram for malignant neoplasm of breast: Secondary | ICD-10-CM | POA: Insufficient documentation

## 2024-07-07 ENCOUNTER — Encounter: Payer: Self-pay | Admitting: Hematology

## 2024-07-07 ENCOUNTER — Inpatient Hospital Stay: Payer: MEDICAID | Attending: Hematology | Admitting: Hematology

## 2024-07-07 ENCOUNTER — Ambulatory Visit (HOSPITAL_COMMUNITY)
Admission: RE | Admit: 2024-07-07 | Discharge: 2024-07-07 | Disposition: A | Discharge status: Home / Self Care | Payer: MEDICAID | Source: Ambulatory Visit | Attending: Hematology | Admitting: Hematology

## 2024-07-07 ENCOUNTER — Inpatient Hospital Stay: Payer: MEDICAID

## 2024-07-07 VITALS — BP 115/73 | HR 78 | Temp 97.8°F | Resp 16 | Ht 65.0 in | Wt 189.2 lb

## 2024-07-07 DIAGNOSIS — I2693 Single subsegmental pulmonary embolism without acute cor pulmonale: Secondary | ICD-10-CM | POA: Diagnosis not present

## 2024-07-07 DIAGNOSIS — G4730 Sleep apnea, unspecified: Secondary | ICD-10-CM | POA: Insufficient documentation

## 2024-07-07 DIAGNOSIS — Z8759 Personal history of other complications of pregnancy, childbirth and the puerperium: Secondary | ICD-10-CM | POA: Insufficient documentation

## 2024-07-07 DIAGNOSIS — Z8249 Family history of ischemic heart disease and other diseases of the circulatory system: Secondary | ICD-10-CM | POA: Insufficient documentation

## 2024-07-07 DIAGNOSIS — N1831 Chronic kidney disease, stage 3a: Secondary | ICD-10-CM | POA: Insufficient documentation

## 2024-07-07 DIAGNOSIS — Z79899 Other long term (current) drug therapy: Secondary | ICD-10-CM | POA: Insufficient documentation

## 2024-07-07 DIAGNOSIS — I82411 Acute embolism and thrombosis of right femoral vein: Secondary | ICD-10-CM | POA: Diagnosis not present

## 2024-07-07 DIAGNOSIS — Z7901 Long term (current) use of anticoagulants: Secondary | ICD-10-CM | POA: Insufficient documentation

## 2024-07-07 DIAGNOSIS — M79605 Pain in left leg: Secondary | ICD-10-CM | POA: Insufficient documentation

## 2024-07-07 DIAGNOSIS — Z8719 Personal history of other diseases of the digestive system: Secondary | ICD-10-CM | POA: Insufficient documentation

## 2024-07-07 DIAGNOSIS — G2581 Restless legs syndrome: Secondary | ICD-10-CM | POA: Insufficient documentation

## 2024-07-07 DIAGNOSIS — Z9851 Tubal ligation status: Secondary | ICD-10-CM | POA: Insufficient documentation

## 2024-07-07 DIAGNOSIS — J449 Chronic obstructive pulmonary disease, unspecified: Secondary | ICD-10-CM | POA: Insufficient documentation

## 2024-07-07 DIAGNOSIS — M797 Fibromyalgia: Secondary | ICD-10-CM | POA: Insufficient documentation

## 2024-07-07 DIAGNOSIS — Z86718 Personal history of other venous thrombosis and embolism: Secondary | ICD-10-CM | POA: Insufficient documentation

## 2024-07-07 DIAGNOSIS — Z885 Allergy status to narcotic agent status: Secondary | ICD-10-CM | POA: Insufficient documentation

## 2024-07-07 DIAGNOSIS — I82431 Acute embolism and thrombosis of right popliteal vein: Secondary | ICD-10-CM | POA: Diagnosis not present

## 2024-07-07 DIAGNOSIS — R600 Localized edema: Secondary | ICD-10-CM | POA: Insufficient documentation

## 2024-07-07 DIAGNOSIS — F32A Depression, unspecified: Secondary | ICD-10-CM | POA: Insufficient documentation

## 2024-07-07 DIAGNOSIS — Z823 Family history of stroke: Secondary | ICD-10-CM | POA: Insufficient documentation

## 2024-07-07 DIAGNOSIS — R7303 Prediabetes: Secondary | ICD-10-CM | POA: Insufficient documentation

## 2024-07-07 DIAGNOSIS — Z886 Allergy status to analgesic agent status: Secondary | ICD-10-CM | POA: Insufficient documentation

## 2024-07-07 DIAGNOSIS — I129 Hypertensive chronic kidney disease with stage 1 through stage 4 chronic kidney disease, or unspecified chronic kidney disease: Secondary | ICD-10-CM | POA: Insufficient documentation

## 2024-07-07 DIAGNOSIS — Z86711 Personal history of pulmonary embolism: Secondary | ICD-10-CM | POA: Insufficient documentation

## 2024-07-07 DIAGNOSIS — Z87891 Personal history of nicotine dependence: Secondary | ICD-10-CM | POA: Insufficient documentation

## 2024-07-07 DIAGNOSIS — F4310 Post-traumatic stress disorder, unspecified: Secondary | ICD-10-CM | POA: Insufficient documentation

## 2024-07-07 NOTE — Progress Notes (Signed)
 [facility]   [phone] [phone]   Clinic New Consult Note   Patient Care Team: Teresa Jenkins Jansky, FNP as PCP - General (Family Medicine) Okey Vina GAILS, MD as PCP - Cardiology (Cardiology) 07/07/2024  CHIEF COMPLAINTS/PURPOSE OF CONSULTATION:  PE and DVT  REFERRING PHYSICIAN: DVT clinic   Discussed the use of AI scribe software for clinical note transcription with the patient, who gave verbal consent to proceed.  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 She was referred by the [facility] clinic pharmacist for evaluation of her blood clots.  In April 2025, she was diagnosed with blood clots in her right leg after experiencing swelling and pain.  Her symptoms started 1 to 2 months before, and she had right shoulder surgery in January 2025.  Her DVT was quite extensive, involving the femoral, popliteal and calf vein of the right lower extremity and only was occlusive.  CTA also showed bilateral lower lobe segmental and subsegmental PE.  She was put on Eliquis , and has been tolerating well.  She does miss the dose 1-2 times a week. currently, she has similar symptoms in her left leg, including pain and swelling, raising suspicion of another clot.  She underwent shoulder surgery in January, after which she was mobile but experienced increased sleepiness. She has been on Eliquis  for almost six months but misses doses approximately once a week. She has a history of pulmonary embolism and confirmed clots in her right leg veins.  Her medical history includes fibromyalgia, hypertension, thyroid  disorder, kidney disease, prediabetes, restless leg syndrome, sleep apnea, Barrett's esophagus, lung nodules, and COPD. She is disabled due to PTSD and depression. Her medications include metformin , atorvastatin , Lyrica , losartan , omeprazole , Zanaflex , Abilify , Pristiq, Veozah , trazodone ,  and hydrocortisone .     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   Depression    major   Dyspnea    Fatty liver    Fibromyalgia    Headache    Hypertension    Hypothyroidism    Kidney disease, chronic, stage III (moderate, EGFR 30-59 ml/min) (HCC)    Movement disorder    Pre-diabetes    PTSD (post-traumatic stress disorder)    Restless leg syndrome    Sciatica of right side    Sleep apnea    mild, pt states she is awaiting insurance approval for CPAP   Spondylosis    Cervical and lumbar   Substance abuse (HCC)    Hx Crack/cocaine (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)   Tortuous colon     SURGICAL HISTORY: Past Surgical History:  Procedure Laterality Date   ANTERIOR CERVICAL DECOMP/DISCECTOMY FUSION N/A 02/23/2023   Procedure: Anterior Cervical Decompression/Discectomy Fusion, Cervical Four-Cervical Five, Cervical Five-Cervical Six, Cervical Six-Cervical Cervical Cervical Seven;  Surgeon: Lanis Pupa, MD;  Location: [facility] OR;  Service: Neurosurgery;  Laterality: N/A;  3C   BIOPSY  04/29/2022   Procedure: BIOPSY;  Surgeon: Eartha Angelia Sieving, MD;  Location: [facility] END[facility] SUITE;  Service: Gastroenterology;;   CARPAL TUNNEL RELEASE Bilateral    COLONOSCOPY WITH PROPOFOL  N/A 04/29/2022   Procedure: COLONOSCOPY WITH PROPOFOL ;  Surgeon: Eartha Angelia Sieving, MD;  Location: [facility]  SUITE;  Service: Gastroenterology;  Laterality: N/A;  145   ECTOPIC PREGNANCY SURGERY     ESOPHAGOGASTRODUODENOSCOPY (EGD) WITH PROPOFOL  N/A 04/29/2022   Procedure: ESOPHAGOGASTRODUODENOSCOPY (EGD) WITH PROPOFOL ;  Surgeon: Eartha Angelia Sieving, MD;  Location: [facility];  Service: Gastroenterology;  Laterality: N/A;   SHOULDER SURGERY Right    rotator cuff repair- fell 6  months later and had to have 2nd shoulder surgery   SHOULDER SURGERY Right    2nd surgery due to injury 6 months after 1st shoulder surgery   TUBAL LIGATION     ULNAR NERVE TRANSPOSITION Right 12/16/2022   Procedure: ULNAR NERVE RELEASE AT ELBOW;  Surgeon: Lanis Pupa, MD;  Location: [facility] OR;  Service: Neurosurgery;  Laterality: Right;    SOCIAL HISTORY: Social History   Socioeconomic History   Marital status: Single    Spouse name: Not on file   Number of children: 3   Years of education: Not on file   Highest education level: GED or equivalent  Occupational History   Not on file  Tobacco Use   Smoking status: Former    Current packs/day: 0.00    Types: Cigarettes    Quit date: 08/25/1995    Years since quitting: 28.8    Passive exposure: Past   Smokeless tobacco: Never  Vaping Use   Vaping status: Never Used  Substance and Sexual Activity   Alcohol use: No   Drug use: Yes    Types: Crack cocaine    Comment: last used 08/2022   Sexual activity: Not Currently    Birth control/protection: Surgical  Other Topics Concern   Not on file  Social History Narrative   Not on file   Social Drivers of Health   Financial Resource Strain: Low Risk  (11/16/2022)   Overall Financial Resource Strain (CARDIA)    Difficulty of Paying Living Expenses: Not very hard  Food Insecurity: No Food Insecurity (07/07/2024)   Hunger Vital Sign    Worried About Running Out of Food in the Last Year: Never true    Ran Out of Food in the Last Year: Never true  Transportation Needs: No Transportation Needs (07/07/2024)   PRAPARE - Administrator, Civil Service (Medical): No    Lack of Transportation (Non-Medical): No  Physical Activity: Insufficiently Active (11/16/2022)   Exercise Vital Sign    Days of Exercise per Week: 3 days    Minutes of Exercise per Session: 20 min  Stress: Stress Concern Present (11/16/2022)   [employer] of Occupational Health - Occupational Stress  Questionnaire    Feeling of Stress : Very much  Social Connections: Unknown (11/16/2022)   Social Connection and Isolation Panel    Frequency of Communication with Friends and Family: More than three times a week    Frequency of Social Gatherings with Friends and Family: More than three times a week    Attends Religious Services: Patient declined    Database Administrator or Organizations: No    Attends Engineer, Structural: Not on file    Marital Status: Divorced  Intimate Partner Violence: Not At Risk (12/17/2023)   Humiliation, Afraid, Rape, and Kick questionnaire    Fear of Current or Ex-Partner: No    Emotionally Abused: No    Physically Abused: No    Sexually Abused: No    FAMILY HISTORY: Family History  Problem Relation Age of Onset   Hypertension Father    Stroke Father    Hypertension Sister  Dystonia Sister     ALLERGIES:  is allergic to codeine, nsaids, diclofenac sodium, and methocarbamol.  MEDICATIONS:  Current Outpatient Medications  Medication Sig Dispense Refill   acetaminophen  (TYLENOL ) 650 MG CR tablet Take 650 mg by mouth every 8 (eight) hours as needed for pain or fever.     apixaban  (ELIQUIS ) 5 MG TABS tablet Take 5 mg by mouth 2 (two) times daily.     APPLE CIDER VINEGAR PO Take 2 capsules by mouth daily.     ARIPiprazole  (ABILIFY ) 5 MG tablet Take 5 mg by mouth daily.     atorvastatin  (LIPITOR) 10 MG tablet Take 10 mg by mouth daily.     cetirizine (ZYRTEC) 10 MG tablet Take 10 mg by mouth daily.     desvenlafaxine (PRISTIQ) 50 MG 24 hr tablet Take 50 mg by mouth daily.     Fezolinetant  (VEOZAH ) 45 MG TABS Take 1 each by mouth daily at 10 pm.     hydrOXYzine  (ATARAX ) 50 MG tablet Take 50 mg by mouth 2 (two) times daily as needed for anxiety.     losartan  (COZAAR ) 50 MG tablet TAKE 1 Tablet BY MOUTH ONCE EVERY DAY 90 tablet 1   metFORMIN  (GLUCOPHAGE ) 500 MG tablet Take 500 mg by mouth daily with breakfast.     omeprazole  (PRILOSEC) 40 MG  capsule TAKE 1 Capsule BY MOUTH ONCE EVERY DAY 90 capsule 1   pregabalin  (LYRICA ) 150 MG capsule Take 150 mg by mouth 2 (two) times daily.     tiZANidine  (ZANAFLEX ) 2 MG tablet Take 2 mg by mouth at bedtime.     traZODone  (DESYREL ) 50 MG tablet Take 50 mg by mouth at bedtime.     No current facility-administered medications for this visit.    REVIEW OF SYSTEMS:   Constitutional: Denies fevers, chills or abnormal night sweats Eyes: Denies blurriness of vision, double vision or watery eyes Ears, nose, mouth, throat, and face: Denies mucositis or sore throat Respiratory: Denies cough, dyspnea or wheezes Cardiovascular: Denies palpitation, chest discomfort or lower extremity swelling Gastrointestinal:  Denies nausea, heartburn or change in bowel habits Skin: Denies abnormal skin rashes Lymphatics: Denies new lymphadenopathy or easy bruising Neurological:Denies numbness, tingling or new weaknesses Behavioral/Psych: Mood is stable, no new changes  All other systems were reviewed with the patient and are negative.  PHYSICAL EXAMINATION: ECOG PERFORMANCE STATUS: 1 - Symptomatic but completely ambulatory  Vitals:   07/07/24 1256  BP: 115/73  Pulse: 78  Resp: 16  Temp: 97.8 F (36.6 C)  SpO2: 96%   Filed Weights   07/07/24 1256  Weight: 189 lb 2.5 oz (85.8 kg)    GENERAL:alert, no distress and comfortable SKIN: skin color, texture, turgor are normal, no rashes or significant lesions EYES: normal, conjunctiva are pink and non-injected, sclera clear OROPHARYNX:no exudate, no erythema and lips, buccal mucosa, and tongue normal  NECK: supple, thyroid  normal size, non-tender, without nodularity LYMPH:  no palpable lymphadenopathy in the cervical, axillary or inguinal LUNGS: clear to auscultation and percussion with normal breathing effort HEART: regular rate & rhythm and no murmurs and no lower extremity edema ABDOMEN:abdomen soft, non-tender and normal bowel sounds Musculoskeletal:no  cyanosis of digits and no clubbing  PSYCH: alert & oriented x 3 with fluent speech NEURO: no focal motor/sensory deficits  Physical Exam VITALS: SaO2- 96%  LABORATORY DATA:  I have reviewed the data as listed    Latest Ref Rng & Units 12/18/2023    5:33 AM 12/17/2023  3:47 PM 02/12/2023    1:50 PM  CBC  WBC 4.0 - 10.5 K/uL 7.3  7.9  6.0   Hemoglobin 12.0 - 15.0 g/dL 88.7  88.3  87.3   Hematocrit 36.0 - 46.0 % 35.1  35.9  38.3   Platelets 150 - 400 K/uL 198  191  220       Latest Ref Rng & Units 12/18/2023    5:33 AM 12/17/2023    3:47 PM 02/12/2023    1:50 PM  CMP  Glucose 70 - 99 mg/dL 859  92  888   BUN 6 - 20 mg/dL 18  19  23    Creatinine 0.44 - 1.00 mg/dL 9.01  8.99  8.80   Sodium 135 - 145 mmol/L 139  137  139   Potassium 3.5 - 5.1 mmol/L 3.8  4.5  4.1   Chloride 98 - 111 mmol/L 106  104  105   CO2 22 - 32 mmol/L 24  24  25    Calcium  8.9 - 10.3 mg/dL 8.8  9.6  9.3   Total Protein 6.5 - 8.1 g/dL  7.2  6.9   Total Bilirubin 0.0 - 1.2 mg/dL  0.8  0.6   Alkaline Phos 38 - 126 U/L  110  111   AST 15 - 41 U/L  19  26   ALT 0 - 44 U/L  19  29      RADIOGRAPHIC STUDIES: I have personally reviewed the radiological images as listed and agreed with the findings in the report. No results found.   Assessment & Plan Evaluation for possible left lower extremity deep vein thrombosis and history of venous thromboembolism (pulmonary embolism and deep vein thrombosis) in 11/2023  Reports new onset left leg pain and swelling, similar to previous right leg DVT. History of pulmonary embolism and right leg extensive DVT in April, with symptoms starting in February. Possible contributing factors include recent shoulder surgery and sedentary lifestyle. Currently on Eliquis , though occasionally misses doses. No family history of blood clots. If Doppler is negative, will consider discontinuing Eliquis  and will order genetic testing for clotting disorders. If genetic testing is positive,  lifelong anticoagulation may be necessary. - Ordered Doppler ultrasound of the left leg to evaluate for DVT. - If Doppler is negative, will discontinue Eliquis  and will order genetic testing for clotting disorders. - If genetic testing is positive, will consider lifelong anticoagulation. - Advised on the importance of adherence to Eliquis  and the risks of bleeding. - Encouraged increased physical activity to prevent future clots.  Chronic right lower extremity edema Persistent swelling in the right leg since initial DVT diagnosis in April. No new interventions discussed as focus is on left leg evaluation. - Continue to monitor right leg swelling and symptoms.  Plan - I ordered Doppler ultrasound of bilateral lower extremity, to rule out new DVT on the left, and evaluate her previous right lower extremity DVT. - I will call her with results, if her Doppler is positive, she will stay on anticoagulation indefinitely.  If negative, then will let her come off Eliquis  for 2 weeks then obtain hypercoagulopathy workup.   Orders Placed This Encounter  Procedures   US  Venous Img Lower Bilateral (DVT)    History of RLL DVT, new onset left calf pain and edema, rule out DVT    Standing Status:   Future    Expected Date:   07/07/2024    Expiration Date:   07/07/2025    Reason for Exam (  SYMPTOM  OR DIAGNOSIS REQUIRED):   leg edema    Preferred imaging location?:   [facility]    All questions were answered. The patient knows to call the clinic with any problems, questions or concerns. I spent 25 minutes counseling the patient face to face. The total time spent in the appointment was 30 minutes including review of chart and various tests results, discussions about plan of care and coordination of care plan.     Onita Mattock, MD 07/07/2024 1:26 PM

## 2024-07-18 ENCOUNTER — Ambulatory Visit: Payer: Self-pay | Admitting: Hematology

## 2024-07-18 NOTE — Addendum Note (Signed)
 Addended by: LANNY CALLANDER on: 07/18/2024 02:21 PM   Modules accepted: Orders

## 2024-07-31 NOTE — Telephone Encounter (Signed)
 Lft another voicemail message stating that Dr Lanny had contacted the pt about stopping her Eliquis  for 3wks.  Requested if pt would give Dr Lanny office a call back confirming she received this nurse's messages as well as Dr Demetra message.  Awaiting pt's response.

## 2024-07-31 NOTE — Telephone Encounter (Signed)
-----   Message from Onita Mattock sent at 07/18/2024  2:47 PM EST ----- Anders,  I called pt and left her a VM regarding negative result. I would like her to stop Eliquis  and do lab in 3 weeks. Lab orders are in. I saw her in [facility] on 11/14. Please make sure she knows.   Amy/Danielle, please schedule lab in 3 weeks and f/u with NP at [facility] 2 weeks after, thanks   Onita Mattock  ----- Message ----- From: Rebecka, Rad Results In Sent: 07/07/2024   3:03 PM EST To: Onita Mattock, MD

## 2024-08-10 ENCOUNTER — Inpatient Hospital Stay: Payer: MEDICAID | Attending: Hematology

## 2024-08-10 DIAGNOSIS — Z79899 Other long term (current) drug therapy: Secondary | ICD-10-CM | POA: Insufficient documentation

## 2024-08-10 DIAGNOSIS — I82411 Acute embolism and thrombosis of right femoral vein: Secondary | ICD-10-CM | POA: Insufficient documentation

## 2024-08-21 ENCOUNTER — Encounter: Payer: Self-pay | Admitting: *Deleted

## 2024-08-22 ENCOUNTER — Inpatient Hospital Stay: Payer: MEDICAID

## 2024-08-23 ENCOUNTER — Inpatient Hospital Stay: Payer: MEDICAID

## 2024-08-23 DIAGNOSIS — Z86718 Personal history of other venous thrombosis and embolism: Secondary | ICD-10-CM

## 2024-08-23 DIAGNOSIS — I82411 Acute embolism and thrombosis of right femoral vein: Secondary | ICD-10-CM | POA: Diagnosis present

## 2024-08-23 DIAGNOSIS — Z79899 Other long term (current) drug therapy: Secondary | ICD-10-CM | POA: Diagnosis not present

## 2024-08-23 LAB — ANTITHROMBIN III: AntiThromb III Func: 121 % — ABNORMAL HIGH (ref 75–120)

## 2024-08-24 LAB — BETA-2-GLYCOPROTEIN I ABS, IGG/M/A
Beta-2 Glyco I IgG: <9 GPI IgG units (ref 0–20)
Beta-2-Glycoprotein I IgA: <9 GPI IgA units (ref 0–25)
Beta-2-Glycoprotein I IgM: <9 GPI IgM units (ref 0–32)

## 2024-08-25 LAB — LUPUS ANTICOAGULANT PANEL
DRVVT: 64.1 s — ABNORMAL HIGH (ref 0.0–47.0)
PTT Lupus Anticoagulant: 38.7 s (ref 0.0–43.5)

## 2024-08-25 LAB — DRVVT MIX: dRVVT Mix: 56.8 s — ABNORMAL HIGH (ref 0.0–40.4)

## 2024-08-25 LAB — PROTEIN S, TOTAL: Protein S Ag, Total: 118 % (ref 60–150)

## 2024-08-25 LAB — DRVVT CONFIRM: dRVVT Confirm: 1 ratio (ref 0.8–1.2)

## 2024-08-25 LAB — HOMOCYSTEINE: Homocysteine: 10.3 umol/L (ref 0.0–14.5)

## 2024-08-25 LAB — CARDIOLIPIN ANTIBODIES, IGG, IGM, IGA
Anticardiolipin IgA: <9 U/mL (ref 0–11)
Anticardiolipin IgG: <9 GPL U/mL (ref 0–14)
Anticardiolipin IgM: 16 [MPL'U]/mL — ABNORMAL HIGH (ref 0–12)

## 2024-08-25 LAB — PROTEIN S ACTIVITY: Protein S Activity: 96 % (ref 63–140)

## 2024-08-25 LAB — PROTEIN C ACTIVITY: Protein C Activity: 151 % (ref 73–180)

## 2024-08-29 LAB — PROTHROMBIN GENE MUTATION

## 2024-08-29 LAB — PROTEIN C, TOTAL: Protein C, Total: 141 % (ref 60–150)

## 2024-08-30 LAB — FACTOR 5 LEIDEN

## 2024-08-31 ENCOUNTER — Inpatient Hospital Stay: Payer: MEDICAID | Attending: Hematology | Admitting: Oncology

## 2024-09-27 ENCOUNTER — Inpatient Hospital Stay: Payer: MEDICAID | Attending: Hematology | Admitting: Oncology

## 2024-09-27 NOTE — Progress Notes (Unsigned)
 " [facility]   Telephone:([phone]) [phone] Fax:([phone]   Clinic New Consult Note   Patient Care Team: Teresa Jenkins Jansky, FNP as PCP - General (Family Medicine) Okey Vina GAILS, MD as PCP - Cardiology (Cardiology) 09/27/2024  CHIEF COMPLAINTS/PURPOSE OF CONSULTATION:  PE and DVT  REFERRING PHYSICIAN: DVT clinic   Discussed the use of AI scribe software for clinical note transcription with the patient, who gave verbal consent to proceed.  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 She was referred by the [facility] clinic pharmacist for evaluation of her blood clots.  In April 2025, she was diagnosed with blood clots in her right leg after experiencing swelling and pain.  Her symptoms started 1 to 2 months before, and she had right shoulder surgery in January 2025.  Her DVT was quite extensive, involving the femoral, popliteal and calf vein of the right lower extremity and only was occlusive.  CTA also showed bilateral lower lobe segmental and subsegmental PE.  She was put on Eliquis , and has been tolerating well.  She does miss the dose 1-2 times a week. currently, she has similar symptoms in her left leg, including pain and swelling, raising suspicion of another clot.  She underwent shoulder surgery in January, after which she was mobile but experienced increased sleepiness. She has been on Eliquis  for almost six months but misses doses approximately once a week. She has a history of pulmonary embolism and confirmed clots in her right leg veins.  Her medical history includes fibromyalgia, hypertension, thyroid  disorder, kidney disease, prediabetes, restless leg syndrome, sleep apnea, Barrett's esophagus, lung nodules, and COPD. She is disabled due to PTSD and depression. Her medications include metformin , atorvastatin , Lyrica , losartan , omeprazole , Zanaflex , Abilify , Pristiq, Veozah , trazodone ,  and hydrocortisone .  At her last visit with us  in November, she was complaining of left lower extremity pain and swelling prompting imaging given she misses Eliquis  doses 1-2 times per week.  Bilateral Dopplers on 07/07/2024 were negative for DVT in either extremity.  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   Depression    major   Dyspnea    Fatty liver    Fibromyalgia    Headache    Hypertension    Hypothyroidism    Kidney disease, chronic, stage III (moderate, EGFR 30-59 ml/min) (HCC)    Movement disorder    Pre-diabetes    PTSD (post-traumatic stress disorder)    Restless leg syndrome    Sciatica of right side    Sleep apnea    mild, pt states she is awaiting insurance approval for CPAP   Spondylosis    Cervical and lumbar   Substance abuse (HCC)    Hx Crack/cocaine (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)   Tortuous colon     SURGICAL HISTORY: Past Surgical History:  Procedure Laterality Date   ANTERIOR CERVICAL DECOMP/DISCECTOMY FUSION N/A 02/23/2023   Procedure: Anterior Cervical Decompression/Discectomy Fusion, Cervical Four-Cervical Five, Cervical Five-Cervical Six, Cervical Six-Cervical Cervical Cervical Seven;  Surgeon: Lanis Pupa, MD;  Location: [facility] OR;  Service: Neurosurgery;  Laterality: N/A;  3C  BIOPSY  04/29/2022   Procedure: BIOPSY;  Surgeon: Eartha Angelia Sieving, MD;  Location: [facility];  Service: Gastroenterology;;   CARPAL TUNNEL RELEASE Bilateral    COLONOSCOPY WITH PROPOFOL  N/A 04/29/2022   Procedure: COLONOSCOPY WITH PROPOFOL ;  Surgeon: Eartha Angelia Sieving, MD;  Location: [facility];  Service: Gastroenterology;  Laterality: N/A;  145   ECTOPIC PREGNANCY SURGERY      ESOPHAGOGASTRODUODENOSCOPY (EGD) WITH PROPOFOL  N/A 04/29/2022   Procedure: ESOPHAGOGASTRODUODENOSCOPY (EGD) WITH PROPOFOL ;  Surgeon: Eartha Angelia Sieving, MD;  Location: [facility];  Service: Gastroenterology;  Laterality: N/A;   SHOULDER SURGERY Right    rotator cuff repair- fell 6 months later and had to have 2nd shoulder surgery   SHOULDER SURGERY Right    2nd surgery due to injury 6 months after 1st shoulder surgery   TUBAL LIGATION     ULNAR NERVE TRANSPOSITION Right 12/16/2022   Procedure: ULNAR NERVE RELEASE AT ELBOW;  Surgeon: Lanis Pupa, MD;  Location: [facility] OR;  Service: Neurosurgery;  Laterality: Right;    SOCIAL HISTORY: Social History   Socioeconomic History   Marital status: Single    Spouse name: Not on file   Number of children: 3   Years of education: Not on file   Highest education level: GED or equivalent  Occupational History   Not on file  Tobacco Use   Smoking status: Former    Current packs/day: 0.00    Types: Cigarettes    Quit date: 08/25/1995    Years since quitting: 29.1    Passive exposure: Past   Smokeless tobacco: Never  Vaping Use   Vaping status: Never Used  Substance and Sexual Activity   Alcohol use: No   Drug use: Yes    Types: Crack cocaine    Comment: last used 08/2022   Sexual activity: Not Currently    Birth control/protection: Surgical  Other Topics Concern   Not on file  Social History Narrative   Not on file   Social Drivers of Health   Tobacco Use: Medium Risk (07/07/2024)   Patient History    Smoking Tobacco Use: Former    Smokeless Tobacco Use: Never    Passive Exposure: Past  Physicist, Medical Strain: Low Risk (11/16/2022)   Overall Financial Resource Strain (CARDIA)    Difficulty of Paying Living Expenses: Not very hard  Food Insecurity: No Food Insecurity (07/07/2024)   Epic    Worried About Programme Researcher, Broadcasting/film/video in the Last Year: Never true    Ran Out of Food in the Last Year: Never true   Transportation Needs: No Transportation Needs (07/07/2024)   Epic    Lack of Transportation (Medical): No    Lack of Transportation (Non-Medical): No  Physical Activity: Insufficiently Active (11/16/2022)   Exercise Vital Sign    Days of Exercise per Week: 3 days    Minutes of Exercise per Session: 20 min  Stress: Stress Concern Present (11/16/2022)   [employer] of Occupational Health - Occupational Stress Questionnaire    Feeling of Stress : Very much  Social Connections: Unknown (11/16/2022)   Social Connection and Isolation Panel    Frequency of Communication with Friends and Family: More than three times a week    Frequency of Social Gatherings with Friends and Family: More than three times a week    Attends Religious Services: Patient declined    Database Administrator or Organizations: No    Attends Banker Meetings: Not on file  Marital Status: Divorced  Catering Manager Violence: Not At Risk (12/17/2023)   Humiliation, Afraid, Rape, and Kick questionnaire    Fear of Current or Ex-Partner: No    Emotionally Abused: No    Physically Abused: No    Sexually Abused: No  Depression (PHQ2-9): High Risk (07/07/2024)   Depression (PHQ2-9)    PHQ-2 Score: 11  Alcohol Screen: Low Risk (08/10/2022)   Alcohol Screen    Last Alcohol Screening Score (AUDIT): 0  Housing: Unknown (07/07/2024)   Epic    Unable to Pay for Housing in the Last Year: No    Number of Times Moved in the Last Year: Not on file    Homeless in the Last Year: No  Utilities: Not At Risk (07/07/2024)   Epic    Threatened with loss of utilities: No  Health Literacy: Not on file    FAMILY HISTORY: Family History  Problem Relation Age of Onset   Hypertension Father    Stroke Father    Hypertension Sister    Dystonia Sister     ALLERGIES:  is allergic to codeine, nsaids, diclofenac sodium, and methocarbamol.  MEDICATIONS:  Current Outpatient Medications  Medication Sig Dispense Refill    acetaminophen  (TYLENOL ) 650 MG CR tablet Take 650 mg by mouth every 8 (eight) hours as needed for pain or fever.     apixaban  (ELIQUIS ) 5 MG TABS tablet Take 5 mg by mouth 2 (two) times daily.     APPLE CIDER VINEGAR PO Take 2 capsules by mouth daily.     ARIPiprazole  (ABILIFY ) 5 MG tablet Take 5 mg by mouth daily.     atorvastatin  (LIPITOR) 10 MG tablet Take 10 mg by mouth daily.     cetirizine (ZYRTEC) 10 MG tablet Take 10 mg by mouth daily.     desvenlafaxine (PRISTIQ) 50 MG 24 hr tablet Take 50 mg by mouth daily.     Fezolinetant  (VEOZAH ) 45 MG TABS Take 1 each by mouth daily at 10 pm.     hydrOXYzine  (ATARAX ) 50 MG tablet Take 50 mg by mouth 2 (two) times daily as needed for anxiety.     losartan  (COZAAR ) 50 MG tablet TAKE 1 Tablet BY MOUTH ONCE EVERY DAY 90 tablet 1   metFORMIN  (GLUCOPHAGE ) 500 MG tablet Take 500 mg by mouth daily with breakfast.     omeprazole  (PRILOSEC) 40 MG capsule TAKE 1 Capsule BY MOUTH ONCE EVERY DAY 90 capsule 1   pregabalin  (LYRICA ) 150 MG capsule Take 150 mg by mouth 2 (two) times daily.     tiZANidine  (ZANAFLEX ) 2 MG tablet Take 2 mg by mouth at bedtime.     traZODone  (DESYREL ) 50 MG tablet Take 50 mg by mouth at bedtime.     No current facility-administered medications for this visit.    REVIEW OF SYSTEMS:   ROS   PHYSICAL EXAMINATION: ECOG PERFORMANCE STATUS: 1 - Symptomatic but completely ambulatory  There were no vitals filed for this visit.  There were no vitals filed for this visit.   Physical Exam   Physical Exam VITALS: SaO2- 96%  LABORATORY DATA:  I have reviewed the data as listed    Latest Ref Rng & Units 12/18/2023    5:33 AM 12/17/2023    3:47 PM 02/12/2023    1:50 PM  CBC  WBC 4.0 - 10.5 K/uL 7.3  7.9  6.0   Hemoglobin 12.0 - 15.0 g/dL 88.7  88.3  87.3   Hematocrit 36.0 - 46.0 % 35.1  35.9  38.3   Platelets 150 - 400 K/uL 198  191  220       Latest Ref Rng & Units 12/18/2023    5:33 AM 12/17/2023    3:47 PM 02/12/2023     1:50 PM  CMP  Glucose 70 - 99 mg/dL 859  92  888   BUN 6 - 20 mg/dL 18  19  23    Creatinine 0.44 - 1.00 mg/dL 9.01  8.99  8.80   Sodium 135 - 145 mmol/L 139  137  139   Potassium 3.5 - 5.1 mmol/L 3.8  4.5  4.1   Chloride 98 - 111 mmol/L 106  104  105   CO2 22 - 32 mmol/L 24  24  25    Calcium  8.9 - 10.3 mg/dL 8.8  9.6  9.3   Total Protein 6.5 - 8.1 g/dL  7.2  6.9   Total Bilirubin 0.0 - 1.2 mg/dL  0.8  0.6   Alkaline Phos 38 - 126 U/L  110  111   AST 15 - 41 U/L  19  26   ALT 0 - 44 U/L  19  29      RADIOGRAPHIC STUDIES: I have personally reviewed the radiological images as listed and agreed with the findings in the report. No results found.   Assessment & Plan Evaluation for possible left lower extremity deep vein thrombosis and history of venous thromboembolism (pulmonary embolism and deep vein thrombosis) in 11/2023  History of pulmonary embolism and right leg extensive DVT in April, with symptoms starting in February. Possible contributing factors include recent shoulder surgery and sedentary lifestyle. Currently on Eliquis , though occasionally misses doses. No family history of blood clots. Patient had a Doppler of bilateral lower extremities on 07/07/2024 which was negative. Per Dr. Ginger previous note if Doppler is negative we will consider discontinuing Eliquis  and ordered genetic testing for clotting disorders.  If genetic testing is positive, lifelong anticoagulation may be necessary.  -Reviewed genetic testing from 08/23/2024. - Based on results, patient can come off of Eliquis  as there is no genetic component. - Advised on the importance of adherence to Eliquis  and the risks of bleeding. - Encouraged increased physical activity to prevent future clots.  Chronic right lower extremity edema Persistent swelling in the right leg since initial DVT diagnosis in April. No new interventions discussed as focus is on left leg evaluation. - Continue to monitor right leg  swelling and symptoms.     No orders of the defined types were placed in this encounter.   All questions were answered. The patient knows to call the clinic with any problems, questions or concerns. I spent 25 minutes counseling the patient face to face. The total time spent in the appointment was 30 minutes including review of chart and various tests results, discussions about plan of care and coordination of care plan.     Delon FORBES Hope, NP 09/27/2024 8:40 AM    "
